# Patient Record
Sex: Female | Born: 1985
Health system: Southern US, Community
[De-identification: ages and names within clinical notes are randomized; demographics above are authoritative.]

## PROBLEM LIST (undated history)

## (undated) ENCOUNTER — Inpatient Hospital Stay (HOSPITAL_COMMUNITY): Payer: Self-pay

## (undated) DIAGNOSIS — F331 Major depressive disorder, recurrent, moderate: Secondary | ICD-10-CM

## (undated) DIAGNOSIS — B009 Herpesviral infection, unspecified: Secondary | ICD-10-CM

## (undated) DIAGNOSIS — Z87898 Personal history of other specified conditions: Secondary | ICD-10-CM

## (undated) DIAGNOSIS — I10 Essential (primary) hypertension: Secondary | ICD-10-CM

## (undated) DIAGNOSIS — N83209 Unspecified ovarian cyst, unspecified side: Secondary | ICD-10-CM

## (undated) DIAGNOSIS — I5189 Other ill-defined heart diseases: Secondary | ICD-10-CM

## (undated) DIAGNOSIS — R51 Headache: Secondary | ICD-10-CM

## (undated) DIAGNOSIS — N390 Urinary tract infection, site not specified: Secondary | ICD-10-CM

## (undated) DIAGNOSIS — B999 Unspecified infectious disease: Secondary | ICD-10-CM

## (undated) DIAGNOSIS — F4310 Post-traumatic stress disorder, unspecified: Secondary | ICD-10-CM

## (undated) DIAGNOSIS — R87619 Unspecified abnormal cytological findings in specimens from cervix uteri: Secondary | ICD-10-CM

## (undated) DIAGNOSIS — Z8751 Personal history of pre-term labor: Secondary | ICD-10-CM

## (undated) DIAGNOSIS — IMO0002 Reserved for concepts with insufficient information to code with codable children: Secondary | ICD-10-CM

## (undated) HISTORY — DX: Major depressive disorder, recurrent, moderate: F33.1

## (undated) HISTORY — DX: Personal history of pre-term labor: Z87.51

## (undated) HISTORY — DX: Personal history of other specified conditions: Z87.898

## (undated) HISTORY — PX: WISDOM TOOTH EXTRACTION: SHX21

## (undated) HISTORY — DX: Essential (primary) hypertension: I10

## (undated) HISTORY — PX: TONSILLECTOMY: SUR1361

---
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
## 2007-01-04 ENCOUNTER — Ambulatory Visit: Payer: Self-pay | Admitting: Sports Medicine

## 2007-01-28 ENCOUNTER — Inpatient Hospital Stay (HOSPITAL_COMMUNITY): Admission: AD | Admit: 2007-01-28 | Discharge: 2007-01-28 | Payer: Self-pay | Admitting: Obstetrics and Gynecology

## 2007-01-28 DIAGNOSIS — F4310 Post-traumatic stress disorder, unspecified: Secondary | ICD-10-CM | POA: Insufficient documentation

## 2007-01-28 DIAGNOSIS — F909 Attention-deficit hyperactivity disorder, unspecified type: Secondary | ICD-10-CM | POA: Insufficient documentation

## 2007-01-28 DIAGNOSIS — O99210 Obesity complicating pregnancy, unspecified trimester: Secondary | ICD-10-CM

## 2007-01-28 HISTORY — DX: Post-traumatic stress disorder, unspecified: F43.10

## 2007-03-26 ENCOUNTER — Encounter (INDEPENDENT_AMBULATORY_CARE_PROVIDER_SITE_OTHER): Payer: Self-pay | Admitting: Family Medicine

## 2007-03-26 ENCOUNTER — Other Ambulatory Visit: Admission: RE | Admit: 2007-03-26 | Discharge: 2007-03-26 | Payer: Self-pay | Admitting: Family Medicine

## 2007-03-26 ENCOUNTER — Ambulatory Visit: Payer: Self-pay | Admitting: Family Medicine

## 2007-03-26 LAB — CONVERTED CEMR LAB
Pap Smear: NEGATIVE
Whiff Test: NEGATIVE

## 2007-03-27 ENCOUNTER — Encounter (INDEPENDENT_AMBULATORY_CARE_PROVIDER_SITE_OTHER): Payer: Self-pay | Admitting: Family Medicine

## 2007-04-03 ENCOUNTER — Encounter (INDEPENDENT_AMBULATORY_CARE_PROVIDER_SITE_OTHER): Payer: Self-pay | Admitting: Family Medicine

## 2007-05-26 ENCOUNTER — Ambulatory Visit: Payer: Self-pay | Admitting: Family Medicine

## 2007-06-10 ENCOUNTER — Telehealth: Payer: Self-pay | Admitting: *Deleted

## 2007-06-11 ENCOUNTER — Encounter (INDEPENDENT_AMBULATORY_CARE_PROVIDER_SITE_OTHER): Payer: Self-pay | Admitting: Family Medicine

## 2007-06-11 ENCOUNTER — Ambulatory Visit: Payer: Self-pay | Admitting: Family Medicine

## 2007-06-11 LAB — CONVERTED CEMR LAB
Bilirubin Urine: NEGATIVE
Glucose, Urine, Semiquant: NEGATIVE
Urobilinogen, UA: 0.2
Whiff Test: NEGATIVE

## 2007-06-14 ENCOUNTER — Encounter (INDEPENDENT_AMBULATORY_CARE_PROVIDER_SITE_OTHER): Payer: Self-pay | Admitting: Family Medicine

## 2007-09-08 ENCOUNTER — Telehealth (INDEPENDENT_AMBULATORY_CARE_PROVIDER_SITE_OTHER): Payer: Self-pay | Admitting: *Deleted

## 2007-09-09 ENCOUNTER — Encounter (INDEPENDENT_AMBULATORY_CARE_PROVIDER_SITE_OTHER): Payer: Self-pay | Admitting: Family Medicine

## 2007-09-09 ENCOUNTER — Ambulatory Visit: Payer: Self-pay | Admitting: Family Medicine

## 2007-09-09 ENCOUNTER — Telehealth: Payer: Self-pay | Admitting: *Deleted

## 2007-09-09 ENCOUNTER — Encounter (INDEPENDENT_AMBULATORY_CARE_PROVIDER_SITE_OTHER): Payer: Self-pay | Admitting: *Deleted

## 2007-09-09 LAB — CONVERTED CEMR LAB: KOH Prep: NEGATIVE

## 2007-09-11 LAB — CONVERTED CEMR LAB

## 2007-09-13 ENCOUNTER — Telehealth (INDEPENDENT_AMBULATORY_CARE_PROVIDER_SITE_OTHER): Payer: Self-pay | Admitting: *Deleted

## 2007-09-13 ENCOUNTER — Ambulatory Visit: Payer: Self-pay | Admitting: Family Medicine

## 2007-10-25 ENCOUNTER — Ambulatory Visit: Payer: Self-pay | Admitting: Family Medicine

## 2007-10-25 LAB — CONVERTED CEMR LAB: Beta hcg, urine, semiquantitative: NEGATIVE

## 2007-10-26 ENCOUNTER — Encounter (INDEPENDENT_AMBULATORY_CARE_PROVIDER_SITE_OTHER): Payer: Self-pay | Admitting: Family Medicine

## 2007-10-26 ENCOUNTER — Ambulatory Visit: Payer: Self-pay | Admitting: Family Medicine

## 2007-10-26 LAB — CONVERTED CEMR LAB: GC Probe Amp, Genital: NEGATIVE

## 2007-10-27 ENCOUNTER — Telehealth: Payer: Self-pay | Admitting: *Deleted

## 2007-11-01 ENCOUNTER — Encounter (INDEPENDENT_AMBULATORY_CARE_PROVIDER_SITE_OTHER): Payer: Self-pay | Admitting: Family Medicine

## 2007-11-03 ENCOUNTER — Encounter: Payer: Self-pay | Admitting: *Deleted

## 2007-11-16 ENCOUNTER — Inpatient Hospital Stay (HOSPITAL_COMMUNITY): Admission: AD | Admit: 2007-11-16 | Discharge: 2007-11-16 | Payer: Self-pay | Admitting: Obstetrics & Gynecology

## 2008-02-08 ENCOUNTER — Ambulatory Visit: Payer: Self-pay | Admitting: Family Medicine

## 2008-02-08 ENCOUNTER — Encounter (INDEPENDENT_AMBULATORY_CARE_PROVIDER_SITE_OTHER): Payer: Self-pay | Admitting: Family Medicine

## 2008-02-08 LAB — CONVERTED CEMR LAB
Beta hcg, urine, semiquantitative: NEGATIVE
Whiff Test: POSITIVE

## 2008-02-09 ENCOUNTER — Ambulatory Visit: Payer: Self-pay | Admitting: Family Medicine

## 2008-02-09 LAB — CONVERTED CEMR LAB
Chlamydia, DNA Probe: POSITIVE — AB
GC Probe Amp, Genital: POSITIVE — AB

## 2008-03-06 ENCOUNTER — Encounter: Payer: Self-pay | Admitting: *Deleted

## 2008-03-06 ENCOUNTER — Ambulatory Visit: Payer: Self-pay | Admitting: Family Medicine

## 2008-03-27 ENCOUNTER — Encounter (INDEPENDENT_AMBULATORY_CARE_PROVIDER_SITE_OTHER): Payer: Self-pay | Admitting: Family Medicine

## 2008-03-27 ENCOUNTER — Other Ambulatory Visit: Admission: RE | Admit: 2008-03-27 | Discharge: 2008-03-27 | Payer: Self-pay | Admitting: Family Medicine

## 2008-03-27 ENCOUNTER — Ambulatory Visit: Payer: Self-pay | Admitting: Family Medicine

## 2008-03-27 LAB — CONVERTED CEMR LAB: GC Probe Amp, Genital: NEGATIVE

## 2008-03-30 LAB — CONVERTED CEMR LAB: Pap Smear: NORMAL

## 2008-04-04 ENCOUNTER — Encounter (INDEPENDENT_AMBULATORY_CARE_PROVIDER_SITE_OTHER): Payer: Self-pay | Admitting: Family Medicine

## 2008-04-27 ENCOUNTER — Ambulatory Visit: Payer: Self-pay | Admitting: Family Medicine

## 2008-05-01 ENCOUNTER — Ambulatory Visit: Payer: Self-pay | Admitting: Family Medicine

## 2008-05-08 ENCOUNTER — Encounter (INDEPENDENT_AMBULATORY_CARE_PROVIDER_SITE_OTHER): Payer: Self-pay | Admitting: *Deleted

## 2008-05-19 ENCOUNTER — Ambulatory Visit: Payer: Self-pay | Admitting: Family Medicine

## 2008-05-19 ENCOUNTER — Encounter (INDEPENDENT_AMBULATORY_CARE_PROVIDER_SITE_OTHER): Payer: Self-pay | Admitting: *Deleted

## 2008-05-19 ENCOUNTER — Telehealth: Payer: Self-pay | Admitting: *Deleted

## 2008-05-19 ENCOUNTER — Encounter: Payer: Self-pay | Admitting: Family Medicine

## 2008-06-02 ENCOUNTER — Encounter: Payer: Self-pay | Admitting: Family Medicine

## 2008-06-07 ENCOUNTER — Inpatient Hospital Stay (HOSPITAL_COMMUNITY): Admission: AD | Admit: 2008-06-07 | Discharge: 2008-06-07 | Payer: Self-pay | Admitting: Obstetrics & Gynecology

## 2008-06-08 ENCOUNTER — Encounter (INDEPENDENT_AMBULATORY_CARE_PROVIDER_SITE_OTHER): Payer: Self-pay | Admitting: Family Medicine

## 2008-06-08 ENCOUNTER — Ambulatory Visit: Payer: Self-pay | Admitting: Family Medicine

## 2008-06-08 LAB — CONVERTED CEMR LAB: hCG, Beta Chain, Quant, S: <2 milliintl units/mL

## 2008-06-09 ENCOUNTER — Encounter (INDEPENDENT_AMBULATORY_CARE_PROVIDER_SITE_OTHER): Payer: Self-pay | Admitting: Family Medicine

## 2008-07-03 ENCOUNTER — Encounter: Payer: Self-pay | Admitting: Family Medicine

## 2008-07-03 ENCOUNTER — Ambulatory Visit: Payer: Self-pay | Admitting: Family Medicine

## 2008-07-03 LAB — CONVERTED CEMR LAB
Glucose, Urine, Semiquant: NEGATIVE
Nitrite: NEGATIVE
Urobilinogen, UA: 0.2

## 2008-07-04 ENCOUNTER — Encounter: Payer: Self-pay | Admitting: Family Medicine

## 2008-08-22 ENCOUNTER — Ambulatory Visit: Payer: Self-pay | Admitting: Family Medicine

## 2008-08-22 LAB — CONVERTED CEMR LAB
Bilirubin Urine: NEGATIVE
Ketones, urine, test strip: NEGATIVE
Nitrite: NEGATIVE
Protein, U semiquant: NEGATIVE
pH: 6.5

## 2008-09-06 ENCOUNTER — Telehealth (INDEPENDENT_AMBULATORY_CARE_PROVIDER_SITE_OTHER): Payer: Self-pay | Admitting: *Deleted

## 2008-09-08 ENCOUNTER — Telehealth (INDEPENDENT_AMBULATORY_CARE_PROVIDER_SITE_OTHER): Payer: Self-pay | Admitting: *Deleted

## 2008-09-08 ENCOUNTER — Encounter: Payer: Self-pay | Admitting: *Deleted

## 2008-09-11 ENCOUNTER — Encounter: Payer: Self-pay | Admitting: Family Medicine

## 2008-09-11 ENCOUNTER — Ambulatory Visit: Payer: Self-pay | Admitting: Family Medicine

## 2008-09-11 LAB — CONVERTED CEMR LAB
Beta hcg, urine, semiquantitative: NEGATIVE
Blood in Urine, dipstick: NEGATIVE
Glucose, Urine, Semiquant: NEGATIVE
Nitrite: NEGATIVE
Specific Gravity, Urine: 1.02
WBC Urine, dipstick: NEGATIVE

## 2008-10-02 ENCOUNTER — Encounter: Payer: Self-pay | Admitting: Family Medicine

## 2008-10-02 ENCOUNTER — Ambulatory Visit: Payer: Self-pay | Admitting: Family Medicine

## 2008-10-02 LAB — CONVERTED CEMR LAB

## 2008-10-31 ENCOUNTER — Ambulatory Visit: Payer: Self-pay | Admitting: Family Medicine

## 2008-10-31 ENCOUNTER — Encounter: Payer: Self-pay | Admitting: Family Medicine

## 2008-10-31 LAB — CONVERTED CEMR LAB
Beta hcg, urine, semiquantitative: NEGATIVE
Chlamydia, DNA Probe: NEGATIVE
GC Probe Amp, Genital: NEGATIVE

## 2008-11-02 ENCOUNTER — Encounter: Payer: Self-pay | Admitting: Family Medicine

## 2008-11-06 ENCOUNTER — Telehealth: Payer: Self-pay | Admitting: *Deleted

## 2008-11-06 ENCOUNTER — Ambulatory Visit: Payer: Self-pay | Admitting: Family Medicine

## 2008-12-06 ENCOUNTER — Telehealth: Payer: Self-pay | Admitting: *Deleted

## 2008-12-06 ENCOUNTER — Ambulatory Visit: Payer: Self-pay | Admitting: Family Medicine

## 2008-12-06 LAB — CONVERTED CEMR LAB
Glucose, Urine, Semiquant: NEGATIVE
Nitrite: NEGATIVE
WBC Urine, dipstick: NEGATIVE
pH: 6.5

## 2008-12-07 ENCOUNTER — Encounter: Payer: Self-pay | Admitting: Family Medicine

## 2009-01-18 ENCOUNTER — Encounter: Payer: Self-pay | Admitting: Family Medicine

## 2009-01-18 ENCOUNTER — Ambulatory Visit: Payer: Self-pay | Admitting: Family Medicine

## 2009-01-18 LAB — CONVERTED CEMR LAB
Basophils Relative: 0 % (ref 0–1)
Beta hcg, urine, semiquantitative: POSITIVE
Bilirubin Urine: NEGATIVE
Eosinophils Absolute: 0.1 10*3/uL (ref 0.0–0.7)
Eosinophils Relative: 1 % (ref 0–5)
HCT: 36.3 % (ref 36.0–46.0)
Hepatitis B Surface Ag: NEGATIVE
MCHC: 33.1 g/dL (ref 30.0–36.0)
MCV: 96.5 fL (ref 78.0–100.0)
Neutrophils Relative %: 57 % (ref 43–77)
Platelets: 424 10*3/uL — ABNORMAL HIGH (ref 150–400)
Protein, U semiquant: 100
RDW: 12.9 % (ref 11.5–15.5)
Rh Type: NEGATIVE
Urobilinogen, UA: 1
WBC, UA: >20 cells/hpf
pH: 6.5

## 2009-01-19 ENCOUNTER — Encounter: Payer: Self-pay | Admitting: Family Medicine

## 2009-01-19 ENCOUNTER — Inpatient Hospital Stay (HOSPITAL_COMMUNITY): Admission: AD | Admit: 2009-01-19 | Discharge: 2009-01-19 | Payer: Self-pay | Admitting: Obstetrics & Gynecology

## 2009-01-26 ENCOUNTER — Inpatient Hospital Stay (HOSPITAL_COMMUNITY): Admission: RE | Admit: 2009-01-26 | Discharge: 2009-01-26 | Payer: Self-pay | Admitting: Family Medicine

## 2009-02-05 ENCOUNTER — Ambulatory Visit: Payer: Self-pay | Admitting: Family Medicine

## 2009-02-05 ENCOUNTER — Encounter: Payer: Self-pay | Admitting: *Deleted

## 2009-02-05 ENCOUNTER — Telehealth: Payer: Self-pay | Admitting: Family Medicine

## 2009-02-06 ENCOUNTER — Encounter: Payer: Self-pay | Admitting: Family Medicine

## 2009-02-26 ENCOUNTER — Telehealth: Payer: Self-pay | Admitting: *Deleted

## 2009-03-02 ENCOUNTER — Encounter: Payer: Self-pay | Admitting: Family Medicine

## 2009-03-02 ENCOUNTER — Other Ambulatory Visit: Admission: RE | Admit: 2009-03-02 | Discharge: 2009-03-02 | Payer: Self-pay | Admitting: Family Medicine

## 2009-03-02 ENCOUNTER — Ambulatory Visit: Payer: Self-pay | Admitting: Family Medicine

## 2009-03-02 LAB — CONVERTED CEMR LAB
Cholesterol, target level: 200 mg/dL
GC Probe Amp, Genital: NEGATIVE
LDL Goal: 160 mg/dL

## 2009-03-06 LAB — CONVERTED CEMR LAB: Pap Smear: NORMAL

## 2009-03-07 ENCOUNTER — Telehealth: Payer: Self-pay | Admitting: Family Medicine

## 2009-03-21 ENCOUNTER — Telehealth: Payer: Self-pay | Admitting: Family Medicine

## 2009-04-02 ENCOUNTER — Ambulatory Visit: Payer: Self-pay | Admitting: Family Medicine

## 2009-04-02 DIAGNOSIS — O360990 Maternal care for other rhesus isoimmunization, unspecified trimester, not applicable or unspecified: Secondary | ICD-10-CM | POA: Insufficient documentation

## 2009-04-24 ENCOUNTER — Encounter: Payer: Self-pay | Admitting: Family Medicine

## 2009-04-24 ENCOUNTER — Ambulatory Visit (HOSPITAL_COMMUNITY): Admission: RE | Admit: 2009-04-24 | Discharge: 2009-04-24 | Payer: Self-pay | Admitting: Family Medicine

## 2009-05-04 ENCOUNTER — Encounter: Payer: Self-pay | Admitting: Family Medicine

## 2009-05-04 ENCOUNTER — Ambulatory Visit: Payer: Self-pay | Admitting: Family Medicine

## 2009-05-04 LAB — CONVERTED CEMR LAB
Chlamydia, DNA Probe: NEGATIVE
GC Probe Amp, Genital: NEGATIVE
Whiff Test: NEGATIVE

## 2009-05-08 ENCOUNTER — Encounter: Payer: Self-pay | Admitting: Family Medicine

## 2009-05-11 ENCOUNTER — Ambulatory Visit (HOSPITAL_COMMUNITY): Admission: RE | Admit: 2009-05-11 | Discharge: 2009-05-11 | Payer: Self-pay | Admitting: Family Medicine

## 2009-05-11 ENCOUNTER — Encounter: Payer: Self-pay | Admitting: Family Medicine

## 2009-05-28 ENCOUNTER — Inpatient Hospital Stay (HOSPITAL_COMMUNITY): Admission: AD | Admit: 2009-05-28 | Discharge: 2009-05-28 | Payer: Self-pay | Admitting: Obstetrics & Gynecology

## 2009-05-28 ENCOUNTER — Ambulatory Visit: Payer: Self-pay | Admitting: Physician Assistant

## 2009-05-31 ENCOUNTER — Encounter: Payer: Self-pay | Admitting: Family Medicine

## 2009-06-01 ENCOUNTER — Encounter: Payer: Self-pay | Admitting: Family Medicine

## 2009-06-01 ENCOUNTER — Ambulatory Visit: Payer: Self-pay | Admitting: Family Medicine

## 2009-06-01 LAB — CONVERTED CEMR LAB: Rapid Strep: POSITIVE

## 2009-06-27 ENCOUNTER — Encounter: Payer: Self-pay | Admitting: Family Medicine

## 2009-07-05 ENCOUNTER — Ambulatory Visit: Payer: Self-pay | Admitting: Family Medicine

## 2009-07-05 ENCOUNTER — Encounter: Payer: Self-pay | Admitting: Family Medicine

## 2009-07-05 LAB — CONVERTED CEMR LAB: Whiff Test: NEGATIVE

## 2009-07-06 LAB — CONVERTED CEMR LAB
Chlamydia, DNA Probe: NEGATIVE
Hemoglobin: 11.4 g/dL — ABNORMAL LOW (ref 12.0–15.0)
MCHC: 32.7 g/dL (ref 30.0–36.0)
MCV: 96.7 fL (ref 78.0–100.0)
RBC: 3.61 M/uL — ABNORMAL LOW (ref 3.87–5.11)
RDW: 13.2 % (ref 11.5–15.5)

## 2009-07-20 ENCOUNTER — Encounter: Payer: Self-pay | Admitting: Family Medicine

## 2009-07-20 ENCOUNTER — Ambulatory Visit: Payer: Self-pay | Admitting: Family Medicine

## 2009-07-20 LAB — CONVERTED CEMR LAB
Bilirubin Urine: NEGATIVE
Blood in Urine, dipstick: NEGATIVE
Nitrite: NEGATIVE
Protein, U semiquant: 30
Urobilinogen, UA: 0.2

## 2009-08-02 ENCOUNTER — Encounter: Payer: Self-pay | Admitting: Family Medicine

## 2009-08-02 ENCOUNTER — Ambulatory Visit: Payer: Self-pay | Admitting: Family Medicine

## 2009-08-02 DIAGNOSIS — A609 Anogenital herpesviral infection, unspecified: Secondary | ICD-10-CM

## 2009-08-23 ENCOUNTER — Encounter: Payer: Self-pay | Admitting: Family Medicine

## 2009-08-23 ENCOUNTER — Ambulatory Visit: Payer: Self-pay | Admitting: Family Medicine

## 2009-08-23 LAB — CONVERTED CEMR LAB: Chlamydia, DNA Probe: NEGATIVE

## 2009-08-31 ENCOUNTER — Ambulatory Visit: Payer: Self-pay | Admitting: Family Medicine

## 2009-08-31 DIAGNOSIS — F70 Mild intellectual disabilities: Secondary | ICD-10-CM

## 2009-09-04 ENCOUNTER — Inpatient Hospital Stay (HOSPITAL_COMMUNITY): Admission: AD | Admit: 2009-09-04 | Discharge: 2009-09-05 | Payer: Self-pay | Admitting: Obstetrics & Gynecology

## 2009-09-04 ENCOUNTER — Ambulatory Visit: Payer: Self-pay | Admitting: Advanced Practice Midwife

## 2009-09-06 ENCOUNTER — Ambulatory Visit: Payer: Self-pay | Admitting: Family Medicine

## 2009-09-13 ENCOUNTER — Encounter: Payer: Self-pay | Admitting: Family Medicine

## 2009-09-13 ENCOUNTER — Ambulatory Visit: Payer: Self-pay | Admitting: Family Medicine

## 2009-09-21 ENCOUNTER — Ambulatory Visit: Payer: Self-pay | Admitting: Family Medicine

## 2009-09-21 ENCOUNTER — Encounter: Payer: Self-pay | Admitting: Family Medicine

## 2009-09-22 ENCOUNTER — Inpatient Hospital Stay (HOSPITAL_COMMUNITY): Admission: AD | Admit: 2009-09-22 | Discharge: 2009-09-25 | Payer: Self-pay | Admitting: Family Medicine

## 2009-09-22 ENCOUNTER — Ambulatory Visit: Payer: Self-pay | Admitting: Family Medicine

## 2009-10-09 ENCOUNTER — Ambulatory Visit: Payer: Self-pay | Admitting: Family Medicine

## 2009-11-01 ENCOUNTER — Ambulatory Visit: Payer: Self-pay | Admitting: Family Medicine

## 2009-11-01 LAB — CONVERTED CEMR LAB: Whiff Test: NEGATIVE

## 2010-01-18 ENCOUNTER — Encounter: Payer: Self-pay | Admitting: Family Medicine

## 2010-01-18 ENCOUNTER — Ambulatory Visit: Payer: Self-pay | Admitting: Family Medicine

## 2010-01-18 LAB — CONVERTED CEMR LAB
Chlamydia, DNA Probe: NEGATIVE
GC Probe Amp, Genital: NEGATIVE

## 2010-01-20 ENCOUNTER — Encounter: Payer: Self-pay | Admitting: Family Medicine

## 2010-03-26 ENCOUNTER — Encounter: Payer: Self-pay | Admitting: Family Medicine

## 2010-03-26 ENCOUNTER — Ambulatory Visit: Payer: Self-pay | Admitting: Family Medicine

## 2010-03-26 LAB — CONVERTED CEMR LAB
Bilirubin Urine: NEGATIVE
Blood in Urine, dipstick: NEGATIVE
Chlamydia, DNA Probe: NEGATIVE
GC Probe Amp, Genital: NEGATIVE
Glucose, Urine, Semiquant: NEGATIVE
Nitrite: POSITIVE
Protein, U semiquant: NEGATIVE
Urobilinogen, UA: 0.2

## 2010-03-27 ENCOUNTER — Encounter: Payer: Self-pay | Admitting: Family Medicine

## 2010-04-01 ENCOUNTER — Inpatient Hospital Stay (HOSPITAL_COMMUNITY): Admission: AD | Admit: 2010-04-01 | Discharge: 2010-04-01 | Payer: Self-pay | Admitting: Obstetrics & Gynecology

## 2010-04-03 ENCOUNTER — Ambulatory Visit: Payer: Self-pay | Admitting: Family Medicine

## 2010-04-03 ENCOUNTER — Encounter: Payer: Self-pay | Admitting: Family Medicine

## 2010-04-03 LAB — CONVERTED CEMR LAB
Basophils Relative: 0 % (ref 0–1)
Eosinophils Absolute: 0.2 10*3/uL (ref 0.0–0.7)
Lymphs Abs: 3.1 10*3/uL (ref 0.7–4.0)
MCHC: 32.4 g/dL (ref 30.0–36.0)
MCV: 98.4 fL (ref 78.0–100.0)
Neutro Abs: 4.6 10*3/uL (ref 1.7–7.7)
Neutrophils Relative %: 56 % (ref 43–77)
Platelets: 476 10*3/uL — ABNORMAL HIGH (ref 150–400)
WBC: 8.2 10*3/uL (ref 4.0–10.5)

## 2010-04-04 ENCOUNTER — Encounter: Payer: Self-pay | Admitting: Family Medicine

## 2010-04-11 ENCOUNTER — Ambulatory Visit (HOSPITAL_COMMUNITY): Admission: RE | Admit: 2010-04-11 | Discharge: 2010-04-11 | Payer: Self-pay | Admitting: Obstetrics & Gynecology

## 2010-04-11 ENCOUNTER — Ambulatory Visit: Payer: Self-pay | Admitting: Obstetrics and Gynecology

## 2010-04-18 ENCOUNTER — Encounter: Payer: Self-pay | Admitting: Family Medicine

## 2010-04-18 ENCOUNTER — Ambulatory Visit: Payer: Self-pay | Admitting: Family Medicine

## 2010-04-18 DIAGNOSIS — T7431XA Adult psychological abuse, confirmed, initial encounter: Secondary | ICD-10-CM | POA: Insufficient documentation

## 2010-04-18 DIAGNOSIS — F331 Major depressive disorder, recurrent, moderate: Secondary | ICD-10-CM

## 2010-04-18 HISTORY — DX: Adult psychological abuse, confirmed, initial encounter: T74.31XA

## 2010-04-18 HISTORY — DX: Major depressive disorder, recurrent, moderate: F33.1

## 2010-04-25 ENCOUNTER — Ambulatory Visit: Payer: Self-pay | Admitting: Family Medicine

## 2010-04-25 ENCOUNTER — Other Ambulatory Visit: Admission: RE | Admit: 2010-04-25 | Discharge: 2010-04-25 | Payer: Self-pay | Admitting: Family Medicine

## 2010-04-25 LAB — CONVERTED CEMR LAB
Chlamydia, DNA Probe: NEGATIVE
GC Probe Amp, Genital: NEGATIVE
Whiff Test: NEGATIVE

## 2010-05-07 ENCOUNTER — Ambulatory Visit: Payer: Self-pay | Admitting: Family Medicine

## 2010-05-23 ENCOUNTER — Encounter: Payer: Self-pay | Admitting: Family Medicine

## 2010-05-23 ENCOUNTER — Ambulatory Visit: Payer: Self-pay | Admitting: Family Medicine

## 2010-05-23 LAB — CONVERTED CEMR LAB
Ketones, urine, test strip: NEGATIVE
Nitrite: NEGATIVE
Urobilinogen, UA: 0.2

## 2010-05-24 ENCOUNTER — Encounter: Payer: Self-pay | Admitting: Family Medicine

## 2010-05-24 LAB — CONVERTED CEMR LAB: Protein, Ur: 84 mg/24hr (ref 50–100)

## 2010-06-01 ENCOUNTER — Encounter: Payer: Self-pay | Admitting: Family Medicine

## 2010-06-05 ENCOUNTER — Ambulatory Visit: Payer: Self-pay | Admitting: Family Medicine

## 2010-06-05 ENCOUNTER — Encounter: Payer: Self-pay | Admitting: Family Medicine

## 2010-06-05 LAB — CONVERTED CEMR LAB
Chlamydia, DNA Probe: NEGATIVE
GC Probe Amp, Genital: NEGATIVE
Ketones, urine, test strip: NEGATIVE
Nitrite: NEGATIVE
Urobilinogen, UA: 0.2
Whiff Test: NEGATIVE

## 2010-06-07 ENCOUNTER — Ambulatory Visit: Payer: Self-pay | Admitting: Family Medicine

## 2010-06-07 ENCOUNTER — Encounter: Payer: Self-pay | Admitting: Family Medicine

## 2010-06-17 ENCOUNTER — Telehealth: Payer: Self-pay | Admitting: *Deleted

## 2010-06-20 ENCOUNTER — Ambulatory Visit: Payer: Self-pay | Admitting: Family Medicine

## 2010-06-27 ENCOUNTER — Ambulatory Visit: Payer: Self-pay | Admitting: Family Medicine

## 2010-07-05 ENCOUNTER — Ambulatory Visit (HOSPITAL_COMMUNITY): Admission: RE | Admit: 2010-07-05 | Discharge: 2010-07-05 | Payer: Self-pay | Admitting: Family Medicine

## 2010-07-05 ENCOUNTER — Encounter: Payer: Self-pay | Admitting: Family Medicine

## 2010-07-10 ENCOUNTER — Encounter: Payer: Self-pay | Admitting: Family Medicine

## 2010-07-10 ENCOUNTER — Ambulatory Visit: Payer: Self-pay | Admitting: Family Medicine

## 2010-07-10 LAB — CONVERTED CEMR LAB
Bilirubin Urine: NEGATIVE
Blood in Urine, dipstick: NEGATIVE
Glucose, Urine, Semiquant: NEGATIVE
Ketones, urine, test strip: NEGATIVE
Nitrite: NEGATIVE
Urobilinogen, UA: 0.2
Whiff Test: NEGATIVE

## 2010-07-12 ENCOUNTER — Telehealth: Payer: Self-pay | Admitting: *Deleted

## 2010-07-12 ENCOUNTER — Encounter: Payer: Self-pay | Admitting: Family Medicine

## 2010-07-30 ENCOUNTER — Encounter: Payer: Self-pay | Admitting: Family Medicine

## 2010-08-29 ENCOUNTER — Encounter: Payer: Self-pay | Admitting: Family Medicine

## 2010-08-29 ENCOUNTER — Ambulatory Visit: Payer: Self-pay | Admitting: Family Medicine

## 2010-08-29 DIAGNOSIS — O34219 Maternal care for unspecified type scar from previous cesarean delivery: Secondary | ICD-10-CM

## 2010-08-29 LAB — CONVERTED CEMR LAB
Antibody Screen: NEGATIVE
HCT: 33.3 % — ABNORMAL LOW (ref 36.0–46.0)
Hemoglobin: 10.8 g/dL — ABNORMAL LOW (ref 12.0–15.0)
MCHC: 32.4 g/dL (ref 30.0–36.0)
RDW: 13.5 % (ref 11.5–15.5)

## 2010-09-02 ENCOUNTER — Telehealth: Payer: Self-pay | Admitting: Family Medicine

## 2010-09-11 ENCOUNTER — Ambulatory Visit: Payer: Self-pay | Admitting: Family Medicine

## 2010-09-11 LAB — CONVERTED CEMR LAB
Blood in Urine, dipstick: NEGATIVE
Specific Gravity, Urine: 1.02
pH: 7

## 2010-09-12 ENCOUNTER — Encounter: Payer: Self-pay | Admitting: Family Medicine

## 2010-09-12 LAB — CONVERTED CEMR LAB
ALT: 12 units/L (ref 0–35)
AST: 12 units/L (ref 0–37)
Alkaline Phosphatase: 73 units/L (ref 39–117)
CO2: 22 meq/L (ref 19–32)
MCHC: 31.9 g/dL (ref 30.0–36.0)
MCV: 99.7 fL (ref 78.0–100.0)
Platelets: 376 10*3/uL (ref 150–400)
RDW: 14.1 % (ref 11.5–15.5)
Sodium: 137 meq/L (ref 135–145)
Total Bilirubin: 0.4 mg/dL (ref 0.3–1.2)
Total Protein: 6.5 g/dL (ref 6.0–8.3)
WBC: 9.8 10*3/uL (ref 4.0–10.5)

## 2010-09-13 ENCOUNTER — Ambulatory Visit: Payer: Self-pay | Admitting: Family Medicine

## 2010-09-14 ENCOUNTER — Encounter: Payer: Self-pay | Admitting: Family Medicine

## 2010-09-16 ENCOUNTER — Encounter: Payer: Self-pay | Admitting: Family Medicine

## 2010-09-25 ENCOUNTER — Ambulatory Visit: Payer: Self-pay | Admitting: Family Medicine

## 2010-10-08 ENCOUNTER — Encounter: Payer: Self-pay | Admitting: Family Medicine

## 2010-10-10 ENCOUNTER — Ambulatory Visit: Payer: Self-pay | Admitting: Family Medicine

## 2010-10-28 ENCOUNTER — Inpatient Hospital Stay (HOSPITAL_COMMUNITY)
Admission: AD | Admit: 2010-10-28 | Discharge: 2010-10-28 | Payer: Self-pay | Source: Home / Self Care | Admitting: Obstetrics & Gynecology

## 2010-10-28 ENCOUNTER — Encounter: Payer: Self-pay | Admitting: Family Medicine

## 2010-10-28 ENCOUNTER — Ambulatory Visit: Payer: Self-pay | Admitting: Family Medicine

## 2010-11-07 ENCOUNTER — Encounter: Payer: Self-pay | Admitting: Family Medicine

## 2010-11-07 ENCOUNTER — Ambulatory Visit: Payer: Self-pay | Admitting: Family Medicine

## 2010-11-07 LAB — CONVERTED CEMR LAB
GC Probe Amp, Genital: NEGATIVE
Whiff Test: NEGATIVE

## 2010-11-08 ENCOUNTER — Encounter: Payer: Self-pay | Admitting: Family Medicine

## 2010-11-12 ENCOUNTER — Ambulatory Visit: Payer: Self-pay

## 2010-11-12 ENCOUNTER — Encounter: Payer: Self-pay | Admitting: Family Medicine

## 2010-11-19 ENCOUNTER — Ambulatory Visit: Payer: Self-pay | Admitting: Family Medicine

## 2010-11-22 ENCOUNTER — Inpatient Hospital Stay (HOSPITAL_COMMUNITY)
Admission: AD | Admit: 2010-11-22 | Discharge: 2010-11-24 | Payer: Self-pay | Source: Home / Self Care | Attending: Obstetrics and Gynecology | Admitting: Obstetrics and Gynecology

## 2010-11-27 ENCOUNTER — Ambulatory Visit: Payer: Self-pay

## 2010-12-01 ENCOUNTER — Emergency Department (HOSPITAL_COMMUNITY)
Admission: EM | Admit: 2010-12-01 | Discharge: 2010-12-01 | Payer: Self-pay | Source: Home / Self Care | Admitting: Emergency Medicine

## 2010-12-09 ENCOUNTER — Encounter: Payer: Self-pay | Admitting: Family Medicine

## 2010-12-09 ENCOUNTER — Ambulatory Visit: Admission: RE | Admit: 2010-12-09 | Discharge: 2010-12-09 | Payer: Self-pay | Source: Home / Self Care

## 2010-12-09 DIAGNOSIS — H0019 Chalazion unspecified eye, unspecified eyelid: Secondary | ICD-10-CM | POA: Insufficient documentation

## 2010-12-09 DIAGNOSIS — M6740 Ganglion, unspecified site: Secondary | ICD-10-CM | POA: Insufficient documentation
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
## 2010-12-31 NOTE — Miscellaneous (Signed)
Summary: walk in  Clinical Lists Changes walked in asking to reschedule her appt. she had missed the last one. states her child missed the bus & she had to drive her there. concerned that she still has nausea at this time of her pregnancy. states the pills do not work. discussed other methods such as [employer], vit B6. rescheduled her appt with pcp for next Thursday.Golden Circle RN  July 30, 2010 9:32 AM NEW PHONE [phone].Golden Circle RN  July 30, 2010 9:35 AM

## 2010-12-31 NOTE — Letter (Signed)
Summary: Handout Printed  Printed Handout:  - Prenatal-Record-CCC 

## 2010-12-31 NOTE — Letter (Signed)
Summary: Generic Letter  [facility]  [address]   [place], [place] [address]   Phone: [phone]  Fax: [phone]    03/27/2010  [facility] [address] [name], [place]  [address]  Dear [name],  I wanted to let you know that your HIV, syphilis, gonorrhea and chlamydia tests all came back normal. Nice to see you yesterday.  Sincerely,   Myrtie Soman  MD  Appended Document: Generic Letter mailed.
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
## 2010-12-31 NOTE — Letter (Signed)
Summary: PHQ-9  PHQ-9   Imported By: De Nurse 09/18/2010 12:25:28  _____________________________________________________________________  External Attachment:    Type:   Image     Comment:   External Document
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
## 2010-12-31 NOTE — Letter (Signed)
Summary: Generic Letter  [facility]  [address]   [place], [place] [address]   Phone: [phone]  Fax: [phone]    06/01/2010  BETTYANN BIRCHLER [address] [place], [place]  [address]  Dear Ms. Littlepage,  You missed your appointment for the diabetes test.  It is very importantfor you to test for diabetes. Please call the clinic to reschedule your appointment.   Call [phone].      Sincerely,   Clementeen Graham MD  Appended Document: Generic Letter mailed
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
## 2010-12-31 NOTE — Miscellaneous (Signed)
Summary: [facility]  [facility]   Imported By: De Nurse 09/05/2010 14:37:21  _____________________________________________________________________  External Attachment:    Type:   Image     Comment:   External Document

## 2010-12-31 NOTE — Assessment & Plan Note (Signed)
Summary: vaginal discharge/contraception,df   Vital Signs:  Patient profile:   25 year old female Height:      61.5 inches Temp:     98.3 degrees F oral Pulse rate:   78 / minute BP sitting:   132 / 73  (left arm) Cuff size:   large  Vitals Entered By: Gladstone Pih (January 18, 2010 10:32 AM) CC: C/O Vaginal itching, discharge, odor Is Patient Diabetic? No Pain Assessment Patient in pain? no        Primary Care Provider:  Myrtie Soman  MD  CC:  C/O Vaginal itching, discharge, and odor.  History of Present Illness: 25 yo G2P2 who delivered in Dec 2010 here for concerns of pregnancy and vaginal infection.  FOB has been unfaithful.  Has noticed some vaginal itching, thick white discharge, fishy odor, abd cramping.  States she had her last period in early February but it was much shorter than normal and is worried about pregnancy as she has not been usuing protection.  Last had STD over 3 years ago.  Brestfeeding going well.  Wants to talk about contraception as she does not desire pregnancy and her FOB does.  Has used Depo in the past.  SHe states she does not do well with pills?.   Discussed Mirena and may be interested.  Last sexual intercourse this AM.     Habits & Providers  Alcohol-Tobacco-Diet     Tobacco Status: never  Allergies: No Known Drug Allergies PMH-FH-SH reviewed-no changes except otherwise noted  Review of Systems      See HPI General:  Denies fever and weight loss. GI:  Denies abdominal pain. GU:  Complains of discharge; denies abnormal vaginal bleeding, dysuria, genital sores, and urinary frequency.  Physical Exam  General:  alert, obese, no acute distress, well hydrated, well developed, and well nourished.   Genitalia:  Pelvic Exam:  fishy odor.        External: normal female genitalia without lesions or masses        Vagina: normal without lesions or masses.  Thin white discharge.        Cervix: normal without lesions or masses.  No CMT.       Adnexa: normal bimanual exam without masses or fullness        Uterus: normal by palpation        Pap smear: not performed   Impression & Recommendations:  Problem # 1:  CONTRACEPTIVE MANAGEMENT (ICD-V25.09) U preg negative.  Discussed contraceptive options of Mirena vs Depo.  Patient given Mirena pamphlet.  Patient will think on it and make appt next week for one or the other.  Orders: U Preg-[facility] (81025) [facility]- Est Level  3 (40981)  Problem # 2:  VAGINAL DISCHARGE (ICD-623.5)  Treat with flagyl for BV.  Given exposure also tested for HIV, RPR.  GC/Chlamydia pendig.  Orders: [facility]- Est Level  3 (19147)  Complete Medication List: 1)  Flonase 50 Mcg/act Susp (Fluticasone propionate) .Marland Kitchen.. 1-2 sprays in each nostril daily until first frost. 2)  Ra Childrens Chewable Vit/iron 15 Mg Chew (Pediatric multivitamins-iron) .... 2 by mouth daily during pregnancy 3)  Colace 100 Mg Caps (Docusate sodium) .... One by mouth two times a day 4)  Acyclovir 400 Mg Tabs (Acyclovir) .... Sig: take 1 tab by mouth three times daily 5)  Promethazine Hcl 12.5 Mg Tabs (Promethazine hcl) .... Take one tab every 6 hours as needed for nausea 6)  Flagyl 500 Mg Tabs (Metronidazole) .Marland KitchenMarland KitchenMarland Kitchen  Take one tablet twice a day for 7 days  Other Orders: Wet Prep- [facility] [phone]) GC/Chlamydia-[facility] (87591/87491) HIV-[facility] [phone]) RPR-[facility] [phone])  Patient Instructions: 1)  See Mirena pamphlet.  Make appt for Mirena or Depo shot after you decide. 2)  I will call you with abnormal test results.  If normal, I will send you a letter. Prescriptions: FLAGYL 500 MG TABS (METRONIDAZOLE) take one tablet twice a day for 7 days  #14 x 0   Entered and Authorized by:   Delbert Harness MD   Signed by:   Delbert Harness MD on 01/18/2010   Method used:   Electronically to        [facility] (retail)       [address]       [place], [place]  [id number]       Ph: [phone]       Fax: [phone]   RxID:    [id number]   Laboratory Results   Urine Tests  Date/Time Received: January 18, 2010 11:24 AM  Date/Time Reported: January 18, 2010 11:30 AM     Urine HCG: negative Comments: ...............test performed by......Marland KitchenBonnie A. [place], MLS (ASCP)cm  Date/Time Received: January 18, 2010 11:24 AM  Date/Time Reported: January 18, 2010 11:52 AM   [place] Source: vag WBC/hpf: 4-8 Bacteria/hpf: 2+  Cocci Clue cells/hpf: moderate Yeast/hpf: none Trichomonas/hpf: none Comments: sperm present ...............test performed by......Marland KitchenBonnie A. [place], MLS (ASCP)cm
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
## 2010-12-31 NOTE — Letter (Signed)
Summary: Results Follow-up Letter  [facility]  [address]   [place], [place] [address]   Phone: [phone]  Fax: [phone]    01/20/2010  [id number] [place], [place]  [address]  Dear Ms. Vig,   The following are the results of your recent test(s):  Your recent testing for gonorrhea, chlamydia, HIV, and syphillis are negative.  Please let us know if you  have further questions.  Sincerely,  Delbert Harness MD [facility]           Appended Document: Results Follow-up Letter mailed.
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
## 2010-12-31 NOTE — Miscellaneous (Signed)
  Clinical Lists Changes  Problems: Removed problem of VAGINAL DISCHARGE (ICD-623.5) Removed problem of CONTACT OR EXPOSURE TO OTHER VIRAL DISEASES (ICD-V01.79) Removed problem of SCREENING FOR MALIGNANT NEOPLASM OF THE CERVIX (ICD-V76.2) Removed problem of HIGH-RISK SEXUAL BEHAVIOR (ICD-V69.2)
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
## 2010-12-31 NOTE — Progress Notes (Signed)
  Phone Note Outgoing Call   Call placed by: Clementeen Graham MD,  September 02, 2010 11:59 AM Summary of Call: Called and left a message about test results.  Dont know which pharmacy Ms Evan's uses. keflex Rx [facility] [address].  Please inform pt about this if she calls back.    New/Updated Medications: KEFLEX 500 MG CAPS (CEPHALEXIN) 1 by mouth two times a day x 7 days Prescriptions: KEFLEX 500 MG CAPS (CEPHALEXIN) 1 by mouth two times a day x 7 days  #14 x 0   Entered and Authorized by:   Clementeen Graham MD   Signed by:   Clementeen Graham MD on 09/02/2010   Method used:   Electronically to        [facility] (retail)       [address]       [place], [place]  [id number]       Ph: [phone]       Fax: [phone]   RxID:   [id number]   Appended Document:  pt notified of results and where rx sent in to.
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
## 2010-12-31 NOTE — Assessment & Plan Note (Signed)
Summary: uti s/s x 10 days/[place]   Vital Signs:  Patient profile:   25 year old female Height:      61.5 inches Weight:      212 pounds BMI:     39.55 Temp:     98.4 degrees F oral Pulse rate:   91 / minute BP sitting:   127 / 80  (left arm) Cuff size:   large  Vitals Entered By: Garen Grams LPN, (March 26, 2010 10:46 AM) CC: dysuria, vag itching/burning Is Patient Diabetic? No Pain Assessment Patient in pain? no        Primary Care Provider:  Myrtie Soman  MD  CC:  dysuria and vag itching/burning.  History of Present Illness: 1. vaginal itching burning Has had burning on urination intermittently for 2-3 weeks. Has had vaginal itching and burning with discharge that has a fishy odor over this time as well. Has had increased urination along with her dysuria.  fevers: no    chills: no    nausea: no    vomiting: no    diarrhea: no      2. Contraception recent unprotected sex?: yes but recently had her period Smoking?: no history of blood clots?:no previous contraception: intermittent -- pills and depot subjective: would be interested in the patch for birth control.   PMHx: significant for multiple STIs.  Habits & Providers  Alcohol-Tobacco-Diet     Tobacco Status: never  Current Medications (verified): 1)  Ortho Evra 150-20 Mcg/24hr Ptwk (Norelgestromin-Eth Estradiol) .... Apply One Patch Every Week For 3 Weeks, Then Off For One Week  Allergies (verified): No Known Drug Allergies  Review of Systems       review of systems as noted in HPI section   Physical Exam  General:  Vital signs reviewed -- obese but otherwise normal Alert, appropriate; well-dressed and well-nourished  Genitalia:  Pelvic Exam:        External: normal female genitalia without lesions or masses        Vagina: moderate white discharge        Cervix: normal without lesions or masses        Adnexa: normal bimanual exam without masses or fullness        Uterus: normal by palpation  Pap smear: not performed Skin:  warm, good turgor; no rashes or lesions.    Impression & Recommendations:  Problem # 1:  VAGINAL DISCHARGE (ICD-623.5) Assessment Deteriorated  wet prep and UA today; UA suspicious for infection. Will treat with keflex for 7 days and send for culture. Wet prep + for trich. One time dose of flagyl. Few yeast -- will treat that as well. Spent a long time counseling pt on safe sex and the need to use protection 100% of the time. GC/chlamydia, HIV, RPR pending.   [phone] GC/Chlamydia-[facility] (87591/87491) [facility]- Est  Level 4 (78295)  Her updated medication list for this problem includes:    Miconazole 3 200 Mg Supp (Miconazole nitrate) ..... One applicatorful per vagina at night for 3 days;  Problem # 2:  CONTRACEPTIVE MANAGEMENT (ICD-V25.09) Assessment: Unchanged  rx given for patch. Spent a long time counseling the patient on the importance of using birth control if she's sexually active to prevent another pregnancy. She expresses understanding.  Orders: [facility]- Est  Level 4 (99214)  Complete Medication List: 1)  Ortho Evra 150-20 Mcg/24hr Ptwk (Norelgestromin-eth estradiol) .... Apply one patch every week for 3 weeks, then off for one week 2)  Keflex 500 Mg Caps (Cephalexin) .... One by mouth two times a day for 7 days 3)  Flagyl 500 Mg Tabs (Metronidazole) .... Take 4 tablets by mouth all at one time; then stop 4)  Miconazole 3 200 Mg Supp (Miconazole nitrate) .... One applicatorful per vagina at night for 3 days;  Other Orders: Urinalysis-[facility] (00000) Urine Culture-[facility] ([id number]) HIV-[facility] ([address]11-91478) RPR-[facility] [phone]  Patient Instructions: 1)  start using the birth control patch 2)  you have an STD called trichamonas -- you get this by having unprotected sex. Make sure you use protection (condoms) every time you have sex. It only takes one mistake to get HIV or another disease. 3)  you also have a yeast infection -- use  the miconazole in the vagina for 3 nights to treat this 4)  you also have evidence of a urinary tract infection -- take the keflex two times a day for 7 days Prescriptions: MICONAZOLE 3 200 MG SUPP (MICONAZOLE NITRATE) one applicatorful per vagina at night for 3 days;  #3 doses x 0   Entered and Authorized by:   Myrtie Soman  MD   Signed by:   Myrtie Soman  MD on 03/26/2010   Method used:   Electronically to        [facility] (retail)       [address]       [place], [place]  [id number]       Ph: [phone]       Fax: [phone]   RxID:   [id number] FLAGYL 500 MG TABS (METRONIDAZOLE) take 4 tablets by mouth all at one time; then stop  #4 x 0   Entered and Authorized by:   Myrtie Soman  MD   Signed by:   Myrtie Soman  MD on 03/26/2010   Method used:   Electronically to        [facility] (retail)       [address]       [place], [place]  [address]       Ph: [phone]       Fax: [phone]   RxID:   [id number] KEFLEX 500 MG CAPS (CEPHALEXIN) one by mouth two times a day for 7 days  #14 x 0   Entered and Authorized by:   Myrtie Soman  MD   Signed by:   Myrtie Soman  MD on 03/26/2010   Method used:   Electronically to        [facility] ([facility]ail)       [address]       [place], [place]  [address]       Ph: [phone]       Fax: [phone]   RxID:   [id number] ORTHO EVRA 150-20 MCG/24HR PTWK (NORELGESTROMIN-ETH ESTRADIOL) apply one patch every week for 3 weeks, then off for one week  #3 patches x 11   Entered and Authorized by:   Myrtie Soman  MD   Signed by:   Myrtie Soman  MD on 03/26/2010   Method used:   Electronically to        [facility] ([facility]ail)       [address]       [place], [place]  [address]       Ph: [phone]       Fax: [phone]   RxID:   [id number]   Laboratory Results   Urine  Tests  Date/Time  Received: March 26, 2010 10:42 AM  Date/Time Reported: March 26, 2010 11:14 AM   Routine Urinalysis   Color: yellow Appearance: Clear Glucose: negative   (Normal Range: Negative) Bilirubin: negative   (Normal Range: Negative) Ketone: negative   (Normal Range: Negative) Spec. Gravity: 1.020   (Normal Range: 1.003-1.035) Blood: negative   (Normal Range: Negative) pH: 6.5   (Normal Range: 5.0-8.0) Protein: negative   (Normal Range: Negative) Urobilinogen: 0.2   (Normal Range: 0-1) Nitrite: positive   (Normal Range: Negative) Leukocyte Esterace: moderate   (Normal Range: Negative)  Urine Microscopic WBC/HPF: 5-10 RBC/HPF: rare Bacteria/HPF: 3+ Epithelial/HPF: 5-10 Yeast/HPF: few Other: few trich    Comments: urine sent for culture...........test performed by...........Marland KitchenTerese Door, CMA  Date/Time Received: March 26, 2010 11:25 AM  Date/Time Reported: March 26, 2010 11:44 AM   [place] Source: vaginal WBC/hpf: 10-20 Bacteria/hpf: 3+ Clue cells/hpf: none Yeast/hpf: few Trichomonas/hpf: few Comments: ...........test performed by...........Marland KitchenTerese Door, CMA

## 2010-12-31 NOTE — Miscellaneous (Signed)
Summary: walk in  Clinical Lists Changes came in c/o burning,itching & pain with urination x 10 days. also states she has a fishy odor. placed in her pcp sched for 11am..Sally Mark [facility] RN  March 26, 2010 10:36 AM

## 2010-12-31 NOTE — Assessment & Plan Note (Signed)
Summary: pregnancy test & see Dr.,tcb   Vital Signs:  Patient profile:   25 year old female Height:      61.5 inches Weight:      211.3 pounds BMI:     39.42 Temp:     98.6 degrees F oral Pulse rate:   66 / minute BP sitting:   107 / 75  (left arm) Cuff size:   large  Vitals Entered By: Garen Grams LPN (Apr 03, 1609 8:39 AM) CC: pregnancy test Is Patient Diabetic? No Pain Assessment Patient in pain? no        Primary Care Provider:  Myrtie Soman  MD  CC:  pregnancy test.  History of Present Illness: 1. + preg test  Pt states she had her period in the 3rd or 4th week of March (although 4/26 she told me that she had just recently had her period). Has had unprotected sex but didn't think she would get pregnant. States that she just thought her periods had become irregular.   Pt notes increased irritability, increased urinary frequency (although recent UTI).   States she knows who the father is and that he "wanted another one."  Habits & Providers  Alcohol-Tobacco-Diet     Tobacco Status: never  Current Medications (verified): 1)  Prenatal Vitamins 0.8 Mg Tabs (Prenatal Multivit-Min-Fe-Fa) .... One By Mouth Daily  Allergies (verified): No Known Drug Allergies  Review of Systems       10-point review of systems is negative except as noted in HPI.    Physical Exam  General:  Vital signs reviewed -- obese but otherwise normal Alert, appropriate; well-dressed and well-nourished  Lungs:  work of breathing unlabored, clear to auscultation bilaterally; no wheezes, rales, or ronchi; good air movement throughout  Heart:  regular rate and rhythm, no murmurs; normal s1/s2  Skin:  warm, good turgor; no rashes or lesions.    Impression & Recommendations:  Problem # 1:  SUPERVISION OF OTHER NORMAL PREGNANCY (ICD-V22.1) Assessment New Pt with a 75 month-old child and a 59 yo. Still breastfeeding. Seems ambivalent about this news but states she will keep the pregnancy and  doesn't plan on adoption. FOB is the same as for you youngest (she thinks) and is involved. Had UTI last visit so will do TOC today. Plan initial OB visit soon with early glucola.  Prenatal panel today.  Orders: Urine Culture-[facility] ([id number]) Prenatal-[facility] ([id number]) [facility]- Est Level  3 (62130) HIV-[facility] ([id number])  Complete Medication List: 1)  Prenatal Vitamins 0.8 Mg Tabs (Prenatal multivit-min-fe-fa) .... One by mouth daily  Other Orders: U Preg-[facility] (95284)  Patient Instructions: 1)  start taking prenatal vitamins 2)  You have an ultraosound scheduled for 5/12 at [facility].  3)  follow-up soon for your initial OB appointment -- you will have another doctor for this pregnancy because I'll be leaving at the end of June. 4)  Make sure you're drinking plenty of fluids.  5)  We'll plan on doing the sugar test at your next [facility] appointment.  Prescriptions: PRENATAL VITAMINS 0.8 MG TABS (PRENATAL MULTIVIT-MIN-FE-FA) one by mouth daily  #100 x 11   Entered and Authorized by:   Myrtie Soman  MD   Signed by:   Myrtie Soman  MD on 04/03/2010   Method used:   Electronically to        [facility] [address])       [address]       [place], [place]  [id number]       Ph: [phone]       Fax: [phone]   RxID:   [id number]   Laboratory Results   Urine Tests  Date/Time Received: Apr 03, 2010 8:36 AM  Date/Time Reported: Apr 03, 2010 8:45 AM     Urine HCG: positive Comments: ...........test performed by...........Marland KitchenTerese Door, CMA
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
## 2010-12-31 NOTE — Miscellaneous (Signed)
Summary: Dental Procedure  Patient left letter from dentist requesting to know if patient can be given local anesth. to have a procedure done.  Letter placed in your box. [name] [name]  June 07, 2010 3:07 PM
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
## 2010-12-31 NOTE — Letter (Signed)
Summary: Generic Letter  [facility]  [address]   [place], [place] [address]   Phone: [phone]  Fax: [phone]    07/12/2010  [facility] [address] [place], [place]  [address]  Dear Ms. Obey,  It looks like you have a bladder infection. I called in some antibiotics at the [facility] on [address] for you to take. Take 1 pill two times a day for 1 week. Call me with questions. Let me know if you feel worse. I hope that you feel better.    Sincerely,   Clementeen Graham MD  Appended Document: Generic Letter mailed.

## 2010-12-31 NOTE — Letter (Signed)
Summary: Patient Health Questionnaire  Patient Health Questionnaire   Imported By: Clydell Hakim 04/29/2010 08:54:25  _____________________________________________________________________  External Attachment:    Type:   Image     Comment:   External Document

## 2010-12-31 NOTE — Letter (Signed)
Summary: Handout Printed  Printed Handout:  - Prenatal-Flowsheet-CCC 

## 2010-12-31 NOTE — Progress Notes (Signed)
----   Converted from flag ---- ---- 07/12/2010 6:17 AM, Clementeen Graham MD wrote: Please attempt to contact patient and inform her to pick up Rx for Keflex at [facility] for UTI.  Will also write and send letter as phones for her are unreliable.   Thanks, Evan ------------------------------  attemted to call pt, number has been disconnected. Dr Denyse Amass has already sent a letter to the pt informing her of UTI and Rx at pharmacy.
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
## 2011-01-02 NOTE — Miscellaneous (Signed)
Summary: Procedures Consent  Procedures Consent   Imported By: De Nurse 12/11/2010 14:41:00  _____________________________________________________________________  External Attachment:    Type:   Image     Comment:   External Document

## 2011-01-02 NOTE — Letter (Signed)
Summary: Handout Printed  Printed Handout:  - Prenatal-Flowsheet-CCC 

## 2011-01-02 NOTE — Letter (Signed)
Summary: Handout Printed  Printed Handout:  - Prenatal-Record-CCC 

## 2011-01-03 ENCOUNTER — Ambulatory Visit (INDEPENDENT_AMBULATORY_CARE_PROVIDER_SITE_OTHER): Payer: Medicaid Other | Admitting: Family Medicine

## 2011-01-03 ENCOUNTER — Encounter: Payer: Self-pay | Admitting: Family Medicine

## 2011-01-03 DIAGNOSIS — J029 Acute pharyngitis, unspecified: Secondary | ICD-10-CM | POA: Insufficient documentation

## 2011-01-03 DIAGNOSIS — Z30017 Encounter for initial prescription of implantable subdermal contraceptive: Secondary | ICD-10-CM

## 2011-01-03 DIAGNOSIS — Z3046 Encounter for surveillance of implantable subdermal contraceptive: Secondary | ICD-10-CM

## 2011-01-03 DIAGNOSIS — Z3009 Encounter for other general counseling and advice on contraception: Secondary | ICD-10-CM
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
## 2011-01-16 NOTE — Miscellaneous (Signed)
Summary: Procedures Consent  Procedures Consent   Imported By: De Nurse 01/07/2011 10:54:19  _____________________________________________________________________  External Attachment:    Type:   Image     Comment:   External Document

## 2011-02-10 LAB — CBC
HCT: 32.8 % — ABNORMAL LOW (ref 36.0–46.0)
Hemoglobin: 10.4 g/dL — ABNORMAL LOW (ref 12.0–15.0)
Hemoglobin: 10.6 g/dL — ABNORMAL LOW (ref 12.0–15.0)
MCH: 30 pg (ref 26.0–34.0)
MCH: 30.2 pg (ref 26.0–34.0)
MCHC: 32.3 g/dL (ref 30.0–36.0)
MCV: 92.7 fL (ref 78.0–100.0)
MCV: 92.9 fL (ref 78.0–100.0)
RBC: 3.44 MIL/uL — ABNORMAL LOW (ref 3.87–5.11)

## 2011-02-10 LAB — POCT I-STAT, CHEM 8
BUN: 15 mg/dL (ref 6–23)
Creatinine, Ser: 0.8 mg/dL (ref 0.4–1.2)
Sodium: 140 mEq/L (ref 135–145)
TCO2: 24 mmol/L (ref 0–100)

## 2011-02-10 LAB — RH IMMUNE GLOB WKUP(>/=20WKS)(NOT WOMEN'S HOSP): Unit division: 0

## 2011-02-10 LAB — URINALYSIS, ROUTINE W REFLEX MICROSCOPIC
Protein, ur: NEGATIVE mg/dL
Urobilinogen, UA: 0.2 mg/dL (ref 0.0–1.0)

## 2011-02-10 LAB — URINE MICROSCOPIC-ADD ON

## 2011-02-16 LAB — GLUCOSE, CAPILLARY: Glucose-Capillary: 85 mg/dL (ref 70–99)

## 2011-02-18 LAB — URINALYSIS, ROUTINE W REFLEX MICROSCOPIC
Hgb urine dipstick: NEGATIVE
Nitrite: NEGATIVE
Specific Gravity, Urine: >1.03 — ABNORMAL HIGH (ref 1.005–1.030)
Urobilinogen, UA: 1 mg/dL (ref 0.0–1.0)

## 2011-02-18 LAB — URINE MICROSCOPIC-ADD ON

## 2011-02-18 LAB — GC/CHLAMYDIA PROBE AMP, GENITAL: GC Probe Amp, Genital: NEGATIVE

## 2011-02-18 LAB — WET PREP, GENITAL: Clue Cells Wet Prep HPF POC: NONE SEEN

## 2011-03-06 LAB — CBC
Hemoglobin: 12.4 g/dL (ref 12.0–15.0)
RBC: 3.73 MIL/uL — ABNORMAL LOW (ref 3.87–5.11)
WBC: 9.7 10*3/uL (ref 4.0–10.5)

## 2011-03-09 LAB — GLUCOSE, CAPILLARY: Glucose-Capillary: 81 mg/dL (ref 70–99)

## 2011-03-10 LAB — URINALYSIS, ROUTINE W REFLEX MICROSCOPIC
Leukocytes, UA: NEGATIVE
Nitrite: NEGATIVE
Specific Gravity, Urine: 1.025 (ref 1.005–1.030)
pH: 6 (ref 5.0–8.0)

## 2011-03-10 LAB — GC/CHLAMYDIA PROBE AMP, GENITAL
Chlamydia, DNA Probe: NEGATIVE
GC Probe Amp, Genital: NEGATIVE

## 2011-03-10 LAB — URINE CULTURE

## 2011-03-10 LAB — URINE MICROSCOPIC-ADD ON

## 2011-03-10 LAB — WET PREP, GENITAL
Clue Cells Wet Prep HPF POC: NONE SEEN
Trich, Wet Prep: NONE SEEN

## 2011-03-12 LAB — GLUCOSE, CAPILLARY: Glucose-Capillary: 118 mg/dL — ABNORMAL HIGH (ref 70–99)

## 2011-03-18 LAB — URINALYSIS, ROUTINE W REFLEX MICROSCOPIC
Hgb urine dipstick: NEGATIVE
Ketones, ur: NEGATIVE mg/dL
Protein, ur: NEGATIVE mg/dL
Urobilinogen, UA: 0.2 mg/dL (ref 0.0–1.0)

## 2011-03-18 LAB — HCG, QUANTITATIVE, PREGNANCY: hCG, Beta Chain, Quant, S: 12269 m[IU]/mL — ABNORMAL HIGH (ref <5)

## 2011-03-18 LAB — WET PREP, GENITAL
Clue Cells Wet Prep HPF POC: NONE SEEN
Trich, Wet Prep: NONE SEEN

## 2011-03-18 LAB — CBC
MCV: 97 fL (ref 78.0–100.0)
Platelets: 340 10*3/uL (ref 150–400)
RDW: 13.2 % (ref 11.5–15.5)
WBC: 7.8 10*3/uL (ref 4.0–10.5)

## 2011-03-27 ENCOUNTER — Ambulatory Visit: Payer: Medicaid Other | Admitting: Family Medicine

## 2011-04-18 NOTE — Op Note (Signed)
NAME:  [name] [name], [name] [name]               ACCOUNT NO.:  [id number]   MEDICAL RECORD NO.:  [id number]          PATIENT TYPE:  INP   LOCATION:  9131                          FACILITY:  [facility]   PHYSICIAN:  Lesly Dukes, M.D. DATE OF BIRTH:  1986/03/19   DATE OF PROCEDURE:  DATE OF DISCHARGE:                                 OPERATIVE REPORT   PREOPERATIVE DIAGNOSIS:  The patient is an 25 year old, para 0 female at 36  weeks and two days estimated gestational age with arrest of dilation and  chorioamnionitis.   POSTOPERATIVE DIAGNOSIS:  The patient is an 25 year old, para 0 female at 58  weeks and two days estimated gestational age with arrest of dilation and  chorioamnionitis plus malpresentation with right occiput transverse and  __________ .   OPERATION/PROCEDURE:  Primary low transverse cesarean section.   SURGEON:  Lesly Dukes, M.D.   ASSISTANT:  Altamese Cabal, M.D.   ANESTHESIA:  Epidural.   ESTIMATED BLOOD LOSS:  800 mg.   COMPLICATIONS:  None.   FINDINGS:  Viable female infant with a normal venous cord gas.  Arterial  blood gas was not able to be obtained.  Grossly normal uterus, ovaries and  fallopian tubes.   DESCRIPTION OF PROCEDURE:  After informed consent was obtained, the patient  was taken to the operating room where epidural anesthesia was found to be  adequate.  The patient was placed in the dorsal supine position.  The Foley  was already in the bladder. The patient was prepped and draped in the normal  sterile fashion.   A Pfannenstiel skin incision was made and carried down to the fascia.  The  fascia was incised in the midline and this incision was extended  bilaterally.  The superior and inferior aspects of the fascial incision were  grasped with Kocher clamps, tented up, dissected off sharply and bluntly  from underlying layers of rectus muscles.  The rectus muscles were separated  in the midline.  The peritoneum was entered bluntly and this incision  was  extended both superior and inferiorly with good visualization of the  bladder.  The bladder blade was inserted.  The vesicouterine peritoneum was  identified, tented up and entered sharply with the Metzenbaum scissors.  This incision was extended bilaterally and the bladder flap was created  digitally.  Uterine incision was made in a transverse fashion in the lower  uterine segment with the scalpel.  This incision was extended bilaterally  and cephalad bluntly.  The head was delivered atraumatically from the ROT  position.  The nose and mouth were suctioned.  The rest of the baby's body  delivered easily.  The cord was clamped and cut and the baby was handed off  to the waiting pediatrician.  Placenta delivered manually and uterus was  cleared of all clots and debris and exteriorized.  As noted before a venous  gas was sent but arterial gas could not be obtained.   The uterine incision was closed with 0 Vicryl in a running locked fashion  and the center portion was reinforced with a second suture of  0 Vicryl.  The  uterus was returned to the abdominal cavity and noted to be hemostatic off  tension. The peritoneal cavity was copiously irrigated and found to be  hemostatic.  The peritoneum and rectus muscle were found to be hemostatic.  The fascia was closed with 0 Vicryl in a running fashion. The subcutaneous  tissue was closed copiously irrigated and found to be hemostatic.  The skin  was closed with staples.  The patient tolerated the procedure well.  Sponge,  lap, instrument, and needle counts were x2.  The patient went to the  recovery room in stable condition.           ______________________________  Lesly Dukes, M.D.     KHL/MEDQ  D:  10/27/2006  T:  10/27/2006  Job:  [id number]

## 2011-04-18 NOTE — Discharge Summary (Signed)
[name] [name], [name] [name]               ACCOUNT NO.:  [id number]   MEDICAL RECORD NO.:  [id number]          PATIENT TYPE:  INP   LOCATION:  9147                          FACILITY:  [facility]   PHYSICIAN:  Lesly Dukes, M.D. DATE OF BIRTH:  [birth year]   DATE OF ADMISSION:  10/25/2006  DATE OF DISCHARGE:  10/30/2006                               DISCHARGE SUMMARY   DISCHARGE DIAGNOSIS:  Primary lower transverse cesarean section for  failure to progress.   DISCHARGE MEDICATIONS:  1. Percocet 5/325 mg one to two tablets p.o. q.6h. p.r.n.  2. Micronor one tablet p.o. daily.  3. Multivitamin one tablet p.o. daily.   BRIEF HOSPITAL COURSE:  This patient is an 25 year old G1, P1, who  presented at 41-1/2 weeks' gestation in latent labor.  The patient was  admitted and her labor was augmented by Pitocin.  The patient progressed  to nearly 10 cm dilation; however, labor stalled at that point and the  patient started to develop chorioamnionitis.  Therefore, a primary lower  transverse C-section was performed without complications.  A female infant  was delivered with Apgars of 9 and 9.  Estimated blood loss was 600 mL.  Mother and baby were both stable post procedure with no complications.  Both had a normal post delivery course and were discharged home on  hospital day #3.  The labor was thought to stall secondary to the baby  being in occiput transverse position.   DISCHARGE INSTRUCTIONS:  The patient to follow up at the family practice  center in one week for suture removal and in six weeks for a postpartum  visit.  Mom to take all of her medications as prescribed.      Altamese Cabal, M.D.    ______________________________  Lesly Dukes, M.D.    KS/MEDQ  D:  11/23/2006  T:  11/23/2006  Job:  [id number]

## 2011-04-29 ENCOUNTER — Telehealth: Payer: Self-pay | Admitting: Family Medicine

## 2011-04-29 NOTE — Telephone Encounter (Signed)
Started having abd pain on Sunday and then started diarrhea yesterday.  Wants to know what she can take.  Wants to talk to nurse before she makes an appt.

## 2011-04-29 NOTE — Telephone Encounter (Signed)
Symptoms began with stomach pains followed by diarrhea.  Had about 5 episodes Sunday and 2 to 3 since then.  Advised her that a GI bug has been going around and the best thing was to avoid foods that aggravated it and to drink lots of fluids, preferably Gatorade, Ginger Ale, water, etc.  Her daughter has an appt tomorrow so if she is not feeling any better we would work in in  when she comes in with her daughter.  Patient agreable.

## 2011-05-15 ENCOUNTER — Encounter: Payer: Self-pay | Admitting: Family Medicine

## 2011-05-15 ENCOUNTER — Ambulatory Visit (INDEPENDENT_AMBULATORY_CARE_PROVIDER_SITE_OTHER): Payer: Medicaid Other | Admitting: Family Medicine

## 2011-05-15 ENCOUNTER — Other Ambulatory Visit (HOSPITAL_COMMUNITY)
Admission: RE | Admit: 2011-05-15 | Discharge: 2011-05-15 | Disposition: A | Discharge status: Home / Self Care | Payer: Medicaid Other | Source: Ambulatory Visit | Attending: Family Medicine | Admitting: Family Medicine

## 2011-05-15 VITALS — BP 127/76 | HR 79 | Wt 199.2 lb

## 2011-05-15 DIAGNOSIS — K5900 Constipation, unspecified: Secondary | ICD-10-CM | POA: Insufficient documentation

## 2011-05-15 DIAGNOSIS — R519 Headache, unspecified: Secondary | ICD-10-CM | POA: Insufficient documentation

## 2011-05-15 DIAGNOSIS — Z20828 Contact with and (suspected) exposure to other viral communicable diseases: Secondary | ICD-10-CM

## 2011-05-15 DIAGNOSIS — Z202 Contact with and (suspected) exposure to infections with a predominantly sexual mode of transmission: Secondary | ICD-10-CM

## 2011-05-15 DIAGNOSIS — Z124 Encounter for screening for malignant neoplasm of cervix: Secondary | ICD-10-CM

## 2011-05-15 DIAGNOSIS — R51 Headache: Secondary | ICD-10-CM | POA: Insufficient documentation

## 2011-05-15 DIAGNOSIS — Z01419 Encounter for gynecological examination (general) (routine) without abnormal findings: Secondary | ICD-10-CM | POA: Insufficient documentation

## 2011-05-15 DIAGNOSIS — N760 Acute vaginitis: Secondary | ICD-10-CM

## 2011-05-15 LAB — RPR

## 2011-05-15 MED ORDER — DOCUSATE SODIUM 100 MG PO CAPS: 100.0000 mg | ORAL_CAPSULE | Freq: Two times a day (BID) | ORAL | Status: AC

## 2011-05-15 NOTE — Progress Notes (Signed)
Here for a well woman exam. No vaginal discharge or pain. Has implinon for birth control. Has occasional spotting but feels well otherwise.  -Mood: Well not taking SSRI. No SI/HI. Feels happy currently. Her boyfriend is helping out with the kids more. No complaints.  -Headache: Notes mild headache for a few weeks now. Is band like. No radiation. Can last all day. Is usually mild but is sometimes bad. She does not take any medication for headache. Went to optometrist for a vision exam and was told that she has an astigmatism. She did not get any glasses with her Rx. No vision changes or neuro changes.  -Mild Abdominal pain. Notes constipation. Only a BM every few days. Is hard. Is not taking the stool softeners currently. No nausea or vomiting.   PMH reviewed.  ROS as above otherwise neg  Exam:  Vs noted.  Gen: Well NAD Eye: EOMI, PERRLA, Fundi normal with sharp disc margins.  Lungs: CTABL Nl WOB Heart: RRR no MRG Abd: NABS, NT, ND Exts: Non edematous BL  LE Pelvic: Normal external and internal genitalia. Non tender. Minimal white discharge.  Neuro: AOx3 with normal CN exam. Normal gait.

## 2011-05-15 NOTE — Assessment & Plan Note (Signed)
Doing well. On contraception. Getting pap smear today.  No pelvic or reproductive issues.  Routine care.

## 2011-05-15 NOTE — Patient Instructions (Signed)
Thank you for coming in today. Take stool softness twice a day.  Try pain medicine for headache.  Try warm cloth to your neck.  Get those glasses. Let me know if you cannot afford them. Also let your social worker know.  If you still have headache in 1 month come on back.

## 2011-05-15 NOTE — Assessment & Plan Note (Signed)
I think constipation is the cause of her mild abdominal pain.  Plan docusate and f/u if not better.Red flags reviewed.

## 2011-05-15 NOTE — Assessment & Plan Note (Signed)
Think tension type. Fundi and neuro exams normal.  Advised pain medications (OTC) prn and warm compress. Will f/u if not better in 1 month. Red flags reviewed.

## 2011-05-21 ENCOUNTER — Telehealth: Payer: Self-pay | Admitting: Family Medicine

## 2011-05-21 ENCOUNTER — Encounter: Payer: Self-pay | Admitting: Family Medicine

## 2011-05-21 NOTE — Telephone Encounter (Signed)
Left a message to call back to schedule a COLPO. Left a message on both the mobile and home numbers.

## 2011-06-05 ENCOUNTER — Encounter: Payer: Self-pay | Admitting: Family Medicine

## 2011-06-05 ENCOUNTER — Ambulatory Visit (INDEPENDENT_AMBULATORY_CARE_PROVIDER_SITE_OTHER): Payer: Medicaid Other | Admitting: Family Medicine

## 2011-06-05 VITALS — BP 122/80 | HR 105 | Temp 98.5°F | Wt 194.0 lb

## 2011-06-05 DIAGNOSIS — IMO0002 Reserved for concepts with insufficient information to code with codable children: Secondary | ICD-10-CM

## 2011-06-05 DIAGNOSIS — R87612 Low grade squamous intraepithelial lesion on cytologic smear of cervix (LGSIL): Secondary | ICD-10-CM

## 2011-06-05 NOTE — Progress Notes (Signed)
  Subjective:    Patient ID: [name] [name], female    DOB: [birth year], 25 y.o.   MRN: [id number]  HPI LGSIL on pap asymptomatic  Review of Systems No pelvic pain or vaginal discharge.    Objective:   Physical Exam    GU: Externally normal area of normal bimanual without adnexal mass. Patient given informed consent, signed copy in the chart.  Placed in lithotomy position. Cervix viewed with speculum and colposcope after application of acetic acid.   Colposcopy adequate (entire squamocolumnar junctions seen  in entirety) ?  yes Acetowhite lesions?no Punctation?no Mosaicism?  no Abnormal vasculature?  no Biopsies?no ECC?no Complications? no  COMMENTS: Patient was given post procedure instructions.       Assessment & Plan:  LGSIL on Pap with clinically normal colposcopy. Will repeat Pap smear in one year.

## 2011-07-07 ENCOUNTER — Ambulatory Visit (HOSPITAL_COMMUNITY)
Admission: RE | Admit: 2011-07-07 | Discharge: 2011-07-07 | Disposition: A | Discharge status: Home / Self Care | Payer: Medicaid Other | Source: Ambulatory Visit | Attending: Family Medicine | Admitting: Family Medicine

## 2011-07-07 ENCOUNTER — Ambulatory Visit (INDEPENDENT_AMBULATORY_CARE_PROVIDER_SITE_OTHER): Payer: Medicaid Other | Admitting: Family Medicine

## 2011-07-07 VITALS — BP 116/81 | HR 76 | Wt 198.0 lb

## 2011-07-07 DIAGNOSIS — M25579 Pain in unspecified ankle and joints of unspecified foot: Secondary | ICD-10-CM

## 2011-07-07 LAB — POCT URINE PREGNANCY: Preg Test, Ur: NEGATIVE

## 2011-07-07 NOTE — Progress Notes (Signed)
  Subjective:    Patient ID: [name] [name], female    DOB: 07/27/1986, 25 y.o.   MRN: [id number]  HPI L ankle/foot pain: Pt states that she rolled ankle over the weekend while walking. Pt states that she then re-injured area again 8-12 hours later. Similar mechanism of rolling ankle. Pt denies any swelling, erythema since incident. Has been able to bear weight with mild pain. Pain worse with ankle eversion. Lateral ankle also TTP per pt.   Pt also states that during the weekend, object from dresser fell on L great toe. Has been able to bear weight on toe with mild pain. No swelling or erythema.    Review of Systems See HPI    Objective:   Physical Exam Gen: Obese, NAD L foot/ankle: No visible erythema, redness on ankle generally. + TTP over lateral malleolus. Full ROM. + Pain w/ ankle eversion. + 1 x 0.3 cm hematoma over L ant great toe. No peripheral redness, swelling. Minimal TTP across anterior great toe.       Assessment & Plan:  L ankle/foot pain: Likely mild ankle sprain. Given mechanism of injury and subjectively positive [place] ankle rules, will send for x ray of ankle and foot. Ankle sprain handout given to pt for review. Mild bruising over anterior great toe. No clinical signs of fracture. Discussed conservative care. Will follow up pending x-rays.

## 2011-07-07 NOTE — Assessment & Plan Note (Addendum)
Likely mild ankle sprain and mild anterior toe contusion. Given mechanism of injury and subjectively positive [place] ankle rules, will send for x ray of ankle and foot. Ankle sprain handout given to pt for review. Mild bruising over anterior great toe. No clinical signs of fracture. Discussed conservative care. Will follow up pending x-rays.

## 2011-07-07 NOTE — Patient Instructions (Signed)
Ankle Sprain An ankle sprain is an injury to the ligaments that hold the ankle joint together.   CAUSE The injury is usually caused by a fall or by twisting the ankle. It is important to tell your caregiver how the injury occurred and whether or not you were able to walk immediately after the injury.  SYMPTOMS Pain is the primary symptom. It may be present at rest or only when you are trying to stand or walk. The ankle will likely be swollen. Bruising may develop immediately or after 1 or 2 days. It may be difficult or impossible to stand or walk. This depends on the severity of the sprain. DIAGNOSIS Your caregiver can determine if a sprain has occurred based on the accident details and on examination of your ankle. Examination will include pressing and squeezing areas of the foot and ankle. Your caregiver will try to move the ankle in certain ways. X-rays may be used to be sure a bone was not broken, or that the ligament did not pull off of a bone (avulsion). There are standard guidelines that can reliably determine if an x-ray is needed. RISKS AND COMPLICATIONS A person who has sprained their ankle will be more prone to a repeat sprain. Long term pain with standing or walking or difficulty walking (chronic instability of the ankle) may result from an ankle sprain. TREATMENT Rest, ice, elevation, and compression are the basic modes of treatment (see home care instructions, below). Certain types of braces can help stabilize the ankle and allow early return to walking. Your caregiver can make a recommendation for this. Medication may be recommended for pain. You may be referred to an orthopedist or a physical therapist for certain types of severe sprains. HOME CARE INSTRUCTIONS Use tylenol as needed for pain.   Apply ice to the sore area for 15 minutes 3 times per day. Do this while you are awake for the first 2 days, or as directed. This can be stopped when the swelling goes away. Put the ice in a  plastic bag and place a towel between the bag of ice and your skin.   Keep your leg elevated when possible to lessen swelling.   If your caregiver recommends crutches, use them as instructed with a non-weight bearing cast for 1 week. Then, you may walk on your ankle as the pain allows, or as instructed. Gradually, put weight on the affected ankle. Continue to use crutches or cane until you can walk without causing pain.   If a plaster splint was applied, wear the splint until you are seen for a follow-up examination. Rest it on nothing harder than a pillow the first 24 hours. DO NOT put weight on it. DO NOT get it wet. You may take it off to take a shower or bath.   You may have been given an elastic bandage to use with the plaster splint, or you may have been given a elastic bandage to use alone. The elastic bandage is too tight if you have numbness, tingling, or if your foot becomes cold and blue. Adjust the bandage to make it comfortable.   If an air splint was applied, you may blow more air into it or take some out to make it more comfortable. You may take it off at night and to take a shower or bath. Wiggle your toes in the splint several times per day if you are able.   Only take over-the-counter or prescription medicines for pain, discomfort, or fever  as directed by your caregiver.   Do not drive a vehicle until your caregiver specifically tells you it is safe to do so.  SEEK MEDICAL CARE IF:  You have an increase in bruising, swelling, or pain.   You notice coldness of your toes.   Pain relief is not achieved with medications.  SEEK IMMEDIATE MEDICAL CARE IF: your toes are numb or blue or you have severe pain. MAKE SURE YOU:   Understand these instructions.   Will watch your condition.   Will get help right away if you are not doing well or get worse.  Document Released: 11/17/2005 Document Re-Released: 02/13/2009 [facility] Patient Information 2011 [place], [place].

## 2011-07-08 ENCOUNTER — Encounter: Payer: Self-pay | Admitting: Family Medicine

## 2011-07-09 ENCOUNTER — Telehealth: Payer: Self-pay | Admitting: Family Medicine

## 2011-07-09 NOTE — Telephone Encounter (Signed)
Pt requesting xray results

## 2011-07-09 NOTE — Telephone Encounter (Signed)
Left a message. No fracture.

## 2011-07-14 ENCOUNTER — Emergency Department (HOSPITAL_COMMUNITY)
Admission: EM | Admit: 2011-07-14 | Discharge: 2011-07-14 | Disposition: A | Discharge status: Home / Self Care | Payer: Medicaid Other | Attending: Emergency Medicine | Admitting: Emergency Medicine

## 2011-07-14 ENCOUNTER — Emergency Department (HOSPITAL_COMMUNITY): Payer: Medicaid Other

## 2011-07-14 DIAGNOSIS — S93409A Sprain of unspecified ligament of unspecified ankle, initial encounter: Secondary | ICD-10-CM | POA: Insufficient documentation

## 2011-07-14 DIAGNOSIS — M25579 Pain in unspecified ankle and joints of unspecified foot: Secondary | ICD-10-CM | POA: Insufficient documentation

## 2011-07-14 DIAGNOSIS — W172XXA Fall into hole, initial encounter: Secondary | ICD-10-CM | POA: Insufficient documentation

## 2011-07-22 ENCOUNTER — Emergency Department (HOSPITAL_COMMUNITY)
Admission: EM | Admit: 2011-07-22 | Discharge: 2011-07-22 | Disposition: A | Discharge status: Home / Self Care | Payer: Medicaid Other | Attending: Emergency Medicine | Admitting: Emergency Medicine

## 2011-07-22 DIAGNOSIS — R42 Dizziness and giddiness: Secondary | ICD-10-CM | POA: Insufficient documentation

## 2011-07-22 DIAGNOSIS — R51 Headache: Secondary | ICD-10-CM | POA: Insufficient documentation

## 2011-07-22 DIAGNOSIS — E860 Dehydration: Secondary | ICD-10-CM | POA: Insufficient documentation

## 2011-07-22 LAB — CBC
HCT: 44.5 % (ref 36.0–46.0)
Hemoglobin: 14.9 g/dL (ref 12.0–15.0)
MCH: 32.1 pg (ref 26.0–34.0)
MCV: 95.9 fL (ref 78.0–100.0)
Platelets: 386 10*3/uL (ref 150–400)
RBC: 4.64 MIL/uL (ref 3.87–5.11)
WBC: 11.2 10*3/uL — ABNORMAL HIGH (ref 4.0–10.5)

## 2011-07-22 LAB — POCT I-STAT, CHEM 8
BUN: 18 mg/dL (ref 6–23)
Calcium, Ion: 1.04 mmol/L — ABNORMAL LOW (ref 1.12–1.32)
Chloride: 107 mEq/L (ref 96–112)
HCT: 47 % — ABNORMAL HIGH (ref 36.0–46.0)
Potassium: 4.3 mEq/L (ref 3.5–5.1)
Sodium: 141 mEq/L (ref 135–145)

## 2011-07-22 LAB — URINALYSIS, ROUTINE W REFLEX MICROSCOPIC
Nitrite: NEGATIVE
Specific Gravity, Urine: 1.025 (ref 1.005–1.030)
Urobilinogen, UA: 0.2 mg/dL (ref 0.0–1.0)

## 2011-07-22 LAB — DIFFERENTIAL
Lymphocytes Relative: 20 % (ref 12–46)
Lymphs Abs: 2.3 10*3/uL (ref 0.7–4.0)
Monocytes Relative: 3 % (ref 3–12)
Neutro Abs: 8.4 10*3/uL — ABNORMAL HIGH (ref 1.7–7.7)
Neutrophils Relative %: 75 % (ref 43–77)

## 2011-07-22 LAB — URINE MICROSCOPIC-ADD ON

## 2011-07-22 LAB — POCT PREGNANCY, URINE: Preg Test, Ur: NEGATIVE

## 2011-08-28 LAB — WET PREP, GENITAL
Clue Cells Wet Prep HPF POC: NONE SEEN
Trich, Wet Prep: NONE SEEN
Yeast Wet Prep HPF POC: NONE SEEN

## 2011-08-28 LAB — GC/CHLAMYDIA PROBE AMP, GENITAL
Chlamydia, DNA Probe: NEGATIVE
GC Probe Amp, Genital: NEGATIVE

## 2011-09-05 LAB — URINALYSIS, ROUTINE W REFLEX MICROSCOPIC
Glucose, UA: NEGATIVE
Ketones, ur: NEGATIVE
pH: 5.5

## 2011-09-05 LAB — WET PREP, GENITAL: Clue Cells Wet Prep HPF POC: NONE SEEN

## 2011-09-11 ENCOUNTER — Telehealth: Payer: Self-pay | Admitting: Family Medicine

## 2011-09-11 NOTE — Telephone Encounter (Signed)
Ms. Venezia is calling because she needs a copy of her Medical Record from her pregnancy.  Her daughter was born December 2011.  She needs it by October 19.  She would like to know what the charge is going to be.

## 2011-09-17 ENCOUNTER — Ambulatory Visit: Payer: Medicaid Other | Admitting: Family Medicine

## 2011-10-10 ENCOUNTER — Ambulatory Visit (INDEPENDENT_AMBULATORY_CARE_PROVIDER_SITE_OTHER): Payer: Medicaid Other | Admitting: Family Medicine

## 2011-10-10 ENCOUNTER — Encounter: Payer: Self-pay | Admitting: Family Medicine

## 2011-10-10 VITALS — BP 140/90 | HR 73 | Ht 62.0 in | Wt 192.0 lb

## 2011-10-10 DIAGNOSIS — R300 Dysuria: Secondary | ICD-10-CM

## 2011-10-10 DIAGNOSIS — L748 Other eccrine sweat disorders: Secondary | ICD-10-CM | POA: Insufficient documentation

## 2011-10-10 DIAGNOSIS — Z87898 Personal history of other specified conditions: Secondary | ICD-10-CM

## 2011-10-10 DIAGNOSIS — T7411XA Adult physical abuse, confirmed, initial encounter: Secondary | ICD-10-CM

## 2011-10-10 DIAGNOSIS — IMO0002 Reserved for concepts with insufficient information to code with codable children: Secondary | ICD-10-CM | POA: Insufficient documentation

## 2011-10-10 DIAGNOSIS — N760 Acute vaginitis: Secondary | ICD-10-CM | POA: Insufficient documentation

## 2011-10-10 HISTORY — DX: Personal history of other specified conditions: Z87.898

## 2011-10-10 LAB — POCT URINALYSIS DIPSTICK
Ketones, UA: NEGATIVE
Leukocytes, UA: NEGATIVE
Protein, UA: NEGATIVE
Spec Grav, UA: 1.025
pH, UA: 7

## 2011-10-10 MED ORDER — METRONIDAZOLE 500 MG PO TABS: 500.0000 mg | ORAL_TABLET | Freq: Two times a day (BID) | ORAL | Status: AC

## 2011-10-10 MED ORDER — FLUCONAZOLE 150 MG PO TABS: 150.0000 mg | ORAL_TABLET | Freq: Once | ORAL | Status: AC
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
## 2011-10-10 NOTE — Patient Instructions (Signed)
Thank you for coming in today. I provided medicine for both BV and yeast infections.  Take the metronidazole twice a day for 1 week.  Take the fluconazole 1 pill once.  See me in 1 month if not better.

## 2011-10-10 NOTE — Assessment & Plan Note (Signed)
[name] [name] has 2 children with her today and is unable to do a vaginal exam. Plan to treat empirically for both picture vaginosis and yeast infection with metronidazole and fluconazole. Followup as needed

## 2011-10-10 NOTE — Assessment & Plan Note (Signed)
No significant exam findings today. Advised trying different soaps and deodorants. Also advise in antibiotic soap. Followup as needed

## 2011-10-16 ENCOUNTER — Telehealth: Payer: Self-pay | Admitting: Clinical
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
## 2011-10-31 ENCOUNTER — Other Ambulatory Visit: Payer: Self-pay | Admitting: Family Medicine

## 2011-10-31 MED ORDER — FLUCONAZOLE 150 MG PO TABS: 150.0000 mg | ORAL_TABLET | Freq: Once | ORAL | Status: AC

## 2011-10-31 MED ORDER — METRONIDAZOLE 500 MG PO TABS: 500.0000 mg | ORAL_TABLET | Freq: Two times a day (BID) | ORAL | Status: AC

## 2011-10-31 NOTE — Telephone Encounter (Signed)
Spoke with Lajoyce Corners.  Pt reports doing well.  Kids are getting on her nerves and manages them when they are difficult by sending them to their rooms.  Pt reports taking care of her self and didn't have any concerns.  Pt was able to tell me when their next appointment was.  Pt's overall goal is parenting skills, family relationships, and showing up for appointments.

## 2011-11-19 ENCOUNTER — Ambulatory Visit: Payer: Medicaid Other | Admitting: Family Medicine

## 2012-01-19 ENCOUNTER — Ambulatory Visit: Payer: Medicaid Other | Admitting: Family Medicine

## 2012-01-30 ENCOUNTER — Other Ambulatory Visit (HOSPITAL_COMMUNITY)
Admission: RE | Admit: 2012-01-30 | Discharge: 2012-01-30 | Disposition: A | Discharge status: Home / Self Care | Payer: Medicaid Other | Source: Ambulatory Visit | Attending: Family Medicine | Admitting: Family Medicine

## 2012-01-30 ENCOUNTER — Encounter: Payer: Self-pay | Admitting: Family Medicine

## 2012-01-30 ENCOUNTER — Ambulatory Visit (INDEPENDENT_AMBULATORY_CARE_PROVIDER_SITE_OTHER): Payer: Medicaid Other | Admitting: Family Medicine

## 2012-01-30 ENCOUNTER — Ambulatory Visit: Payer: Medicaid Other | Admitting: Family Medicine

## 2012-01-30 DIAGNOSIS — Z113 Encounter for screening for infections with a predominantly sexual mode of transmission: Secondary | ICD-10-CM | POA: Insufficient documentation

## 2012-01-30 DIAGNOSIS — K5900 Constipation, unspecified: Secondary | ICD-10-CM

## 2012-01-30 DIAGNOSIS — Z20828 Contact with and (suspected) exposure to other viral communicable diseases: Secondary | ICD-10-CM | POA: Insufficient documentation

## 2012-01-30 DIAGNOSIS — N760 Acute vaginitis: Secondary | ICD-10-CM

## 2012-01-30 LAB — POCT WET PREP (WET MOUNT): Clue Cells Wet Prep Whiff POC: NEGATIVE

## 2012-01-30 MED ORDER — POLYETHYLENE GLYCOL 3350 17 GM/SCOOP PO POWD: 17.0000 g | Freq: Two times a day (BID) | ORAL | Status: AC | PRN

## 2012-01-30 NOTE — Patient Instructions (Signed)
I will call you at [phone] with the results on Monday Please take MiraLAX for her constipation

## 2012-02-04 ENCOUNTER — Encounter: Payer: Self-pay | Admitting: Family Medicine

## 2012-02-04 NOTE — Progress Notes (Signed)
  Subjective:    Patient ID: [name] [name], female    DOB: Oct 14, 1986, 26 y.o.   MRN: [id number]  HPI  Pt here for complaint of vaginal itch, mild, without d/c.  Concern because partner is sleeping with someone else and she is not using condoms.  She would like STD testing.  No abd pain, no change in periods.    Review of Systems See above    Objective:   Physical Exam Vital signs reviewed General appearance - alert, well appearing, and in no distress and oriented to person, place, and time Abdomen - soft, nontender, nondistended, no masses or organomegaly GYN- external genetalia normal, without lesions.  Vagina normal color, rugations, without discharge.  Cervix normal color without lesions or discharge.        Assessment & Plan:

## 2012-02-04 NOTE — Assessment & Plan Note (Signed)
Complaint of mild itch without discharge.  Wants STD test today.  Will check HIV RPR, wet prep GC/C today.

## 2012-03-01 ENCOUNTER — Ambulatory Visit: Payer: Medicaid Other | Admitting: Family Medicine

## 2012-03-04 ENCOUNTER — Ambulatory Visit (INDEPENDENT_AMBULATORY_CARE_PROVIDER_SITE_OTHER): Payer: Medicaid Other | Admitting: Family Medicine

## 2012-03-04 ENCOUNTER — Encounter: Payer: Self-pay | Admitting: Family Medicine

## 2012-03-04 VITALS — BP 139/88 | HR 87 | Temp 98.2°F | Ht 62.0 in | Wt 192.0 lb

## 2012-03-04 DIAGNOSIS — R300 Dysuria: Secondary | ICD-10-CM | POA: Insufficient documentation

## 2012-03-04 LAB — POCT UA - MICROSCOPIC ONLY

## 2012-03-04 LAB — POCT URINALYSIS DIPSTICK
Blood, UA: NEGATIVE
Protein, UA: NEGATIVE
Spec Grav, UA: 1.025
Urobilinogen, UA: 0.2
pH, UA: 5.5

## 2012-03-04 MED ORDER — CEPHALEXIN 500 MG PO CAPS: 500.0000 mg | ORAL_CAPSULE | Freq: Three times a day (TID) | ORAL | Status: AC

## 2012-03-04 NOTE — Progress Notes (Signed)
Addended by: [place], Issaih Kaus on: 03/04/2012 02:45 PM   Modules accepted: Orders

## 2012-03-04 NOTE — Assessment & Plan Note (Signed)
Exam and history clinically consistent with UTI. Will culture urine and treat with keflex x 7 days. Handout given. Infectious red flags discussed.

## 2012-03-04 NOTE — Patient Instructions (Signed)

## 2012-03-04 NOTE — Progress Notes (Signed)
  Subjective:    Patient ID: [name] [name], female    DOB: [birth year], 26 y.o.   MRN: [id number]  HPI DYSURIA Onset:  2 weeks  Description: burning before and during urination  Modifying factors: no  Symptoms Urgency:  mild Frequency:  mild Hesitancy:  no Hematuria:  no Flank Pain:  Mild/subjective  Fever: no Nausea/Vomiting:  no Missed LMP: no STD exposure: no Discharge: no Irritants: no Rash: no  Red Flags   More than 3 UTI's last 12 months:  no PMH of  Diabetes or Immunosuppression:  no Renal Disease/Calculi: no Urinary Tract Abnormality:  no Instrumentation or Trauma: no  Review of Systems See HPI, otherwise ROS negative     Objective:   Physical Exam Gen: up in chair, NAD HEENT: NCAT, EOMI CV: RRR, no murmurs auscultated PULM: CTAB, no wheezes, rales, rhoncii ABD: obese abdomen, + bowel sounds, + suprapubic tenderness EXT: 2+ peripheral pulses    Assessment & Plan:

## 2012-03-29 ENCOUNTER — Telehealth: Payer: Self-pay | Admitting: *Deleted

## 2012-03-29 ENCOUNTER — Ambulatory Visit (INDEPENDENT_AMBULATORY_CARE_PROVIDER_SITE_OTHER): Payer: Medicaid Other | Admitting: Family Medicine

## 2012-03-29 ENCOUNTER — Encounter: Payer: Self-pay | Admitting: Family Medicine

## 2012-03-29 VITALS — BP 125/89 | HR 90 | Ht 62.0 in | Wt 192.0 lb

## 2012-03-29 DIAGNOSIS — R51 Headache: Secondary | ICD-10-CM

## 2012-03-29 DIAGNOSIS — H538 Other visual disturbances: Secondary | ICD-10-CM

## 2012-03-29 DIAGNOSIS — H547 Unspecified visual loss: Secondary | ICD-10-CM

## 2012-03-29 LAB — POCT GLYCOSYLATED HEMOGLOBIN (HGB A1C): Hemoglobin A1C: 4.7

## 2012-03-29 NOTE — Patient Instructions (Signed)
It was nice to meet you today.  I think that the blurred vision you are having could be due to what we call: "miopia". This needs to be evaluated by an eye doctor to make sure that you don't need glasses. I am also going to check a sugar to make sure that there isn't a component of diabetes.   For the headache, you can take ibuprofen 1-2 tablets with food every 6-8 hours. It could be due to tension headache from strain in your neck. I am giving you a journal for you to fill out to help [place] see when you are having the ehadaches. Come back for a follow up in 1 month to make sure that things are better.

## 2012-03-29 NOTE — Telephone Encounter (Signed)
Called patient and informed her of appointment with Dr Mitzi Davenport on Apr 22, 2012 at 10:45 am.[name] [name], Rodena Medin

## 2012-03-30 DIAGNOSIS — R519 Headache, unspecified: Secondary | ICD-10-CM | POA: Insufficient documentation

## 2012-03-30 DIAGNOSIS — R51 Headache: Secondary | ICD-10-CM | POA: Insufficient documentation

## 2012-03-30 NOTE — Assessment & Plan Note (Signed)
No red flags on exam or by history. This is most likely secondary to tension headache. Tension and stress could also be secondary to depression. Patient not taking prozac that she used to take.  Recommended tylenol and ibuprofen when pain occurs. Also gave headache diary to fill out for her follow up visit in 3-4 weeks.

## 2012-03-30 NOTE — Progress Notes (Signed)
Patient ID: [name] [name], female   DOB: May 20, 1986, 26 y.o.   MRN: [id number] Patient ID: [name] [name]    DOB: 03-Mar-1986, 26 y.o.   MRN: [id number] --- Subjective:  [name] [name] is a 26 y.o.female who presents with blurred vision and headache.  - Blurry vision: worst when she looks at things that are far away. The headache is independent from the blurry vision. It has been going on for 1 year. She feels like it has gotten worst in the last 2-3 months. She has never worn glasses. Last time she saw an eye doctor was when she was a child.  Patient concerned that change in vision could be related to diabetes. Denies any polyuria, dysuria or polydipsia.  - Headache: across the head, pounding, occurs roughly twice a week. Lasts for a few hours. Denies any photophobia, phonophobia. No nausea or vomiting. She has taken hydrocodeine that was given to her for a tooth. She says this helped. No fever. No nuchal rigidity. Not worst at any time of day. Pain doesn't wake her up from sleep.    ROS: see HPI Soc: passive smoker  PMHx: reviewed medications and allergies. Objective: Filed Vitals:   03/29/12 1111  BP: 125/89  Pulse: 90   Vision: 20/50 Right eye 20/25 left eye; 20/25 both eyes  Physical Examination:   General appearance - alert, well appearing, and in no distress Head - reproducible tenderness along back of head. Eyes - intact peripheral vision, PERRLA, fundoscopic exam attempted but unable to visualize, EOMI Nose - normal and patent, mild inflammation of nasal turbinates Mouth - mucous membranes moist, pharynx normal without lesions Neck - supple, no significant adenopathy Chest - clear to auscultation, no wheezes, rales or rhonchi, symmetric air entry Heart - normal rate, regular rhythm, normal S1, S2, no murmurs, rubs, clicks or gallops Neuro - CN 2-12 grossly intact, 5/5 strength in upper and lower extremities bilaterally.

## 2012-03-30 NOTE — Assessment & Plan Note (Addendum)
Possibility that difficulty seeing at distances is due to myopia. Patient to see ophthalmology to be evaluated.  Although fundoscopic exam was not able to visualize the fundus, vital signs, including blood pressure, were normal, making increased intracranial pressure less likely.  Patient was worried about possibility of diabetes explaining her change in vision and asked for A1C which was normal.

## 2012-04-28 ENCOUNTER — Ambulatory Visit (INDEPENDENT_AMBULATORY_CARE_PROVIDER_SITE_OTHER): Payer: Medicaid Other | Admitting: Family Medicine

## 2012-04-28 ENCOUNTER — Encounter: Payer: Self-pay | Admitting: Family Medicine

## 2012-04-28 VITALS — BP 127/88 | HR 105 | Ht 62.0 in | Wt 194.0 lb

## 2012-04-28 DIAGNOSIS — L0292 Furuncle, unspecified: Secondary | ICD-10-CM

## 2012-04-28 MED ORDER — DOXYCYCLINE HYCLATE 100 MG PO TABS: 100.0000 mg | ORAL_TABLET | Freq: Two times a day (BID) | ORAL | Status: DC

## 2012-04-28 MED ORDER — CEPHALEXIN 250 MG PO CAPS: 250.0000 mg | ORAL_CAPSULE | Freq: Four times a day (QID) | ORAL | Status: AC

## 2012-04-28 NOTE — Patient Instructions (Signed)
The area on your thigh will likely open and heal on its own.  Use the sitz bath as outlined below.  If you notice the area becoming larger, start having redness or warmth around the area, or have fever please return.  Take all of your prescribed antibiotic.  Sitz Bath A sitz bath is a warm water bath taken in the sitting position that covers only the hips and buttocks. It may be used for either healing or hygiene purposes. Sitz baths are also used to relieve pain, itching, or muscle spasms. The water may contain medicine. Moist heat will help you heal and relax.  HOME CARE INSTRUCTIONS   Fill the bathtub half full with warm water.   Sit in the water and open the drain a little.   Turn on the warm water to keep the tub half full. Keep the water running constantly.   Soak in the water for 15 to 20 minutes.   After the sitz bath, pat the affected area dry first.   Take 3 to 4 sitz baths a day.  SEEK MEDICAL CARE IF:  You get worse instead of better. Stop the sitz baths if you get worse. MAKE SURE YOU:  Understand these instructions.   Will watch your condition.   Will get help right away if you are not doing well or get worse.  Document Released: 08/09/2004 Document Revised: 11/06/2011 Document Reviewed: 02/14/2011 [facility] Patient Information 2012 [place], [place].

## 2012-05-02 DIAGNOSIS — L0292 Furuncle, unspecified: Secondary | ICD-10-CM | POA: Insufficient documentation

## 2012-05-02 NOTE — Progress Notes (Signed)
  Subjective:    Patient ID: [name] [name], female    DOB: Feb 25, 1986, 26 y.o.   MRN: [id number]  HPI 1. Boil:  Boil on L upper thigh x3-4 days.  Gradually enlarging and painful.  No drainage of area.  Denies fever, chills, redness around area.  Has had before and typically go away on their own.     Review of Systems     Objective:   Physical Exam  Constitutional: She appears well-nourished. No distress.  Skin:       Small area of induration ~1cm in diameter on upper, inner thigh of L leg.  .  Mildly tender, no areas of fluctuance.  No surrounding erythema.  Small area of drainage.          Assessment & Plan:

## 2012-05-02 NOTE — Assessment & Plan Note (Signed)
No fluctuance or areas that would be amenable to I&D.  Also there is some drainage already.  I think that with Sitz bath and antibiotics this will resolve without further intervention.  Given instructions for sitz batch, rx for cephalexin as pt. Is still breastfeeding.  Red flags given that should prompt return.

## 2012-06-01 ENCOUNTER — Encounter: Payer: Medicaid Other | Admitting: Family Medicine

## 2012-06-11 ENCOUNTER — Ambulatory Visit (INDEPENDENT_AMBULATORY_CARE_PROVIDER_SITE_OTHER): Payer: Medicaid Other | Admitting: Family Medicine

## 2012-06-11 VITALS — BP 104/74 | HR 72 | Temp 98.6°F | Ht 62.0 in | Wt 200.0 lb

## 2012-06-11 DIAGNOSIS — M549 Dorsalgia, unspecified: Secondary | ICD-10-CM

## 2012-06-11 LAB — POCT URINALYSIS DIPSTICK
Bilirubin, UA: NEGATIVE
Glucose, UA: NEGATIVE
Leukocytes, UA: NEGATIVE
Nitrite, UA: NEGATIVE

## 2012-06-11 MED ORDER — PRENATAL VITAMINS 0.8 MG PO TABS: 1.0000 | ORAL_TABLET | Freq: Every day | ORAL | Status: DC

## 2012-06-11 MED ORDER — TRAMADOL HCL 50 MG PO TABS: 50.0000 mg | ORAL_TABLET | Freq: Three times a day (TID) | ORAL | Status: AC | PRN

## 2012-06-11 MED ORDER — CYCLOBENZAPRINE HCL 10 MG PO TABS: 10.0000 mg | ORAL_TABLET | Freq: Every evening | ORAL | Status: AC | PRN

## 2012-06-11 NOTE — Assessment & Plan Note (Signed)
A: This is musculoskeletal back pain. No radicular symptoms to butt or down legs. No fever. No weight loss.  P: For your back pain please do the following: 1. Weight loss: watch your diet and increase exercise.  2. Medication: Take tramadol up to 3x per day. Take flexeril at night as needed.  3. Apply heating pad.  4. Do the back exercises below.  5. Come back in 2 weeks if your pain is not better the next step is to go to PT.

## 2012-06-11 NOTE — Patient Instructions (Addendum)
[name] [name],  Thank you for coming in today,   This is musculoskeletal back pain.  For your back pain please do the following: 1. Weight loss: watch your diet and increase exercise.  2. Medication: Take tramadol up to 3x per day. Take flexeril at night as needed.  3. Apply heating pad.  4. Do the back exercises below.  5. Come back in 2 weeks if your pain is not better the next step is to go to PT.   Dr. Armen Pickup    Back Exercises Back exercises help treat and prevent back injuries. The goal is to increase your strength in your belly (abdominal) and back muscles. These exercises can also help with flexibility. Start these exercises when told by your doctor. HOME CARE Back exercises include: Pelvic Tilt.  Lie on your back with your knees bent. Tilt your pelvis until the lower part of your back is against the floor. Hold this position 5 to 10 sec. Repeat this exercise 5 to 10 times.  Knee to Chest.  Pull 1 knee up against your chest and hold for 20 to 30 seconds. Repeat this with the other knee. This may be done with the other leg straight or bent, whichever feels better. Then, pull both knees up against your chest.  Sit-Ups or Curl-Ups.  Bend your knees 90 degrees. Start with tilting your pelvis, and do a partial, slow sit-up. Only lift your upper half 30 to 45 degrees off the floor. Take at least 2 to 3 seonds for each sit-up. Do not do sit-ups with your knees out straight. If partial sit-ups are difficult, simply do the above but with only tightening your belly (abdominal) muscles and holding it as told.  Hip-Lift.  Lie on your back with your knees flexed 90 degrees. Push down with your feet and shoulders as you raise your hips 2 inches off the floor. Hold for 10 seconds, repeat 5 to 10 times.  Back Arches.  Lie on your stomach. Prop yourself up on bent elbows. Slowly press on your hands, causing an arch in your low back. Repeat 3 to 5 times.  Shoulder-Lifts.  Lie face down with arms  beside your body. Keep hips and belly pressed to floor as you slowly lift your head and shoulders off the floor.  Do not overdo your exercises. Be careful in the beginning. Exercises may cause you some mild back discomfort. If the pain lasts for more than 15 minutes, stop the exercises until you see your doctor. Improvement with exercise for back problems is slow.  Document Released: 12/20/2010 Document Revised: 11/06/2011 Document Reviewed: 12/20/2010 [facility] Patient Information 2012 [place], [place].

## 2012-06-11 NOTE — Progress Notes (Signed)
Subjective:     Patient ID: [name] [name], female   DOB: [birth year], 26 y.o.   MRN: [id number]  HPI 26 yo F presents for same day visit to discuss back pain x 1.5 months. From bra line down to hips. Achy, 7/10 daytime, 8/10 nighttime. No inciting injury or fall. Achy pain. No fever. Weight gain. Tried tylenol, motrin and aleve at home without relief. Worse wihen sitting for prolonged times. Better with lying down.   Review of Systems As per HPI     Objective:   Physical Exam BP 104/74  Pulse 72  Temp 98.6 F (37 C) (Oral)  Ht 5\' 2"  (1.575 m)  Wt 200 lb (90.719 kg)  BMI 36.58 kg/m2 General appearance: alert, cooperative and no distress obese. Back: symmetric, no curvature. ROM normal. No CVA tenderness. Tenderness for T5 down to L4. Tender over spinous process and paraspinous tenderness R>L. Gait normal.  UA: trace ketones.   Assessment and Plan:

## 2012-06-29 ENCOUNTER — Encounter: Payer: Medicaid Other | Admitting: Family Medicine

## 2012-07-27 ENCOUNTER — Ambulatory Visit (INDEPENDENT_AMBULATORY_CARE_PROVIDER_SITE_OTHER): Payer: Medicaid Other | Admitting: Family Medicine

## 2012-07-27 ENCOUNTER — Other Ambulatory Visit (HOSPITAL_COMMUNITY)
Admission: RE | Admit: 2012-07-27 | Discharge: 2012-07-27 | Disposition: A | Discharge status: Home / Self Care | Payer: Medicaid Other | Source: Ambulatory Visit | Attending: Family Medicine | Admitting: Family Medicine

## 2012-07-27 ENCOUNTER — Ambulatory Visit (HOSPITAL_COMMUNITY)
Admission: RE | Admit: 2012-07-27 | Discharge: 2012-07-27 | Disposition: A | Discharge status: Home / Self Care | Payer: Medicaid Other | Source: Ambulatory Visit | Attending: Family Medicine | Admitting: Family Medicine

## 2012-07-27 ENCOUNTER — Encounter: Payer: Self-pay | Admitting: Family Medicine

## 2012-07-27 VITALS — BP 127/80 | HR 92 | Ht 62.0 in | Wt 203.8 lb

## 2012-07-27 DIAGNOSIS — R079 Chest pain, unspecified: Secondary | ICD-10-CM

## 2012-07-27 DIAGNOSIS — IMO0001 Reserved for inherently not codable concepts without codable children: Secondary | ICD-10-CM

## 2012-07-27 DIAGNOSIS — N898 Other specified noninflammatory disorders of vagina: Secondary | ICD-10-CM

## 2012-07-27 DIAGNOSIS — Z113 Encounter for screening for infections with a predominantly sexual mode of transmission: Secondary | ICD-10-CM | POA: Insufficient documentation

## 2012-07-27 DIAGNOSIS — Z87898 Personal history of other specified conditions: Secondary | ICD-10-CM

## 2012-07-27 DIAGNOSIS — Z01419 Encounter for gynecological examination (general) (routine) without abnormal findings: Secondary | ICD-10-CM | POA: Insufficient documentation

## 2012-07-27 DIAGNOSIS — Z124 Encounter for screening for malignant neoplasm of cervix: Secondary | ICD-10-CM

## 2012-07-27 DIAGNOSIS — Z309 Encounter for contraceptive management, unspecified: Secondary | ICD-10-CM

## 2012-07-27 DIAGNOSIS — Z8742 Personal history of other diseases of the female genital tract: Secondary | ICD-10-CM

## 2012-07-27 DIAGNOSIS — N939 Abnormal uterine and vaginal bleeding, unspecified: Secondary | ICD-10-CM

## 2012-07-27 DIAGNOSIS — Z20828 Contact with and (suspected) exposure to other viral communicable diseases: Secondary | ICD-10-CM

## 2012-07-27 LAB — POCT WET PREP (WET MOUNT)

## 2012-07-27 MED ORDER — IBUPROFEN 600 MG PO TABS: 600.0000 mg | ORAL_TABLET | Freq: Four times a day (QID) | ORAL | Status: AC | PRN

## 2012-07-27 NOTE — Patient Instructions (Addendum)
For the chest pain, you can take ibuprofen 600mg  every 6 hours as needed with food. If the pain doesn't get better in 2 weeks, come back.  I will call you if any of the lab work is abnormal.

## 2012-07-28 DIAGNOSIS — N939 Abnormal uterine and vaginal bleeding, unspecified: Secondary | ICD-10-CM | POA: Insufficient documentation

## 2012-07-28 DIAGNOSIS — R079 Chest pain, unspecified: Secondary | ICD-10-CM | POA: Insufficient documentation

## 2012-07-28 DIAGNOSIS — Z113 Encounter for screening for infections with a predominantly sexual mode of transmission: Secondary | ICD-10-CM | POA: Insufficient documentation

## 2012-07-28 DIAGNOSIS — Z87898 Personal history of other specified conditions: Secondary | ICD-10-CM

## 2012-07-28 HISTORY — DX: Personal history of other specified conditions: Z87.898

## 2012-07-28 LAB — HIV ANTIBODY (ROUTINE TESTING W REFLEX): HIV: NONREACTIVE

## 2012-07-28 LAB — CBC WITH DIFFERENTIAL/PLATELET
Eosinophils Absolute: 0.1 10*3/uL (ref 0.0–0.7)
Hemoglobin: 12.1 g/dL (ref 12.0–15.0)
Lymphocytes Relative: 45 % (ref 12–46)
Lymphs Abs: 3.5 10*3/uL (ref 0.7–4.0)
MCH: 31.7 pg (ref 26.0–34.0)
MCV: 94 fL (ref 78.0–100.0)
Monocytes Relative: 5 % (ref 3–12)
Neutrophils Relative %: 49 % (ref 43–77)
RBC: 3.82 MIL/uL — ABNORMAL LOW (ref 3.87–5.11)
WBC: 7.7 10*3/uL (ref 4.0–10.5)

## 2012-07-28 NOTE — Assessment & Plan Note (Signed)
On implanon. upt neg. Unclear etiology at this point. Monitor and check CBC to rule out anemia.

## 2012-07-28 NOTE — Assessment & Plan Note (Signed)
H/o CIN1/HPV with colpo. Repeat pap smear today. Also obtain wet prpr

## 2012-07-28 NOTE — Assessment & Plan Note (Addendum)
HIV, RPR, GC, Chl obtained. Strongly encouraged condom use

## 2012-07-28 NOTE — Progress Notes (Signed)
Patient ID: Kathrin Penner, female   DOB: Apr 04, 1986, 26 y.o.   MRN: [id number] Patient ID: RAVENNE WAYMENT    DOB: August 30, 1986, 26 y.o.   MRN: [id number] --- Subjective:  Magda is a 26 y.o.female who presents for pap smear, STD check and complaint of chest pain. - well woman exam: abnormal pap smear: LSIL; CIN1/ HPV in June 2012. Patient reports having had colpo which was normal. Concern for STD's because partner she is with right now has not been faithful since last time she had STD screen. No discharge, only vaginal odor.  - vaginal bleeding: reports one month of spotting (1 pad per day) of dark blood. Stopped yesterday. On implanon x2years. Usually regular periods every month x5-7 days in duration. No light headedness or dizziness.  - chest pain: worst with bending forward, coughing and taking deep breath. Has occurred for 3 months, worst in the last 3 weeks. Has not tried any meds for it. Lasts a few hours. Achy pain.   ROS: see HPI Past Medical History: reviewed and updated medications and allergies. Social History: Tobacco:  Objective: Filed Vitals:   07/27/12 1448  BP: 127/80  Pulse: 92    Physical Examination:   General appearance - alert, well appearing, and in no distress Neck - supple, no significant adenopathy Chest - clear to auscultation, no wheezes, rales or rhonchi, symmetric air entry, reproducible tenderness to palpation along sternum. Large breast.  Heart - normal rate, regular rhythm, normal S1, S2, no murmurs, rubs, clicks or gallops Abdomen - soft, nontender, nondistended, no masses or organomegaly Extremities - peripheral pulses normal, no pedal edema, no clubbing or cyanosis Pelvic exam: normal external genitalia, vulva, vagina, cervix, uterus and adnexa

## 2012-07-28 NOTE — Assessment & Plan Note (Signed)
Likely musculoskeletal in nature. Heavier breasts may be adding weight to musculature. EKG showing sinus arrhythmia, no other abnormality. Patient not hypoxic or tachypneic making PE unlikely. Will treat with NSAIDs with close follow up. Reviewed red flags for return patient.

## 2012-08-03 ENCOUNTER — Encounter: Payer: Self-pay | Admitting: Family Medicine

## 2012-08-19 ENCOUNTER — Ambulatory Visit (INDEPENDENT_AMBULATORY_CARE_PROVIDER_SITE_OTHER): Payer: Medicaid Other | Admitting: Family Medicine

## 2012-08-19 VITALS — BP 127/76 | HR 73 | Temp 98.7°F | Ht 62.0 in | Wt 204.0 lb

## 2012-08-19 DIAGNOSIS — Z23 Encounter for immunization: Secondary | ICD-10-CM

## 2012-08-19 MED ORDER — NORGESTIMATE-ETH ESTRADIOL 0.25-35 MG-MCG PO TABS: 1.0000 | ORAL_TABLET | Freq: Every day | ORAL | Status: DC

## 2012-08-19 NOTE — Progress Notes (Signed)
Patient ID: [name] [name], female   DOB: [birth year], 26 y.o.   MRN: [id number] Here for removal of Implanon. It has been in her arm for 2 years. She has essentially had no problems however she has had no menses until recently when she started having spotting almost daily. The last 2 months she has had spotting. She is also considering getting pregnant again. She currently has 3 children at home ages 11 and one. She desires oral contraceptives as her replacement for the Implanon until she decides on pregnancy. No personal or family history of clots, breast cancer, female cancer. Nonsmoker. Greater than 50% of our 45 minute office visit was spent in counseling and education regarding these issues.  PROCEDURE NOTE: Implanon Removal Patient given informed consent for removal of her implanon.  Signed copy in the chart.  Appropriate time out taken.   Implanon site identified.  Area prepped in usual sterile fashon. One cc of 1% lidocaine was used to anesthetize the area at the distal end of the implant. A small stab incision was made right beside the implant on the distal portion. Even though we thought we had palpated the Implanon, we had great difficulty locating it with the hemostats. We then got the ultrasound machine which finally shows the location.  The implanon rod was grasped using hemostats and removed without difficulty.  There was less than 3 cc blood loss. There were no complications.  A small amount of antibiotic ointment and steri-strips were applied over the small incision.  A pressure bandage was applied to reduce any bruising.  The patient tolerated the procedure well and was given post procedure instructions. Rx called in for OCP and discussed usage.

## 2012-09-20 ENCOUNTER — Inpatient Hospital Stay (HOSPITAL_COMMUNITY)
Admission: AD | Admit: 2012-09-20 | Discharge: 2012-09-20 | Disposition: A | Discharge status: Home / Self Care | Payer: Medicaid Other | Source: Ambulatory Visit | Attending: Obstetrics and Gynecology | Admitting: Obstetrics and Gynecology

## 2012-09-20 ENCOUNTER — Encounter (HOSPITAL_COMMUNITY): Payer: Self-pay | Admitting: *Deleted

## 2012-09-20 DIAGNOSIS — A499 Bacterial infection, unspecified: Secondary | ICD-10-CM | POA: Insufficient documentation

## 2012-09-20 DIAGNOSIS — Z3202 Encounter for pregnancy test, result negative: Secondary | ICD-10-CM | POA: Insufficient documentation

## 2012-09-20 DIAGNOSIS — B9689 Other specified bacterial agents as the cause of diseases classified elsewhere: Secondary | ICD-10-CM | POA: Insufficient documentation

## 2012-09-20 DIAGNOSIS — N644 Mastodynia: Secondary | ICD-10-CM | POA: Insufficient documentation

## 2012-09-20 DIAGNOSIS — N760 Acute vaginitis: Secondary | ICD-10-CM | POA: Insufficient documentation

## 2012-09-20 HISTORY — DX: Unspecified abnormal cytological findings in specimens from cervix uteri: R87.619

## 2012-09-20 HISTORY — DX: Reserved for concepts with insufficient information to code with codable children: IMO0002

## 2012-09-20 LAB — URINALYSIS, ROUTINE W REFLEX MICROSCOPIC
Hgb urine dipstick: NEGATIVE
Leukocytes, UA: NEGATIVE
Nitrite: NEGATIVE
Specific Gravity, Urine: >1.03 — ABNORMAL HIGH (ref 1.005–1.030)
Urobilinogen, UA: 0.2 mg/dL (ref 0.0–1.0)

## 2012-09-20 LAB — POCT PREGNANCY, URINE: Preg Test, Ur: NEGATIVE

## 2012-09-20 LAB — WET PREP, GENITAL
Clue Cells Wet Prep HPF POC: NONE SEEN
Trich, Wet Prep: NONE SEEN
Yeast Wet Prep HPF POC: NONE SEEN

## 2012-09-20 MED ORDER — METRONIDAZOLE 500 MG PO TABS: 500.0000 mg | ORAL_TABLET | Freq: Two times a day (BID) | ORAL | Status: DC

## 2012-09-20 NOTE — MAU Note (Signed)
Patient had an implant removed at [facility] in September.

## 2012-09-20 NOTE — MAU Note (Signed)
Patient states she has been having nausea for about 2 weeks, no vomiting. States her last period was short. Has been feeling tired and having breast soreness. Denies any bleeding or vaginal discharge.

## 2012-09-20 NOTE — MAU Provider Note (Signed)
Subjective:     Patient ID: [name] [name] is a 26 y.o. female.  Chief Complaint: [name] [name] is a 26 year old [id number] female who is currently trying to conceive. She presents today complaining of a 1-week history of nausea, breast pain, abdominal pain, back pain, increased appetite, and increased need for sleep. [name] [name] states that these are the same symptoms she has felt when pregnant in the past and would like to know today if she is currently pregnant. Her last menstrual period was 08/26/12. She had previously been on Implanon, which was removed in September. [name] [name] denies fever, chills, weight loss, vaginal discharge or bleeding, vaginal pain, dysuria, dyspareunia, bowel or bladder dysfunction, vomiting, headache, dizziness, or syncope. [name] [name] describes her abdominal pain as being across her lower abdomen, similar in severity on both sides. She states her low back is sore bilaterally.   Menstrual History: OB History    Grav Para Term Preterm Abortions TAB SAB Ect Mult Living   3 3 3       3        Patient's last menstrual period was 08/26/2012.    Review of Systems Pertinent items are noted in HPI.    Objective:    General appearance: alert, cooperative and no distress Lungs: clear to auscultation bilaterally Heart: regular rate and rhythm, S1, S2 normal, no murmur, click, rub or gallop Abdomen: normal findings: bowel sounds normal, no masses palpable and no organomegaly and abnormal findings:  Mild tenderness to deep palpation in the suprapubic region, RLQ, LLQ. No rebound tenderness or guarding. No peritoneal signs Female Genitourinary: Inspection of external genitalia normal - reveals no suspicious lesion, erythema, or edema. Speculum examination reveals a multiparous cervix with moderate amount of thin white/gray discharge present. No blood or lesions present. No cervical motion tenderness or adnexal tenderness upon bimanual exam. Assessment:     1. Negative pregnancy  test   Patient's urine pregnancy test is negative today. She has not yet missed a period. Patient's symptoms may be due to an early pregnancy that is not yet detectable in the urine or menstrual symptoms.      Plan:     Patient's symptoms are not severe or concerning for ectopic pregnancy, pelvic inflammatory disease, or urinary tract infection. Patient will wait until after her next expected period (10/29) and take another pregnancy test if she is still experiencing these symptoms. Patient advised to return if symptoms change or worsen. Results from GC/Chlamydia DNA probe and wet prep will be communicated to patient and treated appropriately.    I saw and examined patient along with student and agree with above note.   Anne Boltz 10/02/2012 6:11 PM

## 2012-09-21 LAB — GC/CHLAMYDIA PROBE AMP, GENITAL
Chlamydia, DNA Probe: NEGATIVE
GC Probe Amp, Genital: NEGATIVE

## 2012-10-05 NOTE — MAU Provider Note (Signed)
Attestation of Attending Supervision of Advanced Practitioner (CNM/NP): Evaluation and management procedures were performed by the Advanced Practitioner under my supervision and collaboration.  I have reviewed the Advanced Practitioner's note and chart, and I agree with the management and plan.  Porcha Deblanc 10/05/2012 8:27 AM

## 2012-10-27 ENCOUNTER — Ambulatory Visit: Payer: Medicaid Other | Admitting: Family Medicine

## 2012-11-02 ENCOUNTER — Encounter (HOSPITAL_COMMUNITY): Payer: Self-pay | Admitting: *Deleted

## 2012-11-02 ENCOUNTER — Inpatient Hospital Stay (HOSPITAL_COMMUNITY)
Admission: AD | Admit: 2012-11-02 | Discharge: 2012-11-02 | Disposition: A | Discharge status: Home / Self Care | Payer: Medicaid Other | Source: Ambulatory Visit | Attending: Obstetrics & Gynecology | Admitting: Obstetrics & Gynecology

## 2012-11-02 DIAGNOSIS — Z3201 Encounter for pregnancy test, result positive: Secondary | ICD-10-CM | POA: Insufficient documentation

## 2012-11-02 HISTORY — DX: Headache: R51

## 2012-11-02 HISTORY — DX: Urinary tract infection, site not specified: N39.0

## 2012-11-02 HISTORY — DX: Unspecified infectious disease: B99.9

## 2012-11-02 HISTORY — DX: Unspecified ovarian cyst, unspecified side: N83.209

## 2012-11-02 LAB — URINALYSIS, ROUTINE W REFLEX MICROSCOPIC
Bilirubin Urine: NEGATIVE
Hgb urine dipstick: NEGATIVE
Ketones, ur: NEGATIVE mg/dL
Specific Gravity, Urine: 1.025 (ref 1.005–1.030)
Urobilinogen, UA: 0.2 mg/dL (ref 0.0–1.0)

## 2012-11-02 LAB — URINE MICROSCOPIC-ADD ON

## 2012-11-02 NOTE — MAU Note (Addendum)
Breast tenderness for past 2-3 wks., nausea for 2 months.Short period in Nov, only lasted 2 days.  Home test on Sat, "faint line"  Wanted to make sure.

## 2012-11-02 NOTE — MAU Provider Note (Signed)
Attestation of Attending Supervision of Advanced Practitioner (CNM/NP): Evaluation and management procedures were performed by the Advanced Practitioner under my supervision and collaboration.  I have reviewed the Advanced Practitioner's note and chart, and I agree with the management and plan.  HARRAWAY-SMITH, Hymie Gorr 8:48 PM     

## 2012-11-02 NOTE — MAU Provider Note (Signed)
History     CSN: [id number]  Arrival date and time: 11/02/12 4098   None     No chief complaint on file.  HPI 26 y.o. G3P4 at 4.[redacted] weeks ega by LMP. Pt here requesting pregnancy verification and states "I just want an ultrasound to see how far along I am". She also complain of some malodorous vaginal discharge. Denies pain or vaginal bleeding.    Past Medical History  Diagnosis Date  . Headache   . Urinary tract infection   . Ovarian cyst   . Abnormal Pap smear     f/u was normal  . Infection     trich, chlamydia, gonorrhea    Past Surgical History  Procedure Date  . Implanon removal 08/2012 08/2012  . Cesarean section   . Tonsillectomy     Family History  Problem Relation Age of Onset  . Other Neg Hx   . Asthma Brother   . Cancer Maternal Uncle     lung  . Diabetes Maternal Grandmother   . Cancer Maternal Grandmother     thyroid    History  Substance Use Topics  . Smoking status: Current Some Day Smoker    Types: Cigarettes  . Smokeless tobacco: Never Used     Comment: rare  . Alcohol Use: Yes     Comment: occ    Allergies: No Known Allergies  Prescriptions prior to admission  Medication Sig Dispense Refill  . metroNIDAZOLE (FLAGYL) 500 MG tablet Take 1 tablet (500 mg total) by mouth 2 (two) times daily.  14 tablet  0    Review of Systems  Constitutional: Negative.   Respiratory: Negative.   Cardiovascular: Negative.   Gastrointestinal: Negative for nausea, vomiting, abdominal pain, diarrhea and constipation.  Genitourinary: Negative for dysuria, urgency, frequency, hematuria and flank pain.       Negative for vaginal bleeding, + for vaginal discharge   Musculoskeletal: Negative.   Neurological: Negative.   Psychiatric/Behavioral: Negative.    Physical Exam   Blood pressure 134/86, pulse 100, temperature 98.7 F (37.1 C), temperature source Oral, resp. rate 18, height 5' (1.524 m), weight 210 lb (95.255 kg), last menstrual period  10/04/2012.  Physical Exam  Nursing note and vitals reviewed. Constitutional: She is oriented to person, place, and time. She appears well-developed and well-nourished. No distress.  Cardiovascular: Normal rate.   Respiratory: Effort normal.  Musculoskeletal: Normal range of motion.  Neurological: She is alert and oriented to person, place, and time.  Skin: Skin is warm and dry.  Psychiatric: She has a normal mood and affect.    MAU Course  Procedures  Pt waited in lobby >2 hours, no room available, I brought patient back for MSE, pt states she has a PCP, Dr. Gwenlyn Saran at [facility], she says she just wanted pregnancy verification and was hoping for an ultrasound, does not want to wait for further eval today. Pregnancy verification given, informed that there is no medical necessity today for an u/s in the emergency room. Pt states understanding, will f/u for prenatal care at family practice.   Assessment and Plan   1. Positive pregnancy test       Medication List     As of 11/02/2012  3:42 PM    CONTINUE taking these medications         metroNIDAZOLE 500 MG tablet   Commonly known as: FLAGYL   Take 1 tablet (500 mg total) by mouth 2 (two) times daily.  Follow-up Information    Schedule an appointment as soon as possible for a visit with Marena Chancy, MD.   Contact information:   [address] [place] [address] [phone]            Nickola Lenig 11/02/2012, 11:31 AM

## 2012-11-06 ENCOUNTER — Inpatient Hospital Stay (HOSPITAL_COMMUNITY)
Admission: AD | Admit: 2012-11-06 | Discharge: 2012-11-06 | Disposition: A | Discharge status: Home / Self Care | Payer: Medicaid Other | Source: Ambulatory Visit | Attending: Obstetrics & Gynecology | Admitting: Obstetrics & Gynecology

## 2012-11-06 ENCOUNTER — Encounter (HOSPITAL_COMMUNITY): Payer: Self-pay

## 2012-11-06 ENCOUNTER — Inpatient Hospital Stay (HOSPITAL_COMMUNITY): Payer: Medicaid Other

## 2012-11-06 DIAGNOSIS — R300 Dysuria: Secondary | ICD-10-CM | POA: Insufficient documentation

## 2012-11-06 DIAGNOSIS — R109 Unspecified abdominal pain: Secondary | ICD-10-CM | POA: Insufficient documentation

## 2012-11-06 DIAGNOSIS — O26899 Other specified pregnancy related conditions, unspecified trimester: Secondary | ICD-10-CM

## 2012-11-06 DIAGNOSIS — O2390 Unspecified genitourinary tract infection in pregnancy, unspecified trimester: Secondary | ICD-10-CM | POA: Insufficient documentation

## 2012-11-06 DIAGNOSIS — O2340 Unspecified infection of urinary tract in pregnancy, unspecified trimester: Secondary | ICD-10-CM

## 2012-11-06 DIAGNOSIS — N390 Urinary tract infection, site not specified: Secondary | ICD-10-CM | POA: Insufficient documentation

## 2012-11-06 LAB — CBC
HCT: 36.1 % (ref 36.0–46.0)
Hemoglobin: 12 g/dL (ref 12.0–15.0)
WBC: 7.3 10*3/uL (ref 4.0–10.5)

## 2012-11-06 LAB — URINALYSIS, ROUTINE W REFLEX MICROSCOPIC
Ketones, ur: NEGATIVE mg/dL
Nitrite: POSITIVE — AB
Protein, ur: NEGATIVE mg/dL
pH: 6 (ref 5.0–8.0)

## 2012-11-06 LAB — URINE MICROSCOPIC-ADD ON

## 2012-11-06 MED ORDER — NITROFURANTOIN MONOHYD MACRO 100 MG PO CAPS: 100.0000 mg | ORAL_CAPSULE | Freq: Two times a day (BID) | ORAL | Status: DC

## 2012-11-06 NOTE — MAU Note (Signed)
Patient states she started having abdominal pain on 12-5. Has had pain with urination for about 2 weeks and was seen in MAU on 12-3 but left AMA. Denies bleeding, has a little white vaginal discharge. States some nausea, but came in eating food from [employer].

## 2012-11-06 NOTE — ED Provider Notes (Signed)
History     CSN: [id number]  Arrival date and time: 11/06/12 1136    First Provider Initiated Contact with Patient 11/06/12 1354      Chief Complaint  Patient presents with  . Abdominal Pain  . Dysuria   Patient is a 26 y.o. female presenting with abdominal pain and dysuria. The history is provided by the patient. No language interpreter was used.  Abdominal Pain The primary symptoms of the illness include abdominal pain, nausea, dysuria and vaginal discharge. The primary symptoms of the illness do not include fever, vomiting, diarrhea or vaginal bleeding. The current episode started 2 days ago. The onset of the illness was sudden. The problem has been gradually worsening.  The abdominal pain began 2 days ago. The pain came on gradually. The abdominal pain has been gradually worsening since its onset. The abdominal pain is located in the periumbilical region. The abdominal pain does not radiate. The severity of the abdominal pain is 5/10. The abdominal pain is relieved by nothing.  Nausea began more than 1 week ago.  The dysuria began more than 1 week ago. The discomfort is felt in the suprapubic area. The discomfort is moderate. The dysuria is associated with discharge and frequency. The dysuria is not associated with hematuria.  The vaginal discharge is associated with dysuria.  The patient states that she believes she is currently pregnant. The patient has had a change in bowel habit (now having daily soft stools ). Additional symptoms associated with the illness include frequency. Symptoms associated with the illness do not include chills or hematuria.  Dysuria  The current episode started more than 1 week ago. The problem occurs every urination. The problem has been gradually worsening. The quality of the pain is described as burning. The pain is at a severity of 5/10. The pain is moderate. There has been no fever. She is sexually active. Associated symptoms include nausea, discharge  and frequency. Pertinent negatives include no chills, no vomiting and no hematuria.   Patient came into maternity admissions unit complaining of abdominal pain with a bag of fast food in hand. Has her 37 year old daughter present with her.  Patient states that she would like an ultrasound.    Past Medical History  Diagnosis Date  . Headache   . Urinary tract infection   . Ovarian cyst   . Abnormal Pap smear     f/u was normal  . Infection     trich, chlamydia, gonorrhea    Past Surgical History  Procedure Date  . Implanon removal 08/2012 08/2012  . Cesarean section   . Tonsillectomy     Family History  Problem Relation Age of Onset  . Other Neg Hx   . Asthma Brother   . Cancer Maternal Uncle     lung  . Diabetes Maternal Grandmother   . Cancer Maternal Grandmother     thyroid    History  Substance Use Topics  . Smoking status: Current Some Day Smoker    Types: Cigarettes  . Smokeless tobacco: Never Used     Comment: rare  . Alcohol Use: Yes     Comment: occ    Allergies: No Known Allergies  Prescriptions prior to admission  Medication Sig Dispense Refill  . ibuprofen (ADVIL,MOTRIN) 200 MG tablet Take 400 mg by mouth every 6 (six) hours as needed. Head ache      . OVER THE COUNTER MEDICATION Place 1 drop into both eyes daily as needed. Pt uses  an OTC eye drop for allergies as needed; does not know name of medication        Review of Systems  Constitutional: Negative for fever and chills.  HENT: Negative.   Eyes: Negative.   Respiratory: Negative.   Cardiovascular: Negative.   Gastrointestinal: Positive for nausea and abdominal pain. Negative for vomiting and diarrhea.  Genitourinary: Positive for dysuria, frequency and vaginal discharge. Negative for hematuria and vaginal bleeding.  Musculoskeletal: Negative.   Neurological: Negative.   Endo/Heme/Allergies: Negative.    Physical Exam   Blood pressure 132/78, pulse 77, temperature 98.2 F (36.8 C),  temperature source Oral, resp. rate 18, height 5' (1.524 m), weight 212 lb 6.4 oz (96.344 kg), last menstrual period 10/04/2012, SpO2 100.00%.  Physical Exam  Vitals reviewed. Constitutional: She is oriented to person, place, and time. She appears well-developed and well-nourished.  HENT:  Head: Normocephalic.  Neck: Normal range of motion.  Respiratory: Effort normal.  GI: Soft. She exhibits no shifting dullness and no distension. There is tenderness in the suprapubic area. There is no guarding.  Genitourinary: Vagina normal and uterus normal. There is no rash, tenderness, lesion or injury on the right labia. There is no rash, tenderness, lesion or injury on the left labia. Right adnexum displays no mass, no tenderness and no fullness. Left adnexum displays no mass, no tenderness and no fullness.       No bleeding. Has discharge.   Musculoskeletal: Normal range of motion.  Neurological: She is alert and oriented to person, place, and time.  Skin: Skin is warm and dry.   Results for orders placed during the hospital encounter of 11/06/12 (from the past 24 hour(s))  URINALYSIS, ROUTINE W REFLEX MICROSCOPIC     Status: Abnormal   Collection Time   11/06/12 11:57 AM      Component Value Range   Color, Urine YELLOW  YELLOW   APPearance HAZY (*) CLEAR   Specific Gravity, Urine >1.030 (*) 1.005 - 1.030   pH 6.0  5.0 - 8.0   Glucose, UA NEGATIVE  NEGATIVE mg/dL   Hgb urine dipstick TRACE (*) NEGATIVE   Bilirubin Urine NEGATIVE  NEGATIVE   Ketones, ur NEGATIVE  NEGATIVE mg/dL   Protein, ur NEGATIVE  NEGATIVE mg/dL   Urobilinogen, UA 0.2  0.0 - 1.0 mg/dL   Nitrite POSITIVE (*) NEGATIVE   Leukocytes, UA SMALL (*) NEGATIVE  URINE MICROSCOPIC-ADD ON     Status: Abnormal   Collection Time   11/06/12 11:57 AM      Component Value Range   Squamous Epithelial / LPF RARE  RARE   WBC, UA 3-6  <3 WBC/hpf   RBC / HPF 0-2  <3 RBC/hpf   Bacteria, UA MANY (*) RARE  CBC     Status: Abnormal    Collection Time   11/06/12  2:30 PM      Component Value Range   WBC 7.3  4.0 - 10.5 K/uL   RBC 3.80 (*) 3.87 - 5.11 MIL/uL   Hemoglobin 12.0  12.0 - 15.0 g/dL   HCT 14.7  82.9 - 56.2 %   MCV 95.0  78.0 - 100.0 fL   MCH 31.6  26.0 - 34.0 pg   MCHC 33.2  30.0 - 36.0 g/dL   RDW 13.0  86.5 - 78.4 %   Platelets 350  150 - 400 K/uL  HCG, QUANTITATIVE, PREGNANCY     Status: Abnormal   Collection Time   11/06/12  2:30 PM  Component Value Range   hCG, Beta Chain, Quant, S 1004 (*) <5 mIU/mL  WET PREP, GENITAL     Status: Abnormal   Collection Time   11/06/12  2:42 PM      Component Value Range   Yeast Wet Prep HPF POC NONE SEEN  NONE SEEN   Trich, Wet Prep NONE SEEN  NONE SEEN   Clue Cells Wet Prep HPF POC NONE SEEN  NONE SEEN   WBC, Wet Prep HPF POC FEW (*) NONE SEEN   *RADIOLOGY REPORT*  Clinical Data: 26 year old pregnant female with pelvic pain and  cramping. Beta HCG pending.  OBSTETRIC <14 WK [place] AND TRANSVAGINAL OB US  Technique: Both transabdominal and transvaginal ultrasound  examinations were performed for complete evaluation of the  gestation as well as the maternal uterus, adnexal regions, and  pelvic cul-de-sac. Transvaginal technique was performed to assess  early pregnancy.  Comparison: None  Intrauterine gestational sac: A small cystic structure with  probable adjacent decidual reaction is noted towards the fundus and  probably represents an intrauterine gestational sac.  Yolk sac: Not visualized  Embryo: Not visualized  MSD: 3.1 mm 4 w 5 d [place] EDC: 07/11/2013  Maternal uterus/adnexae:  A moderate subchorionic hemorrhage is noted.  The ovaries bilaterally are unremarkable.  There is no evidence of free fluid or adnexal mass.  IMPRESSION:  Probable single intrauterine gestational sac with estimated  gestational age by mean sac diameter of 4 weeks 5 days by this  ultrasound.  Moderate subchorionic hemorrhage.  No evidence of yolk sac or embryo at this time.  Follow-up as  clinically indicated.  Original Report Authenticated By: Harmon Pier, M.D.   MAU Course  Procedures Previous records indicate blood type is A- NEgative Orders placed for CBC, Ultrasound, and beta HCG.    MDM Discussed with the patient ultrasound and lab results. Discussed plan of care for return visit in 48 hours.  Ectopic precautions given. Pelvic rest until bleeding stops  Assessment and Plan  Patient unsure of last menstral period. States that she had slight bleeding x2 days at the first of the month. Pelvic done. No blood present. Has a discharge. Negative for adnexal pain.    A: Urinary tract infection      Abdominal pain in early pregnancy        P: Macrobid 1 tab BID x1 week (encouraged to take all pills)     Pelvic rest     Follow up in 48 hours on Monday 11/08/12 for repeat qualitative Beta HCG     Phone number given for family practice clinic [phone]   Patient states that she plans to go to [facility] family practice for prenatal care.  She says that she has an appointment scheduled on 12/23 and 12/30.   Alveta Heimlich 11/06/2012, 1:55 PM    I have discussed the patient's history, chief complaint, observed the  physical exam and plan of care with the resident/student and agree with their findings.    Matt Holmes Family Nurse Practitioner  Elta Guadeloupe, NP 11/06/12 [phone]

## 2012-11-07 LAB — GC/CHLAMYDIA PROBE AMP: GC Probe RNA: NEGATIVE

## 2012-11-10 LAB — URINE CULTURE

## 2012-11-22 ENCOUNTER — Other Ambulatory Visit: Payer: Medicaid Other

## 2012-11-22 DIAGNOSIS — Z331 Pregnant state, incidental: Secondary | ICD-10-CM

## 2012-11-22 LAB — HIV ANTIBODY (ROUTINE TESTING W REFLEX): HIV: NONREACTIVE

## 2012-11-22 NOTE — Progress Notes (Signed)
PRNATAL LABS DONE TODAY [name] [name]

## 2012-11-23 LAB — OBSTETRIC PANEL
Eosinophils Relative: 2 % (ref 0–5)
HCT: 36.4 % (ref 36.0–46.0)
Lymphocytes Relative: 41 % (ref 12–46)
Lymphs Abs: 2.4 10*3/uL (ref 0.7–4.0)
MCV: 95.8 fL (ref 78.0–100.0)
Monocytes Absolute: 0.3 10*3/uL (ref 0.1–1.0)
Platelets: 394 10*3/uL (ref 150–400)
RBC: 3.8 MIL/uL — ABNORMAL LOW (ref 3.87–5.11)
Rubella: 2.88 Index — ABNORMAL HIGH (ref <0.90)
WBC: 5.8 10*3/uL (ref 4.0–10.5)

## 2012-11-29 ENCOUNTER — Encounter: Payer: Self-pay | Admitting: Family Medicine

## 2012-11-29 ENCOUNTER — Ambulatory Visit (INDEPENDENT_AMBULATORY_CARE_PROVIDER_SITE_OTHER): Payer: Medicaid Other | Admitting: Family Medicine

## 2012-11-29 VITALS — BP 130/82 | Wt 216.0 lb

## 2012-11-29 DIAGNOSIS — Z3480 Encounter for supervision of other normal pregnancy, unspecified trimester: Secondary | ICD-10-CM

## 2012-11-29 MED ORDER — POLYETHYLENE GLYCOL 3350 17 GM/SCOOP PO POWD: 17.0000 g | Freq: Every day | ORAL | Status: DC

## 2012-11-29 NOTE — Progress Notes (Signed)
Subjective:    [name] [name] is being seen today for her first obstetrical visit.  This is not a planned pregnancy. She is at [redacted]w[redacted]d gestation. Her obstetrical history is significant for obesity and previous C-section in first pregnancy, but followed by 2 successful VBACs. . Pregnancy history fully reviewed. Concerns include the following: - lower abdominal cramping, worst with walking, occurs every day for a few minutes at a time, has been occuring for 1 week. No vaginal bleeding, no discharge. No vomiting. Some nausea.  - foot pain: bilateral foot pain at bottom of foot, medial aspect of foot and top of foot. Worst with walking. X 2 weeks. Has not tried anything for it.  - constipation: BM every 203 days, hard pellets, blood with wiping occasionally. Had problem with constipation and hemorrhoids in previous pregnancy.  Menstrual History: OB History    Grav Para Term Preterm Abortions TAB SAB Ect Mult Living   4 3 3       3        Patient's last menstrual period was 10/04/2012. irregular periods     Review of Systems Pertinent items are noted in HPI.    Objective:    BP 130/82  Wt 216 lb (97.977 kg)  LMP 10/04/2012 Physical Examination: General appearance - alert, well appearing, and in no distress Chest - clear to auscultation, no wheezes, rales or rhonchi, symmetric air entry Heart - normal rate, regular rhythm, normal S1, S2, no murmurs, rubs, clicks or gallops Abdomen - soft, mildly tender in both lower quadrants, no rebound or guarding Pelvic - normal appearing external genitalia, no lesions, mild cervical motion tendernss, 1cm dilated cervix  Musculoskeletal - no joint tenderness, deformity or swelling Extremities - no edema Skin - normal coloration and turgor, no rashes, no suspicious skin lesions noted            Assessment:    G4P3003 at  8wga by [place]   Plan:    Initial labs drawn and reviewed: normal  - PAP: 07/27/12 within normal limits. History of LSIL/CIN1 HPV  positive in 05/15/2011. Patient reports colpo which was normal. Will not obtain PAP today since recent PAP normal.  - GC/Chl from 11/06/12 normal - Abdominal pain: will check ultrasound to rule out ectopic. Patient was supposed to get repeat beta HcG but did not. Beta HcG at time of diagnosis of pregnancy was 1004. [place] that was done could not properly visualize gestational sack. Red flags for return reviewed. - pregnancy home form completed and significant for substance abuse in partner. Will follow up on possible resources. Phq9: 17, 2 points for 6,7, three points for 1.3.,4,5 - foot pain: treat with tylenol - constipation: miralax and hydration - obese and african -american: obtain early glucola today Prenatal vitamins. AFP3 not discussed.  Follow up in 2 weeks.Marland Kitchen

## 2012-11-29 NOTE — Patient Instructions (Addendum)
We are going to get an ultrasound to see what gestational age you are at.   For the constipation, you can take miralax once daily until you have soft stools.   For the foot pain, you can take tylenol.   Pregnancy - First Trimester During sexual intercourse, millions of sperm go into the vagina. Only 1 sperm will penetrate and fertilize the female egg while it is in the Fallopian tube. One week later, the fertilized egg implants into the wall of the uterus. An embryo begins to develop into a baby. At 6 to 8 weeks, the eyes and face are formed and the heartbeat can be seen on ultrasound. At the end of 12 weeks (first trimester), all the baby's organs are formed. Now that you are pregnant, you will want to do everything you can to have a healthy baby. Two of the most important things are to get good prenatal care and follow your caregiver's instructions. Prenatal care is all the medical care you receive before the baby's birth. It is given to prevent, find, and treat problems during the pregnancy and childbirth. PRENATAL EXAMS  During prenatal visits, your weight, blood pressure and urine are checked. This is done to make sure you are healthy and progressing normally during the pregnancy.  A pregnant woman should gain 25 to 35 pounds during the pregnancy. However, if you are over weight or underweight, your caregiver will advise you regarding your weight.  Your caregiver will ask and answer questions for you.  Blood work, cervical cultures, other necessary tests and a Pap test are done during your prenatal exams. These tests are done to check on your health and the probable health of your baby. Tests are strongly recommended and done for HIV with your permission. This is the virus that causes AIDS. These tests are done because medications can be given to help prevent your baby from being born with this infection should you have been infected without knowing it. Blood work is also used to find out your  blood type, previous infections and follow your blood levels (hemoglobin).  Low hemoglobin (anemia) is common during pregnancy. Iron and vitamins are given to help prevent this. Later in the pregnancy, blood tests for diabetes will be done along with any other tests if any problems develop. You may need tests to make sure you and the baby are doing well.  You may need other tests to make sure you and the baby are doing well. CHANGES DURING THE FIRST TRIMESTER (THE FIRST 3 MONTHS OF PREGNANCY) Your body goes through many changes during pregnancy. They vary from person to person. Talk to your caregiver about changes you notice and are concerned about. Changes can include:  Your menstrual period stops.  The egg and sperm carry the genes that determine what you look like. Genes from you and your partner are forming a baby. The female genes determine whether the baby is a boy or a girl.  Your body increases in girth and you may feel bloated.  Feeling sick to your stomach (nauseous) and throwing up (vomiting). If the vomiting is uncontrollable, call your caregiver.  Your breasts will begin to enlarge and become tender.  Your nipples may stick out more and become darker.  The need to urinate more. Painful urination may mean you have a bladder infection.  Tiring easily.  Loss of appetite.  Cravings for certain kinds of food.  At first, you may gain or lose a couple of pounds.  You  may have changes in your emotions from day to day (excited to be pregnant or concerned something may go wrong with the pregnancy and baby).  You may have more vivid and strange dreams. HOME CARE INSTRUCTIONS   It is very important to avoid all smoking, alcohol and un-prescribed drugs during your pregnancy. These affect the formation and growth of the baby. Avoid chemicals while pregnant to ensure the delivery of a healthy infant.  Start your prenatal visits by the 12th week of pregnancy. They are usually  scheduled monthly at first, then more often in the last 2 months before delivery. Keep your caregiver's appointments. Follow your caregiver's instructions regarding medication use, blood and lab tests, exercise, and diet.  During pregnancy, you are providing food for you and your baby. Eat regular, well-balanced meals. Choose foods such as meat, fish, milk and other low fat dairy products, vegetables, fruits, and whole-grain breads and cereals. Your caregiver will tell you of the ideal weight gain.  You can help morning sickness by keeping soda crackers at the bedside. Eat a couple before arising in the morning. You may want to use the crackers without salt on them.  Eating 4 to 5 small meals rather than 3 large meals a day also may help the nausea and vomiting.  Drinking liquids between meals instead of during meals also seems to help nausea and vomiting.  A physical sexual relationship may be continued throughout pregnancy if there are no other problems. Problems may be early (premature) leaking of amniotic fluid from the membranes, vaginal bleeding, or belly (abdominal) pain.  Exercise regularly if there are no restrictions. Check with your caregiver or physical therapist if you are unsure of the safety of some of your exercises. Greater weight gain will occur in the last 2 trimesters of pregnancy. Exercising will help:  Control your weight.  Keep you in shape.  Prepare you for labor and delivery.  Help you lose your pregnancy weight after you deliver your baby.  Wear a good support or jogging bra for breast tenderness during pregnancy. This may help if worn during sleep too.  Ask when prenatal classes are available. Begin classes when they are offered.  Do not use hot tubs, steam rooms or saunas.  Wear your seat belt when driving. This protects you and your baby if you are in an accident.  Avoid raw meat, uncooked cheese, cat litter boxes and soil used by cats throughout the  pregnancy. These carry germs that can cause birth defects in the baby.  The first trimester is a good time to visit your dentist for your dental health. Getting your teeth cleaned is OK. Use a softer toothbrush and brush gently during pregnancy.  Ask for help if you have financial, counseling or nutritional needs during pregnancy. Your caregiver will be able to offer counseling for these needs as well as refer you for other special needs.  Do not take any medications or herbs unless told by your caregiver.  Inform your caregiver if there is any mental or physical domestic violence.  Make a list of emergency phone numbers of family, friends, hospital, and police and fire departments.  Write down your questions. Take them to your prenatal visit.  Do not douche.  Do not cross your legs.  If you have to stand for long periods of time, rotate you feet or take small steps in a circle.  You may have more vaginal secretions that may require a sanitary pad. Do not use tampons  or scented sanitary pads. MEDICATIONS AND DRUG USE IN PREGNANCY  Take prenatal vitamins as directed. The vitamin should contain 1 milligram of folic acid. Keep all vitamins out of reach of children. Only a couple vitamins or tablets containing iron may be fatal to a baby or young child when ingested.  Avoid use of all medications, including herbs, over-the-counter medications, not prescribed or suggested by your caregiver. Only take over-the-counter or prescription medicines for pain, discomfort, or fever as directed by your caregiver. Do not use aspirin, ibuprofen, or naproxen unless directed by your caregiver.  Let your caregiver also know about herbs you may be using.  Alcohol is related to a number of birth defects. This includes fetal alcohol syndrome. All alcohol, in any form, should be avoided completely. Smoking will cause low birth rate and premature babies.  Street or illegal drugs are very harmful to the baby.  They are absolutely forbidden. A baby born to an addicted mother will be addicted at birth. The baby will go through the same withdrawal an adult does.  Let your caregiver know about any medications that you have to take and for what reason you take them. MISCARRIAGE IS COMMON DURING PREGNANCY A miscarriage does not mean you did something wrong. It is not a reason to worry about getting pregnant again. Your caregiver will help you with questions you may have. If you have a miscarriage, you may need minor surgery. SEEK MEDICAL CARE IF:  You have any concerns or worries during your pregnancy. It is better to call with your questions if you feel they cannot wait, rather than worry about them. SEEK IMMEDIATE MEDICAL CARE IF:   An unexplained oral temperature above 102 F (38.9 C) develops, or as your caregiver suggests.  You have leaking of fluid from the vagina (birth canal). If leaking membranes are suspected, take your temperature and inform your caregiver of this when you call.  There is vaginal spotting or bleeding. Notify your caregiver of the amount and how many pads are used.  You develop a bad smelling vaginal discharge with a change in the color.  You continue to feel sick to your stomach (nauseated) and have no relief from remedies suggested. You vomit blood or coffee ground-like materials.  You lose more than 2 pounds of weight in 1 week.  You gain more than 2 pounds of weight in 1 week and you notice swelling of your face, hands, feet, or legs.  You gain 5 pounds or more in 1 week (even if you do not have swelling of your hands, face, legs, or feet).  You get exposed to [place] measles and have never had them.  You are exposed to fifth disease or chickenpox.  You develop belly (abdominal) pain. Round ligament discomfort is a common non-cancerous (benign) cause of abdominal pain in pregnancy. Your caregiver still must evaluate this.  You develop headache, fever, diarrhea, pain  with urination, or shortness of breath.  You fall or are in a car accident or have any kind of trauma.  There is mental or physical violence in your home. Document Released: 11/11/2001 Document Revised: 02/09/2012 Document Reviewed: 05/15/2009 [facility] Patient Information 2013 [place], [place].

## 2012-11-29 NOTE — Progress Notes (Addendum)
Chart reviewed.  Agree with Dr. Whitney Muse plan and note. Issues noted: Rh negative - needs Rhogam at 28 weeks. H/o section with successful VBAC X2.  Needs TOLAC counseling at 30 weeks. Unsure dates - irreg menses, early ultrasound too early to see embryo.  Dating sono already scheduled. Obesity - should be counseled re: weight gain in pregnancy.  Passed early glucola.  Needs repeat at 24-28 weeks. Smoker - should be counseled. Due for flu shot.

## 2012-12-01 NOTE — L&D Delivery Note (Signed)
Delivery Note At 10:31 AM a viable female was delivered via VBAC, Spontaneous (Presentation: Right Occiput Anterior). Spontaneous cry.  APGAR: 8, 9; weight 7 lb 11.5 oz (3501 g).   Placenta status: Intact, Spontaneous.  Cord: 3 vessels with the following complications: None.     Anesthesia: Epidural  Episiotomy: None Lacerations: None Est. Blood Loss (mL): 250  Pt was sitting for epidural and delivered precipitously in the bed.  I arrived after cord was clamped and cut. Baby with spontaneous cry. Placenta was delivered without complication. No tears.  Baby with 6 fingers on each hand.    Mom to postpartum.  Baby to nursery-stable.  Shylah Dossantos L 07/13/2013, 10:56 AM

## 2012-12-02 ENCOUNTER — Ambulatory Visit (HOSPITAL_COMMUNITY)
Admission: RE | Admit: 2012-12-02 | Discharge: 2012-12-02 | Disposition: A | Discharge status: Home / Self Care | Payer: Medicaid Other | Source: Ambulatory Visit | Attending: Family Medicine | Admitting: Family Medicine

## 2012-12-02 DIAGNOSIS — Z3480 Encounter for supervision of other normal pregnancy, unspecified trimester: Secondary | ICD-10-CM

## 2012-12-02 DIAGNOSIS — O34219 Maternal care for unspecified type scar from previous cesarean delivery: Secondary | ICD-10-CM | POA: Insufficient documentation

## 2012-12-02 DIAGNOSIS — Z3689 Encounter for other specified antenatal screening: Secondary | ICD-10-CM | POA: Insufficient documentation

## 2012-12-04 ENCOUNTER — Encounter: Payer: Self-pay | Admitting: Family Medicine

## 2012-12-27 ENCOUNTER — Ambulatory Visit (INDEPENDENT_AMBULATORY_CARE_PROVIDER_SITE_OTHER): Payer: Medicaid Other | Admitting: Family Medicine

## 2012-12-27 VITALS — BP 121/82 | Wt 220.0 lb

## 2012-12-27 DIAGNOSIS — Z3490 Encounter for supervision of normal pregnancy, unspecified, unspecified trimester: Secondary | ICD-10-CM

## 2012-12-27 DIAGNOSIS — Z3480 Encounter for supervision of other normal pregnancy, unspecified trimester: Secondary | ICD-10-CM

## 2012-12-27 MED ORDER — VITAMIN B-6 25 MG PO TABS: 25.0000 mg | ORAL_TABLET | Freq: Three times a day (TID) | ORAL | Status: DC

## 2012-12-27 NOTE — Progress Notes (Signed)
S: cramping abdominal pain, in the middle of abdomen. Lasts up to half a day, more with activity.  Nausea: since the beginning of pregnancy. No vomiting in the last week. Feels faint when standing up. Drinking water 32 oz per day. Also drinking soda or cool aid.  Pink when wiping on tissue 1 week ago. Since then, no more bleeding.  O: see flowsheet. Cardiac activity observed with office ultrasound.  A/P: 27 yo G4P3003 at 12.0 wga by LMP who presents for routine prenatal care.  - cramping appears to be musculoskeletal in nature given her description of the pain being worst after activity. Tylenol as needed for pain - nausea: no weight loss associated with it. B6 sent to pharmacy - weight gain: 4 lbs in last 4 weeks. Explained to [name] [name] the importance of not gaining too much weight in pregnancy and risk of macrosomic baby. She feels like she has already cut down on coolaid and soda and doesn't want to cut back on the 1 soda a day that she drinks. She thinks she might be able to cut down on going out and try to cook more.  - if anymore vaginal bleeding noticed other than a one time slight pink color on toilet paper, patient to return for further eval.  - follow up in 4 weeks - patient refused flu shot after risks of the flu in pregnant women were explicitly stated.

## 2012-12-27 NOTE — Patient Instructions (Addendum)
Like we talked about, it will be good to cut down on the take out food, so that you keep your weight the same.   For the nausea, I am sending a prescription for vitamin B6.    Pregnancy - First Trimester During sexual intercourse, millions of sperm go into the vagina. Only 1 sperm will penetrate and fertilize the female egg while it is in the Fallopian tube. One week later, the fertilized egg implants into the wall of the uterus. An embryo begins to develop into a baby. At 6 to 8 weeks, the eyes and face are formed and the heartbeat can be seen on ultrasound. At the end of 12 weeks (first trimester), all the baby's organs are formed. Now that you are pregnant, you will want to do everything you can to have a healthy baby. Two of the most important things are to get good prenatal care and follow your caregiver's instructions. Prenatal care is all the medical care you receive before the baby's birth. It is given to prevent, find, and treat problems during the pregnancy and childbirth. PRENATAL EXAMS  During prenatal visits, your weight, blood pressure and urine are checked. This is done to make sure you are healthy and progressing normally during the pregnancy.  A pregnant woman should gain 25 to 35 pounds during the pregnancy. However, if you are over weight or underweight, your caregiver will advise you regarding your weight.  Your caregiver will ask and answer questions for you.  Blood work, cervical cultures, other necessary tests and a Pap test are done during your prenatal exams. These tests are done to check on your health and the probable health of your baby. Tests are strongly recommended and done for HIV with your permission. This is the virus that causes AIDS. These tests are done because medications can be given to help prevent your baby from being born with this infection should you have been infected without knowing it. Blood work is also used to find out your blood type, previous  infections and follow your blood levels (hemoglobin).  Low hemoglobin (anemia) is common during pregnancy. Iron and vitamins are given to help prevent this. Later in the pregnancy, blood tests for diabetes will be done along with any other tests if any problems develop. You may need tests to make sure you and the baby are doing well.  You may need other tests to make sure you and the baby are doing well. CHANGES DURING THE FIRST TRIMESTER (THE FIRST 3 MONTHS OF PREGNANCY) Your body goes through many changes during pregnancy. They vary from person to person. Talk to your caregiver about changes you notice and are concerned about. Changes can include:  Your menstrual period stops.  The egg and sperm carry the genes that determine what you look like. Genes from you and your partner are forming a baby. The female genes determine whether the baby is a boy or a girl.  Your body increases in girth and you may feel bloated.  Feeling sick to your stomach (nauseous) and throwing up (vomiting). If the vomiting is uncontrollable, call your caregiver.  Your breasts will begin to enlarge and become tender.  Your nipples may stick out more and become darker.  The need to urinate more. Painful urination may mean you have a bladder infection.  Tiring easily.  Loss of appetite.  Cravings for certain kinds of food.  At first, you may gain or lose a couple of pounds.  You may have changes  in your emotions from day to day (excited to be pregnant or concerned something may go wrong with the pregnancy and baby).  You may have more vivid and strange dreams. HOME CARE INSTRUCTIONS   It is very important to avoid all smoking, alcohol and un-prescribed drugs during your pregnancy. These affect the formation and growth of the baby. Avoid chemicals while pregnant to ensure the delivery of a healthy infant.  Start your prenatal visits by the 12th week of pregnancy. They are usually scheduled monthly at first,  then more often in the last 2 months before delivery. Keep your caregiver's appointments. Follow your caregiver's instructions regarding medication use, blood and lab tests, exercise, and diet.  During pregnancy, you are providing food for you and your baby. Eat regular, well-balanced meals. Choose foods such as meat, fish, milk and other low fat dairy products, vegetables, fruits, and whole-grain breads and cereals. Your caregiver will tell you of the ideal weight gain.  You can help morning sickness by keeping soda crackers at the bedside. Eat a couple before arising in the morning. You may want to use the crackers without salt on them.  Eating 4 to 5 small meals rather than 3 large meals a day also may help the nausea and vomiting.  Drinking liquids between meals instead of during meals also seems to help nausea and vomiting.  A physical sexual relationship may be continued throughout pregnancy if there are no other problems. Problems may be early (premature) leaking of amniotic fluid from the membranes, vaginal bleeding, or belly (abdominal) pain.  Exercise regularly if there are no restrictions. Check with your caregiver or physical therapist if you are unsure of the safety of some of your exercises. Greater weight gain will occur in the last 2 trimesters of pregnancy. Exercising will help:  Control your weight.  Keep you in shape.  Prepare you for labor and delivery.  Help you lose your pregnancy weight after you deliver your baby.  Wear a good support or jogging bra for breast tenderness during pregnancy. This may help if worn during sleep too.  Ask when prenatal classes are available. Begin classes when they are offered.  Do not use hot tubs, steam rooms or saunas.  Wear your seat belt when driving. This protects you and your baby if you are in an accident.  Avoid raw meat, uncooked cheese, cat litter boxes and soil used by cats throughout the pregnancy. These carry germs that  can cause birth defects in the baby.  The first trimester is a good time to visit your dentist for your dental health. Getting your teeth cleaned is OK. Use a softer toothbrush and brush gently during pregnancy.  Ask for help if you have financial, counseling or nutritional needs during pregnancy. Your caregiver will be able to offer counseling for these needs as well as refer you for other special needs.  Do not take any medications or herbs unless told by your caregiver.  Inform your caregiver if there is any mental or physical domestic violence.  Make a list of emergency phone numbers of family, friends, hospital, and police and fire departments.  Write down your questions. Take them to your prenatal visit.  Do not douche.  Do not cross your legs.  If you have to stand for long periods of time, rotate you feet or take small steps in a circle.  You may have more vaginal secretions that may require a sanitary pad. Do not use tampons or scented sanitary  pads. MEDICATIONS AND DRUG USE IN PREGNANCY  Take prenatal vitamins as directed. The vitamin should contain 1 milligram of folic acid. Keep all vitamins out of reach of children. Only a couple vitamins or tablets containing iron may be fatal to a baby or young child when ingested.  Avoid use of all medications, including herbs, over-the-counter medications, not prescribed or suggested by your caregiver. Only take over-the-counter or prescription medicines for pain, discomfort, or fever as directed by your caregiver. Do not use aspirin, ibuprofen, or naproxen unless directed by your caregiver.  Let your caregiver also know about herbs you may be using.  Alcohol is related to a number of birth defects. This includes fetal alcohol syndrome. All alcohol, in any form, should be avoided completely. Smoking will cause low birth rate and premature babies.  Street or illegal drugs are very harmful to the baby. They are absolutely forbidden. A  baby born to an addicted mother will be addicted at birth. The baby will go through the same withdrawal an adult does.  Let your caregiver know about any medications that you have to take and for what reason you take them. MISCARRIAGE IS COMMON DURING PREGNANCY A miscarriage does not mean you did something wrong. It is not a reason to worry about getting pregnant again. Your caregiver will help you with questions you may have. If you have a miscarriage, you may need minor surgery. SEEK MEDICAL CARE IF:  You have any concerns or worries during your pregnancy. It is better to call with your questions if you feel they cannot wait, rather than worry about them. SEEK IMMEDIATE MEDICAL CARE IF:   An unexplained oral temperature above 102 F (38.9 C) develops, or as your caregiver suggests.  You have leaking of fluid from the vagina (birth canal). If leaking membranes are suspected, take your temperature and inform your caregiver of this when you call.  There is vaginal spotting or bleeding. Notify your caregiver of the amount and how many pads are used.  You develop a bad smelling vaginal discharge with a change in the color.  You continue to feel sick to your stomach (nauseated) and have no relief from remedies suggested. You vomit blood or coffee ground-like materials.  You lose more than 2 pounds of weight in 1 week.  You gain more than 2 pounds of weight in 1 week and you notice swelling of your face, hands, feet, or legs.  You gain 5 pounds or more in 1 week (even if you do not have swelling of your hands, face, legs, or feet).  You get exposed to [place] measles and have never had them.  You are exposed to fifth disease or chickenpox.  You develop belly (abdominal) pain. Round ligament discomfort is a common non-cancerous (benign) cause of abdominal pain in pregnancy. Your caregiver still must evaluate this.  You develop headache, fever, diarrhea, pain with urination, or shortness of  breath.  You fall or are in a car accident or have any kind of trauma.  There is mental or physical violence in your home. Document Released: 11/11/2001 Document Revised: 02/09/2012 Document Reviewed: 05/15/2009 [facility] Patient Information 2013 [place], [place].

## 2013-01-26 ENCOUNTER — Ambulatory Visit (INDEPENDENT_AMBULATORY_CARE_PROVIDER_SITE_OTHER): Payer: Medicaid Other | Admitting: Family Medicine

## 2013-01-26 ENCOUNTER — Encounter: Payer: Medicaid Other | Admitting: Family Medicine

## 2013-01-26 VITALS — BP 129/64 | Temp 98.6°F | Wt 221.1 lb

## 2013-01-26 DIAGNOSIS — Z3490 Encounter for supervision of normal pregnancy, unspecified, unspecified trimester: Secondary | ICD-10-CM

## 2013-01-26 DIAGNOSIS — Z3480 Encounter for supervision of other normal pregnancy, unspecified trimester: Secondary | ICD-10-CM | POA: Insufficient documentation

## 2013-01-26 NOTE — Progress Notes (Signed)
S: cramping in lower abdomen still present. No change since previous. No vaginal bleeding, no discharge. Not sure if feeling fetal movement. Hip and buttocks hurting more with walking, better with sitting. Used tylenol which did not work.  O: see flowsheet A/P: 27 yo G4P3003 at 16.2wga who presents for routine prenatal care. - hip pain: heating pads and tylenol - quad screen to be obtained during lab visit - schedule appointment for anatomy US in 3 weeks - follow up in 4 weeks.

## 2013-01-26 NOTE — Patient Instructions (Addendum)
Warm compresses for the back pain.   Pregnancy - Second Trimester The second trimester of pregnancy (3 to 6 months) is a period of rapid growth for you and your baby. At the end of the sixth month, your baby is about 9 inches long and weighs 1 1/2 pounds. You will begin to feel the baby move between 18 and 20 weeks of the pregnancy. This is called quickening. Weight gain is faster. A clear fluid (colostrum) may leak out of your breasts. You may feel small contractions of the womb (uterus). This is known as false labor or Braxton-Hicks contractions. This is like a practice for labor when the baby is ready to be born. Usually, the problems with morning sickness have usually passed by the end of your first trimester. Some women develop small dark blotches (called cholasma, mask of pregnancy) on their face that usually goes away after the baby is born. Exposure to the sun makes the blotches worse. Acne may also develop in some pregnant women and pregnant women who have acne, may find that it goes away. PRENATAL EXAMS  Blood work may continue to be done during prenatal exams. These tests are done to check on your health and the probable health of your baby. Blood work is used to follow your blood levels (hemoglobin). Anemia (low hemoglobin) is common during pregnancy. Iron and vitamins are given to help prevent this. You will also be checked for diabetes between 24 and 28 weeks of the pregnancy. Some of the previous blood tests may be repeated.  The size of the uterus is measured during each visit. This is to make sure that the baby is continuing to grow properly according to the dates of the pregnancy.  Your blood pressure is checked every prenatal visit. This is to make sure you are not getting toxemia.  Your urine is checked to make sure you do not have an infection, diabetes or protein in the urine.  Your weight is checked often to make sure gains are happening at the suggested rate. This is to ensure  that both you and your baby are growing normally.  Sometimes, an ultrasound is performed to confirm the proper growth and development of the baby. This is a test which bounces harmless sound waves off the baby so your caregiver can more accurately determine due dates. Sometimes, a specialized test is done on the amniotic fluid surrounding the baby. This test is called an amniocentesis. The amniotic fluid is obtained by sticking a needle into the belly (abdomen). This is done to check the chromosomes in instances where there is a concern about possible genetic problems with the baby. It is also sometimes done near the end of pregnancy if an early delivery is required. In this case, it is done to help make sure the baby's lungs are mature enough for the baby to live outside of the womb. CHANGES OCCURING IN THE SECOND TRIMESTER OF PREGNANCY Your body goes through many changes during pregnancy. They vary from person to person. Talk to your caregiver about changes you notice that you are concerned about.  During the second trimester, you will likely have an increase in your appetite. It is normal to have cravings for certain foods. This varies from person to person and pregnancy to pregnancy.  Your lower abdomen will begin to bulge.  You may have to urinate more often because the uterus and baby are pressing on your bladder. It is also common to get more bladder infections during pregnancy (  pain with urination). You can help this by drinking lots of fluids and emptying your bladder before and after intercourse.  You may begin to get stretch marks on your hips, abdomen, and breasts. These are normal changes in the body during pregnancy. There are no exercises or medications to take that prevent this change.  You may begin to develop swollen and bulging veins (varicose veins) in your legs. Wearing support hose, elevating your feet for 15 minutes, 3 to 4 times a day and limiting salt in your diet helps lessen  the problem.  Heartburn may develop as the uterus grows and pushes up against the stomach. Antacids recommended by your caregiver helps with this problem. Also, eating smaller meals 4 to 5 times a day helps.  Constipation can be treated with a stool softener or adding bulk to your diet. Drinking lots of fluids, vegetables, fruits, and whole grains are helpful.  Exercising is also helpful. If you have been very active up until your pregnancy, most of these activities can be continued during your pregnancy. If you have been less active, it is helpful to start an exercise program such as walking.  Hemorrhoids (varicose veins in the rectum) may develop at the end of the second trimester. Warm sitz baths and hemorrhoid cream recommended by your caregiver helps hemorrhoid problems.  Backaches may develop during this time of your pregnancy. Avoid heavy lifting, wear low heal shoes and practice good posture to help with backache problems.  Some pregnant women develop tingling and numbness of their hand and fingers because of swelling and tightening of ligaments in the wrist (carpel tunnel syndrome). This goes away after the baby is born.  As your breasts enlarge, you may have to get a bigger bra. Get a comfortable, cotton, support bra. Do not get a nursing bra until the last month of the pregnancy if you will be nursing the baby.  You may get a dark line from your belly button to the pubic area called the linea nigra.  You may develop rosy cheeks because of increase blood flow to the face.  You may develop spider looking lines of the face, neck, arms and chest. These go away after the baby is born. HOME CARE INSTRUCTIONS   It is extremely important to avoid all smoking, herbs, alcohol, and unprescribed drugs during your pregnancy. These chemicals affect the formation and growth of the baby. Avoid these chemicals throughout the pregnancy to ensure the delivery of a healthy infant.  Most of your home  care instructions are the same as suggested for the first trimester of your pregnancy. Keep your caregiver's appointments. Follow your caregiver's instructions regarding medication use, exercise and diet.  During pregnancy, you are providing food for you and your baby. Continue to eat regular, well-balanced meals. Choose foods such as meat, fish, milk and other low fat dairy products, vegetables, fruits, and whole-grain breads and cereals. Your caregiver will tell you of the ideal weight gain.  A physical sexual relationship may be continued up until near the end of pregnancy if there are no other problems. Problems could include early (premature) leaking of amniotic fluid from the membranes, vaginal bleeding, abdominal pain, or other medical or pregnancy problems.  Exercise regularly if there are no restrictions. Check with your caregiver if you are unsure of the safety of some of your exercises. The greatest weight gain will occur in the last 2 trimesters of pregnancy. Exercise will help you:  Control your weight.  Get you in  shape for labor and delivery.  Lose weight after you have the baby.  Wear a good support or jogging bra for breast tenderness during pregnancy. This may help if worn during sleep. Pads or tissues may be used in the bra if you are leaking colostrum.  Do not use hot tubs, steam rooms or saunas throughout the pregnancy.  Wear your seat belt at all times when driving. This protects you and your baby if you are in an accident.  Avoid raw meat, uncooked cheese, cat litter boxes and soil used by cats. These carry germs that can cause birth defects in the baby.  The second trimester is also a good time to visit your dentist for your dental health if this has not been done yet. Getting your teeth cleaned is OK. Use a soft toothbrush. Brush gently during pregnancy.  It is easier to loose urine during pregnancy. Tightening up and strengthening the pelvic muscles will help with  this problem. Practice stopping your urination while you are going to the bathroom. These are the same muscles you need to strengthen. It is also the muscles you would use as if you were trying to stop from passing gas. You can practice tightening these muscles up 10 times a set and repeating this about 3 times per day. Once you know what muscles to tighten up, do not perform these exercises during urination. It is more likely to contribute to an infection by backing up the urine.  Ask for help if you have financial, counseling or nutritional needs during pregnancy. Your caregiver will be able to offer counseling for these needs as well as refer you for other special needs.  Your skin may become oily. If so, wash your face with mild soap, use non-greasy moisturizer and oil or cream based makeup. MEDICATIONS AND DRUG USE IN PREGNANCY  Take prenatal vitamins as directed. The vitamin should contain 1 milligram of folic acid. Keep all vitamins out of reach of children. Only a couple vitamins or tablets containing iron may be fatal to a baby or young child when ingested.  Avoid use of all medications, including herbs, over-the-counter medications, not prescribed or suggested by your caregiver. Only take over-the-counter or prescription medicines for pain, discomfort, or fever as directed by your caregiver. Do not use aspirin.  Let your caregiver also know about herbs you may be using.  Alcohol is related to a number of birth defects. This includes fetal alcohol syndrome. All alcohol, in any form, should be avoided completely. Smoking will cause low birth rate and premature babies.  Street or illegal drugs are very harmful to the baby. They are absolutely forbidden. A baby born to an addicted mother will be addicted at birth. The baby will go through the same withdrawal an adult does. SEEK MEDICAL CARE IF:  You have any concerns or worries during your pregnancy. It is better to call with your questions if  you feel they cannot wait, rather than worry about them. SEEK IMMEDIATE MEDICAL CARE IF:   An unexplained oral temperature above 102 F (38.9 C) develops, or as your caregiver suggests.  You have leaking of fluid from the vagina (birth canal). If leaking membranes are suspected, take your temperature and tell your caregiver of this when you call.  There is vaginal spotting, bleeding, or passing clots. Tell your caregiver of the amount and how many pads are used. Light spotting in pregnancy is common, especially following intercourse.  You develop a bad smelling vaginal discharge  with a change in the color from clear to white.  You continue to feel sick to your stomach (nauseated) and have no relief from remedies suggested. You vomit blood or coffee ground-like materials.  You lose more than 2 pounds of weight or gain more than 2 pounds of weight over 1 week, or as suggested by your caregiver.  You notice swelling of your face, hands, feet, or legs.  You get exposed to [place] measles and have never had them.  You are exposed to fifth disease or chickenpox.  You develop belly (abdominal) pain. Round ligament discomfort is a common non-cancerous (benign) cause of abdominal pain in pregnancy. Your caregiver still must evaluate you.  You develop a bad headache that does not go away.  You develop fever, diarrhea, pain with urination, or shortness of breath.  You develop visual problems, blurry, or double vision.  You fall or are in a car accident or any kind of trauma.  There is mental or physical violence at home. Document Released: 11/11/2001 Document Revised: 02/09/2012 Document Reviewed: 05/16/2009 [facility] Patient Information 2013 [place], [place].

## 2013-01-26 NOTE — Assessment & Plan Note (Addendum)
Seen at [facility] by [facility] screen: to be done HIV/RPR: neg/neg Rh neg

## 2013-02-16 ENCOUNTER — Ambulatory Visit (HOSPITAL_COMMUNITY)
Admission: RE | Admit: 2013-02-16 | Discharge: 2013-02-16 | Disposition: A | Discharge status: Home / Self Care | Payer: Medicaid Other | Source: Ambulatory Visit | Attending: Family Medicine | Admitting: Family Medicine

## 2013-02-16 DIAGNOSIS — O34219 Maternal care for unspecified type scar from previous cesarean delivery: Secondary | ICD-10-CM | POA: Insufficient documentation

## 2013-02-16 DIAGNOSIS — Z3689 Encounter for other specified antenatal screening: Secondary | ICD-10-CM | POA: Insufficient documentation

## 2013-02-16 DIAGNOSIS — E669 Obesity, unspecified: Secondary | ICD-10-CM | POA: Insufficient documentation

## 2013-02-16 DIAGNOSIS — Z3490 Encounter for supervision of normal pregnancy, unspecified, unspecified trimester: Secondary | ICD-10-CM

## 2013-02-21 ENCOUNTER — Encounter (HOSPITAL_COMMUNITY): Payer: Self-pay | Admitting: *Deleted

## 2013-02-21 ENCOUNTER — Encounter: Payer: Self-pay | Admitting: Family Medicine

## 2013-02-21 ENCOUNTER — Inpatient Hospital Stay (HOSPITAL_COMMUNITY)
Admission: AD | Admit: 2013-02-21 | Discharge: 2013-02-21 | Disposition: A | Discharge status: Home / Self Care | Payer: Medicaid Other | Source: Ambulatory Visit | Attending: Obstetrics & Gynecology | Admitting: Obstetrics & Gynecology

## 2013-02-21 ENCOUNTER — Ambulatory Visit (INDEPENDENT_AMBULATORY_CARE_PROVIDER_SITE_OTHER): Payer: Medicaid Other | Admitting: Family Medicine

## 2013-02-21 ENCOUNTER — Telehealth: Payer: Self-pay | Admitting: Family Medicine

## 2013-02-21 VITALS — BP 120/80 | Temp 98.7°F | Wt 224.2 lb

## 2013-02-21 DIAGNOSIS — O99891 Other specified diseases and conditions complicating pregnancy: Secondary | ICD-10-CM | POA: Insufficient documentation

## 2013-02-21 DIAGNOSIS — R112 Nausea with vomiting, unspecified: Secondary | ICD-10-CM

## 2013-02-21 DIAGNOSIS — K529 Noninfective gastroenteritis and colitis, unspecified: Secondary | ICD-10-CM

## 2013-02-21 DIAGNOSIS — R51 Headache: Secondary | ICD-10-CM | POA: Insufficient documentation

## 2013-02-21 DIAGNOSIS — Z3482 Encounter for supervision of other normal pregnancy, second trimester: Secondary | ICD-10-CM

## 2013-02-21 DIAGNOSIS — R197 Diarrhea, unspecified: Secondary | ICD-10-CM | POA: Insufficient documentation

## 2013-02-21 DIAGNOSIS — K5289 Other specified noninfective gastroenteritis and colitis: Secondary | ICD-10-CM | POA: Insufficient documentation

## 2013-02-21 DIAGNOSIS — Z3480 Encounter for supervision of other normal pregnancy, unspecified trimester: Secondary | ICD-10-CM

## 2013-02-21 DIAGNOSIS — O210 Mild hyperemesis gravidarum: Secondary | ICD-10-CM | POA: Insufficient documentation

## 2013-02-21 LAB — POCT URINALYSIS DIPSTICK
Nitrite, UA: NEGATIVE
Urobilinogen, UA: 2
pH, UA: 6.5

## 2013-02-21 LAB — POCT UA - MICROSCOPIC ONLY

## 2013-02-21 MED ORDER — ONDANSETRON 8 MG PO TBDP: 8.0000 mg | ORAL_TABLET | Freq: Three times a day (TID) | ORAL | Status: DC | PRN

## 2013-02-21 MED ORDER — ONDANSETRON 4 MG PO TBDP
4.0000 mg | ORAL_TABLET | Freq: Once | ORAL | Status: AC
  Administered 2013-02-21: 4 mg via ORAL

## 2013-02-21 MED ORDER — SODIUM CHLORIDE 0.9 % IV SOLN
25.0000 mg | Freq: Once | INTRAVENOUS | Status: AC
  Administered 2013-02-21: 25 mg via INTRAVENOUS
  Filled 2013-02-21: qty 1

## 2013-02-21 NOTE — MAU Provider Note (Signed)
Cannot edit other note.  Patient has completed IV hydration. Able to tolerate G[place] and is asking for a meal.  Will discharge home. Has Rx for Zofran sent by [facility]  Follow up with them

## 2013-02-21 NOTE — Patient Instructions (Addendum)
It was nice to meet you today; I'm sorry you don't feel well.  I think that you have a stomach bug.  But you are dehydrated based on your urine sample.   I think that the best thing to do would be to go to the MAU for some IV fluids to help make you feel better.  But, if you decide to go home: I am sending in a prescription for some nausea medicine-- use this as needed.  We gave you this medicine while you were in clinic. Make sure you take small, frequent sips of clear fluids to try to stay hydrated.  Go to the MAU if you continue to vomit despite the medicine at home, do not feel baby moving, start having contractions, start having vaginal bleeding, or continue to feel like you may pass out.  Keep your appointment with Dr. Gwenlyn Saran on 02/24/13.

## 2013-02-21 NOTE — MAU Provider Note (Signed)
History     CSN: [id number]  Arrival date and time: 02/21/13 1517   None     Chief Complaint  Patient presents with  . Emesis  . Diarrhea  . Headache  . Generalized Body Aches   Emesis  This is a new problem. The current episode started yesterday. The problem occurs 5 to 10 times per day. The problem has been gradually improving. The emesis has an appearance of stomach contents. There has been no fever. Associated symptoms include diarrhea, headaches and myalgias. Pertinent negatives include no abdominal pain, chest pain, chills, coughing or fever. Risk factors include ill contacts.  Diarrhea  Associated symptoms include headaches, myalgias and vomiting. Pertinent negatives include no abdominal pain, chills, coughing or fever.  Headache  Associated symptoms include nausea and vomiting. Pertinent negatives include no abdominal pain, blurred vision, coughing or fever.    Pt is a 27 y/o G4P3 [redacted]w[redacted]d female who presents today for vomiting/diarrhea. She reports that beginning 24 hours ago she has had 6-7 episodes of vomiting and 2-3 episodes of diarrhea. These symptoms have been going around in her family and have resolved in 24 hours for her family members. She feels that her symptoms are worse than her other family members. She has not noticed any blood in her vomit or stool. She has not measured her temperature or felt feverish. She has not eaten any questionable food that she is worried about. She says that she gets lightheaded and dizzy when stands or sits up. She has a headache that began when she started vomiting, located over entire head. She has not eaten or had anything to drink since yesterday morning. She was seen at [facility], who sent her to the MAU. She received Zofran there and her UA there showed dehydration, with specific gravity >1.030 and ketones = 80. She says her nausea has improved since receiving the Zofran and is asking if she can have anything to eat.   Allergies:  No Known Allergies  Pre-admission events: Pt was seen at [facility] clinic and referred in for dehyration as evidence by urinalysis.  Pt as given zofran po and sent for mIVF and further evaluation.    Prescriptions prior to admission  Medication Sig Dispense Refill  . ondansetron (ZOFRAN ODT) 8 MG disintegrating tablet Take 1 tablet (8 mg total) by mouth every 8 (eight) hours as needed for nausea.  20 tablet  0  . [DISCONTINUED] nitrofurantoin, macrocrystal-monohydrate, (MACROBID) 100 MG capsule Take 1 capsule (100 mg total) by mouth 2 (two) times daily.  14 capsule  0  . [DISCONTINUED] OVER THE COUNTER MEDICATION Place 1 drop into both eyes daily as needed. Pt uses an OTC eye drop for allergies as needed; does not know name of medication      . [DISCONTINUED] polyethylene glycol powder (GLYCOLAX/MIRALAX) powder Take 17 g by mouth daily.  3350 g  1  . [DISCONTINUED] vitamin B-6 (PYRIDOXINE) 25 MG tablet Take 1 tablet (25 mg total) by mouth 3 (three) times daily.  90 tablet  0    Review of Systems  Constitutional: Positive for malaise/fatigue. Negative for fever and chills.  Eyes: Negative for blurred vision and double vision.  Respiratory: Negative for cough, shortness of breath and wheezing.   Cardiovascular: Negative for chest pain, palpitations and leg swelling.  Gastrointestinal: Positive for nausea, vomiting and diarrhea. Negative for abdominal pain and constipation.  Genitourinary: Negative for dysuria, urgency and frequency.  Musculoskeletal: Positive for myalgias. Negative for joint pain.  Skin: Negative for rash.  Neurological: Positive for headaches.  Endo/Heme/Allergies: Negative for environmental allergies. Does not bruise/bleed easily.  All other systems reviewed and are negative.   Physical Exam   Blood pressure 121/84, pulse 96, temperature 99.6 F (37.6 C), temperature source Oral, resp. rate 18, height 5' (1.524 m), weight 100.789 kg (222 lb 3.2 oz), last menstrual  period 10/04/2012, SpO2 99.00%.  Physical Exam  Vitals reviewed. Constitutional: She is oriented to person, place, and time. She appears well-developed and well-nourished. No distress.  Lying under sheet, says she feels cold.   HENT:  Mucous membranes moist  Eyes: Pupils are equal, round, and reactive to light.  Neck: Normal range of motion. Neck supple.  Cardiovascular: Normal rate, regular rhythm, normal heart sounds and intact distal pulses.  Exam reveals no gallop and no friction rub.   No murmur heard. Respiratory: Effort normal and breath sounds normal. No respiratory distress. She has no wheezes. She has no rales.  GI: Soft. Bowel sounds are normal. She exhibits no distension. There is tenderness (RLQ). There is rebound (RLQ).  Musculoskeletal: Normal range of motion. She exhibits no edema and no tenderness.  Neurological: She is alert and oriented to person, place, and time. No cranial nerve deficit.  Skin: Skin is warm and dry.  No tenting  Psychiatric: She has a normal mood and affect. Her behavior is normal.   Results for orders placed in visit on 02/21/13 (from the past 24 hour(s))  POCT URINALYSIS DIPSTICK     Status: Abnormal   Collection Time    02/21/13  1:57 PM      Result Value Range   Color, UA ORANGE     Clarity, UA CLOUDY     Glucose, UA NEG     Bilirubin, UA SMALL     Ketones, UA 80     Spec Grav, UA >=1.030     Blood, UA NEG     pH, UA 6.5     Protein, UA 30     Urobilinogen, UA 2.0     Nitrite, UA NEG     Leukocytes, UA Trace     Narrative:    Reflex to microscopic   POCT UA - MICROSCOPIC ONLY     Status: None   Collection Time    02/21/13  1:57 PM      Result Value Range   WBC, Ur, HPF, POC 10-20     RBC, urine, microscopic 0-3     Bacteria, U Microscopic 3+     Epithelial cells, urine per micros >20      MAU Course  Procedures         Assessment and Plan  G4P3 [redacted]w[redacted]d female presenting with nausea/vomiting/diarrhea for 24 hours  #  Gastroenteritis: based on history multiple immediate sick contacts, rapid onset of symptoms, no blood in the stool, afebrile, and last abx [place] ending in December.   Differential: Appendicitis: pt has n/v w/ RLQ pain however denies loss of appetite, fever or pain that has localized to RLQ.  C. Difficile: pt denies abdominal pain, no recent hospital exposure and no recent abx use.  Influenzae: pt mentions multiple sick contacts, denies seasonal vaccination with illness associated with body aches.  Pt is afebrile, with diarrhea.   - Vitals normal - UA shows dehydration - 1L bolus of  lactated ringers - Rx for zofran written at [facility] - po challenge  -- d/c to home  Lorna Dibble 02/21/2013, 4:31 PM  Seen also by me Agree with note Wynelle Bourgeois CNM

## 2013-02-21 NOTE — Assessment & Plan Note (Signed)
Likely 2/2 GI virus.  Dehydrated and symptomatic, so will send to MAU for IVF.  Given Zofran in the office and Rx sent to pharmacy.  Discussed with [facility] resident, aware that pt is coming to MAU.

## 2013-02-21 NOTE — MAU Note (Signed)
Patient states she was seen today at [facility] and sent to MAU for evaluation. States she has had nausea, vomiting and diarrhea since 3-23 with general body aches. Denies bleeding or discharge.

## 2013-02-21 NOTE — Telephone Encounter (Signed)
Has been having nausea and diarrhea since yesterday.  Last drank orange juice this morning and vomited.  Informed to avoid sugary foods & drinks and to stick with BRAT diet.  Patient states she is "scared to eat anything" and is feeling weak.  Discussed possible dehydration with patient.  Offered patient an appt to come in for an appt today.  Patient does not have transportation and will see if someone can bring her in today.  Patient will call our office back in few minutes.  Patient called back and scheduled appt today at 1:45 pm with Dr. Fara Boros.  Gaylene Brooks, RN

## 2013-02-21 NOTE — Progress Notes (Signed)
S: 27 y.o. [id number] @ [redacted]w[redacted]d here for SDA for nausea, vomiting, diarrhea.  She reports that this started yesterday morning.  Her children and husband had similar symptoms, but they resolved in 24 hours; she has had hers for slightly over 24 hours and feels like hers is worse.  Unable to keep down anything- fluids included.  Last tried to drink ~10/1129 and vomited after- tried to drink water.  Has had 6-7 episodes of vomiting and 2-3 episodes of diarrhea.  Feels a little light headed.  +FM but decreased. No vaginal bleeding, no LOF, no contractions but + upper abdominal pain O:  Gen: alert, NAD, not activtly vomiting Abd: soft, +BS, nontender FHT 159-162 A/P: 27 yo G4P3003 @ 20.0 with acute gastroenteritis -UA showing dehydration (elevated spec grav, 80 ketones); was not clean catch- may want to repeat in MAU to r/o UTI  -Will give Rx for zofran, also given dose in the clinic -Has f/u for regular OB appt in 3 days, will keep that f/u -Pt to go to MAU at this time for IVF and evaluation.

## 2013-02-21 NOTE — Telephone Encounter (Signed)
Pt is pregnant and can't stop throwing up - hasn't eaten since 10am yesterday.  Wants to know what she can do.  Her next appt is 3/26

## 2013-02-23 NOTE — MAU Provider Note (Signed)
Attestation of Attending Supervision of Advanced Practitioner (CNM/NP): Evaluation and management procedures were performed by the Advanced Practitioner under my supervision and collaboration. I have reviewed the Advanced Practitioner's note and chart, and I agree with the management and plan.  Merinda Victorino H. 11:51 AM   

## 2013-02-24 ENCOUNTER — Ambulatory Visit (INDEPENDENT_AMBULATORY_CARE_PROVIDER_SITE_OTHER): Payer: Medicaid Other | Admitting: Family Medicine

## 2013-02-24 VITALS — BP 110/73 | Wt 228.0 lb

## 2013-02-24 DIAGNOSIS — Z3480 Encounter for supervision of other normal pregnancy, unspecified trimester: Secondary | ICD-10-CM

## 2013-02-24 NOTE — MAU Provider Note (Signed)
Attestation of Attending Supervision of Advanced Practitioner (CNM/NP): Evaluation and management procedures were performed by the Advanced Practitioner under my supervision and collaboration. I have reviewed the Advanced Practitioner's note and chart, and I agree with the management and plan.  Avid Guillette H. 3:36 PM   

## 2013-02-24 NOTE — Patient Instructions (Addendum)
For the chest discomfort, if it's any worst at rest, please come back to the clinic for further evaluation.   Pregnancy - Second Trimester The second trimester of pregnancy (3 to 6 months) is a period of rapid growth for you and your baby. At the end of the sixth month, your baby is about 9 inches long and weighs 1 1/2 pounds. You will begin to feel the baby move between 18 and 20 weeks of the pregnancy. This is called quickening. Weight gain is faster. A clear fluid (colostrum) may leak out of your breasts. You may feel small contractions of the womb (uterus). This is known as false labor or Braxton-Hicks contractions. This is like a practice for labor when the baby is ready to be born. Usually, the problems with morning sickness have usually passed by the end of your first trimester. Some women develop small dark blotches (called cholasma, mask of pregnancy) on their face that usually goes away after the baby is born. Exposure to the sun makes the blotches worse. Acne may also develop in some pregnant women and pregnant women who have acne, may find that it goes away. PRENATAL EXAMS  Blood work may continue to be done during prenatal exams. These tests are done to check on your health and the probable health of your baby. Blood work is used to follow your blood levels (hemoglobin). Anemia (low hemoglobin) is common during pregnancy. Iron and vitamins are given to help prevent this. You will also be checked for diabetes between 24 and 28 weeks of the pregnancy. Some of the previous blood tests may be repeated.  The size of the uterus is measured during each visit. This is to make sure that the baby is continuing to grow properly according to the dates of the pregnancy.  Your blood pressure is checked every prenatal visit. This is to make sure you are not getting toxemia.  Your urine is checked to make sure you do not have an infection, diabetes or protein in the urine.  Your weight is checked often to  make sure gains are happening at the suggested rate. This is to ensure that both you and your baby are growing normally.  Sometimes, an ultrasound is performed to confirm the proper growth and development of the baby. This is a test which bounces harmless sound waves off the baby so your caregiver can more accurately determine due dates. Sometimes, a specialized test is done on the amniotic fluid surrounding the baby. This test is called an amniocentesis. The amniotic fluid is obtained by sticking a needle into the belly (abdomen). This is done to check the chromosomes in instances where there is a concern about possible genetic problems with the baby. It is also sometimes done near the end of pregnancy if an early delivery is required. In this case, it is done to help make sure the baby's lungs are mature enough for the baby to live outside of the womb. CHANGES OCCURING IN THE SECOND TRIMESTER OF PREGNANCY Your body goes through many changes during pregnancy. They vary from person to person. Talk to your caregiver about changes you notice that you are concerned about.  During the second trimester, you will likely have an increase in your appetite. It is normal to have cravings for certain foods. This varies from person to person and pregnancy to pregnancy.  Your lower abdomen will begin to bulge.  You may have to urinate more often because the uterus and baby are pressing on  your bladder. It is also common to get more bladder infections during pregnancy (pain with urination). You can help this by drinking lots of fluids and emptying your bladder before and after intercourse.  You may begin to get stretch marks on your hips, abdomen, and breasts. These are normal changes in the body during pregnancy. There are no exercises or medications to take that prevent this change.  You may begin to develop swollen and bulging veins (varicose veins) in your legs. Wearing support hose, elevating your feet for 15  minutes, 3 to 4 times a day and limiting salt in your diet helps lessen the problem.  Heartburn may develop as the uterus grows and pushes up against the stomach. Antacids recommended by your caregiver helps with this problem. Also, eating smaller meals 4 to 5 times a day helps.  Constipation can be treated with a stool softener or adding bulk to your diet. Drinking lots of fluids, vegetables, fruits, and whole grains are helpful.  Exercising is also helpful. If you have been very active up until your pregnancy, most of these activities can be continued during your pregnancy. If you have been less active, it is helpful to start an exercise program such as walking.  Hemorrhoids (varicose veins in the rectum) may develop at the end of the second trimester. Warm sitz baths and hemorrhoid cream recommended by your caregiver helps hemorrhoid problems.  Backaches may develop during this time of your pregnancy. Avoid heavy lifting, wear low heal shoes and practice good posture to help with backache problems.  Some pregnant women develop tingling and numbness of their hand and fingers because of swelling and tightening of ligaments in the wrist (carpel tunnel syndrome). This goes away after the baby is born.  As your breasts enlarge, you may have to get a bigger bra. Get a comfortable, cotton, support bra. Do not get a nursing bra until the last month of the pregnancy if you will be nursing the baby.  You may get a dark line from your belly button to the pubic area called the linea nigra.  You may develop rosy cheeks because of increase blood flow to the face.  You may develop spider looking lines of the face, neck, arms and chest. These go away after the baby is born. HOME CARE INSTRUCTIONS   It is extremely important to avoid all smoking, herbs, alcohol, and unprescribed drugs during your pregnancy. These chemicals affect the formation and growth of the baby. Avoid these chemicals throughout the  pregnancy to ensure the delivery of a healthy infant.  Most of your home care instructions are the same as suggested for the first trimester of your pregnancy. Keep your caregiver's appointments. Follow your caregiver's instructions regarding medication use, exercise and diet.  During pregnancy, you are providing food for you and your baby. Continue to eat regular, well-balanced meals. Choose foods such as meat, fish, milk and other low fat dairy products, vegetables, fruits, and whole-grain breads and cereals. Your caregiver will tell you of the ideal weight gain.  A physical sexual relationship may be continued up until near the end of pregnancy if there are no other problems. Problems could include early (premature) leaking of amniotic fluid from the membranes, vaginal bleeding, abdominal pain, or other medical or pregnancy problems.  Exercise regularly if there are no restrictions. Check with your caregiver if you are unsure of the safety of some of your exercises. The greatest weight gain will occur in the last 2 trimesters of  pregnancy. Exercise will help you:  Control your weight.  Get you in shape for labor and delivery.  Lose weight after you have the baby.  Wear a good support or jogging bra for breast tenderness during pregnancy. This may help if worn during sleep. Pads or tissues may be used in the bra if you are leaking colostrum.  Do not use hot tubs, steam rooms or saunas throughout the pregnancy.  Wear your seat belt at all times when driving. This protects you and your baby if you are in an accident.  Avoid raw meat, uncooked cheese, cat litter boxes and soil used by cats. These carry germs that can cause birth defects in the baby.  The second trimester is also a good time to visit your dentist for your dental health if this has not been done yet. Getting your teeth cleaned is OK. Use a soft toothbrush. Brush gently during pregnancy.  It is easier to loose urine during  pregnancy. Tightening up and strengthening the pelvic muscles will help with this problem. Practice stopping your urination while you are going to the bathroom. These are the same muscles you need to strengthen. It is also the muscles you would use as if you were trying to stop from passing gas. You can practice tightening these muscles up 10 times a set and repeating this about 3 times per day. Once you know what muscles to tighten up, do not perform these exercises during urination. It is more likely to contribute to an infection by backing up the urine.  Ask for help if you have financial, counseling or nutritional needs during pregnancy. Your caregiver will be able to offer counseling for these needs as well as refer you for other special needs.  Your skin may become oily. If so, wash your face with mild soap, use non-greasy moisturizer and oil or cream based makeup. MEDICATIONS AND DRUG USE IN PREGNANCY  Take prenatal vitamins as directed. The vitamin should contain 1 milligram of folic acid. Keep all vitamins out of reach of children. Only a couple vitamins or tablets containing iron may be fatal to a baby or young child when ingested.  Avoid use of all medications, including herbs, over-the-counter medications, not prescribed or suggested by your caregiver. Only take over-the-counter or prescription medicines for pain, discomfort, or fever as directed by your caregiver. Do not use aspirin.  Let your caregiver also know about herbs you may be using.  Alcohol is related to a number of birth defects. This includes fetal alcohol syndrome. All alcohol, in any form, should be avoided completely. Smoking will cause low birth rate and premature babies.  Street or illegal drugs are very harmful to the baby. They are absolutely forbidden. A baby born to an addicted mother will be addicted at birth. The baby will go through the same withdrawal an adult does. SEEK MEDICAL CARE IF:  You have any concerns  or worries during your pregnancy. It is better to call with your questions if you feel they cannot wait, rather than worry about them. SEEK IMMEDIATE MEDICAL CARE IF:   An unexplained oral temperature above 102 F (38.9 C) develops, or as your caregiver suggests.  You have leaking of fluid from the vagina (birth canal). If leaking membranes are suspected, take your temperature and tell your caregiver of this when you call.  There is vaginal spotting, bleeding, or passing clots. Tell your caregiver of the amount and how many pads are used. Light spotting in pregnancy  is common, especially following intercourse.  You develop a bad smelling vaginal discharge with a change in the color from clear to white.  You continue to feel sick to your stomach (nauseated) and have no relief from remedies suggested. You vomit blood or coffee ground-like materials.  You lose more than 2 pounds of weight or gain more than 2 pounds of weight over 1 week, or as suggested by your caregiver.  You notice swelling of your face, hands, feet, or legs.  You get exposed to [place] measles and have never had them.  You are exposed to fifth disease or chickenpox.  You develop belly (abdominal) pain. Round ligament discomfort is a common non-cancerous (benign) cause of abdominal pain in pregnancy. Your caregiver still must evaluate you.  You develop a bad headache that does not go away.  You develop fever, diarrhea, pain with urination, or shortness of breath.  You develop visual problems, blurry, or double vision.  You fall or are in a car accident or any kind of trauma.  There is mental or physical violence at home. Document Released: 11/11/2001 Document Revised: 02/09/2012 Document Reviewed: 05/16/2009 [facility] Patient Information 2013 [place], [place].

## 2013-02-26 ENCOUNTER — Encounter: Payer: Self-pay | Admitting: Family Medicine

## 2013-02-26 NOTE — Progress Notes (Signed)
S: patient reports some chest discomfort with laughing and walking uphill, associated with some dyspnea. Pain is located in middle of chest. Not worst with deep breath. No cough. No lower extremity swelling. Has been going on for 2-3 weeks. Not worst than when it started.  Otherwise, patient was seen at clinic and [facility] 3 days ago for dehydration from diarrhea and vomiting. Symptoms have since then resolved.  Denies contractions, vaginal bleeding, vaginal discharge, loss of fluid. Feeling fetal movement O: CV: S1S2, RRR, no murmur, reproducible tenderness to palpation along chest wall Pulm: CTA b/l, normal effort, 100% O2 on RA Extr: no pedal edema, negative Homan's A/P: 27 yo G4P3003 at 20.3wga who presents for routine prenatal visit - chest discomfort: appears musculoskeletal in nature. Patient not tachycardic or hypoxic, making PE very unlikely. No evidence of pneumonia. Unlikely to be cardiac. Patient instructed to seek medical care if pain occurs at rest, occurs with significant dyspnea or significant swelling in one or both legs.  - discussed weight gain: patient instructed to not drink anything with sugar in it. She states that she drinks juice and non diet soda during the day.  - 19wga anatomy US reviewed: normal, consistent with LMP  - follow up in 4 weeks.

## 2013-03-14 NOTE — Addendum Note (Signed)
Addended by: Demetria Pore A on: 03/14/2013 11:28 AM   Modules accepted: Level of Service

## 2013-03-15 NOTE — Addendum Note (Signed)
Addended by: Demetria Pore A on: 03/15/2013 04:09 PM   Modules accepted: Level of Service

## 2013-03-25 ENCOUNTER — Ambulatory Visit (INDEPENDENT_AMBULATORY_CARE_PROVIDER_SITE_OTHER): Payer: Medicaid Other | Admitting: Family Medicine

## 2013-03-25 VITALS — BP 129/82 | Wt 226.0 lb

## 2013-03-25 DIAGNOSIS — Z3480 Encounter for supervision of other normal pregnancy, unspecified trimester: Secondary | ICD-10-CM

## 2013-03-25 DIAGNOSIS — Z3482 Encounter for supervision of other normal pregnancy, second trimester: Secondary | ICD-10-CM

## 2013-03-25 NOTE — Progress Notes (Signed)
S: patient reports 2 episodes this morning of tightening of her abdomen, a few hours apart. Not long lasting. No leaking of fluid. Feeling fetal movement. No vaginal bleeding.  O: see flowsheet A/P: 27 yo G4P3003 at 26.4 wga by LMP who presents for routine prenatal visit.  - reviewed preterm labor caution - reviewed kick counts - likely feeling braxton hicks.  - measuring greater than gestational age: would like to obtain an ultrasound to measure fetal growth and amniotic fluid level.  - follow up in 3-4 weeks with OB clinic. Will need CBC/RPR/HIV and glucola.  - Rh negative: will need rhogam at 28wga appointment.

## 2013-03-25 NOTE — Patient Instructions (Addendum)
Please follow up with the OB clinic here at family practice in 4 weeks.   Fetal Movement Counts Patient Name: __________________________________________________ Patient Due Date: ____________________ Melody Haver counts is highly recommended in high risk pregnancies, but it is a good idea for every pregnant woman to do. Start counting fetal movements at 28 weeks of the pregnancy. Fetal movements increase after eating a full meal or eating or drinking something sweet (the blood sugar is higher). It is also important to drink plenty of fluids (well hydrated) before doing the count. Lie on your left side because it helps with the circulation or you can sit in a comfortable chair with your arms over your belly (abdomen) with no distractions around you. DOING THE COUNT  Try to do the count the same time of day each time you do it.  Mark the day and time, then see how long it takes for you to feel 10 movements (kicks, flutters, swishes, rolls). You should have at least 10 movements within 2 hours. You will most likely feel 10 movements in much less than 2 hours. If you do not, wait an hour and count again. After a couple of days you will see a pattern.  What you are looking for is a change in the pattern or not enough counts in 2 hours. Is it taking longer in time to reach 10 movements? SEEK MEDICAL CARE IF:  You feel less than 10 counts in 2 hours. Tried twice.  No movement in one hour.  The pattern is changing or taking longer each day to reach 10 counts in 2 hours.  You feel the baby is not moving as it usually does. Date: ____________ Movements: ____________ Start time: ____________ Doreatha Martin time: ____________  Date: ____________ Movements: ____________ Start time: ____________ Doreatha Martin time: ____________ Date: ____________ Movements: ____________ Start time: ____________ Doreatha Martin time: ____________ Date: ____________ Movements: ____________ Start time: ____________ Doreatha Martin time: ____________ Date:  ____________ Movements: ____________ Start time: ____________ Doreatha Martin time: ____________ Date: ____________ Movements: ____________ Start time: ____________ Doreatha Martin time: ____________ Date: ____________ Movements: ____________ Start time: ____________ Doreatha Martin time: ____________ Date: ____________ Movements: ____________ Start time: ____________ Doreatha Martin time: ____________  Date: ____________ Movements: ____________ Start time: ____________ Doreatha Martin time: ____________ Date: ____________ Movements: ____________ Start time: ____________ Doreatha Martin time: ____________ Date: ____________ Movements: ____________ Start time: ____________ Doreatha Martin time: ____________ Date: ____________ Movements: ____________ Start time: ____________ Doreatha Martin time: ____________ Date: ____________ Movements: ____________ Start time: ____________ Doreatha Martin time: ____________ Date: ____________ Movements: ____________ Start time: ____________ Doreatha Martin time: ____________ Date: ____________ Movements: ____________ Start time: ____________ Doreatha Martin time: ____________  Date: ____________ Movements: ____________ Start time: ____________ Doreatha Martin time: ____________ Date: ____________ Movements: ____________ Start time: ____________ Doreatha Martin time: ____________ Date: ____________ Movements: ____________ Start time: ____________ Doreatha Martin time: ____________ Date: ____________ Movements: ____________ Start time: ____________ Doreatha Martin time: ____________ Date: ____________ Movements: ____________ Start time: ____________ Doreatha Martin time: ___________

## 2013-03-29 ENCOUNTER — Ambulatory Visit (HOSPITAL_COMMUNITY)
Admission: RE | Admit: 2013-03-29 | Discharge: 2013-03-29 | Disposition: A | Discharge status: Home / Self Care | Payer: Medicaid Other | Source: Ambulatory Visit | Attending: Family Medicine | Admitting: Family Medicine

## 2013-03-29 ENCOUNTER — Telehealth: Payer: Self-pay | Admitting: Family Medicine

## 2013-03-29 ENCOUNTER — Other Ambulatory Visit: Payer: Self-pay | Admitting: Family Medicine

## 2013-03-29 DIAGNOSIS — O3660X0 Maternal care for excessive fetal growth, unspecified trimester, not applicable or unspecified: Secondary | ICD-10-CM | POA: Insufficient documentation

## 2013-03-29 DIAGNOSIS — Z3482 Encounter for supervision of other normal pregnancy, second trimester: Secondary | ICD-10-CM

## 2013-03-29 DIAGNOSIS — O99210 Obesity complicating pregnancy, unspecified trimester: Secondary | ICD-10-CM | POA: Insufficient documentation

## 2013-03-29 DIAGNOSIS — O34219 Maternal care for unspecified type scar from previous cesarean delivery: Secondary | ICD-10-CM | POA: Insufficient documentation

## 2013-03-29 DIAGNOSIS — E669 Obesity, unspecified: Secondary | ICD-10-CM | POA: Insufficient documentation

## 2013-03-29 NOTE — Telephone Encounter (Signed)
Called patient and left message letting her know that [place] results were fine.   Marena Chancy, PGY-2 Family Medicine Resident

## 2013-03-30 ENCOUNTER — Ambulatory Visit (HOSPITAL_COMMUNITY): Payer: Medicaid Other

## 2013-04-21 ENCOUNTER — Other Ambulatory Visit (HOSPITAL_COMMUNITY)
Admission: RE | Admit: 2013-04-21 | Discharge: 2013-04-21 | Disposition: A | Discharge status: Home / Self Care | Payer: Medicaid Other | Source: Ambulatory Visit | Attending: Family Medicine | Admitting: Family Medicine

## 2013-04-21 ENCOUNTER — Ambulatory Visit (INDEPENDENT_AMBULATORY_CARE_PROVIDER_SITE_OTHER): Payer: Medicaid Other | Admitting: Family Medicine

## 2013-04-21 VITALS — BP 103/70 | Temp 98.6°F | Wt 229.0 lb

## 2013-04-21 DIAGNOSIS — O360990 Maternal care for other rhesus isoimmunization, unspecified trimester, not applicable or unspecified: Secondary | ICD-10-CM

## 2013-04-21 DIAGNOSIS — Z113 Encounter for screening for infections with a predominantly sexual mode of transmission: Secondary | ICD-10-CM | POA: Insufficient documentation

## 2013-04-21 DIAGNOSIS — Z3480 Encounter for supervision of other normal pregnancy, unspecified trimester: Secondary | ICD-10-CM

## 2013-04-21 DIAGNOSIS — N898 Other specified noninflammatory disorders of vagina: Secondary | ICD-10-CM

## 2013-04-21 DIAGNOSIS — Z3482 Encounter for supervision of other normal pregnancy, second trimester: Secondary | ICD-10-CM

## 2013-04-21 DIAGNOSIS — O360121 Maternal care for anti-D [Rh] antibodies, second trimester, fetus 1: Secondary | ICD-10-CM

## 2013-04-21 DIAGNOSIS — B373 Candidiasis of vulva and vagina: Secondary | ICD-10-CM

## 2013-04-21 DIAGNOSIS — Z23 Encounter for immunization: Secondary | ICD-10-CM

## 2013-04-21 LAB — CBC
HCT: 33.7 % — ABNORMAL LOW (ref 36.0–46.0)
MCH: 31.7 pg (ref 26.0–34.0)
MCV: 94.7 fL (ref 78.0–100.0)
Platelets: 368 10*3/uL (ref 150–400)
RBC: 3.56 MIL/uL — ABNORMAL LOW (ref 3.87–5.11)
RDW: 13.3 % (ref 11.5–15.5)

## 2013-04-21 LAB — POCT WET PREP (WET MOUNT): Clue Cells Wet Prep Whiff POC: NEGATIVE

## 2013-04-21 MED ORDER — RHO D IMMUNE GLOBULIN 1500 UNIT/2ML IJ SOLN: 300.0000 ug | Freq: Once | INTRAMUSCULAR | Status: DC

## 2013-04-21 MED ORDER — FOLIC ACID 800 MCG PO TABS: 400.0000 ug | ORAL_TABLET | Freq: Every day | ORAL | Status: DC

## 2013-04-21 MED ORDER — FLUCONAZOLE 150 MG PO TABS: 150.0000 mg | ORAL_TABLET | Freq: Once | ORAL | Status: DC

## 2013-04-21 NOTE — Assessment & Plan Note (Signed)
See Vitals and Notes for details. JB

## 2013-04-21 NOTE — Patient Instructions (Addendum)
It was a pleasure to see you in OB Clinic at the [facility] today!  You are 28 weeks and 3 days along in your pregnancy today.    Your ultrasound done on April 29th showed appropriate growth of your baby since the previous ultrasound.  Today you received the RhoGam shot, the tetanus vaccine ("Tdap") and had your 1-hour glucose tolerance test and routine 28-week labs done.   We also collected a specimen for culture to find the cause of your vaginal discharge.  I am sending a prescription for an antifungal medicine, Diflucan 150mg , to take by mouth ONE TIME ONLY.  For your low back pain, you may try a heating pad localized to the area; gently massaging the area of pain; using a pregnancy girdle or belt to help support your belly; tylenol as needed for back pain and headache.  I am glad you are planning to breastfeed your baby.   If you are not able to take the prenatal vitamins, then I recommend folic acid 400 micrograms/day.  I am sending in a prescription to your [facility] pharmacy.  Take 1/2 tablet one time daily.   Your next prenatal visit will be in 2 weeks with Dr Gwenlyn Saran.  Preventing Preterm Labor Preterm labor is when a pregnant woman has contractions that cause the cervix to open, shorten, and thin before 37 weeks of pregnancy. You will have regular contractions (tightening) 2 to 3 minutes apart. This usually causes discomfort or pain. HOME CARE  Eat a healthy diet.  Take your vitamins as told by your doctor.  Drink enough fluids to keep your pee (urine) clear or pale yellow every day.  Get rest and sleep.  Do not have sex if you are at high risk for preterm labor.  Follow your doctor's advice about activity, medicines, and tests.  Avoid stress.  Avoid hard labor or exercise that lasts for a long time.  Do not smoke. GET HELP RIGHT AWAY IF:   You are having contractions.  You have belly (abdominal) pain.  You have bleeding from your vagina.  You have pain  when you pee (urinate).  You have abnormal discharge from your vagina.  You have a temperature by mouth above 102 F (38.9 C). MAKE SURE YOU:  Understand these instructions.  Will watch your condition.  Will get help if you are not doing well or get worse. Document Released: 02/13/2009 Document Revised: 02/09/2012 Document Reviewed: 02/13/2009 [facility] Patient Information 2014 [place], [place].

## 2013-04-21 NOTE — Progress Notes (Signed)
Patient seen in [facility] at [facility], here at 28w 3d.  Recent repeat US for size>dates ([place] date 4/29) shows appropriate interval growth.  She reports good fetal movement, occasional intermittent contractions that are short-lived and resolve on their own.  Some complaints of bilateral low back pain that does not radiate, is not positional.  Reports yellow "cottage cheese" vaginal discharge, no bleeding, d/c onset in the past 3 weeks.  Patient plans to breast- and bottle feed.  She has breast fed her other children, one until almost 16 years of age.  FOB (same as for her 2 and 3 yr olds) is involved, has been staying with her and is supportive.   She is reported as having a history of depression.  Feels that she is not depressed at this time, just "tired" from pregnancy.  PHQ9 score is 12, with 3 pts for each of Q#1,3,4,5 (anhedonia, sleep, feeling tired, appetite).  No points for Q#9.  Prior LTCS with first pregnancy, has had 3 subsequent successful VBACs, and intends to VBAC again.  For OB counseling appt for TOLAC @30  weeks at [facility].  PMH Risk Assessment Tool administered today, no concerns.  Blood type A NEGATIVE, had Ab screen drawn and RhoGam given today.  1-hour glucola done today, 28 week labs (CBC, HIV, RPR) today as well as Tdap. Wet prep and cervical cultures collected today for c/o vaginal discharge.  Clinical appearance of candida, treatment with Diflucan 150mg  po x1. Not tolerating PNVs; prescription for folate daily sent to her pharmacy. Fundal height measurements larger than dates, with reassuring [place] for interval growth on 4/29.  Accurate fundal height measurement is challenging given body habitus/obesity. To continue to monitor, consider repeat US if  Follow up with Dr Gwenlyn Saran in 2 weeks. Paula Compton, MD

## 2013-05-02 ENCOUNTER — Telehealth: Payer: Self-pay | Admitting: Family Medicine

## 2013-05-02 NOTE — Telephone Encounter (Signed)
Spoke with patient who has a monthly membership at a massage place.  She states she "will not have the money this month to pay for the membership and the only way she can cancel her membership is with a note from her doctor."  I asked the patient what her medical reason is for [place] to write her note and she states, "there is no reason why I cant have a massage, I  just don't have the money for it".  I told patient she would have to discuss with her doctor.  Pt has been receiving massages regularly with no problems and "just wont have the money this month and wants a note from her(my) doctor."  Pt very agitated that I told her that we could not do that without any medical reason.  Again, told pt to discuss with her MD on her appt 05/09/2013.  Pt hung up the phone on me.  Lebron Nauert, Darlyne Russian, CMA

## 2013-05-02 NOTE — Telephone Encounter (Signed)
Patient would like a note saying that she cannot have a massage.  She would not explain if this was for work or for school or what exactly she needed this not for, she said she would explain it to the nurse when she calls.

## 2013-05-09 ENCOUNTER — Ambulatory Visit (INDEPENDENT_AMBULATORY_CARE_PROVIDER_SITE_OTHER): Payer: Medicaid Other | Admitting: Family Medicine

## 2013-05-09 VITALS — BP 114/64 | Wt 226.0 lb

## 2013-05-09 DIAGNOSIS — Z3483 Encounter for supervision of other normal pregnancy, third trimester: Secondary | ICD-10-CM

## 2013-05-09 DIAGNOSIS — Z3480 Encounter for supervision of other normal pregnancy, unspecified trimester: Secondary | ICD-10-CM

## 2013-05-09 DIAGNOSIS — N898 Other specified noninflammatory disorders of vagina: Secondary | ICD-10-CM

## 2013-05-09 LAB — POCT WET PREP (WET MOUNT): Clue Cells Wet Prep Whiff POC: NEGATIVE

## 2013-05-09 NOTE — Progress Notes (Signed)
S: 27 y.o. [id number] @ [redacted]w[redacted]d  (EDC 07/11/2013, by Last Menstrual Period )  -Doing well, no complaints -Good fetal movement: yes -Concerns include: still having some pelvic discomfort, unchanged from previous   O:  Filed Vitals:   05/09/13 1051  BP: 114/64   See OB flowsheet GEN: Well-developed female in no acute distress, obese.  ABD: Gravid. Positive fetal movement appreciated.  EXT: no edema.   A/P: 27 y.o. G4P3003 @ [redacted]w[redacted]d  (EDC 07/11/2013, by Last Menstrual Period )  -Wet prep done due to vaginal d/c per pt-- will treat based on results. --> NEGATIVE, no need to treat -f/u in 2 weeks with PCP or OB clinic

## 2013-05-09 NOTE — Patient Instructions (Signed)
Come back ini 2 weeks to see Dr. Gwenlyn Saran or [facility] clinic.   Pregnancy - Third Trimester The third trimester of pregnancy (the last 3 months) is a period of the most rapid growth for you and your baby. The baby approaches a length of 20 inches and a weight of 6 to 10 pounds. The baby is adding on fat and getting ready for life outside your body. While inside, babies have periods of sleeping and waking, sucking thumbs, and hiccuping. You can often feel small contractions of the uterus. This is false labor. It is also called Braxton-Hicks contractions. This is like a practice for labor. The usual problems in this stage of pregnancy include more difficulty breathing, swelling of the hands and feet from water retention, and having to urinate more often because of the uterus and baby pressing on your bladder.  PRENATAL EXAMS  Blood work may continue to be done during prenatal exams. These tests are done to check on your health and the probable health of your baby. Blood work is used to follow your blood levels (hemoglobin). Anemia (low hemoglobin) is common during pregnancy. Iron and vitamins are given to help prevent this. You may also continue to be checked for diabetes. Some of the past blood tests may be done again.  The size of the uterus is measured during each visit. This makes sure your baby is growing properly according to your pregnancy dates.  Your blood pressure is checked every prenatal visit. This is to make sure you are not getting toxemia.  Your urine is checked every prenatal visit for infection, diabetes, and protein.  Your weight is checked at each visit. This is done to make sure gains are happening at the suggested rate and that you and your baby are growing normally.  Sometimes, an ultrasound is performed to confirm the position and the proper growth and development of the baby. This is a test done that bounces harmless sound waves off the baby so your caregiver can more accurately  determine a due date.  Discuss the type of pain medicine and anesthesia you will have during your labor and delivery.  Discuss the possibility and anesthesia if a cesarean section might be necessary.  Inform your caregiver if there is any mental or physical violence at home. Sometimes, a specialized non-stress test, contraction stress test, and biophysical profile are done to make sure the baby is not having a problem. Checking the amniotic fluid surrounding the baby is called an amniocentesis. The amniotic fluid is removed by sticking a needle into the belly (abdomen). This is sometimes done near the end of pregnancy if an early delivery is required. In this case, it is done to help make sure the baby's lungs are mature enough for the baby to live outside of the womb. If the lungs are not mature and it is unsafe to deliver the baby, an injection of cortisone medicine is given to the mother 1 to 2 days before the delivery. This helps the baby's lungs mature and makes it safer to deliver the baby. CHANGES OCCURING IN THE THIRD TRIMESTER OF PREGNANCY Your body goes through many changes during pregnancy. They vary from person to person. Talk to your caregiver about changes you notice and are concerned about.  During the last trimester, you have probably had an increase in your appetite. It is normal to have cravings for certain foods. This varies from person to person and pregnancy to pregnancy.  You may begin to get stretch  marks on your hips, abdomen, and breasts. These are normal changes in the body during pregnancy. There are no exercises or medicines to take which prevent this change.  Constipation may be treated with a stool softener or adding bulk to your diet. Drinking lots of fluids, fiber in vegetables, fruits, and whole grains are helpful.  Exercising is also helpful. If you have been very active up until your pregnancy, most of these activities can be continued during your pregnancy. If you  have been less active, it is helpful to start an exercise program such as walking. Consult your caregiver before starting exercise programs.  Avoid all smoking, alcohol, non-prescribed drugs, herbs and "street drugs" during your pregnancy. These chemicals affect the formation and growth of the baby. Avoid chemicals throughout the pregnancy to ensure the delivery of a healthy infant.  Backache, varicose veins, and hemorrhoids may develop or get worse.  You will tire more easily in the third trimester, which is normal.  The baby's movements may be stronger and more often.  You may become short of breath easily.  Your belly button may stick out.  A yellow discharge may leak from your breasts called colostrum.  You may have a bloody mucus discharge. This usually occurs a few days to a week before labor begins. HOME CARE INSTRUCTIONS   Keep your caregiver's appointments. Follow your caregiver's instructions regarding medicine use, exercise, and diet.  During pregnancy, you are providing food for you and your baby. Continue to eat regular, well-balanced meals. Choose foods such as meat, fish, milk and other low fat dairy products, vegetables, fruits, and whole-grain breads and cereals. Your caregiver will tell you of the ideal weight gain.  A physical sexual relationship may be continued throughout pregnancy if there are no other problems such as early (premature) leaking of amniotic fluid from the membranes, vaginal bleeding, or belly (abdominal) pain.  Exercise regularly if there are no restrictions. Check with your caregiver if you are unsure of the safety of your exercises. Greater weight gain will occur in the last 2 trimesters of pregnancy. Exercising helps:  Control your weight.  Get you in shape for labor and delivery.  You lose weight after you deliver.  Rest a lot with legs elevated, or as needed for leg cramps or low back pain.  Wear a good support or jogging bra for breast  tenderness during pregnancy. This may help if worn during sleep. Pads or tissues may be used in the bra if you are leaking colostrum.  Do not use hot tubs, steam rooms, or saunas.  Wear your seat belt when driving. This protects you and your baby if you are in an accident.  Avoid raw meat, cat litter boxes and soil used by cats. These carry germs that can cause birth defects in the baby.  It is easier to leak urine during pregnancy. Tightening up and strengthening the pelvic muscles will help with this problem. You can practice stopping your urination while you are going to the bathroom. These are the same muscles you need to strengthen. It is also the muscles you would use if you were trying to stop from passing gas. You can practice tightening these muscles up 10 times a set and repeating this about 3 times per day. Once you know what muscles to tighten up, do not perform these exercises during urination. It is more likely to cause an infection by backing up the urine.  Ask for help if you have financial, counseling, or  nutritional needs during pregnancy. Your caregiver will be able to offer counseling for these needs as well as refer you for other special needs.  Make a list of emergency phone numbers and have them available.  Plan on getting help from family or friends when you go home from the hospital.  Make a trial run to the hospital.  Take prenatal classes with the father to understand, practice, and ask questions about the labor and delivery.  Prepare the baby's room or nursery.  Do not travel out of the city unless it is absolutely necessary and with the advice of your caregiver.  Wear only low or no heal shoes to have better balance and prevent falling. MEDICINES AND DRUG USE IN PREGNANCY  Take prenatal vitamins as directed. The vitamin should contain 1 milligram of folic acid. Keep all vitamins out of reach of children. Only a couple vitamins or tablets containing iron may be  fatal to a baby or young child when ingested.  Avoid use of all medicines, including herbs, over-the-counter medicines, not prescribed or suggested by your caregiver. Only take over-the-counter or prescription medicines for pain, discomfort, or fever as directed by your caregiver. Do not use aspirin, ibuprofen or naproxen unless approved by your caregiver.  Let your caregiver also know about herbs you may be using.  Alcohol is related to a number of birth defects. This includes fetal alcohol syndrome. All alcohol, in any form, should be avoided completely. Smoking will cause low birth rate and premature babies.  Illegal drugs are very harmful to the baby. They are absolutely forbidden. A baby born to an addicted mother will be addicted at birth. The baby will go through the same withdrawal an adult does. SEEK MEDICAL CARE IF: You have any concerns or worries during your pregnancy. It is better to call with your questions if you feel they cannot wait, rather than worry about them. SEEK IMMEDIATE MEDICAL CARE IF:   An unexplained oral temperature above 102 F (38.9 C) develops, or as your caregiver suggests.  You have leaking of fluid from the vagina. If leaking membranes are suspected, take your temperature and tell your caregiver of this when you call.  There is vaginal spotting, bleeding or passing clots. Tell your caregiver of the amount and how many pads are used.  You develop a bad smelling vaginal discharge with a change in the color from clear to white.  You develop vomiting that lasts more than 24 hours.  You develop chills or fever.  You develop shortness of breath.  You develop burning on urination.  You loose more than 2 pounds of weight or gain more than 2 pounds of weight or as suggested by your caregiver.  You notice sudden swelling of your face, hands, and feet or legs.  You develop belly (abdominal) pain. Round ligament discomfort is a common non-cancerous (benign)  cause of abdominal pain in pregnancy. Your caregiver still must evaluate you.  You develop a severe headache that does not go away.  You develop visual problems, blurred or double vision.  If you have not felt your baby move for more than 1 hour. If you think the baby is not moving as much as usual, eat something with sugar in it and lie down on your left side for an hour. The baby should move at least 4 to 5 times per hour. Call right away if your baby moves less than that.  You fall, are in a car accident, or any kind  of trauma.  There is mental or physical violence at home. Document Released: 11/11/2001 Document Revised: 08/11/2012 Document Reviewed: 05/16/2009 [facility] Patient Information 2014 [place], [place].

## 2013-05-27 ENCOUNTER — Telehealth: Payer: Self-pay | Admitting: Family Medicine

## 2013-05-27 ENCOUNTER — Ambulatory Visit (INDEPENDENT_AMBULATORY_CARE_PROVIDER_SITE_OTHER): Payer: Medicaid Other | Admitting: Family Medicine

## 2013-05-27 ENCOUNTER — Other Ambulatory Visit: Payer: Self-pay | Admitting: Family Medicine

## 2013-05-27 VITALS — BP 91/64 | Temp 98.1°F | Wt 228.0 lb

## 2013-05-27 DIAGNOSIS — N898 Other specified noninflammatory disorders of vagina: Secondary | ICD-10-CM

## 2013-05-27 DIAGNOSIS — R300 Dysuria: Secondary | ICD-10-CM

## 2013-05-27 DIAGNOSIS — L293 Anogenital pruritus, unspecified: Secondary | ICD-10-CM

## 2013-05-27 DIAGNOSIS — Z3480 Encounter for supervision of other normal pregnancy, unspecified trimester: Secondary | ICD-10-CM

## 2013-05-27 DIAGNOSIS — Z3482 Encounter for supervision of other normal pregnancy, second trimester: Secondary | ICD-10-CM

## 2013-05-27 LAB — POCT URINALYSIS DIPSTICK
Ketones, UA: NEGATIVE
Nitrite, UA: NEGATIVE
Protein, UA: NEGATIVE
Urobilinogen, UA: 1
pH, UA: 7

## 2013-05-27 LAB — POCT UA - MICROSCOPIC ONLY

## 2013-05-27 LAB — POCT WET PREP (WET MOUNT)

## 2013-05-27 MED ORDER — ONDANSETRON HCL 4 MG PO TABS: 4.0000 mg | ORAL_TABLET | Freq: Three times a day (TID) | ORAL | Status: DC | PRN

## 2013-05-27 MED ORDER — RANITIDINE HCL 150 MG PO TABS: 150.0000 mg | ORAL_TABLET | Freq: Two times a day (BID) | ORAL | Status: DC

## 2013-05-27 NOTE — Addendum Note (Signed)
Addended by: Jennette Bill on: 05/27/2013 01:49 PM   Modules accepted: Orders

## 2013-05-27 NOTE — Progress Notes (Signed)
S: presents for routine prenatal visit.  Having some occasional contractions, no more than twice a day. Able to talk through them. No loss of fluid, no vaginal bleeding.  Having some discharge like cottage cheese x2-3 weeks. Some dysuria.  Also reports occasional nausea, no vomiting. Has a gaging sensation at times with burning in her throat.  O: see flowsheet Speculum exam: whitish discharge present, otherwise normal appearing vagina and cervix.  A/P:  27 yo G4P3003 at 33.4wga who presents for routine prenatal care.  - vaginal discharge: follow up wet prep results and UA - Braxton Hicks contractions without cervical change. Preterm labor precautions reviewed - nausea: likely component of GERD. Rx for zantac sent. If patient has severe nausea, zofran 10tab sent to pharmacy - plans on breastfeeding - follow up in 2 weeks for GC/Chl and GBS. At that time, will ask about pediatrician and birth control.

## 2013-05-27 NOTE — Telephone Encounter (Signed)
Called patient to let her know that her wet prep was normal. Follow up urine culture, since some leuks on UA.  Patient expressed understanding.  Marena Chancy, PGY-2 Family Medicine Resident

## 2013-05-30 LAB — CULTURE, OB URINE: Colony Count: >=100000

## 2013-06-10 ENCOUNTER — Ambulatory Visit (INDEPENDENT_AMBULATORY_CARE_PROVIDER_SITE_OTHER): Payer: Medicaid Other | Admitting: Family Medicine

## 2013-06-10 ENCOUNTER — Other Ambulatory Visit (HOSPITAL_COMMUNITY)
Admission: RE | Admit: 2013-06-10 | Discharge: 2013-06-10 | Disposition: A | Discharge status: Home / Self Care | Payer: Medicaid Other | Source: Ambulatory Visit | Attending: Family Medicine | Admitting: Family Medicine

## 2013-06-10 VITALS — BP 123/84 | Wt 228.0 lb

## 2013-06-10 DIAGNOSIS — Z3483 Encounter for supervision of other normal pregnancy, third trimester: Secondary | ICD-10-CM

## 2013-06-10 DIAGNOSIS — Z113 Encounter for screening for infections with a predominantly sexual mode of transmission: Secondary | ICD-10-CM | POA: Insufficient documentation

## 2013-06-10 DIAGNOSIS — Z3480 Encounter for supervision of other normal pregnancy, unspecified trimester: Secondary | ICD-10-CM

## 2013-06-10 MED ORDER — RANITIDINE HCL 150 MG PO CAPS: 150.0000 mg | ORAL_CAPSULE | Freq: Two times a day (BID) | ORAL | Status: DC

## 2013-06-10 NOTE — Progress Notes (Signed)
S: routine prenatal visit.  Complains of bowel movement every time she eats. Some nausea, some regurgitation. No vomiting. Abdominal discomfort in epigastric region. Has not filled the zantac.  Intermittent contractions 6-8 times per day, occuring every 10-27minutes when they occur. No loss of fluid, no vaginal bleeding. Feeling good fetal movement O: see flowsheet Abdomen: tenderness in epigastric region A/P: 27 yo G4P3003 at 35.4wga who presented for prenatal visit - diarrhea: continue hydration. Imodium sparingly, only if profuse diarrhea - abdominal pain: likely from GERD. Ranitidine sent again to pharm - birth control: undecided - feeding: undecided but has breastfed her previous children - reviewed labor precautions - GBS, GC,Chl done today - follow up in 1 week

## 2013-06-10 NOTE — Patient Instructions (Addendum)
For the diarrhea, you can take immodium if the constipation is very bad. Otherwise, keep drinking water and staying hydrated.

## 2013-06-12 ENCOUNTER — Encounter: Payer: Self-pay | Admitting: Family Medicine

## 2013-06-15 ENCOUNTER — Encounter: Payer: Self-pay | Admitting: Family Medicine

## 2013-06-17 ENCOUNTER — Ambulatory Visit (INDEPENDENT_AMBULATORY_CARE_PROVIDER_SITE_OTHER): Payer: Medicaid Other | Admitting: Family Medicine

## 2013-06-17 VITALS — BP 130/80 | Wt 228.0 lb

## 2013-06-17 DIAGNOSIS — Z3480 Encounter for supervision of other normal pregnancy, unspecified trimester: Secondary | ICD-10-CM

## 2013-06-17 DIAGNOSIS — Z3483 Encounter for supervision of other normal pregnancy, third trimester: Secondary | ICD-10-CM

## 2013-06-19 NOTE — Progress Notes (Signed)
S; routine prenatal visit. No complaint other than contractions that now occur 7-8 times per day. Tightening of abdomen that lasts a few minutes. No loss of fluid. Reports thick discharge. No blood. Feels fetal movement O: see flowsheet A/P: 26 yo G4P3003 at 40.4 who presents for routine prenatal visit.  - GBS, GC/Chl all negative - labor precautions reviewed - follow up in 1 week.

## 2013-06-23 ENCOUNTER — Ambulatory Visit (INDEPENDENT_AMBULATORY_CARE_PROVIDER_SITE_OTHER): Payer: Medicaid Other | Admitting: Family Medicine

## 2013-06-23 ENCOUNTER — Encounter: Payer: Self-pay | Admitting: Family Medicine

## 2013-06-23 VITALS — BP 117/80 | Temp 98.4°F | Wt 232.0 lb

## 2013-06-23 DIAGNOSIS — Z3483 Encounter for supervision of other normal pregnancy, third trimester: Secondary | ICD-10-CM

## 2013-06-23 DIAGNOSIS — Z3480 Encounter for supervision of other normal pregnancy, unspecified trimester: Secondary | ICD-10-CM

## 2013-06-23 NOTE — Progress Notes (Signed)
27 year old G65P3003 @ [redacted]w[redacted]d by LMP who presents for regular prenatal visit. Coplains of short contractions at night < 1 minute in duration O: see flow sheet A/P: reviewed plan for VBAC; patient knows indications to go to [facility] hospital; F/u in 1 week

## 2013-06-23 NOTE — Patient Instructions (Signed)
Dear [name] [name],   Please go to [facility] if your water breaks or if you have more than 3 contractions in 1 hour.   Follow up in our clinic in one week.   Sincerely,   Dr. Clinton Sawyer

## 2013-07-01 ENCOUNTER — Ambulatory Visit (INDEPENDENT_AMBULATORY_CARE_PROVIDER_SITE_OTHER): Payer: Medicaid Other | Admitting: Family Medicine

## 2013-07-01 VITALS — BP 117/83 | Wt 228.0 lb

## 2013-07-01 DIAGNOSIS — Z3483 Encounter for supervision of other normal pregnancy, third trimester: Secondary | ICD-10-CM

## 2013-07-01 DIAGNOSIS — Z3480 Encounter for supervision of other normal pregnancy, unspecified trimester: Secondary | ICD-10-CM

## 2013-07-01 DIAGNOSIS — Z8619 Personal history of other infectious and parasitic diseases: Secondary | ICD-10-CM

## 2013-07-01 MED ORDER — ACYCLOVIR 400 MG PO TABS: 400.0000 mg | ORAL_TABLET | Freq: Three times a day (TID) | ORAL | Status: DC

## 2013-07-01 NOTE — Patient Instructions (Addendum)
You are currently 2.5cm dilated. If your contractions start to pick up, you start to feel more pressure, your water breaks, baby isn't moving well, or you have any other concerns you can go to [facility] to be evaluated.  You also need an appointment with the OB/GYN doctors at [facility] to discuss Vaginal Birth After Cesarean section. We will call you to set that up.  I sent in a prescription for acyclovir. You'll take this 3 times per day.  Otherwise schedule a follow up appointment with Dr. Gwenlyn Saran in one week.  Pregnancy - Third Trimester The third trimester of pregnancy (the last 3 months) is a period of the most rapid growth for you and your baby. The baby approaches a length of 20 inches and a weight of 6 to 10 pounds. The baby is adding on fat and getting ready for life outside your body. While inside, babies have periods of sleeping and waking, sucking thumbs, and hiccuping. You can often feel small contractions of the uterus. This is false labor. It is also called Braxton-Hicks contractions. This is like a practice for labor. The usual problems in this stage of pregnancy include more difficulty breathing, swelling of the hands and feet from water retention, and having to urinate more often because of the uterus and baby pressing on your bladder.  PRENATAL EXAMS  Blood work may continue to be done during prenatal exams. These tests are done to check on your health and the probable health of your baby. Blood work is used to follow your blood levels (hemoglobin). Anemia (low hemoglobin) is common during pregnancy. Iron and vitamins are given to help prevent this. You may also continue to be checked for diabetes. Some of the past blood tests may be done again.  The size of the uterus is measured during each visit. This makes sure your baby is growing properly according to your pregnancy dates.  Your blood pressure is checked every prenatal visit. This is to make sure you are not  getting toxemia.  Your urine is checked every prenatal visit for infection, diabetes, and protein.  Your weight is checked at each visit. This is done to make sure gains are happening at the suggested rate and that you and your baby are growing normally.  Sometimes, an ultrasound is performed to confirm the position and the proper growth and development of the baby. This is a test done that bounces harmless sound waves off the baby so your caregiver can more accurately determine a due date.  Discuss the type of pain medicine and anesthesia you will have during your labor and delivery.  Discuss the possibility and anesthesia if a cesarean section might be necessary.  Inform your caregiver if there is any mental or physical violence at home. Sometimes, a specialized non-stress test, contraction stress test, and biophysical profile are done to make sure the baby is not having a problem. Checking the amniotic fluid surrounding the baby is called an amniocentesis. The amniotic fluid is removed by sticking a needle into the belly (abdomen). This is sometimes done near the end of pregnancy if an early delivery is required. In this case, it is done to help make sure the baby's lungs are mature enough for the baby to live outside of the womb. If the lungs are not mature and it is unsafe to deliver the baby, an injection of cortisone medicine is given to the mother 1 to 2 days before the delivery. This helps the baby's lungs  mature and makes it safer to deliver the baby. CHANGES OCCURING IN THE THIRD TRIMESTER OF PREGNANCY Your body goes through many changes during pregnancy. They vary from person to person. Talk to your caregiver about changes you notice and are concerned about.  During the last trimester, you have probably had an increase in your appetite. It is normal to have cravings for certain foods. This varies from person to person and pregnancy to pregnancy.  You may begin to get stretch marks on  your hips, abdomen, and breasts. These are normal changes in the body during pregnancy. There are no exercises or medicines to take which prevent this change.  Constipation may be treated with a stool softener or adding bulk to your diet. Drinking lots of fluids, fiber in vegetables, fruits, and whole grains are helpful.  Exercising is also helpful. If you have been very active up until your pregnancy, most of these activities can be continued during your pregnancy. If you have been less active, it is helpful to start an exercise program such as walking. Consult your caregiver before starting exercise programs.  Avoid all smoking, alcohol, non-prescribed drugs, herbs and "street drugs" during your pregnancy. These chemicals affect the formation and growth of the baby. Avoid chemicals throughout the pregnancy to ensure the delivery of a healthy infant.  Backache, varicose veins, and hemorrhoids may develop or get worse.  You will tire more easily in the third trimester, which is normal.  The baby's movements may be stronger and more often.  You may become short of breath easily.  Your belly button may stick out.  A yellow discharge may leak from your breasts called colostrum.  You may have a bloody mucus discharge. This usually occurs a few days to a week before labor begins. HOME CARE INSTRUCTIONS   Keep your caregiver's appointments. Follow your caregiver's instructions regarding medicine use, exercise, and diet.  During pregnancy, you are providing food for you and your baby. Continue to eat regular, well-balanced meals. Choose foods such as meat, fish, milk and other low fat dairy products, vegetables, fruits, and whole-grain breads and cereals. Your caregiver will tell you of the ideal weight gain.  A physical sexual relationship may be continued throughout pregnancy if there are no other problems such as early (premature) leaking of amniotic fluid from the membranes, vaginal bleeding,  or belly (abdominal) pain.  Exercise regularly if there are no restrictions. Check with your caregiver if you are unsure of the safety of your exercises. Greater weight gain will occur in the last 2 trimesters of pregnancy. Exercising helps:  Control your weight.  Get you in shape for labor and delivery.  You lose weight after you deliver.  Rest a lot with legs elevated, or as needed for leg cramps or low back pain.  Wear a good support or jogging bra for breast tenderness during pregnancy. This may help if worn during sleep. Pads or tissues may be used in the bra if you are leaking colostrum.  Do not use hot tubs, steam rooms, or saunas.  Wear your seat belt when driving. This protects you and your baby if you are in an accident.  Avoid raw meat, cat litter boxes and soil used by cats. These carry germs that can cause birth defects in the baby.  It is easier to leak urine during pregnancy. Tightening up and strengthening the pelvic muscles will help with this problem. You can practice stopping your urination while you are going to the bathroom.  These are the same muscles you need to strengthen. It is also the muscles you would use if you were trying to stop from passing gas. You can practice tightening these muscles up 10 times a set and repeating this about 3 times per day. Once you know what muscles to tighten up, do not perform these exercises during urination. It is more likely to cause an infection by backing up the urine.  Ask for help if you have financial, counseling, or nutritional needs during pregnancy. Your caregiver will be able to offer counseling for these needs as well as refer you for other special needs.  Make a list of emergency phone numbers and have them available.  Plan on getting help from family or friends when you go home from the hospital.  Make a trial run to the hospital.  Take prenatal classes with the father to understand, practice, and ask questions about  the labor and delivery.  Prepare the baby's room or nursery.  Do not travel out of the city unless it is absolutely necessary and with the advice of your caregiver.  Wear only low or no heal shoes to have better balance and prevent falling. MEDICINES AND DRUG USE IN PREGNANCY  Take prenatal vitamins as directed. The vitamin should contain 1 milligram of folic acid. Keep all vitamins out of reach of children. Only a couple vitamins or tablets containing iron may be fatal to a baby or young child when ingested.  Avoid use of all medicines, including herbs, over-the-counter medicines, not prescribed or suggested by your caregiver. Only take over-the-counter or prescription medicines for pain, discomfort, or fever as directed by your caregiver. Do not use aspirin, ibuprofen or naproxen unless approved by your caregiver.  Let your caregiver also know about herbs you may be using.  Alcohol is related to a number of birth defects. This includes fetal alcohol syndrome. All alcohol, in any form, should be avoided completely. Smoking will cause low birth rate and premature babies.  Illegal drugs are very harmful to the baby. They are absolutely forbidden. A baby born to an addicted mother will be addicted at birth. The baby will go through the same withdrawal an adult does. SEEK MEDICAL CARE IF: You have any concerns or worries during your pregnancy. It is better to call with your questions if you feel they cannot wait, rather than worry about them. SEEK IMMEDIATE MEDICAL CARE IF:   An unexplained oral temperature above 102 F (38.9 C) develops, or as your caregiver suggests.  You have leaking of fluid from the vagina. If leaking membranes are suspected, take your temperature and tell your caregiver of this when you call.  There is vaginal spotting, bleeding or passing clots. Tell your caregiver of the amount and how many pads are used.  You develop a bad smelling vaginal discharge with a change  in the color from clear to white.  You develop vomiting that lasts more than 24 hours.  You develop chills or fever.  You develop shortness of breath.  You develop burning on urination.  You loose more than 2 pounds of weight or gain more than 2 pounds of weight or as suggested by your caregiver.  You notice sudden swelling of your face, hands, and feet or legs.  You develop belly (abdominal) pain. Round ligament discomfort is a common non-cancerous (benign) cause of abdominal pain in pregnancy. Your caregiver still must evaluate you.  You develop a severe headache that does not go away.  You develop visual problems, blurred or double vision.  If you have not felt your baby move for more than 1 hour. If you think the baby is not moving as much as usual, eat something with sugar in it and lie down on your left side for an hour. The baby should move at least 4 to 5 times per hour. Call right away if your baby moves less than that.  You fall, are in a car accident, or any kind of trauma.  There is mental or physical violence at home. Document Released: 11/11/2001 Document Revised: 08/11/2012 Document Reviewed: 05/16/2009 [facility] Patient Information 2014 [place], [place].   Vaginal Birth After Cesarean Delivery Vaginal birth after Cesarean delivery (VBAC) is giving birth vaginally after previously delivering a baby by a cesarean. In the past, if a woman had a Cesarean delivery, all births afterwards would be done by Cesarean delivery. This is no longer true. It can be safe for the mother to try a vaginal delivery after having a Cesarean. The final decision to have a VBAC or repeat Cesarean delivery should be between the patient and her caregiver. The risks and benefits can be discussed relative to the reason for, and the type of the previous Cesarean delivery. WOMEN WHO PLAN TO HAVE A VBAC SHOULD CHECK WITH THEIR DOCTOR TO BE SURE THAT:  The previous Cesarean was done with a low  transverse uterine incision (not a vertical classical incision).  The birth canal is big enough for the baby.  There were no other operations on the uterus.  They will have an electronic fetal monitor (EFM) on at all times during labor.  An operating room would be available and ready in case an emergency Cesarean is needed.  A doctor and surgical nursing staff would be available at all times during labor to be ready to do an emergency Cesarean if necessary.  An anesthesiologist would be present in case an emergency Cesarean is needed.  The nursery is prepared and has adequate personnel and necessary equipment available to care for the baby in case of an emergency Cesarean. BENEFITS OF VBAC:  Shorter stay in the hospital.  Lower delivery, nursery and hospital costs.  Less blood loss and need for blood transfusions.  Less fever and discomfort from major surgery.  Lower risk of blood clots.  Lower risk of infection.  Shorter recovery after going home.  Lower risk of other surgical complications, such as opening of the incision or hernia in the incision.  Decreased risk of injury to other organs.  Decreased risk for having to remove the uterus (hysterectomy).  Decreased risk for the placenta to completely or partially cover the opening of the uterus (placenta previa) with a future pregnancy.  Ability to have a larger family if desired. RISKS OF A VBAC:  Rupture of the uterus.  Having to remove the uterus (hysterectomy) if it ruptures.  All the complications of major surgery and/or injury to other organs.  Excessive bleeding, blood clots and infection.  Lower Apgar scores (method to evaluate the newborn based on appearance, pulse, grimace, activity, and respiration) and more risks to the baby.  There is a higher risk of uterine rupture if you induce or augment labor.  There is a higher risk of uterine rupture if you use medications to ripen the cervix. VBAC SHOULD  NOT BE DONE IF:  The previous Cesarean was done with a vertical (classical) or T-shaped incision, or you do not know what kind of an incision was  made.  You had a ruptured uterus.  You had surgery on your uterus.  You have medical or obstetrical problems.  There are problems with the baby.  There were two previous Cesarean deliveries and no vaginal deliveries. OTHER FACTS TO KNOW ABOUT VBAC:  It is safe to have an epidural anesthetic with VBAC.  It is safe to turn the baby from a breech position (attempt an external cephalic version).  It is safe to try a VBAC with twins.  Pregnancies later than 40 weeks have not been successful with VBAC.  There is an increased failure rate of a VBAC in obese pregnant women.  There is an increased failure rate with VABC if the baby weighs 8.8 pounds (4000 grams) or more.  There is an increased failure rate if the time between the Cesarean and VBAC is less than 19 months.  There is an increased failure rate if pre-eclampsia is present (high blood pressure, protein in the urine and swelling of face and extremities).  VBAC is very successful if there was a previous vaginal birth.  VBAC is very successful when the labor starts spontaneously before the due date.  Delivery of VBAC is similar to having a normal spontaneous vaginal delivery. It is important to discuss VBAC with your caregiver early in the pregnancy so you can understand the risks, benefits and options. It will give you time to decide what is best in your particular case relevant to the reason for your previous Cesarean delivery. It should be understood that medical changes in the mother or pregnancy may occur during the pregnancy, which make it necessary to change you or your caregiver's initial decision. The counseling, concerns and decisions should be documented in the medical record and signed by all parties. Document Released: 05/10/2007 Document Revised: 02/09/2012 Document  Reviewed: 12/29/2008 [facility] Patient Information 2014 [place], [place].

## 2013-07-02 NOTE — Progress Notes (Signed)
[name] [name] is a 27 y.o. [phone] at [redacted]w[redacted]d for routine follow up.  She reports that she has been having contractions occuring about 4-5 times per hour. Has also noted a lot of pressure down below. Good fetal movement. Denies having vaginal bleeding or fluid leaking. See flow sheet for details.  A/P: Pregnancy at [redacted]w[redacted]d.  Doing well. Cervical exam shows 2.5cm/70/ballotable. Vertex presentation. Not in active labor at this time. Advised patient to go to [facility] if contractions pick up, water breaks, has vaginal bleeding, or decreased fetal movement. Discussed with patient her history of genital herpes, and offered acyclovir suppressive therapy, which she agreed to take. Rx written. No lesions noted on exam. GBS/GC/CZ results were reviewed today - all negative. Patient has hx of C/S and desires VBAC but states she has not been to [facility] OB clinic to discuss VBAC and sign consent form. Will refer pt to [facility] OB clinic to have this discussion. Precepted with Dr. Mahala Menghini, who also examined patient's cervix and agrees with this plan. F/u in 1 week.

## 2013-07-03 ENCOUNTER — Telehealth: Payer: Self-pay | Admitting: Family Medicine

## 2013-07-03 NOTE — Telephone Encounter (Signed)
[facility] red team - I saw this patient on Friday for prenatal visit. She stated that she hasn't been to the [facility] clinic at [facility] for [facility] consent. Can we call and get her set up to do this? Needs to happen ASAP since she's over [redacted] weeks gestation. Thanks!

## 2013-07-04 NOTE — Telephone Encounter (Signed)
Called [facility]  [facility] [place] reports, that the consultation for VBAC will no longer be done at [facility]. She said, that Dr.Pratt should do these consultations. (I do not know anything about this). Pt is 39 w and I will route this back to Dr.McIntyre. Lorenda Hatchet, Elijahjames Fuelling P.S. I do not see orders in chart.

## 2013-07-04 NOTE — Telephone Encounter (Signed)
I called and spoke with both Dr. Mauricio Po and Dr. Jolayne Panther of the [facility] faculty practice to clarify this issue. Our patients are apparently now supposed to do VBAC consents at the [facility] with Dr. Shawnie Pons whenever she is in clinic. From what I am able to tell in Epic and on amion, Dr. Shawnie Pons is not in clinic at the [facility] until the end of this month. It is likely that this patient will have delivered her baby by then.   [facility] red team, could you call the [facility] clinic and ask them to please schedule this pt for a VBAC consent appointment some time this week, making an exception to the current new rule? Dr. Shawnie Pons is not in clinic at the [facility] at all this week and Dr. Jolayne Panther wanted this patient scheduled for a consent appointment this week if at all possible. Will cc Dr. Mauricio Po, Shawnie Pons, and pt's PCP on this so all can stay in the loop.  Thanks! [place]

## 2013-07-05 ENCOUNTER — Telehealth: Payer: Self-pay | Admitting: *Deleted

## 2013-07-05 NOTE — Telephone Encounter (Signed)
Attempted three times to reach patient and phone does not have any minutes.  Tried pt's  father and phone is the same.  Lior Cartelli, Darlyne Russian, CMA

## 2013-07-05 NOTE — Telephone Encounter (Signed)
Pt is going to be worked in tomorrow 07/06/2013 for VBAC consent appt.  She is to report to the ground floor at [facility] and the OB/GYN office.  Attempted to reach patient, but phone is not on.  Will try later.   Ellington [place]lade, Darlyne Russian, CMA

## 2013-07-06 ENCOUNTER — Telehealth: Payer: Self-pay | Admitting: *Deleted

## 2013-07-06 NOTE — Telephone Encounter (Signed)
I tried to call patient this morning regarding her VBAC consent appt at [facility] and do not have a working number for patient.   Drequan Ironside, Darlyne Russian, CMA

## 2013-07-08 ENCOUNTER — Ambulatory Visit (INDEPENDENT_AMBULATORY_CARE_PROVIDER_SITE_OTHER): Payer: Medicaid Other | Admitting: Family Medicine

## 2013-07-08 VITALS — BP 112/78 | Temp 98.1°F | Wt 223.7 lb

## 2013-07-08 DIAGNOSIS — Z3480 Encounter for supervision of other normal pregnancy, unspecified trimester: Secondary | ICD-10-CM

## 2013-07-08 DIAGNOSIS — Z3483 Encounter for supervision of other normal pregnancy, third trimester: Secondary | ICD-10-CM

## 2013-07-08 NOTE — Patient Instructions (Addendum)
Come back to see me on Wednesday. Ok to double book.  If you have any worsening contractions or more leaking of fluid, please go to the [facility]au.

## 2013-07-08 NOTE — Progress Notes (Signed)
S: having contractions 5-6 times in an hour, not every hour. Occurring at night time as well. She states that she feels that she had some leaking but is not sure if it was clear liquid. Has noticed it since Tuesd-Wednesday. Reports good fetal movement. No vaginal itching or odor. No vaginal bleeding. Lost mucous plug Wednesday.  O: see flowsheet.  Pelvic exam: no pooling, copious white discharge A/P: 27 yo [id number] at 39.4 who presents for prenatal follow up.  - rule out SROM: ferning and nitrazine swab negative without evidence of pooling - h/o c-section with 1st pregnancy followed by successful 2 VBAC's: patient has not been reachable by phone to make appointment for [facility] counseling. She has been resistant to go for counseling. I reiterated importance for her to go to appointment at [facility]. She expressed understanding and agreed.   - contractions: not in active labor at this time. Scheduled NST and BPP for next week since she will be 40 weeks.  - reviewed labor precautions - follow up in 1 week.

## 2013-07-12 ENCOUNTER — Encounter (HOSPITAL_COMMUNITY): Payer: Self-pay | Admitting: *Deleted

## 2013-07-12 ENCOUNTER — Inpatient Hospital Stay (HOSPITAL_COMMUNITY)
Admission: AD | Admit: 2013-07-12 | Discharge: 2013-07-15 | DRG: 774 | Disposition: A | Discharge status: Home / Self Care | Payer: Medicaid Other | Source: Ambulatory Visit | Attending: Obstetrics & Gynecology | Admitting: Obstetrics & Gynecology

## 2013-07-12 DIAGNOSIS — B009 Herpesviral infection, unspecified: Secondary | ICD-10-CM | POA: Diagnosis present

## 2013-07-12 DIAGNOSIS — O98519 Other viral diseases complicating pregnancy, unspecified trimester: Secondary | ICD-10-CM | POA: Diagnosis present

## 2013-07-12 DIAGNOSIS — O34219 Maternal care for unspecified type scar from previous cesarean delivery: Secondary | ICD-10-CM | POA: Diagnosis present

## 2013-07-12 DIAGNOSIS — E669 Obesity, unspecified: Secondary | ICD-10-CM | POA: Diagnosis present

## 2013-07-12 DIAGNOSIS — O26899 Other specified pregnancy related conditions, unspecified trimester: Principal | ICD-10-CM | POA: Diagnosis present

## 2013-07-12 LAB — CBC
HCT: 32.9 % — ABNORMAL LOW (ref 36.0–46.0)
Hemoglobin: 11.1 g/dL — ABNORMAL LOW (ref 12.0–15.0)
MCH: 31.3 pg (ref 26.0–34.0)
MCHC: 33.7 g/dL (ref 30.0–36.0)

## 2013-07-12 LAB — PREPARE RBC (CROSSMATCH)

## 2013-07-12 MED ORDER — IBUPROFEN 600 MG PO TABS
600.0000 mg | ORAL_TABLET | Freq: Four times a day (QID) | ORAL | Status: DC | PRN
  Administered 2013-07-13: 600 mg via ORAL

## 2013-07-12 MED ORDER — ACETAMINOPHEN 325 MG PO TABS: 650.0000 mg | ORAL_TABLET | ORAL | Status: DC | PRN

## 2013-07-12 MED ORDER — OXYTOCIN 40 UNITS IN LACTATED RINGERS INFUSION - SIMPLE MED: 62.5000 mL/h | INTRAVENOUS | Status: DC

## 2013-07-12 MED ORDER — OXYTOCIN 40 UNITS IN LACTATED RINGERS INFUSION - SIMPLE MED
INTRAVENOUS | Status: AC
  Administered 2013-07-12: 1 m[IU]/min via INTRAVENOUS
  Filled 2013-07-12: qty 1000

## 2013-07-12 MED ORDER — OXYTOCIN BOLUS FROM INFUSION: 500.0000 mL | INTRAVENOUS | Status: DC

## 2013-07-12 MED ORDER — CITRIC ACID-SODIUM CITRATE 334-500 MG/5ML PO SOLN: 30.0000 mL | ORAL | Status: DC | PRN

## 2013-07-12 MED ORDER — TERBUTALINE SULFATE 1 MG/ML IJ SOLN: 0.2500 mg | Freq: Once | INTRAMUSCULAR | Status: AC | PRN

## 2013-07-12 MED ORDER — ACYCLOVIR 400 MG PO TABS
400.0000 mg | ORAL_TABLET | Freq: Three times a day (TID) | ORAL | Status: DC
  Administered 2013-07-12 (×2): 400 mg via ORAL
  Filled 2013-07-12 (×6): qty 1

## 2013-07-12 MED ORDER — ONDANSETRON HCL 4 MG/2ML IJ SOLN: 4.0000 mg | Freq: Four times a day (QID) | INTRAMUSCULAR | Status: DC | PRN

## 2013-07-12 MED ORDER — OXYCODONE-ACETAMINOPHEN 5-325 MG PO TABS: 1.0000 | ORAL_TABLET | ORAL | Status: DC | PRN

## 2013-07-12 MED ORDER — LACTATED RINGERS IV SOLN
500.0000 mL | INTRAVENOUS | Status: DC | PRN
  Administered 2013-07-12: 500 mL via INTRAVENOUS

## 2013-07-12 MED ORDER — LIDOCAINE HCL (PF) 1 % IJ SOLN
30.0000 mL | INTRAMUSCULAR | Status: DC | PRN
  Filled 2013-07-12 (×2): qty 30

## 2013-07-12 MED ORDER — LACTATED RINGERS IV SOLN
INTRAVENOUS | Status: DC
  Administered 2013-07-12 – 2013-07-13 (×4): via INTRAVENOUS

## 2013-07-12 MED ORDER — OXYTOCIN 40 UNITS IN LACTATED RINGERS INFUSION - SIMPLE MED
1.0000 m[IU]/min | INTRAVENOUS | Status: DC
  Administered 2013-07-12: 7 m[IU]/min via INTRAVENOUS
  Administered 2013-07-12: 4 m[IU]/min via INTRAVENOUS

## 2013-07-12 NOTE — Progress Notes (Signed)
Dr Ike Bene notified of pt's reck sve unchanged. Aware FM strip reassuring but not reactive. GIve pt juice and see if strip gets reactive. Aware baby moving well

## 2013-07-12 NOTE — H&P (Addendum)
[name] [name] is a 27 y.o. female [phone] at 49.1 EGA presenting for labor evaluation with regular, strong contractions since 3:00pm yesterday.  Admits to a lot of pelvic pressure and low back pain.  Denies vaginal bleeding or LOF. Maternal Medical History:  Reason for admission: Contractions and nausea.   Contractions: Onset was yesterday.   Frequency: regular.   Duration is approximately 1 minute.   Perceived severity is strong.    Fetal activity: Perceived fetal activity is normal.   Last perceived fetal movement was within the past hour.    Prenatal complications: Obesity, herpes labialis - started acyclovir last week  Prenatal Complications - Diabetes: none.    OB History   Grav Para Term Preterm Abortions TAB SAB Ect Mult Living   4 3 3       3      Past Medical History  Diagnosis Date  . Headache(784.0)   . Urinary tract infection   . Ovarian cyst   . Abnormal Pap smear     f/u was normal  . Infection     trich, chlamydia, gonorrhea  . Depression     doind good now   Past Surgical History  Procedure Laterality Date  . Implanon removal 08/2012  08/2012  . Cesarean section    . Tonsillectomy    . Wisdom tooth extraction     Family History: family history includes Asthma in her brother; Cancer in her maternal grandmother and maternal uncle; Diabetes in her maternal grandmother; and Hypertension in her maternal grandmother and mother.  There is no history of Other. Social History:  reports that she has never smoked. She has never used smokeless tobacco. She reports that  drinks alcohol. She reports that she does not use illicit drugs.   Prenatal Transfer Tool  Maternal Diabetes: No Genetic Screening: Declined Maternal Ultrasounds/Referrals: Normal Fetal Ultrasounds or other Referrals:  None Maternal Substance Abuse:  No Significant Maternal Medications:  Meds include: Other: acyclovir, ranitidine Significant Maternal Lab Results:  Lab values include: Group B Strep  negative, Rh negative, Other: herpes labialis positive Other Comments:  on acyclovir for herpes, undecided on contraception and deciding on breastfeeding  Review of Systems  Constitutional: Negative for fever and chills.  HENT:       Reports mild headache this morning, now resolved.  Eyes:       Denies vision changes.  Respiratory: Negative for shortness of breath.   Cardiovascular: Negative for chest pain, palpitations and leg swelling.  Gastrointestinal: Positive for nausea. Negative for vomiting, abdominal pain, diarrhea and constipation.  Neurological:       Reported dizziness yesterday.    Dilation: 4 Effacement (%): 60 Station: -2 Exam by:: Quintella Baton RNC Blood pressure 129/84, pulse 87, temperature 97.9 F (36.6 C), temperature source Oral, resp. rate 18, height 5' (1.524 m), weight 101.152 kg (223 lb), last menstrual period 10/04/2012. Maternal Exam:  Abdomen: Patient reports no abdominal tenderness. Fetal presentation: vertex  Introitus: not evaluated.   Pelvis: adequate for delivery.   Cervix: Cervix evaluated by digital exam.     Physical Exam  Constitutional: She is oriented to person, place, and time. She appears well-developed and well-nourished.  obese  HENT:  Head: Normocephalic and atraumatic.  Eyes: EOM are normal. Pupils are equal, round, and reactive to light.  Cardiovascular: Normal rate, regular rhythm and normal heart sounds.   Respiratory: Effort normal and breath sounds normal.  GI: Soft.  Neurological: She is alert and oriented to person, place, and  time.  Skin: Skin is warm and dry.  Psychiatric: She has a normal mood and affect. Her behavior is normal. Judgment and thought content normal.  Some difficulty understanding questions.    Prenatal labs: ABO, Rh: A/NEG/-- (12/23 1021) Antibody: NEG (05/22 1035) Rubella: 2.88 (12/23 1021) RPR: NON REAC (05/22 1035)  HBsAg: NEGATIVE (12/23 1021)  HIV: NON REACTIVE (05/22 1035)  GBS: NEGATIVE (07/11  1021)   Assessment/Plan: 27 y.o. female [id number] at 53.1 EGA s/p 1 CS and 2 VBACs presenting for labor evaluation.  Consented for VBAC.  Observe d/t nonreassuring strip.  Paged Dr. Marena Chancy as requested.  Herpes and Rh- prophylaxis.  Would like epidural for pain management.  Garnette Czech 07/12/2013, 4:04 PM  FHT: 130s min varibilty to moderate variabilty, initially had variable and late decel. Improved. +accels, Toco, q35min  I spoke with and examined patient and agree with PA-S's note and plan of care.   [name] [name] is a 27 y.o. [phone] at [redacted]w[redacted]d with category two strip and Bishop score of 7 #Labor: IOL/TOLAC - Will start with pitocin 1x1 given bishop of 7 Discussed risks and benefits of TOLAC and signed consent for TOLAC. Placed in chart. DIscussed risks of uterine rupture, fetal demise compared to c-section and additional risks of TOLAC.  #Pain: desires epidural and IV pain meds #FWB: Category II continous monitroing #ID: GBS NEg, Hx of HSV - cont Acyclovir MOF: BReast MOC: undecided  Continuity patient of Dr. Calla Kicks, MD Ob Fellow 07/12/2013 4:40 PM

## 2013-07-12 NOTE — Progress Notes (Signed)
Brynlie Daza is a 27 y.o. [phone] at [redacted]w[redacted]d admitted for onset of labor  Subjective: Patient feeling pain with the contractions  Objective: BP 112/54  Pulse 61  Temp(Src) 98.7 F (37.1 C) (Oral)  Resp 18  Ht 5' (1.524 m)  Wt 101.152 kg (223 lb)  BMI 43.55 kg/m2  LMP 10/04/2012      FHT:  FHR: 125 bpm, variability: moderate,  accelerations:  Present,  decelerations:  Absent UC:   regular, every 3-4 minutes SVE:   Dilation: 4.5 Effacement (%): 70 Station: -1 Exam by:: Dr. Waynetta Sandy  Labs: Lab Results  Component Value Date   WBC 8.8 07/12/2013   HGB 11.1* 07/12/2013   HCT 32.9* 07/12/2013   MCV 92.7 07/12/2013   PLT 341 07/12/2013    Assessment / Plan: TOLAC Pit on 9  Labor: Progressing on Pitocin, will continue to increase then AROM Preeclampsia:  no signs or symptoms of toxicity Fetal Wellbeing:  Category I Pain Control:  Labor support without medications I/D:  acyclovir 400mg  tid Anticipated MOD:  NSVD  Tawni Carnes 07/12/2013, 10:29 PM

## 2013-07-12 NOTE — Progress Notes (Signed)
Report called to [facility] RN in BS. Pt to 162 via w/c

## 2013-07-12 NOTE — Progress Notes (Signed)
Dr Ike Bene notified strip still not reactive after drinking ginger ale and having some variables. FHR reassuring but not reactive. Will discuss with attending

## 2013-07-12 NOTE — Progress Notes (Signed)
Baby active 

## 2013-07-12 NOTE — MAU Note (Signed)
Contractions started around 0330, getting closer and stronger.  Lost mucous  Plug on Sunday.  Decreased fetal movement.

## 2013-07-12 NOTE — MAU Note (Signed)
Pad on bed wet and changed. Smelled like urine and had amber color

## 2013-07-12 NOTE — Progress Notes (Signed)
PT to 162 via w/c

## 2013-07-13 ENCOUNTER — Inpatient Hospital Stay (HOSPITAL_COMMUNITY): Payer: Medicaid Other | Admitting: Anesthesiology

## 2013-07-13 ENCOUNTER — Encounter (HOSPITAL_COMMUNITY): Payer: Self-pay | Admitting: *Deleted

## 2013-07-13 ENCOUNTER — Encounter (HOSPITAL_COMMUNITY): Payer: Self-pay | Admitting: Anesthesiology

## 2013-07-13 ENCOUNTER — Other Ambulatory Visit: Payer: Medicaid Other

## 2013-07-13 ENCOUNTER — Encounter: Payer: Medicaid Other | Admitting: Family Medicine

## 2013-07-13 DIAGNOSIS — O26899 Other specified pregnancy related conditions, unspecified trimester: Secondary | ICD-10-CM

## 2013-07-13 DIAGNOSIS — B009 Herpesviral infection, unspecified: Secondary | ICD-10-CM

## 2013-07-13 DIAGNOSIS — O98519 Other viral diseases complicating pregnancy, unspecified trimester: Secondary | ICD-10-CM

## 2013-07-13 MED ORDER — PRENATAL MULTIVITAMIN CH
1.0000 | ORAL_TABLET | Freq: Every day | ORAL | Status: DC
  Administered 2013-07-14 – 2013-07-15 (×2): 1 via ORAL
  Filled 2013-07-13 (×2): qty 1

## 2013-07-13 MED ORDER — DIPHENHYDRAMINE HCL 50 MG/ML IJ SOLN: 12.5000 mg | INTRAMUSCULAR | Status: DC | PRN

## 2013-07-13 MED ORDER — MEASLES, MUMPS & RUBELLA VAC ~~LOC~~ INJ
0.5000 mL | INJECTION | Freq: Once | SUBCUTANEOUS | Status: DC
  Filled 2013-07-13: qty 0.5

## 2013-07-13 MED ORDER — WITCH HAZEL-GLYCERIN EX PADS: 1.0000 "application " | MEDICATED_PAD | CUTANEOUS | Status: DC | PRN

## 2013-07-13 MED ORDER — LANOLIN HYDROUS EX OINT: TOPICAL_OINTMENT | CUTANEOUS | Status: DC | PRN

## 2013-07-13 MED ORDER — SENNOSIDES-DOCUSATE SODIUM 8.6-50 MG PO TABS
2.0000 | ORAL_TABLET | Freq: Every day | ORAL | Status: DC
  Administered 2013-07-13: 2 via ORAL

## 2013-07-13 MED ORDER — ONDANSETRON HCL 4 MG PO TABS: 4.0000 mg | ORAL_TABLET | ORAL | Status: DC | PRN

## 2013-07-13 MED ORDER — FENTANYL 2.5 MCG/ML BUPIVACAINE 1/10 % EPIDURAL INFUSION (WH - ANES)
14.0000 mL/h | INTRAMUSCULAR | Status: DC | PRN
  Filled 2013-07-13: qty 125

## 2013-07-13 MED ORDER — FENTANYL CITRATE 0.05 MG/ML IJ SOLN
100.0000 ug | INTRAMUSCULAR | Status: DC | PRN
  Administered 2013-07-13 (×3): 100 ug via INTRAVENOUS
  Filled 2013-07-13 (×3): qty 2

## 2013-07-13 MED ORDER — ONDANSETRON HCL 4 MG/2ML IJ SOLN: 4.0000 mg | INTRAMUSCULAR | Status: DC | PRN

## 2013-07-13 MED ORDER — OXYCODONE-ACETAMINOPHEN 5-325 MG PO TABS
1.0000 | ORAL_TABLET | ORAL | Status: DC | PRN
  Administered 2013-07-13: 1 via ORAL
  Administered 2013-07-13: 2 via ORAL
  Administered 2013-07-13 – 2013-07-14 (×2): 1 via ORAL
  Filled 2013-07-13 (×2): qty 1
  Filled 2013-07-13: qty 2
  Filled 2013-07-13: qty 1

## 2013-07-13 MED ORDER — TETANUS-DIPHTH-ACELL PERTUSSIS 5-2.5-18.5 LF-MCG/0.5 IM SUSP: 0.5000 mL | Freq: Once | INTRAMUSCULAR | Status: DC

## 2013-07-13 MED ORDER — LIDOCAINE HCL (PF) 1 % IJ SOLN
INTRAMUSCULAR | Status: DC | PRN
  Administered 2013-07-13 (×3): 4 mL

## 2013-07-13 MED ORDER — PHENYLEPHRINE 40 MCG/ML (10ML) SYRINGE FOR IV PUSH (FOR BLOOD PRESSURE SUPPORT)
80.0000 ug | PREFILLED_SYRINGE | INTRAVENOUS | Status: DC | PRN
  Filled 2013-07-13: qty 2

## 2013-07-13 MED ORDER — DIPHENHYDRAMINE HCL 25 MG PO CAPS: 25.0000 mg | ORAL_CAPSULE | Freq: Four times a day (QID) | ORAL | Status: DC | PRN

## 2013-07-13 MED ORDER — FENTANYL CITRATE 0.05 MG/ML IJ SOLN
INTRAMUSCULAR | Status: AC
  Administered 2013-07-13: 100 ug
  Filled 2013-07-13: qty 2

## 2013-07-13 MED ORDER — SIMETHICONE 80 MG PO CHEW: 80.0000 mg | CHEWABLE_TABLET | ORAL | Status: DC | PRN

## 2013-07-13 MED ORDER — EPHEDRINE 5 MG/ML INJ
10.0000 mg | INTRAVENOUS | Status: DC | PRN
  Filled 2013-07-13: qty 2

## 2013-07-13 MED ORDER — IBUPROFEN 600 MG PO TABS
600.0000 mg | ORAL_TABLET | Freq: Four times a day (QID) | ORAL | Status: DC
  Administered 2013-07-13 – 2013-07-15 (×8): 600 mg via ORAL
  Filled 2013-07-13 (×9): qty 1

## 2013-07-13 MED ORDER — DIBUCAINE 1 % RE OINT: 1.0000 "application " | TOPICAL_OINTMENT | RECTAL | Status: DC | PRN

## 2013-07-13 MED ORDER — ZOLPIDEM TARTRATE 5 MG PO TABS: 5.0000 mg | ORAL_TABLET | Freq: Every evening | ORAL | Status: DC | PRN

## 2013-07-13 MED ORDER — BENZOCAINE-MENTHOL 20-0.5 % EX AERO: 1.0000 "application " | INHALATION_SPRAY | CUTANEOUS | Status: DC | PRN

## 2013-07-13 MED ORDER — NALOXONE HCL 0.4 MG/ML IJ SOLN
INTRAMUSCULAR | Status: AC
  Filled 2013-07-13: qty 1

## 2013-07-13 MED ORDER — LACTATED RINGERS IV SOLN: 500.0000 mL | Freq: Once | INTRAVENOUS | Status: DC

## 2013-07-13 MED ORDER — PHENYLEPHRINE 40 MCG/ML (10ML) SYRINGE FOR IV PUSH (FOR BLOOD PRESSURE SUPPORT)
80.0000 ug | PREFILLED_SYRINGE | INTRAVENOUS | Status: DC | PRN
  Filled 2013-07-13: qty 2
  Filled 2013-07-13: qty 5

## 2013-07-13 MED ORDER — EPHEDRINE 5 MG/ML INJ
10.0000 mg | INTRAVENOUS | Status: DC | PRN
  Filled 2013-07-13: qty 2
  Filled 2013-07-13: qty 4

## 2013-07-13 NOTE — Anesthesia Procedure Notes (Signed)
Epidural Patient location during procedure: OB Start time: 07/13/2013 10:22 AM  Staffing Performed by: anesthesiologist   Preanesthetic Checklist Completed: patient identified, site marked, surgical consent, pre-op evaluation, timeout performed, IV checked, risks and benefits discussed and monitors and equipment checked  Epidural Patient position: sitting Prep: site prepped and draped and DuraPrep Patient monitoring: continuous pulse ox and blood pressure Approach: midline Injection technique: LOR air  Needle:  Needle type: Tuohy  Needle gauge: 17 G Needle length: 9 cm and 9 Needle insertion depth: 7 cm Catheter type: closed end flexible Catheter size: 19 Gauge Catheter at skin depth: 12 cm Test dose: negative  Assessment Events: blood not aspirated, injection not painful, no injection resistance, negative IV test and no paresthesia  Additional Notes Discussed risk of headache, infection, bleeding, nerve injury and failed or incomplete block.  Patient voices understanding and wishes to proceed.  Epidural placed easily on first attempt.  Left leg transient paresthesia. After test dose and loading doses were given, during taping of epidural, patient fell back into my arms and precipitously delivered the baby.  Reason for block:procedure for pain

## 2013-07-13 NOTE — Progress Notes (Signed)
[name] [name] is a 27 y.o. [phone] at [redacted]w[redacted]d admitted for onset of labor  Subjective: Patient was able to sleep some. Feeling pain and only on IV fentanyl  Objective: BP 118/64  Pulse 83  Temp(Src) 98.7 F (37.1 C) (Oral)  Resp 20  Ht 5' (1.524 m)  Wt 101.152 kg (223 lb)  BMI 43.55 kg/m2  LMP 10/04/2012      FHT:  FHR: 120 bpm, variability: moderate,  accelerations:  Present,  decelerations:  Absent UC:   irregular, every 2-5 minutes SVE:   Dilation: 5 Effacement (%): 60 Station: -2 Exam by:: ansah-mensah, rnc  Labs: Lab Results  Component Value Date   WBC 8.8 07/12/2013   HGB 11.1* 07/12/2013   HCT 32.9* 07/12/2013   MCV 92.7 07/12/2013   PLT 341 07/12/2013    Assessment / Plan: Augmentation of labor, progressing slowly Pit at 10  Labor: Progressing on Pitocin, will continue to increase then AROM Preeclampsia:  no signs or symptoms of toxicity Fetal Wellbeing:  Category I Pain Control:  Fentanyl I/D:  acyclovir Anticipated MOD:  NSVD  Tawni Carnes 07/13/2013, 2:21 AM

## 2013-07-13 NOTE — Anesthesia Preprocedure Evaluation (Signed)
Anesthesia Evaluation  Patient identified by MRN, date of birth, ID band Patient awake    Reviewed: Allergy & Precautions, H&P , NPO status , Patient's Chart, lab work & pertinent test results, reviewed documented beta blocker date and time   History of Anesthesia Complications Negative for: history of anesthetic complications  Airway Mallampati: III TM Distance: >3 FB Neck ROM: full    Dental  (+) Teeth Intact   Pulmonary neg pulmonary ROS,  breath sounds clear to auscultation        Cardiovascular negative cardio ROS  Rhythm:regular Rate:Normal     Neuro/Psych  Headaches, PSYCHIATRIC DISORDERS (depression, PTSD, ADHD) Mild mental retardation    GI/Hepatic Neg liver ROS, GERD-  Medicated,  Endo/Other  Morbid obesity  Renal/GU negative Renal ROS     Musculoskeletal   Abdominal   Peds  Hematology negative hematology ROS (+)   Anesthesia Other Findings   Reproductive/Obstetrics (+) Pregnancy (h/o c/s x1 and VBAC x2, attempting VBAC again)                           Anesthesia Physical Anesthesia Plan  ASA: III  Anesthesia Plan: Epidural   Post-op Pain Management:    Induction:   Airway Management Planned:   Additional Equipment:   Intra-op Plan:   Post-operative Plan:   Informed Consent: I have reviewed the patients History and Physical, chart, labs and discussed the procedure including the risks, benefits and alternatives for the proposed anesthesia with the patient or authorized representative who has indicated his/her understanding and acceptance.     Plan Discussed with:   Anesthesia Plan Comments:         Anesthesia Quick Evaluation

## 2013-07-13 NOTE — Progress Notes (Signed)
UR completed 

## 2013-07-14 LAB — CBC
MCV: 93.8 fL (ref 78.0–100.0)
Platelets: 252 10*3/uL (ref 150–400)
RDW: 14 % (ref 11.5–15.5)
WBC: 10 10*3/uL (ref 4.0–10.5)

## 2013-07-14 MED ORDER — RHO D IMMUNE GLOBULIN 1500 UNIT/2ML IJ SOLN
300.0000 ug | Freq: Once | INTRAMUSCULAR | Status: AC
  Administered 2013-07-14: 300 ug via INTRAMUSCULAR
  Filled 2013-07-14: qty 2

## 2013-07-14 NOTE — Anesthesia Postprocedure Evaluation (Deleted)
  Anesthesia Post-op Note  Patient: [name] [name]  Procedure(s) Performed: * No procedures listed *  Patient Location: PACU and Mother/Baby  Anesthesia Type:Epidural  Level of Consciousness: awake, alert  and oriented  Airway and Oxygen Therapy: Patient Spontanous Breathing  Post-op Pain: none  Post-op Assessment: Post-op Vital signs reviewed, Patient's Cardiovascular Status Stable, No headache, No backache, No residual numbness and No residual motor weakness  Post-op Vital Signs: Reviewed and stable  Complications: No apparent anesthesia complications 

## 2013-07-14 NOTE — Anesthesia Postprocedure Evaluation (Signed)
  Anesthesia Post-op Note  Patient: [name] [name]  Procedure(s) Performed: * No procedures listed *  Patient Location: PACU and Mother/Baby  Anesthesia Type:Epidural  Level of Consciousness: awake, alert  and oriented  Airway and Oxygen Therapy: Patient Spontanous Breathing  Post-op Pain: none  Post-op Assessment: Post-op Vital signs reviewed, Patient's Cardiovascular Status Stable, No headache, No backache, No residual numbness and No residual motor weakness  Post-op Vital Signs: Reviewed and stable  Complications: No apparent anesthesia complications

## 2013-07-14 NOTE — Progress Notes (Signed)
Post Partum Day 1 Subjective: no complaints, up ad lib, voiding, tolerating PO and + flatus Baby with jaundice requiring phototherapy Objective: Blood pressure 106/72, pulse 86, temperature 97.9 F (36.6 C), temperature source Oral, resp. rate 18, height 5' (1.524 m), weight 223 lb (101.152 kg), last menstrual period 10/04/2012, SpO2 99.00%, unknown if currently breastfeeding.  Physical Exam:  General: alert, cooperative and no distress Lochia: appropriate Uterine Fundus: firm DVT Evaluation: No evidence of DVT seen on physical exam. Negative Homan's sign.   Recent Labs  07/12/13 1545 07/14/13 0540  HGB 11.1* 9.1*  HCT 32.9* 27.0*    Assessment/Plan: - breastfeeding - social work consult to assess for stable living situation and adequate resources - birth control: undecided, patient appears resistant to birth control as she and her boyfriend want more children.  - discharge tomorrow   LOS: 2 days   Marena Chancy 07/14/2013, 8:26 AM   I examined pt and agree with documentation above and resident plan of care. [facility]

## 2013-07-15 ENCOUNTER — Other Ambulatory Visit: Payer: Medicaid Other

## 2013-07-15 LAB — RH IG WORKUP (INCLUDES ABO/RH)
Fetal Screen: NEGATIVE
Gestational Age(Wks): 40

## 2013-07-15 MED ORDER — IBUPROFEN 600 MG PO TABS: 600.0000 mg | ORAL_TABLET | Freq: Four times a day (QID) | ORAL | Status: DC

## 2013-07-15 MED ORDER — DOCUSATE SODIUM 100 MG PO CAPS: 100.0000 mg | ORAL_CAPSULE | Freq: Two times a day (BID) | ORAL | Status: DC

## 2013-07-15 NOTE — Discharge Summary (Signed)
I reviewed residents plan and discussed with patient and agree with resident's note and plan of care.  Tawana Scale, MD OB Fellow 07/15/2013 11:08 PM

## 2013-07-15 NOTE — Lactation Note (Signed)
This note was copied from the chart of [name] [name]. Lactation Consultation Note  Patient Name: [name] [name] RUEAV'W Date: 07/15/2013  Mother is holding baby, keeping him in lights and breastfeeding with cues. She denies any concerns. Bili level is decreasing slightly per RN. Encouraged and praised mother to continue to breast feed as she has been doing.    Maternal Data    Feeding Feeding Type: Breast Milk Length of feed: 20 min  LATCH Score/Interventions                      Lactation Tools Discussed/Used     Consult Status      [name] 07/15/2013, 5:39 PM

## 2013-07-15 NOTE — Discharge Summary (Signed)
Obstetric Discharge Summary  Reason for Admission: 27 y.o. female [phone] at 43.1 EGA who presented in spontaneous labor. Labor was augmented with pitocin. SROM occurred 2 hours prior to delivery and patient delivered while receiving her epidural.  Prenatal Procedures: none Intrapartum Procedures: spontaneous vaginal delivery Postpartum Procedures: none Complications-Operative and Postpartum: none Hemoglobin  Date Value Range Status  07/14/2013 9.1* 12.0 - 15.0 g/dL Final     REPEATED TO VERIFY     DELTA CHECK NOTED     HCT  Date Value Range Status  07/14/2013 27.0* 36.0 - 46.0 % Final    Physical Exam:  General: alert, cooperative and no distress Lochia: appropriate Uterine Fundus: firm DVT Evaluation: No evidence of DVT seen on physical exam. Negative Homan's sign.  Discharge Diagnoses: Term Pregnancy-delivered  Discharge Information: Date: 07/15/2013 Activity: pelvic rest Diet: routine Medications: Ibuprofen, Colace and Iron Condition: stable Instructions: refer to practice specific booklet Discharge to: home, Mom will stay with baby while he receives bili lights.    Newborn Data: Live born female  Birth Weight: 7 lb 11.5 oz (3501 g) APGAR: 8, 9  Staying in hospital for bili lights.   [name] [name] 07/15/2013, 7:42 AM

## 2013-07-15 NOTE — Discharge Summary (Deleted)
Obstetric Discharge Summary Reason for Admission: onset of labor Prenatal Procedures: NST Intrapartum Procedures: spontaneous vaginal delivery Postpartum Procedures: Rho(D) Ig  Complications-Operative and Postpartum: none Hemoglobin  Date Value Range Status  07/14/2013 9.1* 12.0 - 15.0 g/dL Final     REPEATED TO VERIFY     DELTA CHECK NOTED     HCT  Date Value Range Status  07/14/2013 27.0* 36.0 - 46.0 % Final    Physical Exam:  General: alert, cooperative and appears stated age Lochia: appropriate Uterine Fundus: firm DVT Evaluation: No evidence of DVT seen on physical exam. Negative Homan's sign. No cords or calf tenderness.  Discharge Diagnoses: Term Pregnancy-delivered  Discharge Information: Date: 07/15/2013 Activity: pelvic rest Diet: routine Medications: Ibuprofen Condition: stable Instructions: refer to practice specific booklet Discharge to: home   Newborn Data: Live born female  Birth Weight: 7 lb 11.5 oz (3501 g) APGAR: 8, 9  Mother to room in with baby.    CLARK, MICHAEL L 07/15/2013, 10:21 AM
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
## 2013-07-16 ENCOUNTER — Ambulatory Visit: Payer: Self-pay

## 2013-07-16 LAB — TYPE AND SCREEN

## 2013-07-16 NOTE — Lactation Note (Signed)
This note was copied from the chart of [name] [name]. Lactation Consultation Note: Follow up visit with mom. She had baby latched to breast when I went in, Reports that breasts are feeling fuller. Reviewed engorgement prevention and treatment. No questions at present. To call prn.  Patient Name: [name] Maelys Kinnick ZOXWR'[id number] Date: 07/16/2013 Reason for consult: Follow-up assessment   Maternal Data    Feeding Feeding Type: Breast Milk  LATCH Score/Interventions Latch: Grasps breast easily, tongue down, lips flanged, rhythmical sucking.  Audible Swallowing: A few with stimulation  Type of Nipple: Everted at rest and after stimulation  Comfort (Breast/Nipple): Soft / non-tender     Hold (Positioning): No assistance needed to correctly position infant at breast.  LATCH Score: 9  Lactation Tools Discussed/Used     Consult Status Consult Status: PRN    Pamelia Hoit 07/16/2013, 9:47 AM

## 2013-08-15 ENCOUNTER — Ambulatory Visit (INDEPENDENT_AMBULATORY_CARE_PROVIDER_SITE_OTHER): Payer: Medicaid Other | Admitting: Family Medicine

## 2013-08-15 ENCOUNTER — Encounter: Payer: Self-pay | Admitting: Family Medicine

## 2013-08-15 NOTE — Patient Instructions (Addendum)
Make an appointment with me to insert the IUD   Postpartum Care After Vaginal Delivery After you deliver your newborn (postpartum period), the usual stay in the hospital is 24 72 hours. If there were problems with your labor or delivery, or if you have other medical problems, you might be in the hospital longer.  While you are in the hospital, you will receive help and instructions on how to care for yourself and your newborn during the postpartum period.  While you are in the hospital:  Be sure to tell your nurses if you have pain or discomfort, as well as where you feel the pain and what makes the pain worse.  If you had an incision made near your vagina (episiotomy) or if you had some tearing during delivery, the nurses may put ice packs on your episiotomy or tear. The ice packs may help to reduce the pain and swelling.  If you are breastfeeding, you may feel uncomfortable contractions of your uterus for a couple of weeks. This is normal. The contractions help your uterus get back to normal size.  It is normal to have some bleeding after delivery.  For the first 1 3 days after delivery, the flow is red and the amount may be similar to a period.  It is common for the flow to start and stop.  In the first few days, you may pass some small clots. Let your nurses know if you begin to pass large clots or your flow increases.  Do not  flush blood clots down the toilet before having the nurse look at them.  During the next 3 10 days after delivery, your flow should become more watery and pink or brown-tinged in color.  Ten to fourteen days after delivery, your flow should be a small amount of yellowish-white discharge.  The amount of your flow will decrease over the first few weeks after delivery. Your flow may stop in 6 8 weeks. Most women have had their flow stop by 12 weeks after delivery.  You should change your sanitary pads frequently.  Wash your hands thoroughly with soap and water  for at least 20 seconds after changing pads, using the toilet, or before holding or feeding your newborn.  You should feel like you need to empty your bladder within the first 6 8 hours after delivery.  In case you become weak, lightheaded, or faint, call your nurse before you get out of bed for the first time and before you take a shower for the first time.  Within the first few days after delivery, your breasts may begin to feel tender and full. This is called engorgement. Breast tenderness usually goes away within 48 72 hours after engorgement occurs. You may also notice milk leaking from your breasts. If you are not breastfeeding, do not stimulate your breasts. Breast stimulation can make your breasts produce more milk.  Spending as much time as possible with your newborn is very important. During this time, you and your newborn can feel close and get to know each other. Having your newborn stay in your room (rooming in) will help to strengthen the bond with your newborn. It will give you time to get to know your newborn and become comfortable caring for your newborn.  Your hormones change after delivery. Sometimes the hormone changes can temporarily cause you to feel sad or tearful. These feelings should not last more than a few days. If these feelings last longer than that, you should talk  to your caregiver.  If desired, talk to your caregiver about methods of family planning or contraception.  Talk to your caregiver about immunizations. Your caregiver may want you to have the following immunizations before leaving the hospital:  Tetanus, diphtheria, and pertussis (Tdap) or tetanus and diphtheria (Td) immunization. It is very important that you and your family (including grandparents) or others caring for your newborn are up-to-date with the Tdap or Td immunizations. The Tdap or Td immunization can help protect your newborn from getting ill.  Rubella immunization.  Varicella (chickenpox)  immunization.  Influenza immunization. You should receive this annual immunization if you did not receive the immunization during your pregnancy. Document Released: 09/14/2007 Document Revised: 08/11/2012 Document Reviewed: 07/14/2012 [facility] Patient Information 2014 [place], [place].

## 2013-08-16 NOTE — Progress Notes (Signed)
Patient ID: [name]    DOB: 1986/11/27, 27 y.o.   MRN: [id number] --- Subjective:  Ople is a 27 y.o.female [id number] who presents for postpartum check. Patient delivered a baby boy at 40.1wga on 07/13/13. She is doing well. Breastfeeding. Some occasional lower abdominal cramping. No vaginal bleeding. Had vaginal bleeding 2 weeks after birth of baby, but this has resolved. Birth control: none for now but inquires about IUD. Voiding fine and bowel movements ok.  Phq9: 8. Bonding well with baby but sometimes feeling tired.   ROS: see HPI Past Medical History: reviewed and updated medications and allergies. Social History: Tobacco: none  Objective: Filed Vitals:   08/15/13 1036  BP: 124/85  Pulse: 85  Temp: 98.4 F (36.9 C)    Physical Examination:   General appearance - alert, well appearing, and in no distress Chest - clear to auscultation, no wheezes, rales or rhonchi, symmetric air entry Heart - normal rate, regular rhythm, normal S1, S2, no murmurs, rubs, clicks or gallops Abdomen - soft, nontender, nondistended, no masses or organomegaly Extremities - no edema Pelvic - Pelvic exam: normal external genitalia, vulva, vagina, cervix, uterus and adnexa

## 2013-08-16 NOTE — Assessment & Plan Note (Addendum)
Doing well with normal pelvic exam. - depression screen: Some fatigue with occasional sadness but no SI/HI and functioning well and taking care of her baby and other children. Monitor symptoms at next follow up for IUD placement. If needed, can consider setraline or any other SSRI in case she was ever on it in the past.   - contraception: patient interested in IUD placement. Patient to make appointment.

## 2013-09-02 ENCOUNTER — Ambulatory Visit (INDEPENDENT_AMBULATORY_CARE_PROVIDER_SITE_OTHER): Payer: Medicaid Other | Admitting: Family Medicine

## 2013-09-02 ENCOUNTER — Ambulatory Visit: Payer: Medicaid Other | Admitting: Family Medicine

## 2013-09-02 VITALS — BP 129/84 | HR 86 | Ht 62.0 in | Wt 215.0 lb

## 2013-09-02 DIAGNOSIS — Z309 Encounter for contraceptive management, unspecified: Secondary | ICD-10-CM

## 2013-09-02 DIAGNOSIS — R51 Headache: Secondary | ICD-10-CM

## 2013-09-02 DIAGNOSIS — Z975 Presence of (intrauterine) contraceptive device: Secondary | ICD-10-CM

## 2013-09-02 LAB — POCT URINE PREGNANCY: Preg Test, Ur: NEGATIVE

## 2013-09-02 MED ORDER — NAPROXEN 500 MG PO TABS: 500.0000 mg | ORAL_TABLET | Freq: Two times a day (BID) | ORAL | Status: DC

## 2013-09-02 MED ORDER — LEVONORGESTREL 20 MCG/24HR IU IUD
INTRAUTERINE_SYSTEM | Freq: Once | INTRAUTERINE | Status: AC
  Administered 2013-09-02: 1 via INTRAUTERINE

## 2013-09-02 NOTE — Patient Instructions (Addendum)
For the headache, I am sending naproxen to take twice a day. This will also help with the cramping.   Intrauterine Device Insertion Most often, an intrauterine device (IUD) is inserted into the uterus to prevent pregnancy. There are 2 types of IUDs available:  Copper IUD. This type of IUD creates an environment that is not favorable to sperm survival. The mechanism of action of the copper IUD is not known for certain. It can stay in place for 10 years.  Hormone IUD. This type of IUD contains the hormone progestin (synthetic progesterone). The progestin thickens the cervical mucus and prevents sperm from entering the uterus, and it also thins the uterine lining. There is no evidence that the hormone IUD prevents implantation. The hormone IUD can stay in place for up to 5 years. An IUD is the most cost-effective birth control if left in place for the full duration. It may be removed at any time. LET YOUR CAREGIVER KNOW ABOUT:  Sensitivity to metals.  Medicines taken including herbs, eyedrops, over-the-counter medicines, and creams.  Use of steroids (by mouth or creams).  Previous problems with anesthetics or numbing medicine.  Previous gynecological surgery.  History of blood clots or clotting disorders.  Possibility of pregnancy.  Menstrual irregularities.  Concerns regarding unusual vaginal discharge or odors.  Previous experience with an IUD.  Other health problems. RISKS AND COMPLICATIONS  Accidental puncture (perforation) of the uterus.  Accidental placement of the IUD either in the muscle layer of the uterus (myometrium) or outside the uterus. If this happen, the IUD can be found essentially floating around the bowels. When this happens, the IUD must be taken out surgically.  The IUD may fall out of the uterus (expulsion). This is more common in women who have recently had a child.   Pregnancy in the fallopian tube (ectopic). BEFORE THE PROCEDURE  Schedule the IUD  insertion for when you will have your menstrual period or right after, to make sure you are not pregnant. Placement of the IUD is better tolerated shortly after a menstrual cycle.  You may need to take tests or be examined to make sure you are not pregnant.  You may be required to take a pregnancy test.  You may be required to get checked for sexually transmitted infections (STIs) prior to placement. Placing an IUD in someone who has an infection can make an infection worse.  You may be given a pain reliever to take 1 or 2 hours before the procedure.  An exam will be performed to determine the size and position of your uterus.  Ask your caregiver about changing or stopping your regular medicines. PROCEDURE   A tool (speculum) is placed in the vagina. This allows your caregiver to see the lower part of the uterus (cervix).  The cervix is prepped with a medicine that lowers the risk of infection.  You may be given a medicine to numb each side of the cervix (intracervical or paracervical block). This is used to block and control any discomfort with insertion.  A tool (uterine sound) is inserted into the uterus to determine the length of the uterine cavity and the direction the uterus may be tilted.  A slim instrument (IUD inserter) is inserted through the cervical canal and into your uterus.  The IUD is placed in the uterine cavity and the insertion device is removed.  The nylon string that is attached to the IUD, and used for eventual IUD removal, is trimmed. It is trimmed so  that it lays high in the vagina, just outside the cervix. AFTER THE PROCEDURE  You may have bleeding after the procedure. This is normal. It varies from light spotting for a few days to menstrual-like bleeding.  You may have mild cramping.  Practice checking the string coming out of the cervix to make sure the IUD remains in the uterus. If you cannot feel the string, you should schedule a "string check" with  your caregiver.  If you had a hormone IUD inserted, expect that your period may be lighter or nonexistent within a year's time (though this is not always the case). There may be delayed fertility with the hormone IUD as a result of its progesterone effect. When you are ready to become pregnant, it is suggested to have the IUD removed up to 1 year in advance.  Yearly exams are advised. Document Released: 07/16/2011 Document Revised: 02/09/2012 Document Reviewed: 07/16/2011 [facility] Patient Information 2014 [place], [place].   Intrauterine Device Insertion Care After Refer to this sheet in the next few weeks. These instructions provide you with information on caring for yourself after your procedure. Your caregiver may also give you more specific instructions. Your treatment has been planned according to current medical practices, but problems sometimes occur. Call your caregiver if you have any problems or questions after your procedure. HOME CARE INSTRUCTIONS   Only take over-the-counter or prescription medicines for pain, discomfort, or fever as directed by your caregiver. Do not use aspirin. This may increase bleeding.  Check your IUD to make sure it is in place before you resume sexual activity. You should be able to feel the strings. If you cannot feel the strings, something may be wrong. The IUD may have fallen out of the uterus, or the uterus may have been punctured (perforated) during placement. Also, if the strings are getting longer, it may mean that the IUD is being forced out of the uterus. You no longer have full protection from pregnancy if any of these problems occur.  You may resume sexual intercourse if you are not having problems with the IUD. The IUD is considered immediately effective.  You may resume normal activities.  Keep all follow-up appointments to be sure your IUD has remained in place. After the first exam, yearly exams are advised, unless you cannot feel the strings  of your IUD.  Continue to check that the IUD is still in place by feeling for the strings after every menstrual period. SEEK MEDICAL CARE IF:   You have bleeding that is heavier or lasts longer than a normal menstrual cycle.  You have a fever.  You have increasing cramps or abdominal pain not relieved with medicine.  You have abdominal pain that does not seem to be related to the same area of earlier cramping and pain.  You are lightheaded, unusually weak, or faint.  You have abnormal vaginal discharge or smells.  You have pain during sexual intercourse.  You cannot feel the IUD strings, or the IUD string has gotten longer.  You feel the IUD at the opening of the cervix in the vagina.  You think you are pregnant, or you miss your menstrual period.  The IUD string is hurting your sex partner. Document Released: 07/16/2011 Document Revised: 02/09/2012 Document Reviewed: 07/16/2011 [facility] Patient Information 2014 [place], [place].

## 2013-09-03 DIAGNOSIS — Z975 Presence of (intrauterine) contraceptive device: Secondary | ICD-10-CM | POA: Insufficient documentation

## 2013-09-03 NOTE — Assessment & Plan Note (Signed)
BP normal making post partum pre-eclampsia less likely.  Likely tension headache. Treat with naproxen bid. If not better, return to clinic.

## 2013-09-03 NOTE — Progress Notes (Signed)
Patient ID: [name] [name]    DOB: 1985-12-19, 27 y.o.   MRN: [id number] --- Subjective:  [name] [name] is a 27 y.o.female who presents for IUD insertion.   - vaginal delivery in august, currently sexually active, no period - headache: intermittent, for the last couple of weeks, no change in vision, no photophobia or phonophobia. Tried taking ibuprofen which did not help.   ROS: see HPI Past Medical History: reviewed and updated medications and allergies. Social History: Tobacco: none  Objective: Filed Vitals:   09/02/13 1038  BP: 129/84  Pulse: 86    Physical Examination:   General appearance - alert, well appearing, and in no distress Extr - no pedal edema Pelvic - normal appearing external genitalia, vagina and cervix. Normal sized uterus.   Placement of Mirena IUD  Indication: Contraception Assisted by: Danella Sensing and Dr. Armen Pickup  Patient was counseled regarding the risks/benefits/alternatives of the IUD. Urine pregnancy test negative . Consent was obtained and all questions answered.  Time out performed.  Description: Cervix was swabbed three times with Betadine swabs. Sterile gloves donned. Sterile single-tooth tenaculum used to grasp anterior lip of the cervix and straighten the endocervical canal. Uterus sounded to 10 cm. IUD loaded per manufacturer's instruction and flange set to 8 cm. IUD placed per manufacturer's directions. Strings trimmed to 4 cm. Patient tolerated the procedure well.    Follow-up: Patient counseled regarding techniques for self-monitoring of IUD and given precautions. Patient notified of removal date and given card. Patient will follow-up in 4 weeks for string check.  Mirena serial number: LOT: [id number], EXP: 3/17

## 2013-09-03 NOTE — Assessment & Plan Note (Signed)
IUD placed without complications. Patient to follow up in 4 weeks. Red flags for return (fevers, abdominal pain, nausea, vomiting) reviewed with patient.

## 2013-09-15 ENCOUNTER — Ambulatory Visit: Payer: Medicaid Other | Admitting: Family Medicine

## 2013-09-26 ENCOUNTER — Ambulatory Visit (INDEPENDENT_AMBULATORY_CARE_PROVIDER_SITE_OTHER): Payer: Medicaid Other | Admitting: Family Medicine

## 2013-09-26 ENCOUNTER — Encounter: Payer: Self-pay | Admitting: Family Medicine

## 2013-09-26 VITALS — BP 119/57 | HR 116 | Temp 98.8°F | Ht 62.0 in | Wt 220.0 lb

## 2013-09-26 DIAGNOSIS — Z975 Presence of (intrauterine) contraceptive device: Secondary | ICD-10-CM

## 2013-09-26 DIAGNOSIS — N61 Mastitis without abscess: Secondary | ICD-10-CM | POA: Insufficient documentation

## 2013-09-26 MED ORDER — CEPHALEXIN 500 MG PO CAPS: 500.0000 mg | ORAL_CAPSULE | Freq: Three times a day (TID) | ORAL | Status: DC

## 2013-09-26 NOTE — Assessment & Plan Note (Signed)
Given warmth, tenderness and erythema, will treat with keflex for 7 days

## 2013-09-26 NOTE — Assessment & Plan Note (Signed)
Normal exam with IUD in place and strings visualized. TRimmed strings for comfort with remaining 3-4cm of strings. Spotting and irregular bleeding to be expected in first few months.  Red flags for return reviewed: abdominal pain, fever, chills, discharge

## 2013-09-26 NOTE — Patient Instructions (Signed)
You have an infection of the skin of your breast.  I will send a prescription to the pharmacy that you can pick up a little later today.   Mastitis  Mastitis is a bacterial infection of the breast tissue. CAUSES  Bacteria causes infection by entering the breast tissue through cuts or openings in the skin. Typically, this occurs with breastfeeding due to cracked or irritated skin. It can be associated with plugged ducts. Nipple piercing can also lead to mastitis. SYMPTOMS  In mastitis, an area of the breast becomes swollen, red, tender, and painful. You may notice you have a fever and swelling of the glands under your arm on that side. If the infection is allowed to progress, a collection of pus (abscess) may develop. DIAGNOSIS  Your caregiver can diagnose mastitis based on your symptoms and upon examination. The diagnosis can be confirmed if pus can be expressed from the breast. This pus can be examined in the lab to determine which bacteria are present. If an abscess has developed, the fluid in the abscess can be removed with a needle. This is used to confirm the diagnosis and determine the bacteria present. In most cases, pus will not be present. Blood tests can be done to determine if your body is fighting a bacterial infection. Sometimes, a mammogram or ultrasound will be recommended to exclude other breast diseases including cancer. Other rare forms of mastitis:  Tuberculosis mastitis is rare. The TB germ can affect the breast if it is present in some other part of the body. The breast may be slightly tender with a mass, but not tender or painful.  Syphilis of the nipple usually has an ulcer that is not tender.  Actinomycosis is a very rare bacterial infection of the breast that presents as a mass in the breast that is not tender or painful.  Phlebitis (inflammation of blood vessels) of the breast is an inflammation of the veins in the breast. It may be caused by tight fitting bras, surgery,  or trauma to the breast.  Inflammatory carcinoma of the breast looks like mastitis because the breasts are red, swollen, or tender, but it is a rare form of breast cancer. TREATMENT  Antibiotic medication is used to treat the bacterial infection. Your caregiver will determine which bacteria are most likely to be causing the infection and select an antibiotic. This is sometimes changed based on the results of cultures, or if there is no response to the antibiotic selected. Antibiotics are usually given by mouth. If you are breastfeeding, it is important to continue to empty the breast. Your caregiver can tell you whether or not this milk is safe for your infant, or needs to be thrown away. Pain can usually be treated with medication. HOME CARE INSTRUCTIONS   Take your antibiotics as directed. Finish them even if you start to feel better.  Only take over-the-counter or prescription medication for pain, discomfort, or fever as directed by your caregiver.  If breastfeeding, keep your nipples clean and dry. Your caregiver may tell you to stop nursing until he or she feels it is safe for your baby. Use a breast pump as instructed if forced to stop nursing.  Do not wear a tight bra. Wear a good support bra.  Empty the first breast completely before going to the other breast. If your baby is not emptying your breasts completely for some reason, use a breast pump to empty your breasts.  If you go back to work, pump your  breasts while at work to stay in time with your nursing schedule.  Increase your fluid intake especially if you have a fever.  Avoid having your breasts get overly filled with milk (engorged). SEEK MEDICAL CARE IF:   You develop pus-like (purulent) discharge from the breast.  Your symptoms get worse.  You do not seem to be responding to your treatment within 2 days. SEEK IMMEDIATE MEDICAL CARE IF:   You have a fever.  Your pain and swelling is getting worse.  You develop  pain that is not controlled with medicine.  You develop a red line extending from the breast toward your armpit. Document Released: 11/17/2005 Document Revised: 02/09/2012 Document Reviewed: 07/07/2008 [facility] Patient Information 2014 [place], [place].

## 2013-09-26 NOTE — Progress Notes (Signed)
Patient ID: [name] [name]    DOB: [birth year], 27 y.o.   MRN: [id number] --- Subjective:  [name] [name] is a 27 y.o.female who presents for follow up on IUD as well as complaint of right breast redness and pain.  - IUD: has had some intermittent bleeding and spotting since it was placed. No abdominal pain, no cramping, no discharge, no dyspareunia although her partner feels like he can sometimes feel something during intercourse.  - breast redness: right breast. Noticed it a day ago. She states it is hot and tender. She has been breastfeeding. No fever, no chills. She has not taken anything for it. Not improving since onset.   ROS: see HPI Past Medical History: reviewed and updated medications and allergies. Social History: Tobacco: none  Objective: Filed Vitals:   09/26/13 1102  BP: 119/57  Pulse: 116  Temp: 98.8 F (37.1 C)    Physical Examination:   General appearance - alert, well appearing, and in no distress Breast exam - right breast: no masses, no induration, no fluctuant area, tenderness with palpation diffusely, erythema and warmth noted around nipple and areola.  Pelvic exam: normal external genitalia, vulva, vagina and cervix. 4-5cm IUD strings visualized.   ~1cm IUD strings trimmed.

## 2013-10-06 ENCOUNTER — Other Ambulatory Visit: Payer: Self-pay

## 2014-03-05 ENCOUNTER — Emergency Department (HOSPITAL_COMMUNITY): Payer: Medicaid Other

## 2014-03-05 ENCOUNTER — Emergency Department (HOSPITAL_COMMUNITY)
Admission: EM | Admit: 2014-03-05 | Discharge: 2014-03-05 | Disposition: A | Discharge status: Home / Self Care | Payer: Medicaid Other | Attending: Emergency Medicine | Admitting: Emergency Medicine

## 2014-03-05 ENCOUNTER — Encounter (HOSPITAL_COMMUNITY): Payer: Self-pay | Admitting: Emergency Medicine

## 2014-03-05 DIAGNOSIS — R51 Headache: Secondary | ICD-10-CM | POA: Insufficient documentation

## 2014-03-05 DIAGNOSIS — Z8619 Personal history of other infectious and parasitic diseases: Secondary | ICD-10-CM | POA: Insufficient documentation

## 2014-03-05 DIAGNOSIS — E663 Overweight: Secondary | ICD-10-CM | POA: Insufficient documentation

## 2014-03-05 DIAGNOSIS — Z8742 Personal history of other diseases of the female genital tract: Secondary | ICD-10-CM | POA: Insufficient documentation

## 2014-03-05 DIAGNOSIS — Z87440 Personal history of urinary (tract) infections: Secondary | ICD-10-CM | POA: Insufficient documentation

## 2014-03-05 DIAGNOSIS — Z8659 Personal history of other mental and behavioral disorders: Secondary | ICD-10-CM | POA: Insufficient documentation

## 2014-03-05 DIAGNOSIS — R519 Headache, unspecified: Secondary | ICD-10-CM

## 2014-03-05 DIAGNOSIS — Z3202 Encounter for pregnancy test, result negative: Secondary | ICD-10-CM | POA: Insufficient documentation

## 2014-03-05 LAB — BASIC METABOLIC PANEL
BUN: 18 mg/dL (ref 6–23)
CALCIUM: 8.8 mg/dL (ref 8.4–10.5)
CHLORIDE: 101 meq/L (ref 96–112)
CO2: 24 meq/L (ref 19–32)
CREATININE: 0.69 mg/dL (ref 0.50–1.10)
GFR calc Af Amer: >90 mL/min (ref >90)
GFR calc non Af Amer: >90 mL/min (ref >90)
Glucose, Bld: 79 mg/dL (ref 70–99)
Potassium: 5.1 mEq/L (ref 3.7–5.3)
Sodium: 139 mEq/L (ref 137–147)

## 2014-03-05 LAB — POC URINE PREG, ED: Preg Test, Ur: NEGATIVE

## 2014-03-05 LAB — URINALYSIS, ROUTINE W REFLEX MICROSCOPIC
BILIRUBIN URINE: NEGATIVE
GLUCOSE, UA: NEGATIVE mg/dL
Hgb urine dipstick: NEGATIVE
KETONES UR: NEGATIVE mg/dL
LEUKOCYTES UA: NEGATIVE
Nitrite: NEGATIVE
PH: 6.5 (ref 5.0–8.0)
PROTEIN: NEGATIVE mg/dL
Specific Gravity, Urine: 1.025 (ref 1.005–1.030)
Urobilinogen, UA: 0.2 mg/dL (ref 0.0–1.0)

## 2014-03-05 MED ORDER — IBUPROFEN 400 MG PO TABS
400.0000 mg | ORAL_TABLET | Freq: Once | ORAL | Status: DC
  Filled 2014-03-05: qty 1

## 2014-03-05 MED ORDER — SODIUM CHLORIDE 0.9 % IV BOLUS (SEPSIS)
1000.0000 mL | Freq: Once | INTRAVENOUS | Status: AC
  Administered 2014-03-05: 1000 mL via INTRAVENOUS

## 2014-03-05 MED ORDER — DIPHENHYDRAMINE HCL 50 MG/ML IJ SOLN
25.0000 mg | Freq: Once | INTRAMUSCULAR | Status: DC
  Filled 2014-03-05: qty 1

## 2014-03-05 MED ORDER — PROCHLORPERAZINE EDISYLATE 5 MG/ML IJ SOLN
10.0000 mg | Freq: Four times a day (QID) | INTRAMUSCULAR | Status: DC | PRN
  Filled 2014-03-05: qty 2

## 2014-03-05 MED ORDER — KETOROLAC TROMETHAMINE 30 MG/ML IJ SOLN
30.0000 mg | Freq: Once | INTRAMUSCULAR | Status: AC
  Administered 2014-03-05: 30 mg via INTRAVENOUS
  Filled 2014-03-05: qty 1

## 2014-03-05 MED ORDER — IBUPROFEN 600 MG PO TABS: 600.0000 mg | ORAL_TABLET | Freq: Four times a day (QID) | ORAL | Status: DC | PRN

## 2014-03-05 MED ORDER — IBUPROFEN 400 MG PO TABS
600.0000 mg | ORAL_TABLET | Freq: Once | ORAL | Status: AC
  Administered 2014-03-05: 600 mg via ORAL
  Filled 2014-03-05 (×2): qty 1

## 2014-03-05 NOTE — ED Notes (Signed)
Pt c/o constant headache x 3 months.  Feels increase in pressure when leaning over and when she sneezes.  Friday pt began feeling pain in both eyes, esp with movement.  BP wnl.

## 2014-03-05 NOTE — ED Provider Notes (Signed)
CSN: [id number]     Arrival date & time 03/05/14  1028 History   First MD Initiated Contact with Patient 03/05/14 1032     Chief Complaint  Patient presents with  . Headache     (Consider location/radiation/quality/duration/timing/severity/associated sxs/prior Treatment) HPI  This is a 28 year old female who presents with headache. Patient reports 3 months of persistent headache.  Patient reports that the headache is worse when she leans over or sneezes. She also feels headache is worse when she moves her bilateral eyes. She denies any vision changes or photophobia. She is to use Tylenol and Aleve at home without any improvement. She has not seen her primary care physician. She currently rates her pain at 7/10. She is concerned that something "might be wrong." Patient denies any weakness, numbness, or tingling. She denies any fevers or neck pain. Patient is currently on birth control and is postpartum. She had a baby in August.  Past Medical History  Diagnosis Date  . Headache(784.0)   . Urinary tract infection   . Ovarian cyst   . Abnormal Pap smear     f/u was normal  . Infection     trich, chlamydia, gonorrhea  . Depression     doind good now   Past Surgical History  Procedure Laterality Date  . Implanon removal 08/2012  08/2012  . Cesarean section    . Tonsillectomy    . Wisdom tooth extraction     Family History  Problem Relation Age of Onset  . Other Neg Hx   . Asthma Brother   . Cancer Maternal Uncle     lung  . Diabetes Maternal Grandmother   . Cancer Maternal Grandmother     thyroid  . Hypertension Maternal Grandmother   . Hypertension Mother    History  Substance Use Topics  . Smoking status: Never Smoker   . Smokeless tobacco: Never Used     Comment: rare  . Alcohol Use: Yes     Comment: occ   OB History   Grav Para Term Preterm Abortions TAB SAB Ect Mult Living   4 4 4       4      Review of Systems  Constitutional: Negative for fever.  Eyes:  Negative for photophobia and visual disturbance.  Respiratory: Negative for cough, chest tightness and shortness of breath.   Cardiovascular: Negative for chest pain.  Gastrointestinal: Negative for nausea, vomiting and abdominal pain.  Genitourinary: Negative for dysuria.  Musculoskeletal: Negative for back pain, neck pain and neck stiffness.  Skin: Negative for wound.  Neurological: Positive for headaches. Negative for dizziness, speech difficulty, weakness and light-headedness.  Psychiatric/Behavioral: Negative for confusion.  All other systems reviewed and are negative.      Allergies  Review of patient's allergies indicates no known allergies.  Home Medications   Current Outpatient Rx  Name  Route  Sig  Dispense  Refill  . ibuprofen (ADVIL,MOTRIN) 200 MG tablet   Oral   Take 400 mg by mouth every 6 (six) hours as needed for mild pain.         Marland Kitchen ibuprofen (ADVIL,MOTRIN) 600 MG tablet   Oral   Take 1 tablet (600 mg total) by mouth every 6 (six) hours as needed.   30 tablet   0    BP 120/67  Pulse 75  Temp(Src) 98.8 F (37.1 C) (Oral)  SpO2 99% Physical Exam  Nursing note and vitals reviewed. Constitutional: She is oriented to person, place, and time.  She appears well-developed and well-nourished. No distress.  Overweight  HENT:  Head: Normocephalic and atraumatic.  Mouth/Throat: Oropharynx is clear and moist.  Eyes: EOM are normal. Pupils are equal, round, and reactive to light.  Pupils 3 mm and reactive bilaterally  Neck: Neck supple.  Cardiovascular: Normal rate, regular rhythm and normal heart sounds.   No murmur heard. Pulmonary/Chest: Effort normal and breath sounds normal. No respiratory distress. She has no wheezes.  Abdominal: Soft. Bowel sounds are normal. There is no tenderness. There is no rebound.  Neurological: She is alert and oriented to person, place, and time.  Cranial nerves II through XII intact, visual fields intact, 5 out of 5 strength in  all 4 extremities, no dysmetria to finger-nose-finger  Skin: Skin is warm and dry.  Psychiatric: She has a normal mood and affect.    ED Course  Procedures (including critical care time) Labs Review Labs Reviewed  BASIC METABOLIC PANEL  URINALYSIS, ROUTINE W REFLEX MICROSCOPIC  POC URINE PREG, ED   Imaging Review Mr Brain Wo Contrast  03/05/2014   CLINICAL DATA:  Headache for 3 months.  Seven months postpartum.  EXAM: MRI HEAD WITHOUT CONTRAST  TECHNIQUE: Multiplanar, multiecho pulse sequences of the brain and surrounding structures were obtained without intravenous contrast.  COMPARISON:  MR venography reported separately.  FINDINGS: No evidence for acute infarction, hemorrhage, mass lesion, hydrocephalus, or extra-axial fluid. There is no atrophy or white matter disease. There are no foci of chronic hemorrhage to suggest a prior venous infarct.  Increased cerebrospinal fluid the flattens the pituitary, and appears to mildly expand the sella, consistent with empty sella syndrome. Correlate clinically for idiopathic intracranial hypertension as a cause of headaches. No visible papilledema. Unremarkable cerebellar tonsils and upper cervical region. No osseous lesions. No acute sinus or mastoid disease.  IMPRESSION: No acute intracranial abnormality is evident.  Empty sella.  Question idiopathic intracranial hypertension.   Electronically Signed   By: Rolla Flatten M.D.   On: 03/05/2014 15:09   Mr Mrv Head Wo Cm  03/05/2014   CLINICAL DATA:  Headache for 3 months.  Seven months postpartum.  EXAM: MR MRV HEAD WITHOUT CONTRAST  TECHNIQUE: Multiplanar, multisequence MR imaging was performed. No intravenous contrast was administered.  COMPARISON:  Concurrent MRI brain reported separately.  FINDINGS: The superior sagittal sinus is widely patent. The left transverse sinus is the dominant drainage of the superior sagittal sinus and is widely patent. The right transverse sinus is patent, and receives primarily  the outflow of the deep venous system. Patent internal cerebral veins, vein of Galen, and straight sinus. Dominant left internal jugular. Patent right internal jugular. Patent bilateral sigmoid sinuses. No visible cortical venous thrombosis.  IMPRESSION: Negative MR venogram of the intracranial circulation.   Electronically Signed   By: Rolla Flatten M.D.   On: 03/05/2014 15:06     EKG Interpretation None      MDM   Final diagnoses:  Headache    Patient presents with headache x3 months. She is nonfocal on exam. Visual fields are intact.  Given patient's oral contraceptive use and persistent headache, concern for cerebral venous thrombosis. Patient was treated with Toradol. Limited treatment options secondary to breast-feeding. MRI/MRV is negative for acute thrombosis.  Discuss with patient the possibility of pseudotumor which is rule out LP. Given some improvement of symptoms and no vision changes, will refer to neurology. Patient was given strict return precautions. Ibuprofen as needed at home for headache.  After history, exam, and  medical workup I feel the patient has been appropriately medically screened and is safe for discharge home. Pertinent diagnoses were discussed with the patient. Patient was given return precautions.     Merryl Hacker, MD 03/05/14 [id number]

## 2014-03-05 NOTE — Discharge Instructions (Signed)
Headache  You were evaluated for headache. At this time the cause of her headache is unknown. Common causes are listed below. Other considerations include intracranial hypertension. Evaluation for that includes a lumbar puncture. Given that you have had improvement with treatment and has had no vision changes. Will refer to neurology. Treatment options are limited secondary to you breast-feeding.  See return precautions below.  CAUSES  The exact cause of a migraine headache is not always known. However, a migraine may be caused when nerves in the brain become irritated and release chemicals that cause inflammation. This causes pain. Certain things may also trigger migraines, such as:  Alcohol.  Smoking.  Stress.  Menstruation.  Aged cheeses.  Foods or drinks that contain nitrates, glutamate, aspartame, or tyramine.  Lack of sleep.  Chocolate.  Caffeine.  Hunger.  Physical exertion.  Fatigue.  Medicines used to treat chest pain (nitroglycerine), birth control pills, estrogen, and some blood pressure medicines. SIGNS AND SYMPTOMS  Pain on one or both sides of your head.  Pulsating or throbbing pain.  Severe pain that prevents daily activities.  Pain that is aggravated by any physical activity.  Nausea, vomiting, or both.  Dizziness.  Pain with exposure to bright lights, loud noises, or activity.  General sensitivity to bright lights, loud noises, or smells. Before you get a migraine, you may get warning signs that a migraine is coming (aura). An aura may include:  Seeing flashing lights.  Seeing bright spots, halos, or zig-zag lines.  Having tunnel vision or blurred vision.  Having feelings of numbness or tingling.  Having trouble talking.  Having muscle weakness. DIAGNOSIS  A migraine headache is often diagnosed based on:  Symptoms.  Physical exam.  A CT scan or MRI of your head. These imaging tests cannot diagnose migraines, but they can help rule  out other causes of headaches. TREATMENT Medicines may be given for pain and nausea. Medicines can also be given to help prevent recurrent migraines.  HOME CARE INSTRUCTIONS  Only take over-the-counter or prescription medicines for pain or discomfort as directed by your health care provider. The use of long-term narcotics is not recommended.  Lie down in a dark, quiet room when you have a migraine.  Keep a journal to find out what may trigger your migraine headaches. For example, write down:  What you eat and drink.  How much sleep you get.  Any change to your diet or medicines.  Limit alcohol consumption.  Quit smoking if you smoke.  Get 7 9 hours of sleep, or as recommended by your health care provider.  Limit stress.  Keep lights dim if bright lights bother you and make your migraines worse. SEEK IMMEDIATE MEDICAL CARE IF:   Your migraine becomes severe.  You have a fever.  You have a stiff neck.  You have vision loss.  You have muscular weakness or loss of muscle control.  You start losing your balance or have trouble walking.  You feel faint or pass out.  You have severe symptoms that are different from your first symptoms. MAKE SURE YOU:   Understand these instructions.  Will watch your condition.  Will get help right away if you are not doing well or get worse. Document Released: 11/17/2005 Document Revised: 09/07/2013 Document Reviewed: 07/25/2013 [facility] Patient Information 2014 [place].

## 2014-03-05 NOTE — ED Notes (Signed)
PT mentioned she got the IUD for the first time at her 6 week post-delivery checkup and wonders if that could be a contributing factor to her HA.

## 2014-03-05 NOTE — ED Notes (Signed)
Discussed medications with Dr. Dina Rich r/t breastfeeding PT.

## 2014-03-05 NOTE — ED Notes (Signed)
MRI

## 2014-05-05 ENCOUNTER — Telehealth: Payer: Self-pay | Admitting: *Deleted

## 2014-05-05 ENCOUNTER — Other Ambulatory Visit: Payer: Self-pay | Admitting: *Deleted

## 2014-05-05 MED ORDER — ACYCLOVIR 400 MG PO TABS: 400.0000 mg | ORAL_TABLET | Freq: Three times a day (TID) | ORAL | Status: DC

## 2014-05-05 MED ORDER — RANITIDINE HCL 150 MG PO CAPS: 150.0000 mg | ORAL_CAPSULE | Freq: Two times a day (BID) | ORAL | Status: DC

## 2014-05-05 MED ORDER — NAPROXEN 250 MG PO TABS: 250.0000 mg | ORAL_TABLET | Freq: Two times a day (BID) | ORAL | Status: DC

## 2014-05-05 NOTE — Telephone Encounter (Signed)
Will refill naproxen 250mg  bid for 10 days. If patient is having pain requiring more, she needs to be seen in the office. Please call patient to let her know.   Thank you!  Liam Graham, PGY-3 Family Medicine Resident

## 2014-05-05 NOTE — Telephone Encounter (Signed)
Received refill request via fax from [facility] for Naproxen 500 mg # 30--last refilled on 04/08/14 by Dr. Otis Dials.  Not noted on Meds & Orders.   Will route request to Dr. Otis Dials.  Burna Forts, BSN, RN-BC

## 2014-05-05 NOTE — Telephone Encounter (Signed)
Relayed message,patient voiced understanding.[place]

## 2014-06-22 ENCOUNTER — Other Ambulatory Visit: Payer: Self-pay | Admitting: *Deleted

## 2014-06-22 MED ORDER — ACYCLOVIR 400 MG PO TABS: 400.0000 mg | ORAL_TABLET | Freq: Three times a day (TID) | ORAL | Status: DC

## 2014-07-17 ENCOUNTER — Other Ambulatory Visit: Payer: Self-pay | Admitting: *Deleted

## 2014-07-17 MED ORDER — RANITIDINE HCL 150 MG PO CAPS: 150.0000 mg | ORAL_CAPSULE | Freq: Two times a day (BID) | ORAL | Status: DC

## 2014-08-10 ENCOUNTER — Other Ambulatory Visit: Payer: Self-pay | Admitting: *Deleted

## 2014-08-11 MED ORDER — ACYCLOVIR 400 MG PO TABS: 400.0000 mg | ORAL_TABLET | Freq: Three times a day (TID) | ORAL | Status: DC

## 2014-09-11 ENCOUNTER — Other Ambulatory Visit: Payer: Self-pay | Admitting: Family Medicine

## 2014-09-28 ENCOUNTER — Other Ambulatory Visit: Payer: Self-pay | Admitting: *Deleted

## 2014-09-28 ENCOUNTER — Encounter: Payer: Self-pay | Admitting: Family Medicine

## 2014-09-28 ENCOUNTER — Ambulatory Visit (INDEPENDENT_AMBULATORY_CARE_PROVIDER_SITE_OTHER): Payer: Medicaid Other | Admitting: Family Medicine

## 2014-09-28 VITALS — BP 143/99 | HR 89 | Temp 98.1°F | Ht 62.0 in | Wt 230.0 lb

## 2014-09-28 DIAGNOSIS — Z30432 Encounter for removal of intrauterine contraceptive device: Secondary | ICD-10-CM

## 2014-09-28 MED ORDER — RANITIDINE HCL 150 MG PO CAPS: 150.0000 mg | ORAL_CAPSULE | Freq: Two times a day (BID) | ORAL | Status: DC

## 2014-09-28 NOTE — Patient Instructions (Signed)
Thank you for coming in, today!  You may have some cramping after removing your IUD. You can take Aleve or ibuprofen as needed. You can get pregnant again right away, so I would recommend using condoms to prevent pregnancy. Condoms will prevent against infection as well. Birth control methods DO NOT prevent against infection.  If you decide you would like another form of birth control, let me know. Otherwise, you can follow up with me as you need.  Please feel free to call with any questions or concerns at any time, at [phone]. --Dr. Venetia Maxon

## 2014-09-28 NOTE — Progress Notes (Signed)
   Subjective:    Patient ID: [name] [name], female    DOB: 04-30-1986, 28 y.o.   MRN: [id number]  HPI: Pt presents to clinic for removal of her IUD. She states she would like it taken out for a few reasons. She no longer desires birth control, though she does not want to be pregnant or have any children, right now, but may want to in the future. The strings of the IUD are very uncomfortable for her partner. She has no significant bleeding, vaginal discharge, or vaginal pain, but does endorse some occasional abdominal cramping that feels like period pain. She is stable with one sexual partner, female, for years. She otherwise feels well.  Review of Systems: As above. Specifically denies fever, chills, N/V, LE swelling / redness / pain.     Objective:   Physical Exam BP 143/99  Pulse 89  Temp(Src) 98.1 F (36.7 C) (Oral)  Ht 5\' 2"  (1.575 m)  Wt 230 lb (104.327 kg)  BMI 42.06 kg/m2 Gen: well-appearing adult female in NAD Cardio: RRR, no murmur Pulm: CTAB, no wheezes Abd: soft, BS+, nontender, no uterine fundal tenderness or suprapubic tenderness GU: normal external vaginal / vulvar structures Speculum exam: small amount of physiologic discharge present in vaginal vault  IUD strings easily visible at cervical os prior to removal  No significant cervical bleeding or suspicious lesions noted     Assessment & Plan:  28yo female with IUD in place, desires removal. - see removal procedure note below - counseled on risks of becoming pregnant and advised condoms for contraception - instructed to f/u PRN if she desires specific contraception in the future - counseled on safe sex practices to prevent infection, otherwise  IUD Removal Procedure Note:  -Risks / benefits of removal discussed including bleeding, infection, unwanted pregnancy  -Pt wishes to proceed, consent obtained verbally -Pt placed supine and sterile speculum exam performed (see above)  -Strings easily visible at cervical os,  gripped with ring forceps, and IUD removed easily without immediate complication  -Reviewed expected symptoms (spotting, cramping) and red flags (systemic illness, N/V, fever, abdominal pain, etc)  -Recommended ibuprofen for cramping, condoms provided for birth control / STI prophylaxis  -No blood loss, procedure tolerated well, and pt to f/u as needed

## 2014-10-02 ENCOUNTER — Encounter: Payer: Self-pay | Admitting: Family Medicine

## 2014-11-04 ENCOUNTER — Inpatient Hospital Stay (HOSPITAL_COMMUNITY)
Admission: AD | Admit: 2014-11-04 | Discharge: 2014-11-04 | Disposition: A | Discharge status: Home / Self Care | Payer: Medicaid Other | Source: Ambulatory Visit | Attending: Family Medicine | Admitting: Family Medicine

## 2014-11-04 ENCOUNTER — Encounter (HOSPITAL_COMMUNITY): Payer: Self-pay | Admitting: *Deleted

## 2014-11-04 DIAGNOSIS — R110 Nausea: Secondary | ICD-10-CM | POA: Diagnosis not present

## 2014-11-04 DIAGNOSIS — R1030 Lower abdominal pain, unspecified: Secondary | ICD-10-CM | POA: Diagnosis not present

## 2014-11-04 DIAGNOSIS — Z3202 Encounter for pregnancy test, result negative: Secondary | ICD-10-CM | POA: Insufficient documentation

## 2014-11-04 LAB — WET PREP, GENITAL
Clue Cells Wet Prep HPF POC: NONE SEEN
Trich, Wet Prep: NONE SEEN
Yeast Wet Prep HPF POC: NONE SEEN

## 2014-11-04 LAB — CBC
HEMATOCRIT: 35.8 % — AB (ref 36.0–46.0)
HEMOGLOBIN: 12.2 g/dL (ref 12.0–15.0)
MCH: 32.6 pg (ref 26.0–34.0)
MCHC: 34.1 g/dL (ref 30.0–36.0)
MCV: 95.7 fL (ref 78.0–100.0)
Platelets: 395 10*3/uL (ref 150–400)
RBC: 3.74 MIL/uL — AB (ref 3.87–5.11)
RDW: 13 % (ref 11.5–15.5)
WBC: 8.8 10*3/uL (ref 4.0–10.5)

## 2014-11-04 LAB — URINALYSIS, ROUTINE W REFLEX MICROSCOPIC
BILIRUBIN URINE: NEGATIVE
GLUCOSE, UA: NEGATIVE mg/dL
Ketones, ur: 15 mg/dL — AB
Leukocytes, UA: NEGATIVE
Nitrite: NEGATIVE
PH: 5.5 (ref 5.0–8.0)
Protein, ur: NEGATIVE mg/dL
Urobilinogen, UA: 0.2 mg/dL (ref 0.0–1.0)

## 2014-11-04 LAB — URINE MICROSCOPIC-ADD ON

## 2014-11-04 LAB — POCT PREGNANCY, URINE: PREG TEST UR: NEGATIVE

## 2014-11-04 LAB — HCG, QUANTITATIVE, PREGNANCY: hCG, Beta Chain, Quant, S: <1 m[IU]/mL (ref <5)

## 2014-11-04 MED ORDER — MECLIZINE HCL 25 MG PO TABS
25.0000 mg | ORAL_TABLET | Freq: Once | ORAL | Status: AC
  Administered 2014-11-04: 25 mg via ORAL
  Filled 2014-11-04: qty 1

## 2014-11-04 NOTE — MAU Provider Note (Signed)
History     CSN: [id number]  Arrival date and time: 11/04/14 1302   None     Chief Complaint  Patient presents with  . Possible Pregnancy  . Morning Sickness   HPIpt is ?pregnant [id number] with IUD removal 09/28/2014 at [facility].  Pt had a questionable positive home pregnancy test. Pt has had lower abd cramping with nausea for about 1 week. Pt has had some pain with urination. Pt had an outside partner about 2 mos ago- had condom break. Pt is concerned about STDs. Pt has IUD removed 09/28/2014 at [facility] Rn note:  Nursing MAU Note 11/04/2014 1:43 PM    Expand All Collapse All   Pt presents to MAU with complaints of nausea, lower abdominal pain for approximately a week. Denies any vaginal bleeding. Also reports pain with urination        Past Medical History  Diagnosis Date  . Headache(784.0)   . Urinary tract infection   . Ovarian cyst   . Abnormal Pap smear     f/u was normal  . Infection     trich, chlamydia, gonorrhea  . Depression     doind good now    Past Surgical History  Procedure Laterality Date  . Implanon removal 08/2012  08/2012  . Cesarean section    . Tonsillectomy    . Wisdom tooth extraction      Family History  Problem Relation Age of Onset  . Other Neg Hx   . Asthma Brother   . Cancer Maternal Uncle     lung  . Diabetes Maternal Grandmother   . Cancer Maternal Grandmother     thyroid  . Hypertension Maternal Grandmother   . Hypertension Mother     History  Substance Use Topics  . Smoking status: Never Smoker   . Smokeless tobacco: Never Used     Comment: rare  . Alcohol Use: Yes     Comment: occ    Allergies: No Known Allergies  Prescriptions prior to admission  Medication Sig Dispense Refill Last Dose  . acyclovir (ZOVIRAX) 400 MG tablet Take one tablet by mouth three times daily 90 tablet 2   . ibuprofen (ADVIL,MOTRIN) 200 MG tablet Take 400 mg by mouth every 6 (six) hours as needed for mild pain.   03/04/2014  at Unknown time  . ibuprofen (ADVIL,MOTRIN) 600 MG tablet Take 1 tablet (600 mg total) by mouth every 6 (six) hours as needed. 30 tablet 0   . naproxen (NAPROSYN) 250 MG tablet Take 1 tablet (250 mg total) by mouth 2 (two) times daily with a meal. 20 tablet 0   . ranitidine (ZANTAC) 150 MG capsule Take 1 capsule (150 mg total) by mouth 2 (two) times daily. 60 capsule 2     Review of Systems  Constitutional: Negative for fever and chills.  Gastrointestinal: Positive for nausea and abdominal pain. Negative for vomiting, diarrhea and constipation.  Genitourinary: Positive for dysuria. Negative for urgency and frequency.   Physical Exam   There were no vitals taken for this visit.  Physical Exam  Vitals reviewed. Cardiovascular: Normal rate.   Respiratory: Effort normal.  GI: Soft. She exhibits no distension. There is no tenderness. There is no rebound.  Musculoskeletal: Normal range of motion.  Neurological: She is alert.  Skin: Skin is warm and dry.  Psychiatric: She has a normal mood and affect.    MAU Course  Procedures Results for orders placed or performed during the hospital encounter of  11/04/14 (from the past 24 hour(s))  Urinalysis, Routine w reflex microscopic     Status: Abnormal   Collection Time: 11/04/14  1:13 PM  Result Value Ref Range   Color, Urine YELLOW YELLOW   APPearance CLEAR CLEAR   Specific Gravity, Urine >1.030 (H) 1.005 - 1.030   pH 5.5 5.0 - 8.0   Glucose, UA NEGATIVE NEGATIVE mg/dL   Hgb urine dipstick TRACE (A) NEGATIVE   Bilirubin Urine NEGATIVE NEGATIVE   Ketones, ur 15 (A) NEGATIVE mg/dL   Protein, ur NEGATIVE NEGATIVE mg/dL   Urobilinogen, UA 0.2 0.0 - 1.0 mg/dL   Nitrite NEGATIVE NEGATIVE   Leukocytes, UA NEGATIVE NEGATIVE  Urine microscopic-add on     Status: Abnormal   Collection Time: 11/04/14  1:13 PM  Result Value Ref Range   Squamous Epithelial / LPF FEW (A) RARE   WBC, UA 3-6 <3 WBC/hpf   RBC / HPF 3-6 <3 RBC/hpf   Bacteria, UA  MANY (A) RARE  Pregnancy, urine POC     Status: None   Collection Time: 11/04/14  1:20 PM  Result Value Ref Range   Preg Test, Ur NEGATIVE NEGATIVE  CBC     Status: Abnormal   Collection Time: 11/04/14  2:18 PM  Result Value Ref Range   WBC 8.8 4.0 - 10.5 K/uL   RBC 3.74 (L) 3.87 - 5.11 MIL/uL   Hemoglobin 12.2 12.0 - 15.0 g/dL   HCT 35.8 (L) 36.0 - 46.0 %   MCV 95.7 78.0 - 100.0 fL   MCH 32.6 26.0 - 34.0 pg   MCHC 34.1 30.0 - 36.0 g/dL   RDW 13.0 11.5 - 15.5 %   Platelets 395 150 - 400 K/uL   Results for orders placed or performed during the hospital encounter of 11/04/14 (from the past 24 hour(s))  Urinalysis, Routine w reflex microscopic     Status: Abnormal   Collection Time: 11/04/14  1:13 PM  Result Value Ref Range   Color, Urine YELLOW YELLOW   APPearance CLEAR CLEAR   Specific Gravity, Urine >1.030 (H) 1.005 - 1.030   pH 5.5 5.0 - 8.0   Glucose, UA NEGATIVE NEGATIVE mg/dL   Hgb urine dipstick TRACE (A) NEGATIVE   Bilirubin Urine NEGATIVE NEGATIVE   Ketones, ur 15 (A) NEGATIVE mg/dL   Protein, ur NEGATIVE NEGATIVE mg/dL   Urobilinogen, UA 0.2 0.0 - 1.0 mg/dL   Nitrite NEGATIVE NEGATIVE   Leukocytes, UA NEGATIVE NEGATIVE  Urine microscopic-add on     Status: Abnormal   Collection Time: 11/04/14  1:13 PM  Result Value Ref Range   Squamous Epithelial / LPF FEW (A) RARE   WBC, UA 3-6 <3 WBC/hpf   RBC / HPF 3-6 <3 RBC/hpf   Bacteria, UA MANY (A) RARE  Pregnancy, urine POC     Status: None   Collection Time: 11/04/14  1:20 PM  Result Value Ref Range   Preg Test, Ur NEGATIVE NEGATIVE  CBC     Status: Abnormal   Collection Time: 11/04/14  2:18 PM  Result Value Ref Range   WBC 8.8 4.0 - 10.5 K/uL   RBC 3.74 (L) 3.87 - 5.11 MIL/uL   Hemoglobin 12.2 12.0 - 15.0 g/dL   HCT 35.8 (L) 36.0 - 46.0 %   MCV 95.7 78.0 - 100.0 fL   MCH 32.6 26.0 - 34.0 pg   MCHC 34.1 30.0 - 36.0 g/dL   RDW 13.0 11.5 - 15.5 %   Platelets 395 150 -  400 K/uL  hCG, quantitative, pregnancy      Status: None   Collection Time: 11/04/14  2:18 PM  Result Value Ref Range   hCG, Beta Chain, Quant, S <1 <5 mIU/mL  Wet prep, genital     Status: Abnormal   Collection Time: 11/04/14  2:28 PM  Result Value Ref Range   Yeast Wet Prep HPF POC NONE SEEN NONE SEEN   Trich, Wet Prep NONE SEEN NONE SEEN   Clue Cells Wet Prep HPF POC NONE SEEN NONE SEEN   WBC, Wet Prep HPF POC FEW (A) NONE SEEN  GC/chlamydia pending  Assessment and Plan  abd cramping Nausea- Bonine GC/chlamydia pending  [name] [name] 11/04/2014, 1:39 PM

## 2014-11-04 NOTE — MAU Note (Signed)
Pt presents to MAU with complaints of nausea, lower abdominal pain for approximately a week. Denies any vaginal bleeding. Also reports pain with urination

## 2014-11-06 LAB — GC/CHLAMYDIA PROBE AMP
CT PROBE, AMP APTIMA: NEGATIVE
GC PROBE AMP APTIMA: NEGATIVE

## 2014-11-07 ENCOUNTER — Encounter: Payer: Self-pay | Admitting: Family Medicine

## 2014-11-17 ENCOUNTER — Other Ambulatory Visit: Payer: Self-pay | Admitting: *Deleted

## 2014-11-17 MED ORDER — ACYCLOVIR 400 MG PO TABS: 400.0000 mg | ORAL_TABLET | Freq: Three times a day (TID) | ORAL | Status: DC

## 2014-12-01 NOTE — L&D Delivery Note (Cosign Needed)
Delivery Note At 7:00 PM a viable female was delivered via Vaginal, Spontaneous Delivery (Presentation:vertex ;LOA  ).  APGAR:8 ,9 ; weight  .   Placenta status:spont via shultx, .  Cord:3vc  with the following complications:none .  Cord pH: n/a  Anesthesia:   Episiotomy:   Lacerations:   Suture Repair: n/a Est. Blood Loss 50(mL):    Mom to postpartum.  Baby to Couplet care / Skin to Skin.  Koren Shiver DARLENE 11/18/2015, 7:09 PM

## 2014-12-12 ENCOUNTER — Other Ambulatory Visit: Payer: Self-pay | Admitting: *Deleted

## 2014-12-12 MED ORDER — RANITIDINE HCL 150 MG PO CAPS: 150.0000 mg | ORAL_CAPSULE | Freq: Two times a day (BID) | ORAL | Status: DC | PRN

## 2014-12-29 ENCOUNTER — Ambulatory Visit: Payer: Medicaid Other | Admitting: Family Medicine

## 2015-01-13 ENCOUNTER — Emergency Department (HOSPITAL_COMMUNITY)
Admission: EM | Admit: 2015-01-13 | Discharge: 2015-01-14 | Disposition: A | Discharge status: Home / Self Care | Payer: Medicaid Other | Attending: Emergency Medicine | Admitting: Emergency Medicine

## 2015-01-13 ENCOUNTER — Encounter (HOSPITAL_COMMUNITY): Payer: Self-pay | Admitting: Nurse Practitioner

## 2015-01-13 DIAGNOSIS — Z8742 Personal history of other diseases of the female genital tract: Secondary | ICD-10-CM | POA: Insufficient documentation

## 2015-01-13 DIAGNOSIS — Z8619 Personal history of other infectious and parasitic diseases: Secondary | ICD-10-CM | POA: Diagnosis not present

## 2015-01-13 DIAGNOSIS — R51 Headache: Secondary | ICD-10-CM | POA: Insufficient documentation

## 2015-01-13 DIAGNOSIS — Z87440 Personal history of urinary (tract) infections: Secondary | ICD-10-CM | POA: Diagnosis not present

## 2015-01-13 DIAGNOSIS — Z791 Long term (current) use of non-steroidal anti-inflammatories (NSAID): Secondary | ICD-10-CM | POA: Insufficient documentation

## 2015-01-13 DIAGNOSIS — Z3202 Encounter for pregnancy test, result negative: Secondary | ICD-10-CM | POA: Diagnosis not present

## 2015-01-13 DIAGNOSIS — Z8659 Personal history of other mental and behavioral disorders: Secondary | ICD-10-CM | POA: Insufficient documentation

## 2015-01-13 DIAGNOSIS — R519 Headache, unspecified: Secondary | ICD-10-CM

## 2015-01-13 LAB — URINALYSIS, ROUTINE W REFLEX MICROSCOPIC
Glucose, UA: NEGATIVE mg/dL
Ketones, ur: 15 mg/dL — AB
Nitrite: NEGATIVE
Protein, ur: NEGATIVE mg/dL
Specific Gravity, Urine: 1.037 — ABNORMAL HIGH (ref 1.005–1.030)
Urobilinogen, UA: 1 mg/dL (ref 0.0–1.0)
pH: 5.5 (ref 5.0–8.0)

## 2015-01-13 LAB — I-STAT CHEM 8, ED
BUN: 20 mg/dL (ref 6–23)
Calcium, Ion: 1.07 mmol/L — ABNORMAL LOW (ref 1.12–1.23)
Chloride: 103 mmol/L (ref 96–112)
Creatinine, Ser: 0.8 mg/dL (ref 0.50–1.10)
Glucose, Bld: 94 mg/dL (ref 70–99)
HCT: 41 % (ref 36.0–46.0)
Hemoglobin: 13.9 g/dL (ref 12.0–15.0)
Potassium: 3.5 mmol/L (ref 3.5–5.1)
Sodium: 143 mmol/L (ref 135–145)
TCO2: 26 mmol/L (ref 0–100)

## 2015-01-13 LAB — URINE MICROSCOPIC-ADD ON

## 2015-01-13 LAB — POC URINE PREG, ED: Preg Test, Ur: NEGATIVE

## 2015-01-13 MED ORDER — BUTALBITAL-APAP-CAFFEINE 50-325-40 MG PO TABS: 1.0000 | ORAL_TABLET | Freq: Four times a day (QID) | ORAL | Status: DC | PRN

## 2015-01-13 MED ORDER — KETOROLAC TROMETHAMINE 60 MG/2ML IM SOLN
60.0000 mg | Freq: Once | INTRAMUSCULAR | Status: AC
  Administered 2015-01-13: 60 mg via INTRAMUSCULAR
  Filled 2015-01-13: qty 2

## 2015-01-13 MED ORDER — DIPHENHYDRAMINE HCL 50 MG/ML IJ SOLN
25.0000 mg | Freq: Once | INTRAMUSCULAR | Status: AC
  Administered 2015-01-13: 25 mg via INTRAVENOUS
  Filled 2015-01-13: qty 1

## 2015-01-13 MED ORDER — SODIUM CHLORIDE 0.9 % IV BOLUS (SEPSIS): 1000.0000 mL | Freq: Once | INTRAVENOUS | Status: DC

## 2015-01-13 MED ORDER — PROCHLORPERAZINE EDISYLATE 5 MG/ML IJ SOLN
10.0000 mg | Freq: Once | INTRAMUSCULAR | Status: DC
  Filled 2015-01-13: qty 2

## 2015-01-13 MED ORDER — SODIUM CHLORIDE 0.9 % IV BOLUS (SEPSIS)
1000.0000 mL | Freq: Once | INTRAVENOUS | Status: AC
  Administered 2015-01-13: 1000 mL via INTRAVENOUS

## 2015-01-13 MED ORDER — PROCHLORPERAZINE EDISYLATE 5 MG/ML IJ SOLN
10.0000 mg | Freq: Once | INTRAMUSCULAR | Status: AC
  Administered 2015-01-13: 10 mg via INTRAVENOUS
  Filled 2015-01-13: qty 2

## 2015-01-13 NOTE — ED Provider Notes (Signed)
CSN: [id number]     Arrival date & time 01/13/15  1810 History   First MD Initiated Contact with Patient 01/13/15 1922     Chief Complaint  Patient presents with  . Headache     (Consider location/radiation/quality/duration/timing/severity/associated sxs/prior Treatment) HPI   Patient is a 29 year old female with no significant PMH who presents today complaining of a 2-3 week history of headache.  She states that a couple of weeks ago she began having a headache "all over".  She tried naproxen 250 mg which helped at first, but then the pain would come back.  Approximately 3-4x/week she describes a unilateral sharp, shooting, pulsating pain that sometimes lasts all day accompanied by dizziness and tinnitus.  No associated photophobia, N/V, visual aura, CP, or SOB.  No history of migraines.  Patient denies weakness, lightheadedness, abdominal pain, neck pain, back pain, diarrhea, chest pain, shortness of breath, cough, fever, runny nose, sore throat, rash, or syncope  Past Medical History  Diagnosis Date  . Headache(784.0)   . Urinary tract infection   . Ovarian cyst   . Abnormal Pap smear     f/u was normal  . Infection     trich, chlamydia, gonorrhea  . Depression     doind good now   Past Surgical History  Procedure Laterality Date  . Implanon removal 08/2012  08/2012  . Cesarean section    . Tonsillectomy    . Wisdom tooth extraction     Family History  Problem Relation Age of Onset  . Other Neg Hx   . Asthma Brother   . Cancer Maternal Uncle     lung  . Diabetes Maternal Grandmother   . Cancer Maternal Grandmother     thyroid  . Hypertension Maternal Grandmother   . Hypertension Mother    History  Substance Use Topics  . Smoking status: Never Smoker   . Smokeless tobacco: Never Used     Comment: rare  . Alcohol Use: Yes     Comment: occ   OB History    Gravida Para Term Preterm AB TAB SAB Ectopic Multiple Living   4 4 4       4      Review of Systems All  other systems negative except as documented in the HPI. All pertinent positives and negatives as reviewed in the HPI.  Allergies  Review of patient's allergies indicates no known allergies.  Home Medications   Prior to Admission medications   Medication Sig Start Date End Date Taking? Authorizing Provider  ibuprofen (ADVIL,MOTRIN) 200 MG tablet Take 600 mg by mouth every 6 (six) hours as needed for mild pain.    Yes Historical Provider, MD  naproxen (NAPROSYN) 250 MG tablet Take 1 tablet (250 mg total) by mouth 2 (two) times daily with a meal. Patient taking differently: Take 250 mg by mouth 2 (two) times daily as needed for mild pain.  05/05/14  Yes Kandis Nab, MD  acyclovir (ZOVIRAX) 400 MG tablet Take 1 tablet (400 mg total) by mouth 3 (three) times daily. Patient not taking: Reported on 01/13/2015 11/17/14   Sharon Mt Street, MD  ranitidine (ZANTAC) 150 MG capsule Take 1 capsule (150 mg total) by mouth 2 (two) times daily as needed for heartburn. Patient not taking: Reported on 01/13/2015 12/12/14   Sharon Mt Street, MD   BP 136/91 mmHg  Pulse 101  Temp(Src) 98 F (36.7 C) (Oral)  Ht 5\' 1"  (1.549 m)  Wt 230 lb (  104.327 kg)  BMI 43.48 kg/m2  SpO2 99%  LMP 12/11/2014 Physical Exam  Constitutional: She is oriented to person, place, and time. She appears well-developed and well-nourished. No distress.  HENT:  Head: Normocephalic and atraumatic.  Right Ear: External ear normal.  Left Ear: External ear normal.  Eyes: Conjunctivae and EOM are normal. Pupils are equal, round, and reactive to light.  Neck: Normal range of motion. Neck supple.  Cardiovascular: Normal rate, regular rhythm and normal heart sounds.   No murmur heard. Pulmonary/Chest: Effort normal and breath sounds normal. No respiratory distress. She has no wheezes. She has no rales.  Abdominal: Soft. Bowel sounds are normal. She exhibits no distension.  Neurological: She is alert and oriented to person, place,  and time. No cranial nerve deficit. She exhibits normal muscle tone. Coordination normal.  Skin: Skin is warm and dry. She is not diaphoretic.  Nursing note and vitals reviewed.   ED Course  Procedures (including critical care time) Labs Review Labs Reviewed  URINALYSIS, ROUTINE W REFLEX MICROSCOPIC - Abnormal; Notable for the following:    Specific Gravity, Urine 1.037 (*)    Hgb urine dipstick TRACE (*)    Bilirubin Urine SMALL (*)    Ketones, ur 15 (*)    Leukocytes, UA TRACE (*)    All other components within normal limits  URINE MICROSCOPIC-ADD ON - Abnormal; Notable for the following:    Squamous Epithelial / LPF FEW (*)    Bacteria, UA FEW (*)    All other components within normal limits  I-STAT CHEM 8, ED - Abnormal; Notable for the following:    Calcium, Ion 1.07 (*)    All other components within normal limits  POC URINE PREG, ED     MDM   Final diagnoses:  None   Patient presents complaining of gradual onset headache with intermittent unilateral pulsating throbbing episodes with no significant PMH.  Physical exam is unremarkable.  Labs unremarkable, UA demonstrates evidence of mild dehydration. Patient received IV fluids and pain control during ED course.  Patient did not have any imaging due to the fact that her pain has been ongoing for a month that has been consistent over that time.  I advised her to follow-up with her primary care Dr. told to return here as needed  Brent General, PA-C 01/13/15 [place], PA-C 01/13/15 [employer], MD 01/14/15 [phone]

## 2015-01-13 NOTE — ED Notes (Signed)
IV team at bedside 

## 2015-01-13 NOTE — ED Notes (Signed)
She c/o headache x 3 weeks, nausea, vomited last weekend. She tried some medication her doctor gave her for headaches with no relief. She is also worried she may be pregnant

## 2015-01-13 NOTE — Discharge Instructions (Signed)
Return here as needed.  Followup with your primary care Dr. increase your fluid intake °

## 2015-01-17 ENCOUNTER — Ambulatory Visit: Payer: Medicaid Other | Admitting: Family Medicine

## 2015-01-25 ENCOUNTER — Encounter: Payer: Self-pay | Admitting: Family Medicine

## 2015-01-25 ENCOUNTER — Other Ambulatory Visit (HOSPITAL_COMMUNITY)
Admission: RE | Admit: 2015-01-25 | Discharge: 2015-01-25 | Disposition: A | Discharge status: Home / Self Care | Payer: Medicaid Other | Source: Ambulatory Visit | Attending: Family Medicine | Admitting: Family Medicine

## 2015-01-25 ENCOUNTER — Ambulatory Visit (INDEPENDENT_AMBULATORY_CARE_PROVIDER_SITE_OTHER): Payer: Medicaid Other | Admitting: Family Medicine

## 2015-01-25 VITALS — BP 118/72 | HR 77 | Temp 99.4°F | Ht 61.0 in | Wt 235.0 lb

## 2015-01-25 DIAGNOSIS — N898 Other specified noninflammatory disorders of vagina: Secondary | ICD-10-CM

## 2015-01-25 DIAGNOSIS — B373 Candidiasis of vulva and vagina: Secondary | ICD-10-CM

## 2015-01-25 DIAGNOSIS — Z202 Contact with and (suspected) exposure to infections with a predominantly sexual mode of transmission: Secondary | ICD-10-CM

## 2015-01-25 DIAGNOSIS — Z113 Encounter for screening for infections with a predominantly sexual mode of transmission: Secondary | ICD-10-CM | POA: Diagnosis present

## 2015-01-25 DIAGNOSIS — B3731 Acute candidiasis of vulva and vagina: Secondary | ICD-10-CM

## 2015-01-25 DIAGNOSIS — N926 Irregular menstruation, unspecified: Secondary | ICD-10-CM

## 2015-01-25 LAB — POCT WET PREP (WET MOUNT)
CLUE CELLS WET PREP WHIFF POC: NEGATIVE
WBC, Wet Prep HPF POC: >20

## 2015-01-25 LAB — POCT URINE PREGNANCY: PREG TEST UR: NEGATIVE

## 2015-01-25 MED ORDER — FLUCONAZOLE 150 MG PO TABS: 150.0000 mg | ORAL_TABLET | Freq: Once | ORAL | Status: DC

## 2015-01-25 NOTE — Patient Instructions (Signed)
Thank you for coming in, today!  You do appear to have a yeast infection. Take Diflucan 150 mg (one pill) one time, today. Take a second pill in 1 week if you're still having symptoms at that point. I will call you or write you a letter with your other results. I will probably get those results early next week. Call or come back to see me as you need.  Please feel free to call with any questions or concerns at any time, at [phone]. --Dr. Venetia Maxon

## 2015-01-25 NOTE — Progress Notes (Signed)
   Subjective:    Patient ID: [name] [name], female    DOB: 1986/06/23, 29 y.o.   MRN: [id number]  HPI: Pt presents to clinic for vaginal discharge for about 2 weeks, concerned that she could be pregnant. Her discharge is sometimes clear and and sometimes thicker, white, "a lot" when she wipes. Her last intercourse was last night. She has no vaginal pain but does occasionally have some crampy abdominal pain and some vaginal irritation / itching. She has some chills but no fevers, and no change in her bowel or bladder habits. She has no urinary symptoms. Her last period was earlier this month, but was shorter than usual; her normal periods are about 5-6 days long, but her last one ended after 3 days, which happened before when she was pregnant last.  Review of Systems: As above. She also complains of some coryza-type symptoms and some sore throat for a few days.     Objective:   Physical Exam BP 118/72 mmHg  Pulse 77  Temp(Src) 99.4 F (37.4 C) (Oral)  Ht 5\' 1"  (1.549 m)  Wt 235 lb (106.595 kg)  BMI 44.43 kg/m2  LMP 01/01/2015 Gen: well-appearing adult female in NAD HEENT: [place]/AT, EOMI, MMM Pulm: normal WOB Abdominal: mild diffuse lower abdominal tenderness, BS+ GU: normal external vaginal / vulvar structures  Speculum: moderate amount thick, whitish mucoid discharge without frank bleeding or vaginal wall lesions  Bimanual exam: minimal CMT tenderness but no adnexal or fundal tenderness and no definite / discrete masses appreciated  Note cervix difficult to fully visualize on speculum exam but no definite friability noted Skin: no rashes noted  Urine pregnancy test: NEGATIVE Wet prep: positive for yeast, WBC's     Assessment & Plan:  29yo female with slightly irregular period (negative pregnancy test) and concern for sexually-transmitted infection - wet prep today shows yeast -->  Diflucan 150 mg once, repeat in 1 week if needed - GC/Chlamydia sample collected today; not due for pap  smear until August of this year - HIV and RPR blood sample also drawn, today - will f/u as needed or sooner if needed based on lab results  Emmaline Kluver, MD PGY-3, [facility] 01/25/2015, 10:54 AM

## 2015-01-26 ENCOUNTER — Telehealth: Payer: Self-pay | Admitting: Family Medicine

## 2015-01-26 LAB — RPR

## 2015-01-26 LAB — HIV ANTIBODY (ROUTINE TESTING W REFLEX): HIV 1&2 Ab, 4th Generation: NONREACTIVE

## 2015-01-26 LAB — CERVICOVAGINAL ANCILLARY ONLY
Chlamydia: NEGATIVE
NEISSERIA GONORRHEA: NEGATIVE

## 2015-01-26 NOTE — Telephone Encounter (Signed)
Patient want results of last labs taken.  Please call

## 2015-01-26 NOTE — Telephone Encounter (Signed)
Called pt back to relay negative / normal results. Pt voiced undertanding. F/u PRN.  --CMS

## 2015-01-31 ENCOUNTER — Other Ambulatory Visit: Payer: Self-pay | Admitting: *Deleted

## 2015-02-01 MED ORDER — ACYCLOVIR 400 MG PO TABS: 400.0000 mg | ORAL_TABLET | Freq: Three times a day (TID) | ORAL | Status: DC

## 2015-02-21 ENCOUNTER — Emergency Department (HOSPITAL_COMMUNITY)
Admission: EM | Admit: 2015-02-21 | Discharge: 2015-02-21 | Disposition: A | Discharge status: Home / Self Care | Payer: Medicaid Other | Attending: Emergency Medicine | Admitting: Emergency Medicine

## 2015-02-21 ENCOUNTER — Encounter (HOSPITAL_COMMUNITY): Payer: Self-pay | Admitting: Emergency Medicine

## 2015-02-21 DIAGNOSIS — Z8659 Personal history of other mental and behavioral disorders: Secondary | ICD-10-CM | POA: Insufficient documentation

## 2015-02-21 DIAGNOSIS — Z8619 Personal history of other infectious and parasitic diseases: Secondary | ICD-10-CM | POA: Diagnosis not present

## 2015-02-21 DIAGNOSIS — R51 Headache: Secondary | ICD-10-CM | POA: Diagnosis present

## 2015-02-21 DIAGNOSIS — Z8742 Personal history of other diseases of the female genital tract: Secondary | ICD-10-CM | POA: Diagnosis not present

## 2015-02-21 DIAGNOSIS — Z3202 Encounter for pregnancy test, result negative: Secondary | ICD-10-CM | POA: Diagnosis not present

## 2015-02-21 DIAGNOSIS — Z79899 Other long term (current) drug therapy: Secondary | ICD-10-CM | POA: Diagnosis not present

## 2015-02-21 DIAGNOSIS — R519 Headache, unspecified: Secondary | ICD-10-CM

## 2015-02-21 DIAGNOSIS — Z87440 Personal history of urinary (tract) infections: Secondary | ICD-10-CM | POA: Diagnosis not present

## 2015-02-21 LAB — URINALYSIS, ROUTINE W REFLEX MICROSCOPIC
Bilirubin Urine: NEGATIVE
GLUCOSE, UA: NEGATIVE mg/dL
Hgb urine dipstick: NEGATIVE
KETONES UR: NEGATIVE mg/dL
NITRITE: NEGATIVE
Protein, ur: NEGATIVE mg/dL
SPECIFIC GRAVITY, URINE: 1.026 (ref 1.005–1.030)
Urobilinogen, UA: 0.2 mg/dL (ref 0.0–1.0)
pH: 5.5 (ref 5.0–8.0)

## 2015-02-21 LAB — PREGNANCY, URINE: PREG TEST UR: NEGATIVE

## 2015-02-21 LAB — URINE MICROSCOPIC-ADD ON

## 2015-02-21 MED ORDER — KETOROLAC TROMETHAMINE 60 MG/2ML IM SOLN
60.0000 mg | Freq: Once | INTRAMUSCULAR | Status: AC
  Administered 2015-02-21: 60 mg via INTRAMUSCULAR
  Filled 2015-02-21: qty 2

## 2015-02-21 MED ORDER — SODIUM CHLORIDE 0.9 % IV BOLUS (SEPSIS): 1000.0000 mL | Freq: Once | INTRAVENOUS | Status: DC

## 2015-02-21 MED ORDER — KETOROLAC TROMETHAMINE 30 MG/ML IJ SOLN: 30.0000 mg | Freq: Once | INTRAMUSCULAR | Status: DC

## 2015-02-21 NOTE — ED Notes (Signed)
Pt states she has had a headache for 2 weeks on the right side of her head. PEARRL. Mom states when she lays diwn her head hurts more. She has a h/o seasonal allergies.

## 2015-02-21 NOTE — ED Provider Notes (Signed)
CSN: [id number]     Arrival date & time 02/21/15  1601 History  This patient was seen in room E47C/E47C and the patient's care was started at 11:36 PM.    Chief Complaint  Patient presents with  . Headache   The history is provided by the patient. No language interpreter was used.   HPI Comments: [name] [name] is a 29 y.o. female who presents to the Emergency Department complaining of right sided HA.  Patient states that headache has been occurring over one year, however over the past several months her headache has begun to occur on the right side of her head. Patient states the headache is a throbbing sensation, occurs daily, and she endorses some associated photophobia and mild nausea. Patient does endorse some mild change of her headache with position, states it is worse with lying flat. Patient denies phonophobia, vomiting, dizziness, blurred vision, weakness, fever, neck pain. Of note, patient was seen and evaluated multiple times in the ER for headache over the past year. Patient states she has not followed up with any of the outpatient resources she has been referred to. Patient was seen and evaluated for similar headache one month ago. Patient states she used Fioricet which helped her symptoms. Patient states she ran out of the prescribed medication several weeks ago, and has had persistent headaches since.   Past Medical History  Diagnosis Date  . Headache(784.0)   . Urinary tract infection   . Ovarian cyst   . Abnormal Pap smear     f/u was normal  . Infection     trich, chlamydia, gonorrhea  . Depression     doind good now   Past Surgical History  Procedure Laterality Date  . Implanon removal 08/2012  08/2012  . Cesarean section    . Tonsillectomy    . Wisdom tooth extraction     Family History  Problem Relation Age of Onset  . Other Neg Hx   . Asthma Brother   . Cancer Maternal Uncle     lung  . Diabetes Maternal Grandmother   . Cancer Maternal Grandmother      thyroid  . Hypertension Maternal Grandmother   . Hypertension Mother    History  Substance Use Topics  . Smoking status: Never Smoker   . Smokeless tobacco: Never Used     Comment: rare  . Alcohol Use: Yes     Comment: occ   OB History    Gravida Para Term Preterm AB TAB SAB Ectopic Multiple Living   4 4 4       4      Review of Systems  Constitutional: Negative for fever and chills.  Eyes: Negative for visual disturbance.  Neurological: Positive for headaches.     Allergies  Review of patient's allergies indicates no known allergies.  Home Medications   Prior to Admission medications   Medication Sig Start Date End Date Taking? Authorizing Provider  ibuprofen (ADVIL,MOTRIN) 200 MG tablet Take 600 mg by mouth every 6 (six) hours as needed for mild pain.    Yes Historical Provider, MD  acyclovir (ZOVIRAX) 400 MG tablet Take 1 tablet (400 mg total) by mouth 3 (three) times daily. Patient not taking: Reported on 02/21/2015 02/01/15   Timmothy Euler, MD  butalbital-acetaminophen-caffeine (FIORICET) [phone] MG per tablet Take 1 tablet by mouth every 6 (six) hours as needed for headache. Patient not taking: Reported on 02/21/2015 01/13/15 01/13/16  Dalia Heading, PA-C  fluconazole (DIFLUCAN) 150 MG tablet  Take 1 tablet (150 mg total) by mouth once. Repeat in 1 week if needed. Patient not taking: Reported on 02/21/2015 01/25/15   Sharon Mt Street, MD  naproxen (NAPROSYN) 250 MG tablet Take 1 tablet (250 mg total) by mouth 2 (two) times daily with a meal. Patient not taking: Reported on 02/21/2015 05/05/14   Kandis Nab, MD  ranitidine (ZANTAC) 150 MG capsule Take 1 capsule (150 mg total) by mouth 2 (two) times daily as needed for heartburn. Patient not taking: Reported on 01/13/2015 12/12/14   Sharon Mt Street, MD   Triage Vitals: BP 136/86 mmHg  Pulse 74  Temp(Src) 98.6 F (37 C) (Oral)  Resp 22  Wt 234 lb 8 oz (106.369 kg)  SpO2 99%  LMP 02/02/2015 Physical Exam   Constitutional: She is oriented to person, place, and time. She appears well-developed and well-nourished. No distress.  HENT:  Head: Normocephalic and atraumatic.  Eyes: EOM are normal.  Neck: Neck supple. No tracheal deviation present.  Cardiovascular: Normal rate.   Pulmonary/Chest: Effort normal. No respiratory distress.  Musculoskeletal: Normal range of motion.  Neurological: She is alert and oriented to person, place, and time. She has normal strength. No cranial nerve deficit or sensory deficit. She displays a negative Romberg sign. Coordination and gait normal. GCS eye subscore is 4. GCS verbal subscore is 5. GCS motor subscore is 6.  Patient fully alert, answering questions appropriately in full, clear sentences. Cranial nerves II through XII grossly intact. Motor strength 5 out of 5 in all major muscle groups of upper and lower extremities. Distal sensation intact.  Skin: Skin is warm and dry.  Psychiatric: She has a normal mood and affect. Her behavior is normal.  Nursing note and vitals reviewed.   ED Course  Procedures (including critical care time)  COORDINATION OF CARE: 11:36 PM- Discussed treatment plan with patient at bedside and patient agreed to plan.   Labs Review Labs Reviewed  URINALYSIS, ROUTINE W REFLEX MICROSCOPIC - Abnormal; Notable for the following:    Leukocytes, UA TRACE (*)    All other components within normal limits  URINE MICROSCOPIC-ADD ON - Abnormal; Notable for the following:    Squamous Epithelial / LPF FEW (*)    All other components within normal limits  PREGNANCY, URINE    Imaging Review No results found.   EKG Interpretation None      MDM   Final diagnoses:  Bad headache    Pt HA treated and improved while in ED.  Presentation is like pts typical HA and non concerning for [facility], ICH, Meningitis, or temporal arteritis. Pt is afebrile with no focal neuro deficits, nuchal rigidity, or change in vision. Pt is to follow up with PCP to  discuss prophylactic medication. Pt verbalizes understanding and is agreeable with plan to dc. Return precautions discussed with patient, and patient verbalizes understanding and agreement with this plan. I encouraged patient to call or return to ER should she have any questions or concerns.  BP 137/70 mmHg  Pulse 82  Temp(Src) 98.3 F (36.8 C) (Oral)  Resp 18  Wt 234 lb 8 oz (106.369 kg)  SpO2 100%  LMP 02/02/2015  Signed,  Dahlia Bailiff, PA-C 11:36 PM   Dahlia Bailiff, PA-C 02/21/15 [id number]  Ernestina Patches, MD 02/22/15 [phone]

## 2015-02-21 NOTE — Discharge Instructions (Signed)

## 2015-03-06 ENCOUNTER — Encounter (HOSPITAL_COMMUNITY): Payer: Self-pay | Admitting: *Deleted

## 2015-03-06 ENCOUNTER — Inpatient Hospital Stay (HOSPITAL_COMMUNITY)
Admission: AD | Admit: 2015-03-06 | Discharge: 2015-03-06 | Disposition: A | Discharge status: Home / Self Care | Payer: Medicaid Other | Source: Ambulatory Visit | Attending: Family Medicine | Admitting: Family Medicine

## 2015-03-06 ENCOUNTER — Inpatient Hospital Stay (HOSPITAL_COMMUNITY): Payer: Medicaid Other

## 2015-03-06 DIAGNOSIS — O9989 Other specified diseases and conditions complicating pregnancy, childbirth and the puerperium: Secondary | ICD-10-CM

## 2015-03-06 DIAGNOSIS — R109 Unspecified abdominal pain: Secondary | ICD-10-CM

## 2015-03-06 DIAGNOSIS — Z3A01 Less than 8 weeks gestation of pregnancy: Secondary | ICD-10-CM | POA: Diagnosis not present

## 2015-03-06 DIAGNOSIS — O26899 Other specified pregnancy related conditions, unspecified trimester: Secondary | ICD-10-CM

## 2015-03-06 LAB — URINALYSIS, ROUTINE W REFLEX MICROSCOPIC
BILIRUBIN URINE: NEGATIVE
GLUCOSE, UA: NEGATIVE mg/dL
Hgb urine dipstick: NEGATIVE
Ketones, ur: NEGATIVE mg/dL
LEUKOCYTES UA: NEGATIVE
Nitrite: NEGATIVE
PROTEIN: NEGATIVE mg/dL
Specific Gravity, Urine: 1.025 (ref 1.005–1.030)
UROBILINOGEN UA: 0.2 mg/dL (ref 0.0–1.0)
pH: 5.5 (ref 5.0–8.0)

## 2015-03-06 LAB — WET PREP, GENITAL
Clue Cells Wet Prep HPF POC: NONE SEEN
Trich, Wet Prep: NONE SEEN
YEAST WET PREP: NONE SEEN

## 2015-03-06 LAB — HCG, QUANTITATIVE, PREGNANCY: hCG, Beta Chain, Quant, S: 706 m[IU]/mL — ABNORMAL HIGH (ref <5)

## 2015-03-06 LAB — POCT PREGNANCY, URINE: PREG TEST UR: POSITIVE — AB

## 2015-03-06 NOTE — MAU Note (Signed)
Having period-like cramping for 2-3 weeks.

## 2015-03-06 NOTE — MAU Provider Note (Signed)
Chief Complaint: Abdominal Pain and Vaginal Itching   First Provider Initiated Contact with Patient 03/06/15 1943     SUBJECTIVE HPI: [name] [name] is a 29 y.o. [id number] at [redacted]w[redacted]d by LMP who presents to [facility]missions reporting Intermittent crampy lower abdominal pain for 2-3 weeks. Pain does not radiate. No self treatment. She also has some external vulvar itching. Denies irritative vaginal discharge. Head negative UPT 02/21/2015. Does have subjective symptoms of pregnancy. She is still breast-feeding her 70-year-old. Plans prenatal care at [place].   Past Medical History  Diagnosis Date  . Headache(784.0)   . Urinary tract infection   . Ovarian cyst   . Abnormal Pap smear     f/u was normal  . Infection     trich, chlamydia, gonorrhea  . Depression     doind good now   OB History  Gravida Para Term Preterm AB SAB TAB Ectopic Multiple Living  5 4 4       4     # Outcome Date GA Lbr Len/2nd Weight Sex Delivery Anes PTL Lv  5 Current           4 Term 07/13/13 [redacted]w[redacted]d 02:11 7 lb 11.5 oz (3.501 kg) M VBAC EPI  Y     Comments: extra digits on hands bilaterally  3 Term 11/22/10 [redacted]w[redacted]d  7 lb 5 oz (3.317 kg) F VBAC  N Y     Comments: vbac  2 Term 09/23/09 [redacted]w[redacted]d  7 lb 11 oz (3.487 kg) M VBAC  N Y     Comments: VBAC  1 Term 10/27/06 [redacted]w[redacted]d  7 lb 9 oz (3.43 kg) F CS-LTranv  N N     Comments: fetal decels     Past Surgical History  Procedure Laterality Date  . Implanon removal 08/2012  08/2012  . Cesarean section    . Tonsillectomy    . Wisdom tooth extraction     History   Social History  . Marital Status: Single    Spouse Name: N/A  . Number of Children: N/A  . Years of Education: N/A   Occupational History  . Not on file.   Social History Main Topics  . Smoking status: Never Smoker   . Smokeless tobacco: Never Used     Comment: rare  . Alcohol Use: Yes     Comment: occ when pregnant  . Drug Use: No  . Sexual Activity: Yes    Birth Control/ Protection: None    Other Topics Concern  . Not on file   Social History Narrative   No current facility-administered medications on file prior to encounter.   No current outpatient prescriptions on file prior to encounter.   No Known Allergies  Review of Systems  Constitutional: Positive for fever.  Respiratory: Negative for cough.   Gastrointestinal: Positive for nausea. Negative for vomiting and diarrhea.  Genitourinary: Positive for frequency. Negative for dysuria, urgency, hematuria and flank pain.  Neurological: Negative for dizziness and headaches.    OBJECTIVE Blood pressure 132/87, pulse 108, temperature 99 F (37.2 C), temperature source Oral, resp. rate 18, last menstrual period 02/01/2015. GENERAL:Obese female in no acute distress.  HEART: normal rate RESP: normal effort GI: Abdomen soft, non-tender. Positive bowel sounds 4. MS: Nontender, no edema NEURO: Alert and oriented SPECULUM EXAM: NEFG, mucusy discharge, no blood noted, cervix clean BIMANUAL: cervix L/C; uterus ULNS; no adnexal tenderness or masses  LAB RESULTS Results for orders placed or performed during the hospital encounter of 03/06/15 (  from the past 24 hour(s))  Urinalysis, Routine w reflex microscopic     Status: None   Collection Time: 03/06/15  3:42 PM  Result Value Ref Range   Color, Urine YELLOW YELLOW   APPearance CLEAR CLEAR   Specific Gravity, Urine 1.025 1.005 - 1.030   pH 5.5 5.0 - 8.0   Glucose, UA NEGATIVE NEGATIVE mg/dL   Hgb urine dipstick NEGATIVE NEGATIVE   Bilirubin Urine NEGATIVE NEGATIVE   Ketones, ur NEGATIVE NEGATIVE mg/dL   Protein, ur NEGATIVE NEGATIVE mg/dL   Urobilinogen, UA 0.2 0.0 - 1.0 mg/dL   Nitrite NEGATIVE NEGATIVE   Leukocytes, UA NEGATIVE NEGATIVE  Pregnancy, urine POC     Status: Abnormal   Collection Time: 03/06/15  4:02 PM  Result Value Ref Range   Preg Test, Ur POSITIVE (A) NEGATIVE  Wet prep, genital     Status: Abnormal   Collection Time: 03/06/15  8:04 PM   Result Value Ref Range   Yeast Wet Prep HPF POC NONE SEEN NONE SEEN   Trich, Wet Prep NONE SEEN NONE SEEN   Clue Cells Wet Prep HPF POC NONE SEEN NONE SEEN   WBC, Wet Prep HPF POC FEW (A) NONE SEEN  hCG, quantitative, pregnancy     Status: Abnormal   Collection Time: 03/06/15  8:13 PM  Result Value Ref Range   hCG, Beta Chain, Quant, S 706 (H) <5 mIU/mL    IMAGING US Ob Comp Less 14 Wks  03/06/2015   CLINICAL DATA:  Intermittent abdominal pain for the past 2-3 weeks. Four weeks 5 days pregnant by last menstrual period. Quantitative beta HCG 706.  EXAM: OBSTETRIC <14 WK [place] AND TRANSVAGINAL OB US  TECHNIQUE: Both transabdominal and transvaginal ultrasound examinations were performed for complete evaluation of the gestation as well as the maternal uterus, adnexal regions, and pelvic cul-de-sac. Transvaginal technique was performed to assess early pregnancy.  COMPARISON:  Previous examinations, the most recent dated 03/29/2013.  FINDINGS: Intrauterine gestational sac: Possible tiny developing gestational sac within the endometrium with a mean diameter of 2.2 mm.  Yolk sac:  Not visualized  Embryo:  Not visualized  Cardiac Activity: Not applicable  MSD: 2.2  mm   4 w   5  d               [place] EDC: 11/08/2015  Maternal uterus/adnexae: Probable right ovarian corpus luteum. Normal appearing left ovary. No free peritoneal fluid or subchorionic hemorrhage seen.  IMPRESSION: Possible tiny mid developing gestational sac within the endometrium with a mean sac diameter 2.2 mm giving an estimated gestational age of [redacted] weeks and 5 days which correlates with the estimated gestational age by last menstrual period. No yolk sac, fetal pole, or cardiac activity yet visualized. Recommend follow-up quantitative B-HCG levels and follow-up US in 14 days to confirm and assess viability. This recommendation follows SRU consensus guidelines: Diagnostic Criteria for Nonviable Pregnancy Early in the First Trimester. [employer] [facility] 2013;  063:0160-10.   Electronically Signed   By: Claudie Revering M.D.   On: 03/06/2015 21:59   US Ob Transvaginal  03/06/2015   CLINICAL DATA:  Intermittent abdominal pain for the past 2-3 weeks. Four weeks 5 days pregnant by last menstrual period. Quantitative beta HCG 706.  EXAM: OBSTETRIC <14 WK [place] AND TRANSVAGINAL OB US  TECHNIQUE: Both transabdominal and transvaginal ultrasound examinations were performed for complete evaluation of the gestation as well as the maternal uterus, adnexal regions, and pelvic cul-de-sac. Transvaginal technique  was performed to assess early pregnancy.  COMPARISON:  Previous examinations, the most recent dated 03/29/2013.  FINDINGS: Intrauterine gestational sac: Possible tiny developing gestational sac within the endometrium with a mean diameter of 2.2 mm.  Yolk sac:  Not visualized  Embryo:  Not visualized  Cardiac Activity: Not applicable  MSD: 2.2  mm   4 w   5  d               [place] EDC: 11/08/2015  Maternal uterus/adnexae: Probable right ovarian corpus luteum. Normal appearing left ovary. No free peritoneal fluid or subchorionic hemorrhage seen.  IMPRESSION: Possible tiny mid developing gestational sac within the endometrium with a mean sac diameter 2.2 mm giving an estimated gestational age of [redacted] weeks and 5 days which correlates with the estimated gestational age by last menstrual period. No yolk sac, fetal pole, or cardiac activity yet visualized. Recommend follow-up quantitative B-HCG levels and follow-up US in 14 days to confirm and assess viability. This recommendation follows SRU consensus guidelines: Diagnostic Criteria for Nonviable Pregnancy Early in the First Trimester. [employer] [facility] 2013; 462:7035-00.   Electronically Signed   By: Claudie Revering M.D.   On: 03/06/2015 21:59    MAU COURSE GC/CT, HIV sent Care assumed by Kerry Hough PA at 2100  ASSESSMENT 1. Abdominal pain during pregnancy, antepartum     PLAN  Follow-up Information    Follow up with [facility] In 2 days.   Why:  03/08/15 in the evening for follow-up blood work or sooner if symptoms worsen   Contact information:   [address] [id number] [place], CNM 03/06/2015  7:56 PM  Results for orders placed or performed during the hospital encounter of 03/06/15 (from the past 24 hour(s))  Urinalysis, Routine w reflex microscopic     Status: None   Collection Time: 03/06/15  3:42 PM  Result Value Ref Range   Color, Urine YELLOW YELLOW   APPearance CLEAR CLEAR   Specific Gravity, Urine 1.025 1.005 - 1.030   pH 5.5 5.0 - 8.0   Glucose, UA NEGATIVE NEGATIVE mg/dL   Hgb urine dipstick NEGATIVE NEGATIVE   Bilirubin Urine NEGATIVE NEGATIVE   Ketones, ur NEGATIVE NEGATIVE mg/dL   Protein, ur NEGATIVE NEGATIVE mg/dL   Urobilinogen, UA 0.2 0.0 - 1.0 mg/dL   Nitrite NEGATIVE NEGATIVE   Leukocytes, UA NEGATIVE NEGATIVE  Pregnancy, urine POC     Status: Abnormal   Collection Time: 03/06/15  4:02 PM  Result Value Ref Range   Preg Test, Ur POSITIVE (A) NEGATIVE  Wet prep, genital     Status: Abnormal   Collection Time: 03/06/15  8:04 PM  Result Value Ref Range   Yeast Wet Prep HPF POC NONE SEEN NONE SEEN   Trich, Wet Prep NONE SEEN NONE SEEN   Clue Cells Wet Prep HPF POC NONE SEEN NONE SEEN   WBC, Wet Prep HPF POC FEW (A) NONE SEEN  hCG, quantitative, pregnancy     Status: Abnormal   Collection Time: 03/06/15  8:13 PM  Result Value Ref Range   hCG, Beta Chain, Quant, S 706 (H) <5 mIU/mL    US Ob Comp Less 14 Wks  03/06/2015   CLINICAL DATA:  Intermittent abdominal pain for the past 2-3 weeks. Four weeks 5 days pregnant by last menstrual period. Quantitative beta HCG 706.  EXAM: OBSTETRIC <14  WK [place] AND TRANSVAGINAL OB US  TECHNIQUE: Both transabdominal and transvaginal ultrasound examinations were performed for complete evaluation of the gestation as well as the maternal  uterus, adnexal regions, and pelvic cul-de-sac. Transvaginal technique was performed to assess early pregnancy.  COMPARISON:  Previous examinations, the most recent dated 03/29/2013.  FINDINGS: Intrauterine gestational sac: Possible tiny developing gestational sac within the endometrium with a mean diameter of 2.2 mm.  Yolk sac:  Not visualized  Embryo:  Not visualized  Cardiac Activity: Not applicable  MSD: 2.2  mm   4 w   5  d               [place] EDC: 11/08/2015  Maternal uterus/adnexae: Probable right ovarian corpus luteum. Normal appearing left ovary. No free peritoneal fluid or subchorionic hemorrhage seen.  IMPRESSION: Possible tiny mid developing gestational sac within the endometrium with a mean sac diameter 2.2 mm giving an estimated gestational age of [redacted] weeks and 5 days which correlates with the estimated gestational age by last menstrual period. No yolk sac, fetal pole, or cardiac activity yet visualized. Recommend follow-up quantitative B-HCG levels and follow-up US in 14 days to confirm and assess viability. This recommendation follows SRU consensus guidelines: Diagnostic Criteria for Nonviable Pregnancy Early in the First Trimester. [employer] [facility] 2013; 431:5400-86.   Electronically Signed   By: Claudie Revering M.D.   On: 03/06/2015 21:59   US Ob Transvaginal  03/06/2015   CLINICAL DATA:  Intermittent abdominal pain for the past 2-3 weeks. Four weeks 5 days pregnant by last menstrual period. Quantitative beta HCG 706.  EXAM: OBSTETRIC <14 WK [place] AND TRANSVAGINAL OB US  TECHNIQUE: Both transabdominal and transvaginal ultrasound examinations were performed for complete evaluation of the gestation as well as the maternal uterus, adnexal regions, and pelvic cul-de-sac. Transvaginal technique was performed to assess early pregnancy.  COMPARISON:  Previous examinations, the most recent dated 03/29/2013.  FINDINGS: Intrauterine gestational sac: Possible tiny developing gestational sac within the endometrium with a  mean diameter of 2.2 mm.  Yolk sac:  Not visualized  Embryo:  Not visualized  Cardiac Activity: Not applicable  MSD: 2.2  mm   4 w   5  d               [place] EDC: 11/08/2015  Maternal uterus/adnexae: Probable right ovarian corpus luteum. Normal appearing left ovary. No free peritoneal fluid or subchorionic hemorrhage seen.  IMPRESSION: Possible tiny mid developing gestational sac within the endometrium with a mean sac diameter 2.2 mm giving an estimated gestational age of [redacted] weeks and 5 days which correlates with the estimated gestational age by last menstrual period. No yolk sac, fetal pole, or cardiac activity yet visualized. Recommend follow-up quantitative B-HCG levels and follow-up US in 14 days to confirm and assess viability. This recommendation follows SRU consensus guidelines: Diagnostic Criteria for Nonviable Pregnancy Early in the First Trimester. [employer] [facility] 2013; 761:9509-32.   Electronically Signed   By: Claudie Revering M.D.   On: 03/06/2015 21:59   MDM Patient appears comfortable at time of discharge. It appears patient's main concern is determining date of conception.   A: Possible IUGS without YS or FP at [redacted]w[redacted]d Abdominal pain in pregnancy  P: Discharge home Patient to return to MAU for follow-up hCG the evening of 03/08/15 Ectopic precautions discussed Patient may return to MAU as needed or if her condition were to change or worsen sooner  Luvenia Redden, PA-C 03/06/2015  11:27 PM

## 2015-03-06 NOTE — Discharge Instructions (Signed)
First Trimester of Pregnancy The first trimester of pregnancy is from week 1 until the end of week 12 (months 1 through 3). During this time, your baby will begin to develop inside you. At 6-8 weeks, the eyes and face are formed, and the heartbeat can be seen on ultrasound. At the end of 12 weeks, all the baby's organs are formed. Prenatal care is all the medical care you receive before the birth of your baby. Make sure you get good prenatal care and follow all of your doctor's instructions. HOME CARE  Medicines  Take medicine only as told by your doctor. Some medicines are safe and some are not during pregnancy.  Take your prenatal vitamins as told by your doctor.  Take medicine that helps you poop (stool softener) as needed if your doctor says it is okay. Diet  Eat regular, healthy meals.  Your doctor will tell you the amount of weight gain that is right for you.  Avoid raw meat and uncooked cheese.  If you feel sick to your stomach (nauseous) or throw up (vomit):  Eat 4 or 5 small meals a day instead of 3 large meals.  Try eating a few soda crackers.  Drink liquids between meals instead of during meals.  If you have a hard time pooping (constipation):  Eat high-fiber foods like fresh vegetables, fruit, and whole grains.  Drink enough fluids to keep your pee (urine) clear or pale yellow. Activity and Exercise  Exercise only as told by your doctor. Stop exercising if you have cramps or pain in your lower belly (abdomen) or low back.  Try to avoid standing for long periods of time. Move your legs often if you must stand in one place for a long time.  Avoid heavy lifting.  Wear low-heeled shoes. Sit and stand up straight.  You can have sex unless your doctor tells you not to. Relief of Pain or Discomfort  Wear a good support bra if your breasts are sore.  Take warm water baths (sitz baths) to soothe pain or discomfort caused by hemorrhoids. Use hemorrhoid cream if your  doctor says it is okay.  Rest with your legs raised if you have leg cramps or low back pain.  Wear support hose if you have puffy, bulging veins (varicose veins) in your legs. Raise (elevate) your feet for 15 minutes, 3-4 times a day. Limit salt in your diet. Prenatal Care  Schedule your prenatal visits by the twelfth week of pregnancy.  Write down your questions. Take them to your prenatal visits.  Keep all your prenatal visits as told by your doctor. Safety  Wear your seat belt at all times when driving.  Make a list of emergency phone numbers. The list should include numbers for family, friends, the hospital, and police and fire departments. General Tips  Ask your doctor for a referral to a local prenatal class. Begin classes no later than at the start of month 6 of your pregnancy.  Ask for help if you need counseling or help with nutrition. Your doctor can give you advice or tell you where to go for help.  Do not use hot tubs, steam rooms, or saunas.  Do not douche or use tampons or scented sanitary pads.  Do not cross your legs for long periods of time.  Avoid litter boxes and soil used by cats.  Avoid all smoking, herbs, and alcohol. Avoid drugs not approved by your doctor.  Visit your dentist. At home, brush your teeth   with a soft toothbrush. Be gentle when you floss. GET HELP IF:  You are dizzy.  You have mild cramps or pressure in your lower belly.  You have a nagging pain in your belly area.  You continue to feel sick to your stomach, throw up, or have watery poop (diarrhea).  You have a bad smelling fluid coming from your vagina.  You have pain with peeing (urination).  You have increased puffiness (swelling) in your face, hands, legs, or ankles. GET HELP RIGHT AWAY IF:   You have a fever.  You are leaking fluid from your vagina.  You have spotting or bleeding from your vagina.  You have very bad belly cramping or pain.  You gain or lose weight  rapidly.  You throw up blood. It may look like coffee grounds.  You are around people who have German measles, fifth disease, or chickenpox.  You have a very bad headache.  You have shortness of breath.  You have any kind of trauma, such as from a fall or a car accident. Document Released: 05/05/2008 Document Revised: 04/03/2014 Document Reviewed: 09/27/2013 ExitCare Patient Information 2015 [employer]. This information is not intended to replace advice given to you by your health care provider. Make sure you discuss any questions you have with your health care provider.  

## 2015-03-06 NOTE — MAU Note (Signed)
Lower abd pain x 1 1/2 weeks, vaginal itching, no discharge but has some odor.  Denies bleeding.  Late for period, LMP 3/3.

## 2015-03-07 LAB — HIV ANTIBODY (ROUTINE TESTING W REFLEX): HIV Screen 4th Generation wRfx: NONREACTIVE

## 2015-03-07 LAB — GC/CHLAMYDIA PROBE AMP (~~LOC~~) NOT AT ARMC
CHLAMYDIA, DNA PROBE: NEGATIVE
Neisseria Gonorrhea: NEGATIVE

## 2015-03-08 ENCOUNTER — Inpatient Hospital Stay (HOSPITAL_COMMUNITY)
Admission: AD | Admit: 2015-03-08 | Discharge: 2015-03-08 | Disposition: A | Discharge status: Home / Self Care | Payer: Medicaid Other | Source: Ambulatory Visit | Attending: Obstetrics & Gynecology | Admitting: Obstetrics & Gynecology

## 2015-03-08 DIAGNOSIS — R51 Headache: Secondary | ICD-10-CM | POA: Diagnosis not present

## 2015-03-08 DIAGNOSIS — R109 Unspecified abdominal pain: Secondary | ICD-10-CM | POA: Diagnosis not present

## 2015-03-08 DIAGNOSIS — Z3A01 Less than 8 weeks gestation of pregnancy: Secondary | ICD-10-CM | POA: Diagnosis not present

## 2015-03-08 DIAGNOSIS — O9989 Other specified diseases and conditions complicating pregnancy, childbirth and the puerperium: Secondary | ICD-10-CM | POA: Insufficient documentation

## 2015-03-08 DIAGNOSIS — O210 Mild hyperemesis gravidarum: Secondary | ICD-10-CM | POA: Insufficient documentation

## 2015-03-08 LAB — HCG, QUANTITATIVE, PREGNANCY: hCG, Beta Chain, Quant, S: 2121 m[IU]/mL — ABNORMAL HIGH (ref <5)

## 2015-03-08 MED ORDER — PROMETHAZINE HCL 25 MG PO TABS: 25.0000 mg | ORAL_TABLET | Freq: Four times a day (QID) | ORAL | Status: DC | PRN

## 2015-03-08 MED ORDER — METOCLOPRAMIDE HCL 10 MG PO TABS: 10.0000 mg | ORAL_TABLET | Freq: Four times a day (QID) | ORAL | Status: DC

## 2015-03-08 MED ORDER — BUTALBITAL-APAP-CAFFEINE 50-325-40 MG PO TABS: 1.0000 | ORAL_TABLET | Freq: Four times a day (QID) | ORAL | Status: DC | PRN

## 2015-03-08 NOTE — MAU Note (Signed)
Pt reports lower abd discomfort off/on. Denies bleeding.

## 2015-03-08 NOTE — MAU Provider Note (Signed)
FResults for [name] [name], [name] [name] (MRN [id number]) as of 03/08/2015 19:44  Ref. Range 03/06/2015 20:13 03/06/2015 21:52 03/08/2015 17:55  hCG, Beta Chain, Quant, S Latest Range: <5 mIU/mL 706 (H)  2121 (H)  29 yo G5P4 at [redacted] weeks pregnant in NAD Pt presents for f/u HCG.  Pt was seen on 03/06/2015 with intermittent cramping/lower abdominal pain for 2 to 3 weeks. Pt denies bleeding but still has some cramping on and off. Pt denies constipation or UTI sx- has had some loose stools Pt is still breast feeding her 27 year old at night and has cramping when she breast feeds. Pt also  Has hx of migraines and says she cannot take her migraine medicine- she had a migraine yesterday Not today Pt plans prenatal care at [facility]- abdominal pain in early pregnancy                      Appropriate rise in HCG( 706 to 2121)                      Headaches                       Morning sickness Plan- f/u with ultrasound for viability on Monday April 18 Return sooner if increase in pain or bleeding          Confirmation of pregnancy letter given to pt           Printed information given on morning sickness with Rx for Reglan and Phenergan           Printed information on breast feeding in pregnancy            West Pugh, NP

## 2015-03-12 ENCOUNTER — Ambulatory Visit: Payer: Medicaid Other | Admitting: Family Medicine

## 2015-03-15 ENCOUNTER — Other Ambulatory Visit: Payer: Self-pay | Admitting: *Deleted

## 2015-03-15 MED ORDER — RANITIDINE HCL 150 MG PO TABS: 150.0000 mg | ORAL_TABLET | Freq: Two times a day (BID) | ORAL | Status: DC

## 2015-03-19 ENCOUNTER — Ambulatory Visit (HOSPITAL_COMMUNITY)
Admission: RE | Admit: 2015-03-19 | Discharge: 2015-03-19 | Disposition: A | Discharge status: Home / Self Care | Payer: Medicaid Other | Source: Ambulatory Visit | Attending: Gynecology | Admitting: Gynecology

## 2015-03-19 ENCOUNTER — Inpatient Hospital Stay (HOSPITAL_COMMUNITY)
Admission: AD | Admit: 2015-03-19 | Discharge: 2015-03-19 | Disposition: A | Discharge status: Home / Self Care | Payer: Medicaid Other | Source: Ambulatory Visit | Attending: Obstetrics and Gynecology | Admitting: Obstetrics and Gynecology

## 2015-03-19 DIAGNOSIS — Z36 Encounter for antenatal screening of mother: Secondary | ICD-10-CM | POA: Insufficient documentation

## 2015-03-19 DIAGNOSIS — O9989 Other specified diseases and conditions complicating pregnancy, childbirth and the puerperium: Secondary | ICD-10-CM | POA: Diagnosis not present

## 2015-03-19 DIAGNOSIS — Z3A01 Less than 8 weeks gestation of pregnancy: Secondary | ICD-10-CM | POA: Diagnosis not present

## 2015-03-19 DIAGNOSIS — R109 Unspecified abdominal pain: Secondary | ICD-10-CM

## 2015-03-19 DIAGNOSIS — O26899 Other specified pregnancy related conditions, unspecified trimester: Secondary | ICD-10-CM

## 2015-03-19 NOTE — MAU Provider Note (Signed)
This is a 29 y.o. female G5P4 at [redacted]w[redacted]d who presents for notification of her ultrasound results.  She was last seen on 03/08/15 for followup of abdominal pain in early pregnancy.   HPI:  Has had no further bleeding or pain ROS:  No apparent distress, No chest pain or difficulty breathing. No abdominal pain, NO bleeding.   PE:  Alert and oriented         Appears comfortable         Skin warm and dry         Walks without difficulty          [place] results below  Quants had risen appropriately  [place] today showed viable SIUP at [redacted]w[redacted]d  US Ob Comp Less 14 Wks  03/06/2015   CLINICAL DATA:  Intermittent abdominal pain for the past 2-3 weeks. Four weeks 5 days pregnant by last menstrual period. Quantitative beta HCG 706.  EXAM: OBSTETRIC <14 WK [place] AND TRANSVAGINAL OB US  TECHNIQUE: Both transabdominal and transvaginal ultrasound examinations were performed for complete evaluation of the gestation as well as the maternal uterus, adnexal regions, and pelvic cul-de-sac. Transvaginal technique was performed to assess early pregnancy.  COMPARISON:  Previous examinations, the most recent dated 03/29/2013.  FINDINGS: Intrauterine gestational sac: Possible tiny developing gestational sac within the endometrium with a mean diameter of 2.2 mm.  Yolk sac:  Not visualized  Embryo:  Not visualized  Cardiac Activity: Not applicable  MSD: 2.2  mm   4 w   5  d               [place] EDC: 11/08/2015  Maternal uterus/adnexae: Probable right ovarian corpus luteum. Normal appearing left ovary. No free peritoneal fluid or subchorionic hemorrhage seen.  IMPRESSION: Possible tiny mid developing gestational sac within the endometrium with a mean sac diameter 2.2 mm giving an estimated gestational age of [redacted] weeks and 5 days which correlates with the estimated gestational age by last menstrual period. No yolk sac, fetal pole, or cardiac activity yet visualized. Recommend follow-up quantitative B-HCG levels and follow-up US in 14 days to confirm and  assess viability. This recommendation follows SRU consensus guidelines: Diagnostic Criteria for Nonviable Pregnancy Early in the First Trimester. [employer] 2013; 295:6213-08.   Electronically Signed   By: Claudie Revering M.D.   On: 03/06/2015 21:59   US Ob Transvaginal  03/19/2015   CLINICAL DATA:  Followup early pregnancy  EXAM: TRANSVAGINAL OB ULTRASOUND  TECHNIQUE: Transvaginal ultrasound was performed for complete evaluation of the gestation as well as the maternal uterus, adnexal regions, and pelvic cul-de-sac.  COMPARISON:  None.  FINDINGS: Intrauterine gestational sac: Single  Yolk sac:  Yes  Embryo:  Yes  Cardiac Activity: Yes  Heart Rate: 110 bpm  MSD:   mm    w     d  CRL:   3.5  mm   6 w 0 d                  [place] EDC: 11/12/2015  Maternal uterus/adnexae:  Subchorionic hemorrhage: None  Right ovary: Normal  Left ovary: Not visualize  Other :None  Free fluid:  None  IMPRESSION: 1. Single living intrauterine gestation. 2. The estimated gestational age is 59 weeks and 0 day.   Electronically Signed   By: Kerby Moors M.D.   On: 03/19/2015 16:34   US Ob Transvaginal  03/06/2015   CLINICAL DATA:  Intermittent abdominal pain for the past  2-3 weeks. Four weeks 5 days pregnant by last menstrual period. Quantitative beta HCG 706.  EXAM: OBSTETRIC <14 WK [place] AND TRANSVAGINAL OB US  TECHNIQUE: Both transabdominal and transvaginal ultrasound examinations were performed for complete evaluation of the gestation as well as the maternal uterus, adnexal regions, and pelvic cul-de-sac. Transvaginal technique was performed to assess early pregnancy.  COMPARISON:  Previous examinations, the most recent dated 03/29/2013.  FINDINGS: Intrauterine gestational sac: Possible tiny developing gestational sac within the endometrium with a mean diameter of 2.2 mm.  Yolk sac:  Not visualized  Embryo:  Not visualized  Cardiac Activity: Not applicable  MSD: 2.2  mm   4 w   5  d               [place] EDC: 11/08/2015  Maternal uterus/adnexae:  Probable right ovarian corpus luteum. Normal appearing left ovary. No free peritoneal fluid or subchorionic hemorrhage seen.  IMPRESSION: Possible tiny mid developing gestational sac within the endometrium with a mean sac diameter 2.2 mm giving an estimated gestational age of [redacted] weeks and 5 days which correlates with the estimated gestational age by last menstrual period. No yolk sac, fetal pole, or cardiac activity yet visualized. Recommend follow-up quantitative B-HCG levels and follow-up US in 14 days to confirm and assess viability. This recommendation follows SRU consensus guidelines: Diagnostic Criteria for Nonviable Pregnancy Early in the First Trimester. [employer] 2013; 562:1308-65.   Electronically Signed   By: Claudie Revering M.D.   On: 03/06/2015 21:59   Plans care with [facility] Has appointment for May 3rd there

## 2015-03-19 NOTE — Discharge Instructions (Signed)

## 2015-03-20 ENCOUNTER — Other Ambulatory Visit: Payer: Self-pay | Admitting: *Deleted

## 2015-03-20 MED ORDER — ACYCLOVIR 400 MG PO TABS: 400.0000 mg | ORAL_TABLET | Freq: Three times a day (TID) | ORAL | Status: DC

## 2015-03-25 ENCOUNTER — Encounter (HOSPITAL_COMMUNITY): Payer: Self-pay | Admitting: Emergency Medicine

## 2015-03-25 ENCOUNTER — Emergency Department (HOSPITAL_COMMUNITY)
Admission: EM | Admit: 2015-03-25 | Discharge: 2015-03-26 | Disposition: A | Discharge status: Home / Self Care | Payer: Medicaid Other | Attending: Emergency Medicine | Admitting: Emergency Medicine

## 2015-03-25 DIAGNOSIS — Z8659 Personal history of other mental and behavioral disorders: Secondary | ICD-10-CM | POA: Insufficient documentation

## 2015-03-25 DIAGNOSIS — Z79899 Other long term (current) drug therapy: Secondary | ICD-10-CM | POA: Diagnosis not present

## 2015-03-25 DIAGNOSIS — E669 Obesity, unspecified: Secondary | ICD-10-CM | POA: Insufficient documentation

## 2015-03-25 DIAGNOSIS — Z8619 Personal history of other infectious and parasitic diseases: Secondary | ICD-10-CM | POA: Diagnosis not present

## 2015-03-25 DIAGNOSIS — Z8742 Personal history of other diseases of the female genital tract: Secondary | ICD-10-CM | POA: Insufficient documentation

## 2015-03-25 DIAGNOSIS — O210 Mild hyperemesis gravidarum: Secondary | ICD-10-CM | POA: Diagnosis not present

## 2015-03-25 DIAGNOSIS — O99011 Anemia complicating pregnancy, first trimester: Secondary | ICD-10-CM | POA: Insufficient documentation

## 2015-03-25 DIAGNOSIS — Z3A01 Less than 8 weeks gestation of pregnancy: Secondary | ICD-10-CM | POA: Diagnosis not present

## 2015-03-25 DIAGNOSIS — Z87440 Personal history of urinary (tract) infections: Secondary | ICD-10-CM | POA: Insufficient documentation

## 2015-03-25 DIAGNOSIS — Z9889 Other specified postprocedural states: Secondary | ICD-10-CM | POA: Diagnosis not present

## 2015-03-25 DIAGNOSIS — O9989 Other specified diseases and conditions complicating pregnancy, childbirth and the puerperium: Secondary | ICD-10-CM | POA: Diagnosis present

## 2015-03-25 DIAGNOSIS — R5383 Other fatigue: Secondary | ICD-10-CM | POA: Diagnosis not present

## 2015-03-25 DIAGNOSIS — O99281 Endocrine, nutritional and metabolic diseases complicating pregnancy, first trimester: Secondary | ICD-10-CM | POA: Insufficient documentation

## 2015-03-25 NOTE — ED Notes (Addendum)
Pt reports nausea for about a week and states the medications she was given to help with the nausea are ineffective. Pt reports she is pregnant.

## 2015-03-26 ENCOUNTER — Other Ambulatory Visit: Payer: Self-pay | Admitting: Family Medicine

## 2015-03-26 ENCOUNTER — Other Ambulatory Visit: Payer: Medicaid Other

## 2015-03-26 DIAGNOSIS — Z3481 Encounter for supervision of other normal pregnancy, first trimester: Secondary | ICD-10-CM

## 2015-03-26 LAB — CBC WITH DIFFERENTIAL/PLATELET
BASOS ABS: 0 10*3/uL (ref 0.0–0.1)
Basophils Relative: 0 % (ref 0–1)
Eosinophils Absolute: 0.2 10*3/uL (ref 0.0–0.7)
Eosinophils Relative: 2 % (ref 0–5)
HEMATOCRIT: 32.4 % — AB (ref 36.0–46.0)
HEMOGLOBIN: 10.6 g/dL — AB (ref 12.0–15.0)
LYMPHS ABS: 3.7 10*3/uL (ref 0.7–4.0)
Lymphocytes Relative: 38 % (ref 12–46)
MCH: 31.6 pg (ref 26.0–34.0)
MCHC: 32.7 g/dL (ref 30.0–36.0)
MCV: 96.7 fL (ref 78.0–100.0)
MONOS PCT: 5 % (ref 3–12)
Monocytes Absolute: 0.5 10*3/uL (ref 0.1–1.0)
Neutro Abs: 5.5 10*3/uL (ref 1.7–7.7)
Neutrophils Relative %: 55 % (ref 43–77)
Platelets: 347 10*3/uL (ref 150–400)
RBC: 3.35 MIL/uL — ABNORMAL LOW (ref 3.87–5.11)
RDW: 13.2 % (ref 11.5–15.5)
WBC: 9.9 10*3/uL (ref 4.0–10.5)

## 2015-03-26 LAB — URINALYSIS, ROUTINE W REFLEX MICROSCOPIC
BILIRUBIN URINE: NEGATIVE
Glucose, UA: NEGATIVE mg/dL
HGB URINE DIPSTICK: NEGATIVE
Ketones, ur: NEGATIVE mg/dL
Leukocytes, UA: NEGATIVE
NITRITE: NEGATIVE
Protein, ur: NEGATIVE mg/dL
Specific Gravity, Urine: 1.043 — ABNORMAL HIGH (ref 1.005–1.030)
UROBILINOGEN UA: 1 mg/dL (ref 0.0–1.0)
pH: 6 (ref 5.0–8.0)

## 2015-03-26 LAB — BASIC METABOLIC PANEL
Anion gap: 5 (ref 5–15)
BUN: 16 mg/dL (ref 6–23)
CO2: 24 mmol/L (ref 19–32)
CREATININE: 0.71 mg/dL (ref 0.50–1.10)
Calcium: 8.4 mg/dL (ref 8.4–10.5)
Chloride: 107 mmol/L (ref 96–112)
GFR calc non Af Amer: >90 mL/min (ref >90)
GLUCOSE: 91 mg/dL (ref 70–99)
Potassium: 3.7 mmol/L (ref 3.5–5.1)
Sodium: 136 mmol/L (ref 135–145)

## 2015-03-26 MED ORDER — SODIUM CHLORIDE 0.9 % IV SOLN: 1000.0000 mL | Freq: Once | INTRAVENOUS | Status: DC

## 2015-03-26 MED ORDER — SODIUM CHLORIDE 0.9 % IV SOLN
1000.0000 mL | Freq: Once | INTRAVENOUS | Status: AC
  Administered 2015-03-26: 1000 mL via INTRAVENOUS

## 2015-03-26 MED ORDER — SODIUM CHLORIDE 0.9 % IV SOLN: 1000.0000 mL | INTRAVENOUS | Status: DC

## 2015-03-26 NOTE — Progress Notes (Signed)
New ob labs done today [facility] Robert Sperl

## 2015-03-26 NOTE — Discharge Instructions (Signed)
Eat small frequent meals.  Nippling on breakfast cereal or crackers will help keep nausea away.  You can also try vitamin B 6, and Unisom as listed on your handout.  Expect symptoms to improve as her pregnancy progresses.  It is important for you to stay active during her pregnancy.  Walk daily to help with fatigue.  It is safe for you to start a prenatal vitamin.  You are slightly anemic, and it is recommended that you start taking iron tablets.  Iron may cause constipation, and Colace or Miralax is safe in pregnancy.  All of these can be purchased over the counter.   Iron Deficiency Anemia Anemia is a condition in which there are less red blood cells or hemoglobin in the blood than normal. Hemoglobin is the part of red blood cells that carries oxygen. Iron deficiency anemia is anemia caused by too little iron. It is the most common type of anemia. It may leave you tired and short of breath. CAUSES   Lack of iron in the diet.  Poor absorption of iron, as seen with intestinal disorders.  Intestinal bleeding.  Heavy periods. SIGNS AND SYMPTOMS  Mild anemia may not be noticeable. Symptoms may include:  Fatigue.  Headache.  Pale skin.  Weakness.  Tiredness.  Shortness of breath.  Dizziness.  Cold hands and feet.  Fast or irregular heartbeat. DIAGNOSIS  Diagnosis requires a thorough evaluation and physical exam by your health care provider. Blood tests are generally used to confirm iron deficiency anemia. Additional tests may be done to find the underlying cause of your anemia. These may include:  Testing for blood in the stool (fecal occult blood test).  A procedure to see inside the colon and rectum (colonoscopy).  A procedure to see inside the esophagus and stomach (endoscopy). TREATMENT  Iron deficiency anemia is treated by correcting the cause of the deficiency. Treatment may involve:  Adding iron-rich foods to your diet.  Taking iron supplements. Pregnant or  breastfeeding women need to take extra iron because their normal diet usually does not provide the required amount.  Taking vitamins. Vitamin C improves the absorption of iron. Your health care provider may recommend that you take your iron tablets with a glass of orange juice or vitamin C supplement.  Medicines to make heavy menstrual flow lighter.  Surgery. HOME CARE INSTRUCTIONS   Take iron as directed by your health care provider.  If you cannot tolerate taking iron supplements by mouth, talk to your health care provider about taking them through a vein (intravenously) or an injection into a muscle.  For the best iron absorption, iron supplements should be taken on an empty stomach. If you cannot tolerate them on an empty stomach, you may need to take them with food.  Do not drink milk or take antacids at the same time as your iron supplements. Milk and antacids may interfere with the absorption of iron.  Iron supplements can cause constipation. Make sure to include fiber in your diet to prevent constipation. A stool softener may also be recommended.  Take vitamins as directed by your health care provider.  Eat a diet rich in iron. Foods high in iron include liver, lean beef, whole-grain bread, eggs, dried fruit, and dark green leafy vegetables. SEEK IMMEDIATE MEDICAL CARE IF:   You faint. If this happens, do not drive. Call your local emergency services (911 in [place] if no other help is available.  You have chest pain.  You feel nauseous or vomit.  You have severe or increased shortness of breath with activity.  You feel weak.  You have a rapid heartbeat.  You have unexplained sweating.  You become light-headed when getting up from a chair or bed. MAKE SURE YOU:   Understand these instructions.  Will watch your condition.  Will get help right away if you are not doing well or get worse. Document Released: 11/14/2000 Document Revised: 11/22/2013 Document Reviewed:  07/25/2013 [facility] Patient Information 2015 [place], [place]. This information is not intended to replace advice given to you by your health care provider. Make sure you discuss any questions you have with your health care provider.  Morning Sickness Morning sickness is when you feel sick to your stomach (nauseous) during pregnancy. This nauseous feeling may or may not come with vomiting. It often occurs in the morning but can be a problem any time of day. Morning sickness is most common during the first trimester, but it may continue throughout pregnancy. While morning sickness is unpleasant, it is usually harmless unless you develop severe and continual vomiting (hyperemesis gravidarum). This condition requires more intense treatment.  CAUSES  The cause of morning sickness is not completely known but seems to be related to normal hormonal changes that occur in pregnancy. RISK FACTORS You are at greater risk if you:  Experienced nausea or vomiting before your pregnancy.  Had morning sickness during a previous pregnancy.  Are pregnant with more than one baby, such as twins. TREATMENT  Do not use any medicines (prescription, over-the-counter, or herbal) for morning sickness without first talking to your health care provider. Your health care provider may prescribe or recommend:  Vitamin B6 supplements.  Anti-nausea medicines.  The herbal medicine ginger. HOME CARE INSTRUCTIONS   Only take over-the-counter or prescription medicines as directed by your health care provider.  Taking multivitamins before getting pregnant can prevent or decrease the severity of morning sickness in most women.  Eat a piece of dry toast or unsalted crackers before getting out of bed in the morning.  Eat five or six small meals a day.  Eat dry and bland foods (rice, baked potato). Foods high in carbohydrates are often helpful.  Do not drink liquids with your meals. Drink liquids between meals.  Avoid  greasy, fatty, and spicy foods.  Get someone to cook for you if the smell of any food causes nausea and vomiting.  If you feel nauseous after taking prenatal vitamins, take the vitamins at night or with a snack.  Snack on protein foods (nuts, yogurt, cheese) between meals if you are hungry.  Eat unsweetened gelatins for desserts.  Wearing an acupressure wristband (worn for sea sickness) may be helpful.  Acupuncture may be helpful.  Do not smoke.  Get a humidifier to keep the air in your house free of odors.  Get plenty of fresh air. SEEK MEDICAL CARE IF:   Your home remedies are not working, and you need medicine.  You feel dizzy or lightheaded.  You are losing weight. SEEK IMMEDIATE MEDICAL CARE IF:   You have persistent and uncontrolled nausea and vomiting.  You pass out (faint). MAKE SURE YOU:  Understand these instructions.  Will watch your condition.  Will get help right away if you are not doing well or get worse. Document Released: 01/08/2007 Document Revised: 11/22/2013 Document Reviewed: 05/04/2013 [facility] Patient Information 2015 [place], [place]. This information is not intended to replace advice given to you by your health care provider. Make sure you discuss any questions you  have with your health care provider.  Pregnancy and Anemia Anemia is a condition in which the concentration of red blood cells or hemoglobin in the blood is below normal. Hemoglobin is a substance in red blood cells that carries oxygen to the tissues of the body. Anemia results in not enough oxygen reaching these tissues.  Anemia during pregnancy is common because the fetus uses more iron and folic acid as it is developing. Your body may not produce enough red blood cells because of this. Also, during pregnancy, the liquid part of the blood (plasma) increases by about 50%, and the red blood cells increase by only 25%. This lowers the concentration of the red blood cells and creates a  natural anemia-like situation.  CAUSES  The most common cause of anemia during pregnancy is not having enough iron in the body to make red blood cells (iron deficiency anemia). Other causes may include:  Folic acid deficiency.  Vitamin B12 deficiency.  Certain prescription or over-the-counter medicines.  Certain medical conditions or infections that destroy red blood cells.  A low platelet count and bleeding caused by antibodies that go through the placenta to the fetus from the mother's blood. SIGNS AND SYMPTOMS  Mild anemia may not be noticeable. If it becomes severe, symptoms may include:  Tiredness.  Shortness of breath, especially with exercise.  Weakness.  Fainting.  Pale looking skin.  Headaches.  Feeling a fast or irregular heartbeat (palpitations). DIAGNOSIS  The type of anemia is usually diagnosed from your family and medical history and blood tests. TREATMENT  Treatment of anemia during pregnancy depends on the cause of the anemia. Treatment can include:  Supplements of iron, vitamin O75, or folic acid.  A blood transfusion. This may be needed if blood loss is severe.  Hospitalization. This may be needed if there is significant continual blood loss.  Dietary changes. HOME CARE INSTRUCTIONS   Follow your dietitian's or health care provider's dietary recommendations.  Increase your vitamin C intake. This will help the stomach absorb more iron.  Eat a diet rich in iron. This would include foods such as:  Liver.  Beef.  Whole grain bread.  Eggs.  Dried fruit.  Take iron and vitamins as directed by your health care provider.  Eat green leafy vegetables. These are a good source of folic acid. SEEK MEDICAL CARE IF:   You have frequent or lasting headaches.  You are looking pale.  You are bruising easily. SEEK IMMEDIATE MEDICAL CARE IF:   You have extreme weakness, shortness of breath, or chest pain.  You become dizzy or have trouble  concentrating.  You have heavy vaginal bleeding.  You develop a rash.  You have bloody or black, tarry stools.  You faint.  You vomit up blood.  You vomit repeatedly.  You have abdominal pain.  You have a fever or persistent symptoms for more than 2-3 days.  You have a fever and your symptoms suddenly get worse.  You are dehydrated. MAKE SURE YOU:   Understand these instructions.  Will watch your condition.  Will get help right away if you are not doing well or get worse. Document Released: 11/14/2000 Document Revised: 09/07/2013 Document Reviewed: 06/29/2013 [facility] Patient Information 2015 [place], [place]. This information is not intended to replace advice given to you by your health care provider. Make sure you discuss any questions you have with your health care provider.

## 2015-03-26 NOTE — ED Provider Notes (Signed)
CSN: [id number]     Arrival date & time 03/25/15  2106 History   First MD Initiated Contact with Patient 03/26/15 0001     Chief Complaint  Patient presents with  . Nausea     (Consider location/radiation/quality/duration/timing/severity/associated sxs/prior Treatment) HPI 29 year old G5, P4 at 7 weeks pregnancy, presents to the emergency department with complaint of persistent nausea despite Reglan and Phenergan.  Patient denies having any vomiting.  She reports her nausea improves.  If she eats.  Patient reports that she is overall more tired and fatigued with this pregnancy than she has been with prior pregnancies.  Patient was seen at [facility] last week, had ultrasound that showed 6 weeks intrauterine pregnancy.  She denies any abdominal pain.  She does complain of headache.  She has not yet started prenatal vitamins as she is unsure if she is able to.  She reports that she was told that she was low on her iron but has not started iron pills Past Medical History  Diagnosis Date  . Headache(784.0)   . Urinary tract infection   . Ovarian cyst   . Abnormal Pap smear     f/u was normal  . Infection     trich, chlamydia, gonorrhea  . Depression     doind good now   Past Surgical History  Procedure Laterality Date  . Implanon removal 08/2012  08/2012  . Cesarean section    . Tonsillectomy    . Wisdom tooth extraction     Family History  Problem Relation Age of Onset  . Other Neg Hx   . Asthma Brother   . Cancer Maternal Uncle     lung  . Diabetes Maternal Grandmother   . Cancer Maternal Grandmother     thyroid  . Hypertension Maternal Grandmother   . Hypertension Mother    History  Substance Use Topics  . Smoking status: Never Smoker   . Smokeless tobacco: Never Used     Comment: rare  . Alcohol Use: Yes     Comment: occ when pregnant   OB History    Gravida Para Term Preterm AB TAB SAB Ectopic Multiple Living   5 4 4       4      Review of Systems  See  History of Present Illness; otherwise all other systems are reviewed and negative   Allergies  Review of patient's allergies indicates no known allergies.  Home Medications   Prior to Admission medications   Medication Sig Start Date End Date Taking? Authorizing Provider  butalbital-acetaminophen-caffeine (FIORICET) 50-325-40 MG per tablet Take 1-2 tablets by mouth every 6 (six) hours as needed for headache. 03/08/15 03/07/16 Yes West Pugh, NP  metoCLOPramide (REGLAN) 10 MG tablet Take 1 tablet (10 mg total) by mouth every 6 (six) hours. 03/08/15  Yes West Pugh, NP  promethazine (PHENERGAN) 25 MG tablet Take 1 tablet (25 mg total) by mouth every 6 (six) hours as needed for nausea or vomiting. 03/08/15  Yes West Pugh, NP  acyclovir (ZOVIRAX) 400 MG tablet Take 1 tablet (400 mg total) by mouth 3 (three) times daily. Patient not taking: Reported on 03/25/2015 03/20/15 04/22/15  Sharon [address], MD   BP 126/86 mmHg  Pulse 84  Temp(Src) 98.1 F (36.7 C) (Oral)  Resp 14  SpO2 100%  LMP 02/01/2015 Physical Exam  Constitutional: She is oriented to person, place, and time. She appears well-developed and well-nourished.  Obese female, no acute distress  HENT:  Head: Normocephalic and atraumatic.  Nose: Nose normal.  Mouth/Throat: Oropharynx is clear and moist.  Eyes: Conjunctivae and EOM are normal. Pupils are equal, round, and reactive to light.  Neck: Normal range of motion. Neck supple. No JVD present. No tracheal deviation present. No thyromegaly present.  Cardiovascular: Normal rate, regular rhythm, normal heart sounds and intact distal pulses.  Exam reveals no gallop and no friction rub.   No murmur heard. Pulmonary/Chest: Effort normal and breath sounds normal. No stridor. No respiratory distress. She has no wheezes. She has no rales. She exhibits no tenderness.  Abdominal: Soft. Bowel sounds are normal. She exhibits no distension and no mass. There is no tenderness.  There is no rebound and no guarding.  Musculoskeletal: Normal range of motion. She exhibits no edema or tenderness.  Lymphadenopathy:    She has no cervical adenopathy.  Neurological: She is alert and oriented to person, place, and time. She displays normal reflexes. She exhibits normal muscle tone. Coordination normal.  Skin: Skin is warm and dry. No rash noted. No erythema. No pallor.  Psychiatric: She has a normal mood and affect. Her behavior is normal. Judgment and thought content normal.  Nursing note and vitals reviewed.   ED Course  Procedures (including critical care time) Labs Review Labs Reviewed  CBC WITH DIFFERENTIAL/PLATELET - Abnormal; Notable for the following:    RBC 3.35 (*)    Hemoglobin 10.6 (*)    HCT 32.4 (*)    All other components within normal limits  URINALYSIS, ROUTINE W REFLEX MICROSCOPIC - Abnormal; Notable for the following:    Specific Gravity, Urine 1.043 (*)    All other components within normal limits  BASIC METABOLIC PANEL    Imaging Review No results found.   EKG Interpretation None      MDM   Final diagnoses:  Morning sickness  Anemia affecting pregnancy in first trimester   29 year old female with fatigue and nausea.  She is not having vomiting.  Patient reports no improvement with Benadryl and Reglan.  We'll give her instructions on how to take over-the-counter Unisom and B-6 to see if that helps her symptoms.  We'll give her IV fluids here, patient instructed to eat small frequent meals to help with symptoms.  She has been sure that she can take iron tablets as well as prenatal vitamins over-the-counter.  She has follow-up with family practice clinic on May 2.    Linton Flemings, MD 03/26/15 [phone]

## 2015-03-26 NOTE — Progress Notes (Signed)
[facility] phlebotomist drew:  Prenatal labs, HIV, OB urine culture;   Sickle cell screen = negative  Done 01-18-2009

## 2015-03-28 LAB — OBSTETRIC PANEL
ANTIBODY SCREEN: NEGATIVE
Basophils Absolute: 0 10*3/uL (ref 0.0–0.1)
Basophils Relative: 0 % (ref 0–1)
EOS ABS: 0.2 10*3/uL (ref 0.0–0.7)
EOS PCT: 2 % (ref 0–5)
HCT: 33.1 % — ABNORMAL LOW (ref 36.0–46.0)
HEMOGLOBIN: 10.9 g/dL — AB (ref 12.0–15.0)
HEP B S AG: NEGATIVE
LYMPHS ABS: 2.3 10*3/uL (ref 0.7–4.0)
Lymphocytes Relative: 25 % (ref 12–46)
MCH: 31.1 pg (ref 26.0–34.0)
MCHC: 32.9 g/dL (ref 30.0–36.0)
MCV: 94.3 fL (ref 78.0–100.0)
MONO ABS: 0.5 10*3/uL (ref 0.1–1.0)
MONOS PCT: 5 % (ref 3–12)
MPV: 10 fL (ref 8.6–12.4)
NEUTROS PCT: 68 % (ref 43–77)
Neutro Abs: 6.3 10*3/uL (ref 1.7–7.7)
Platelets: 387 10*3/uL (ref 150–400)
RBC: 3.51 MIL/uL — AB (ref 3.87–5.11)
RDW: 13.2 % (ref 11.5–15.5)
Rh Type: NEGATIVE
Rubella: 1.92 Index — ABNORMAL HIGH (ref <0.90)
WBC: 9.3 10*3/uL (ref 4.0–10.5)

## 2015-03-28 LAB — CULTURE, OB URINE

## 2015-03-29 LAB — HIV ANTIBODY (ROUTINE TESTING W REFLEX): HIV: NONREACTIVE

## 2015-04-02 ENCOUNTER — Other Ambulatory Visit (HOSPITAL_COMMUNITY)
Admission: RE | Admit: 2015-04-02 | Discharge: 2015-04-02 | Disposition: A | Discharge status: Home / Self Care | Payer: Medicaid Other | Source: Ambulatory Visit | Attending: Family Medicine | Admitting: Family Medicine

## 2015-04-02 ENCOUNTER — Ambulatory Visit (INDEPENDENT_AMBULATORY_CARE_PROVIDER_SITE_OTHER): Payer: Medicaid Other | Admitting: Student

## 2015-04-02 ENCOUNTER — Encounter: Payer: Self-pay | Admitting: Student

## 2015-04-02 VITALS — BP 122/74 | HR 114 | Temp 98.6°F | Wt 232.8 lb

## 2015-04-02 DIAGNOSIS — Z113 Encounter for screening for infections with a predominantly sexual mode of transmission: Secondary | ICD-10-CM | POA: Diagnosis present

## 2015-04-02 DIAGNOSIS — O99341 Other mental disorders complicating pregnancy, first trimester: Secondary | ICD-10-CM | POA: Diagnosis not present

## 2015-04-02 DIAGNOSIS — Z3401 Encounter for supervision of normal first pregnancy, first trimester: Secondary | ICD-10-CM

## 2015-04-02 DIAGNOSIS — O99211 Obesity complicating pregnancy, first trimester: Secondary | ICD-10-CM | POA: Diagnosis not present

## 2015-04-02 DIAGNOSIS — Z01419 Encounter for gynecological examination (general) (routine) without abnormal findings: Secondary | ICD-10-CM | POA: Diagnosis not present

## 2015-04-02 DIAGNOSIS — F32A Depression, unspecified: Secondary | ICD-10-CM

## 2015-04-02 DIAGNOSIS — E669 Obesity, unspecified: Secondary | ICD-10-CM | POA: Diagnosis not present

## 2015-04-02 DIAGNOSIS — F329 Major depressive disorder, single episode, unspecified: Secondary | ICD-10-CM

## 2015-04-02 DIAGNOSIS — Z3491 Encounter for supervision of normal pregnancy, unspecified, first trimester: Secondary | ICD-10-CM | POA: Insufficient documentation

## 2015-04-02 LAB — COMPREHENSIVE METABOLIC PANEL
ALT: 14 U/L (ref 0–35)
AST: 14 U/L (ref 0–37)
Albumin: 3.9 g/dL (ref 3.5–5.2)
Alkaline Phosphatase: 45 U/L (ref 39–117)
BILIRUBIN TOTAL: 0.2 mg/dL (ref 0.2–1.2)
BUN: 14 mg/dL (ref 6–23)
CO2: 23 mEq/L (ref 19–32)
Calcium: 9.5 mg/dL (ref 8.4–10.5)
Chloride: 101 mEq/L (ref 96–112)
Creat: 0.71 mg/dL (ref 0.50–1.10)
Glucose, Bld: 73 mg/dL (ref 70–99)
Potassium: 4.1 mEq/L (ref 3.5–5.3)
Sodium: 139 mEq/L (ref 135–145)
Total Protein: 7.1 g/dL (ref 6.0–8.3)

## 2015-04-02 MED ORDER — SERTRALINE HCL 50 MG PO TABS: 50.0000 mg | ORAL_TABLET | Freq: Every day | ORAL | Status: DC

## 2015-04-02 MED ORDER — COMPLETENATE 29-1 MG PO CHEW: 1.0000 | CHEWABLE_TABLET | Freq: Every day | ORAL | Status: DC

## 2015-04-02 NOTE — Patient Instructions (Signed)
Return to clinic in 4 weeks  First Trimester of Pregnancy The first trimester of pregnancy is from week 1 until the end of week 12 (months 1 through 3). During this time, your baby will begin to develop inside you. At 6-8 weeks, the eyes and face are formed, and the heartbeat can be seen on ultrasound. At the end of 12 weeks, all the baby's organs are formed. Prenatal care is all the medical care you receive before the birth of your baby. Make sure you get good prenatal care and follow all of your doctor's instructions. HOME CARE  Medicines  Take medicine only as told by your doctor. Some medicines are safe and some are not during pregnancy.  Take your prenatal vitamins as told by your doctor.  Take medicine that helps you poop (stool softener) as needed if your doctor says it is okay. Diet  Eat regular, healthy meals.  Your doctor will tell you the amount of weight gain that is right for you.  Avoid raw meat and uncooked cheese.  If you feel sick to your stomach (nauseous) or throw up (vomit):  Eat 4 or 5 small meals a day instead of 3 large meals.  Try eating a few soda crackers.  Drink liquids between meals instead of during meals.  If you have a hard time pooping (constipation):  Eat high-fiber foods like fresh vegetables, fruit, and whole grains.  Drink enough fluids to keep your pee (urine) clear or pale yellow. Activity and Exercise  Exercise only as told by your doctor. Stop exercising if you have cramps or pain in your lower belly (abdomen) or low back.  Try to avoid standing for long periods of time. Move your legs often if you must stand in one place for a long time.  Avoid heavy lifting.  Wear low-heeled shoes. Sit and stand up straight.  You can have sex unless your doctor tells you not to. Relief of Pain or Discomfort  Wear a good support bra if your breasts are sore.  Take warm water baths (sitz baths) to soothe pain or discomfort caused by hemorrhoids.  Use hemorrhoid cream if your doctor says it is okay.  Rest with your legs raised if you have leg cramps or low back pain.  Wear support hose if you have puffy, bulging veins (varicose veins) in your legs. Raise (elevate) your feet for 15 minutes, 3-4 times a day. Limit salt in your diet. Prenatal Care  Schedule your prenatal visits by the twelfth week of pregnancy.  Write down your questions. Take them to your prenatal visits.  Keep all your prenatal visits as told by your doctor. Safety  Wear your seat belt at all times when driving.  Make a list of emergency phone numbers. The list should include numbers for family, friends, the hospital, and police and fire departments. General Tips  Ask your doctor for a referral to a local prenatal class. Begin classes no later than at the start of month 6 of your pregnancy.  Ask for help if you need counseling or help with nutrition. Your doctor can give you advice or tell you where to go for help.  Do not use hot tubs, steam rooms, or saunas.  Do not douche or use tampons or scented sanitary pads.  Do not cross your legs for long periods of time.  Avoid litter boxes and soil used by cats.  Avoid all smoking, herbs, and alcohol. Avoid drugs not approved by your doctor.  Visit  your dentist. At home, brush your teeth with a soft toothbrush. Be gentle when you floss. GET HELP IF:  You are dizzy.  You have mild cramps or pressure in your lower belly.  You have a nagging pain in your belly area.  You continue to feel sick to your stomach, throw up, or have watery poop (diarrhea).  You have a bad smelling fluid coming from your vagina.  You have pain with peeing (urination).  You have increased puffiness (swelling) in your face, hands, legs, or ankles. GET HELP RIGHT AWAY IF:   You have a fever.  You are leaking fluid from your vagina.  You have spotting or bleeding from your vagina.  You have very bad belly cramping or  pain.  You gain or lose weight rapidly.  You throw up blood. It may look like coffee grounds.  You are around people who have [place] measles, fifth disease, or chickenpox.  You have a very bad headache.  You have shortness of breath.  You have any kind of trauma, such as from a fall or a car accident. Document Released: 05/05/2008 Document Revised: 04/03/2014 Document Reviewed: 09/27/2013 [facility] Patient Information 2015 [place], [place]. This information is not intended to replace advice given to you by your health care provider. Make sure you discuss any questions you have with your health care provider.
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
## 2015-04-03 LAB — CERVICOVAGINAL ANCILLARY ONLY
Chlamydia: NEGATIVE
Chlamydia: NEGATIVE
NEISSERIA GONORRHEA: NEGATIVE
Neisseria Gonorrhea: NEGATIVE

## 2015-04-03 LAB — PROTEIN / CREATININE RATIO, URINE
CREATININE, URINE: 258.7 mg/dL
Protein Creatinine Ratio: 0.05 (ref <0.15)
Total Protein, Urine: 13 mg/dL (ref 5–24)

## 2015-04-03 NOTE — Progress Notes (Signed)
I was available as preceptor to resident for this patient's office visit.  

## 2015-04-04 LAB — CYTOLOGY - PAP

## 2015-04-06 ENCOUNTER — Other Ambulatory Visit (INDEPENDENT_AMBULATORY_CARE_PROVIDER_SITE_OTHER): Payer: Medicaid Other

## 2015-04-06 DIAGNOSIS — Z3481 Encounter for supervision of other normal pregnancy, first trimester: Secondary | ICD-10-CM | POA: Diagnosis present

## 2015-04-06 LAB — GLUCOSE, CAPILLARY
Comment 1: 1
Glucose-Capillary: 119 mg/dL — ABNORMAL HIGH (ref 70–99)

## 2015-04-09 ENCOUNTER — Telehealth: Payer: Self-pay | Admitting: Family Medicine

## 2015-04-09 DIAGNOSIS — G8929 Other chronic pain: Secondary | ICD-10-CM

## 2015-04-09 DIAGNOSIS — R51 Headache: Principal | ICD-10-CM

## 2015-04-09 NOTE — Telephone Encounter (Signed)
Red Team: Will refer pt to neurology. Please let her know. Thanks! --CMS

## 2015-04-09 NOTE — Telephone Encounter (Signed)
This patient is calling because she would like to know if she can be referred to a specialist for her headaches because nothing seems to help her. Please call the patient and inform her of what needs to be done. Thanks, [employer], ASA

## 2015-04-11 ENCOUNTER — Telehealth: Payer: Self-pay | Admitting: Family Medicine

## 2015-04-11 NOTE — Telephone Encounter (Signed)
Has a headache and is 110 weeks pregnant What can she take?

## 2015-04-11 NOTE — Telephone Encounter (Signed)
Pt can take the two prescribed medications (Fioricet and Reglan). They are both safe in pregnancy. Otherwise she can take Tylenol. She should NOT take ibuprofen. If these do not help, she needs to make an appointment to be seen. She has also been referred to neurology but appt has not been scheduled yet. Please let her know. Thanks. --CMS

## 2015-04-11 NOTE — Telephone Encounter (Signed)
Returned call to patient regarding headaches.  Pt stated she was unsure if she could take the medication prescribed for headaches. She has been taking Ibuprofen for the headaches.  Advised pt to stop taking the ibuprofen and to make an appt for headache follow-up.  Not appts for tomorrow, will forward to PCP for further advise.  Pt is about [redacted] weeks pregnant.  Derl Barrow, RN

## 2015-04-12 ENCOUNTER — Other Ambulatory Visit: Payer: Self-pay | Admitting: *Deleted

## 2015-04-12 MED ORDER — METOCLOPRAMIDE HCL 10 MG PO TABS: 10.0000 mg | ORAL_TABLET | Freq: Three times a day (TID) | ORAL | Status: DC | PRN

## 2015-04-12 MED ORDER — BUTALBITAL-APAP-CAFFEINE 50-325-40 MG PO TABS: 1.0000 | ORAL_TABLET | Freq: Three times a day (TID) | ORAL | Status: DC | PRN

## 2015-04-12 NOTE — Telephone Encounter (Signed)
Red Team: Please let pt know I will leave printed Rx's at front desk for pt to pick up (can't call in Fioricet). Thanks. --CMS

## 2015-04-12 NOTE — Addendum Note (Signed)
Addended by: Emmaline Kluver on: 04/12/2015 09:51 AM   Modules accepted: Orders

## 2015-04-12 NOTE — Telephone Encounter (Signed)
Pt stated she did not have anymore of the Fioricet and Reglan.  Pt requesting a refill.  Derl Barrow, RN

## 2015-04-12 NOTE — Telephone Encounter (Signed)
Left message on voicemail for patient to call back. 

## 2015-04-12 NOTE — Addendum Note (Signed)
Addended by: Derl Barrow on: 04/12/2015 09:03 AM   Modules accepted: Orders

## 2015-04-18 ENCOUNTER — Ambulatory Visit (INDEPENDENT_AMBULATORY_CARE_PROVIDER_SITE_OTHER): Payer: Medicaid Other | Admitting: Diagnostic Neuroimaging

## 2015-04-18 ENCOUNTER — Encounter: Payer: Self-pay | Admitting: Diagnostic Neuroimaging

## 2015-04-18 ENCOUNTER — Telehealth: Payer: Self-pay | Admitting: *Deleted

## 2015-04-18 VITALS — BP 115/72 | HR 68 | Ht 61.0 in | Wt 232.0 lb

## 2015-04-18 DIAGNOSIS — R51 Headache: Secondary | ICD-10-CM | POA: Diagnosis not present

## 2015-04-18 DIAGNOSIS — H538 Other visual disturbances: Secondary | ICD-10-CM | POA: Diagnosis not present

## 2015-04-18 DIAGNOSIS — R519 Headache, unspecified: Secondary | ICD-10-CM

## 2015-04-18 DIAGNOSIS — G43009 Migraine without aura, not intractable, without status migrainosus: Secondary | ICD-10-CM | POA: Diagnosis not present

## 2015-04-18 NOTE — Telephone Encounter (Signed)
Called the pager number and left the number to [facility]. If she calls back, I need her office number so I can call her about this pt.

## 2015-04-18 NOTE — Patient Instructions (Signed)
I will setup eye doctor exam.  Try to manage stress; ask for help from family and friends.
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
## 2015-04-23 ENCOUNTER — Telehealth: Payer: Self-pay | Admitting: Student
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
## 2015-05-01 ENCOUNTER — Ambulatory Visit (INDEPENDENT_AMBULATORY_CARE_PROVIDER_SITE_OTHER): Payer: Medicaid Other | Admitting: Student

## 2015-05-01 VITALS — BP 118/76 | HR 100 | Temp 98.3°F | Wt 238.0 lb

## 2015-05-01 DIAGNOSIS — O0001 Abdominal pregnancy with intrauterine pregnancy: Secondary | ICD-10-CM

## 2015-05-01 DIAGNOSIS — O000 Abdominal pregnancy: Secondary | ICD-10-CM
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
## 2015-05-01 NOTE — Patient Instructions (Signed)
Please return in 2 weeks  Please take diclegis for nausea Present to the hospital for contractions like pain, vaginal bleeding or increased watery discharge  APS Telephone [phone] Hours 8:30-5 Address [place]

## 2015-05-08 ENCOUNTER — Other Ambulatory Visit: Payer: Self-pay | Admitting: *Deleted

## 2015-05-08 MED ORDER — METOCLOPRAMIDE HCL 10 MG PO TABS: 10.0000 mg | ORAL_TABLET | Freq: Three times a day (TID) | ORAL | Status: DC | PRN

## 2015-05-08 MED ORDER — RANITIDINE HCL 150 MG PO CAPS: 150.0000 mg | ORAL_CAPSULE | Freq: Two times a day (BID) | ORAL | Status: DC | PRN

## 2015-05-08 MED ORDER — ACYCLOVIR 400 MG PO TABS: 400.0000 mg | ORAL_TABLET | Freq: Three times a day (TID) | ORAL | Status: DC

## 2015-05-16 ENCOUNTER — Telehealth: Payer: Self-pay | Admitting: *Deleted

## 2015-05-16 ENCOUNTER — Ambulatory Visit (INDEPENDENT_AMBULATORY_CARE_PROVIDER_SITE_OTHER): Payer: Medicaid Other | Admitting: Family Medicine

## 2015-05-16 ENCOUNTER — Encounter: Payer: Self-pay | Admitting: Family Medicine

## 2015-05-16 VITALS — BP 122/78 | HR 85 | Temp 98.3°F | Wt 241.1 lb

## 2015-05-16 DIAGNOSIS — Z3482 Encounter for supervision of other normal pregnancy, second trimester: Secondary | ICD-10-CM

## 2015-05-16 DIAGNOSIS — Z3480 Encounter for supervision of other normal pregnancy, unspecified trimester: Secondary | ICD-10-CM | POA: Insufficient documentation

## 2015-05-16 DIAGNOSIS — B3731 Acute candidiasis of vulva and vagina: Secondary | ICD-10-CM

## 2015-05-16 DIAGNOSIS — O26899 Other specified pregnancy related conditions, unspecified trimester: Secondary | ICD-10-CM | POA: Insufficient documentation

## 2015-05-16 DIAGNOSIS — B373 Candidiasis of vulva and vagina: Secondary | ICD-10-CM

## 2015-05-16 DIAGNOSIS — Z6791 Unspecified blood type, Rh negative: Secondary | ICD-10-CM | POA: Insufficient documentation

## 2015-05-16 DIAGNOSIS — O360120 Maternal care for anti-D [Rh] antibodies, second trimester, not applicable or unspecified: Secondary | ICD-10-CM

## 2015-05-16 MED ORDER — VITAMIN B-6 25 MG PO TABS: 25.0000 mg | ORAL_TABLET | Freq: Three times a day (TID) | ORAL | Status: DC

## 2015-05-16 MED ORDER — PRENATAL VITAMIN 27-0.8 MG PO TABS: 1.0000 | ORAL_TABLET | Freq: Every day | ORAL | Status: DC

## 2015-05-16 MED ORDER — COMPLETENATE 29-1 MG PO CHEW: 1.0000 | CHEWABLE_TABLET | Freq: Every day | ORAL | Status: DC

## 2015-05-16 MED ORDER — FLUCONAZOLE 150 MG PO TABS: 150.0000 mg | ORAL_TABLET | Freq: Every day | ORAL | Status: DC
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
## 2015-05-16 NOTE — Telephone Encounter (Signed)
New Rx sent in. Thanks! --CMS

## 2015-05-16 NOTE — Progress Notes (Signed)
I was preceptor the day of this visit.   

## 2015-05-16 NOTE — Patient Instructions (Signed)
Thank you for coming in, today!  Everything looks okay, today. The baby's heartbeat sounds good and your blood pressure is good. You'll get an ultrasound around 18 weeks. Start taking prenatal vitamins. Starting vitamin B6 three times per day to help with nausea. I will give you a medicine for yeast (Diflucan 150 mg one dose).  Come back in about 4 weeks to see Dr. Lincoln Brigham. If you need to come back sooner, give [place] a call. If you have contractions, bleeding / discharge, or bad pain go to the MAU at [facility].  Please feel free to call with any questions or concerns at any time, at [phone]. --Dr. Venetia Maxon

## 2015-05-16 NOTE — Telephone Encounter (Signed)
Received message from [facility] stating pt's Rx for Nat[place] prenatal vitamins is not covered by insurance. Please send in a different Rx for prenatal.  Please give them a call with questions at [phone]. Derl Barrow, RN

## 2015-05-21 ENCOUNTER — Other Ambulatory Visit: Payer: Self-pay | Admitting: *Deleted

## 2015-05-21 ENCOUNTER — Telehealth: Payer: Self-pay | Admitting: Family Medicine

## 2015-05-21 MED ORDER — PROMETHAZINE HCL 25 MG PO TABS: 25.0000 mg | ORAL_TABLET | Freq: Four times a day (QID) | ORAL | Status: DC | PRN

## 2015-05-21 MED ORDER — BUTALBITAL-APAP-CAFFEINE 50-325-40 MG PO TABS: 1.0000 | ORAL_TABLET | Freq: Three times a day (TID) | ORAL | Status: DC | PRN

## 2015-05-21 NOTE — Telephone Encounter (Signed)
Rx sent electronically for Phenergan. Thanks! --CMS

## 2015-05-21 NOTE — Telephone Encounter (Signed)
Pt informed. Layanna Charo, CMA  

## 2015-05-21 NOTE — Telephone Encounter (Signed)
Red Team: please call pt to let her know she can pick up a printed Rx for Fioricet at the front desk, at her convenience. Thanks! --CMS

## 2015-05-21 NOTE — Addendum Note (Signed)
Addended by: Emmaline Kluver on: 05/21/2015 10:54 AM   Modules accepted: Orders

## 2015-05-21 NOTE — Telephone Encounter (Signed)
Pt needs a refill on her nausea medicine, not sure what the name is, Pt goes to [facility]

## 2015-06-05 ENCOUNTER — Emergency Department (HOSPITAL_COMMUNITY): Payer: Medicaid Other

## 2015-06-05 ENCOUNTER — Emergency Department (HOSPITAL_COMMUNITY)
Admission: EM | Admit: 2015-06-05 | Discharge: 2015-06-05 | Disposition: A | Discharge status: Home / Self Care | Payer: Medicaid Other | Attending: Emergency Medicine | Admitting: Emergency Medicine

## 2015-06-05 ENCOUNTER — Encounter (HOSPITAL_COMMUNITY): Payer: Self-pay

## 2015-06-05 DIAGNOSIS — Y929 Unspecified place or not applicable: Secondary | ICD-10-CM | POA: Diagnosis not present

## 2015-06-05 DIAGNOSIS — S20219A Contusion of unspecified front wall of thorax, initial encounter: Secondary | ICD-10-CM | POA: Insufficient documentation

## 2015-06-05 DIAGNOSIS — S199XXA Unspecified injury of neck, initial encounter: Secondary | ICD-10-CM | POA: Insufficient documentation

## 2015-06-05 DIAGNOSIS — Z79899 Other long term (current) drug therapy: Secondary | ICD-10-CM | POA: Insufficient documentation

## 2015-06-05 DIAGNOSIS — Y999 Unspecified external cause status: Secondary | ICD-10-CM | POA: Diagnosis not present

## 2015-06-05 DIAGNOSIS — F329 Major depressive disorder, single episode, unspecified: Secondary | ICD-10-CM | POA: Diagnosis not present

## 2015-06-05 DIAGNOSIS — Z87440 Personal history of urinary (tract) infections: Secondary | ICD-10-CM | POA: Diagnosis not present

## 2015-06-05 DIAGNOSIS — S00511A Abrasion of lip, initial encounter: Secondary | ICD-10-CM | POA: Diagnosis not present

## 2015-06-05 DIAGNOSIS — Y939 Activity, unspecified: Secondary | ICD-10-CM | POA: Insufficient documentation

## 2015-06-05 DIAGNOSIS — Z8619 Personal history of other infectious and parasitic diseases: Secondary | ICD-10-CM | POA: Diagnosis not present

## 2015-06-05 DIAGNOSIS — O99342 Other mental disorders complicating pregnancy, second trimester: Secondary | ICD-10-CM | POA: Diagnosis not present

## 2015-06-05 DIAGNOSIS — O9A212 Injury, poisoning and certain other consequences of external causes complicating pregnancy, second trimester: Secondary | ICD-10-CM | POA: Diagnosis present

## 2015-06-05 DIAGNOSIS — T148XXA Other injury of unspecified body region, initial encounter: Secondary | ICD-10-CM

## 2015-06-05 DIAGNOSIS — Z8742 Personal history of other diseases of the female genital tract: Secondary | ICD-10-CM | POA: Insufficient documentation

## 2015-06-05 DIAGNOSIS — Z3A18 18 weeks gestation of pregnancy: Secondary | ICD-10-CM | POA: Diagnosis not present

## 2015-06-05 DIAGNOSIS — Z3490 Encounter for supervision of normal pregnancy, unspecified, unspecified trimester: Secondary | ICD-10-CM

## 2015-06-05 MED ORDER — ACETAMINOPHEN 500 MG PO TABS
1000.0000 mg | ORAL_TABLET | Freq: Once | ORAL | Status: AC
  Administered 2015-06-05: 1000 mg via ORAL
  Filled 2015-06-05: qty 2

## 2015-06-05 MED ORDER — ACETAMINOPHEN 500 MG PO TABS: 500.0000 mg | ORAL_TABLET | Freq: Four times a day (QID) | ORAL | Status: DC | PRN
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
## 2015-06-05 NOTE — ED Notes (Signed)
MD at bedside. 
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
## 2015-06-13 ENCOUNTER — Ambulatory Visit (INDEPENDENT_AMBULATORY_CARE_PROVIDER_SITE_OTHER): Payer: Medicaid Other | Admitting: Family Medicine

## 2015-06-13 VITALS — BP 123/64 | HR 104 | Temp 98.9°F | Wt 239.9 lb

## 2015-06-13 DIAGNOSIS — O360120 Maternal care for anti-D [Rh] antibodies, second trimester, not applicable or unspecified: Secondary | ICD-10-CM

## 2015-06-13 DIAGNOSIS — Z3482 Encounter for supervision of other normal pregnancy, second trimester: Secondary | ICD-10-CM
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
## 2015-06-13 NOTE — Assessment & Plan Note (Signed)
Will need prophylactic Valtrex at 34wks

## 2015-06-13 NOTE — Patient Instructions (Signed)
Second Trimester of Pregnancy The second trimester is from week 13 through week 28, months 4 through 6. The second trimester is often a time when you feel your best. Your body has also adjusted to being pregnant, and you begin to feel better physically. Usually, morning sickness has lessened or quit completely, you may have more energy, and you may have an increase in appetite. The second trimester is also a time when the fetus is growing rapidly. At the end of the sixth month, the fetus is about 9 inches long and weighs about 1 pounds. You will likely begin to feel the baby move (quickening) between 18 and 20 weeks of the pregnancy. BODY CHANGES Your body goes through many changes during pregnancy. The changes vary from woman to woman.   Your weight will continue to increase. You will notice your lower abdomen bulging out.  You may begin to get stretch marks on your hips, abdomen, and breasts.  You may develop headaches that can be relieved by medicines approved by your health care provider.  You may urinate more often because the fetus is pressing on your bladder.  You may develop or continue to have heartburn as a result of your pregnancy.  You may develop constipation because certain hormones are causing the muscles that push waste through your intestines to slow down.  You may develop hemorrhoids or swollen, bulging veins (varicose veins).  You may have back pain because of the weight gain and pregnancy hormones relaxing your joints between the bones in your pelvis and as a result of a shift in weight and the muscles that support your balance.  Your breasts will continue to grow and be tender.  Your gums may bleed and may be sensitive to brushing and flossing.  Dark spots or blotches (chloasma, mask of pregnancy) may develop on your face. This will likely fade after the baby is born.  A dark line from your belly button to the pubic area (linea nigra) may appear. This will likely fade  after the baby is born.  You may have changes in your hair. These can include thickening of your hair, rapid growth, and changes in texture. Some women also have hair loss during or after pregnancy, or hair that feels dry or thin. Your hair will most likely return to normal after your baby is born. WHAT TO EXPECT AT YOUR PRENATAL VISITS During a routine prenatal visit:  You will be weighed to make sure you and the fetus are growing normally.  Your blood pressure will be taken.  Your abdomen will be measured to track your baby's growth.  The fetal heartbeat will be listened to.  Any test results from the previous visit will be discussed. Your health care provider may ask you:  How you are feeling.  If you are feeling the baby move.  If you have had any abnormal symptoms, such as leaking fluid, bleeding, severe headaches, or abdominal cramping.  If you have any questions. Other tests that may be performed during your second trimester include:  Blood tests that check for:  Low iron levels (anemia).  Gestational diabetes (between 24 and 28 weeks).  Rh antibodies.  Urine tests to check for infections, diabetes, or protein in the urine.  An ultrasound to confirm the proper growth and development of the baby.  An amniocentesis to check for possible genetic problems.  Fetal screens for spina bifida and Down syndrome. HOME CARE INSTRUCTIONS   Avoid all smoking, herbs, alcohol, and unprescribed   drugs. These chemicals affect the formation and growth of the baby.  Follow your health care provider's instructions regarding medicine use. There are medicines that are either safe or unsafe to take during pregnancy.  Exercise only as directed by your health care provider. Experiencing uterine cramps is a good sign to stop exercising.  Continue to eat regular, healthy meals.  Wear a good support bra for breast tenderness.  Do not use hot tubs, steam rooms, or saunas.  Wear your  seat belt at all times when driving.  Avoid raw meat, uncooked cheese, cat litter boxes, and soil used by cats. These carry germs that can cause birth defects in the baby.  Take your prenatal vitamins.  Try taking a stool softener (if your health care provider approves) if you develop constipation. Eat more high-fiber foods, such as fresh vegetables or fruit and whole grains. Drink plenty of fluids to keep your urine clear or pale yellow.  Take warm sitz baths to soothe any pain or discomfort caused by hemorrhoids. Use hemorrhoid cream if your health care provider approves.  If you develop varicose veins, wear support hose. Elevate your feet for 15 minutes, 3-4 times a day. Limit salt in your diet.  Avoid heavy lifting, wear low heel shoes, and practice good posture.  Rest with your legs elevated if you have leg cramps or low back pain.  Visit your dentist if you have not gone yet during your pregnancy. Use a soft toothbrush to brush your teeth and be gentle when you floss.  A sexual relationship may be continued unless your health care provider directs you otherwise.  Continue to go to all your prenatal visits as directed by your health care provider. SEEK MEDICAL CARE IF:   You have dizziness.  You have mild pelvic cramps, pelvic pressure, or nagging pain in the abdominal area.  You have persistent nausea, vomiting, or diarrhea.  You have a bad smelling vaginal discharge.  You have pain with urination. SEEK IMMEDIATE MEDICAL CARE IF:   You have a fever.  You are leaking fluid from your vagina.  You have spotting or bleeding from your vagina.  You have severe abdominal cramping or pain.  You have rapid weight gain or loss.  You have shortness of breath with chest pain.  You notice sudden or extreme swelling of your face, hands, ankles, feet, or legs.  You have not felt your baby move in over an hour.  You have severe headaches that do not go away with  medicine.  You have vision changes. Document Released: 11/11/2001 Document Revised: 11/22/2013 Document Reviewed: 01/18/2013 [employer] Patient Information 2015 [employer]. This information is not intended to replace advice given to you by your health care provider. Make sure you discuss any questions you have with your health care provider.  

## 2015-06-13 NOTE — Assessment & Plan Note (Addendum)
No vaginal bleeding. Will need Rhogam at 28wks

## 2015-06-19 ENCOUNTER — Other Ambulatory Visit: Payer: Self-pay | Admitting: Student

## 2015-06-19 ENCOUNTER — Other Ambulatory Visit: Payer: Self-pay | Admitting: *Deleted

## 2015-06-19 NOTE — Telephone Encounter (Signed)
Refill request from pharmacy. Will forward to PCP for review. Meri Pelot, CMA. 

## 2015-06-19 NOTE — Telephone Encounter (Signed)
Refilled Rx.   Thanks, Archie Patten, MD [facility] Family Medicine Resident  06/19/2015, 12:01 PM

## 2015-06-20 MED ORDER — PROMETHAZINE HCL 25 MG PO TABS: 25.0000 mg | ORAL_TABLET | Freq: Four times a day (QID) | ORAL | Status: DC | PRN

## 2015-06-22 ENCOUNTER — Ambulatory Visit (HOSPITAL_COMMUNITY)
Admission: RE | Admit: 2015-06-22 | Discharge: 2015-06-22 | Disposition: A | Discharge status: Home / Self Care | Payer: Medicaid Other | Source: Ambulatory Visit | Attending: Family Medicine | Admitting: Family Medicine

## 2015-06-22 ENCOUNTER — Other Ambulatory Visit: Payer: Self-pay | Admitting: Family Medicine

## 2015-06-22 DIAGNOSIS — Z3482 Encounter for supervision of other normal pregnancy, second trimester: Secondary | ICD-10-CM | POA: Diagnosis not present

## 2015-06-22 DIAGNOSIS — Z3A19 19 weeks gestation of pregnancy: Secondary | ICD-10-CM | POA: Insufficient documentation

## 2015-06-22 DIAGNOSIS — Z3689 Encounter for other specified antenatal screening: Secondary | ICD-10-CM | POA: Insufficient documentation

## 2015-06-29 ENCOUNTER — Inpatient Hospital Stay (HOSPITAL_COMMUNITY)
Admission: AD | Admit: 2015-06-29 | Discharge: 2015-06-30 | Disposition: A | Discharge status: Home / Self Care | Payer: Medicaid Other | Source: Ambulatory Visit | Attending: Family Medicine | Admitting: Family Medicine

## 2015-06-29 DIAGNOSIS — O26892 Other specified pregnancy related conditions, second trimester: Secondary | ICD-10-CM

## 2015-06-29 DIAGNOSIS — Z886 Allergy status to analgesic agent status: Secondary | ICD-10-CM | POA: Insufficient documentation

## 2015-06-29 DIAGNOSIS — R102 Pelvic and perineal pain: Secondary | ICD-10-CM | POA: Insufficient documentation

## 2015-06-29 DIAGNOSIS — Z3A21 21 weeks gestation of pregnancy: Secondary | ICD-10-CM | POA: Insufficient documentation

## 2015-06-29 DIAGNOSIS — O2342 Unspecified infection of urinary tract in pregnancy, second trimester: Secondary | ICD-10-CM | POA: Insufficient documentation

## 2015-06-29 NOTE — MAU Note (Signed)
I think I am having contractions for 2-3 days. Denies LOF or bleeding. Hx 1 C/S and 3 vag deliveries

## 2015-06-30 ENCOUNTER — Encounter (HOSPITAL_COMMUNITY): Payer: Self-pay | Admitting: *Deleted

## 2015-06-30 DIAGNOSIS — N949 Unspecified condition associated with female genital organs and menstrual cycle: Secondary | ICD-10-CM

## 2015-06-30 DIAGNOSIS — R102 Pelvic and perineal pain: Secondary | ICD-10-CM | POA: Diagnosis not present

## 2015-06-30 DIAGNOSIS — O26892 Other specified pregnancy related conditions, second trimester: Secondary | ICD-10-CM

## 2015-06-30 DIAGNOSIS — Z886 Allergy status to analgesic agent status: Secondary | ICD-10-CM | POA: Diagnosis not present

## 2015-06-30 DIAGNOSIS — Z3A21 21 weeks gestation of pregnancy: Secondary | ICD-10-CM | POA: Diagnosis not present

## 2015-06-30 DIAGNOSIS — O2342 Unspecified infection of urinary tract in pregnancy, second trimester: Secondary | ICD-10-CM | POA: Diagnosis not present

## 2015-06-30 LAB — URINE MICROSCOPIC-ADD ON

## 2015-06-30 LAB — URINALYSIS, ROUTINE W REFLEX MICROSCOPIC
Glucose, UA: NEGATIVE mg/dL
Hgb urine dipstick: NEGATIVE
Ketones, ur: 15 mg/dL — AB
Nitrite: NEGATIVE
PROTEIN: 30 mg/dL — AB
Urobilinogen, UA: 1 mg/dL (ref 0.0–1.0)
pH: 6 (ref 5.0–8.0)

## 2015-06-30 NOTE — Discharge Instructions (Signed)

## 2015-06-30 NOTE — MAU Provider Note (Signed)
History     CSN: [id number]  Arrival date and time: 06/29/15 2342   First Provider Initiated Contact with Patient 06/30/15 [phone]      Chief Complaint  Patient presents with  . Contractions   HPI  [name] is a 29 y.o. [id number] at [redacted]w[redacted]d here with report of lower abdominal cramping x 2 days.  Pain is intermittent in nature occuring every 1-2 hours.  Denies vaginal bleeding or leaking of fluid.  Denies UTI symptoms.  Recently moving and has been lifting a lot of items.  Intercourse earlier today.    Past Medical History  Diagnosis Date  . Headache(784.0)   . Urinary tract infection   . Ovarian cyst   . Abnormal Pap smear     f/u was normal  . Infection     trich, chlamydia, gonorrhea  . Depression     doind good now    Past Surgical History  Procedure Laterality Date  . Implanon removal 08/2012  08/2012  . Cesarean section    . Tonsillectomy    . Wisdom tooth extraction      Family History  Problem Relation Age of Onset  . Other Neg Hx   . Asthma Brother   . Cancer Maternal Uncle     lung  . Diabetes Maternal Grandmother   . Cancer Maternal Grandmother     thyroid  . Hypertension Maternal Grandmother   . Hypertension Mother     History  Substance Use Topics  . Smoking status: Never Smoker   . Smokeless tobacco: Never Used     Comment: rare  . Alcohol Use: Yes     Comment: occ when pregnant  LAST DRANK -2014    Allergies: No Known Allergies  Prescriptions prior to admission  Medication Sig Dispense Refill Last Dose  . acetaminophen (TYLENOL) 500 MG tablet Take 1 tablet (500 mg total) by mouth every 6 (six) hours as needed for mild pain. 30 tablet 0 Past Week at Unknown time  . Prenatal Vit-Fe Fumarate-FA (PRENATAL VITAMIN) 27-0.8 MG TABS Take 1 tablet by mouth daily. 30 tablet 8 Past Month at Unknown time  . butalbital-acetaminophen-caffeine (FIORICET) 50-325-40 MG per tablet Take 1-2 tablets by mouth every 8 (eight) hours as needed for headache.  (Patient not taking: Reported on 06/30/2015) 90 tablet 1 More than a month at Unknown time  . fluconazole (DIFLUCAN) 150 MG tablet Take 1 tablet (150 mg total) by mouth daily. 1 tablet 0 More than a month at Unknown time  . metoCLOPramide (REGLAN) 10 MG tablet Take 1 tablet (10 mg total) by mouth 3 (three) times daily as needed for nausea. (Patient not taking: Reported on 06/30/2015) 90 tablet 1   . promethazine (PHENERGAN) 25 MG tablet Take 1 tablet (25 mg total) by mouth every 6 (six) hours as needed for nausea or vomiting. (Patient not taking: Reported on 06/30/2015) 30 tablet 0   . ranitidine (ZANTAC) 150 MG capsule Take 1 capsule (150 mg total) by mouth 2 (two) times daily as needed for heartburn. (Patient not taking: Reported on 06/30/2015) 60 capsule 2   . sertraline (ZOLOFT) 50 MG tablet Take 1 tablet (50 mg total) by mouth daily. (Patient not taking: Reported on 04/18/2015) 30 tablet 3 Not Taking  . sertraline (ZOLOFT) 50 MG tablet Take 1 tablet (50 mg total) by mouth daily. 30 tablet 1   . vitamin B-6 (PYRIDOXINE) 25 MG tablet Take 1 tablet (25 mg total) by mouth 3 (three) times daily. (  Patient not taking: Reported on 06/30/2015) 90 tablet 3     Review of Systems  Constitutional: Negative for fever and chills.  Gastrointestinal: Positive for abdominal pain (cramping). Negative for diarrhea and constipation.  Genitourinary: Negative for dysuria, urgency and frequency.  All other systems reviewed and are negative.  Physical Exam   Blood pressure 145/78, pulse 94, temperature 98.4 F (36.9 C), resp. rate 18, height 5\' 2"  (1.575 m), weight 240 lb (108.863 kg), last menstrual period 02/01/2015.  Physical Exam  Constitutional: She is oriented to person, place, and time. She appears well-developed and well-nourished.  HENT:  Head: Normocephalic.  Neck: Normal range of motion. Neck supple.  Cardiovascular: Normal rate, regular rhythm and normal heart sounds.   Respiratory: Effort normal and  breath sounds normal. No respiratory distress.  GI: Soft. There is no tenderness.  Genitourinary: No bleeding in the vagina. Vaginal discharge (mucusy) found.  Musculoskeletal: Normal range of motion. She exhibits no edema.  Neurological: She is alert and oriented to person, place, and time.  Skin: Skin is warm and dry.   Dilation: Closed Effacement (%): Thick Cervical Position: Posterior Exam by:: Reina Fuse, CNM  MAU Course  Procedures  Results for orders placed or performed during the hospital encounter of 06/29/15 (from the past 24 hour(s))  Urinalysis, Routine w reflex microscopic (not at [facility])     Status: Abnormal   Collection Time: 06/30/15 12:05 AM  Result Value Ref Range   Color, Urine YELLOW YELLOW   APPearance CLEAR CLEAR   Specific Gravity, Urine >1.030 (H) 1.005 - 1.030   pH 6.0 5.0 - 8.0   Glucose, UA NEGATIVE NEGATIVE mg/dL   Hgb urine dipstick NEGATIVE NEGATIVE   Bilirubin Urine SMALL (A) NEGATIVE   Ketones, ur 15 (A) NEGATIVE mg/dL   Protein, ur 30 (A) NEGATIVE mg/dL   Urobilinogen, UA 1.0 0.0 - 1.0 mg/dL   Nitrite NEGATIVE NEGATIVE   Leukocytes, UA TRACE (A) NEGATIVE  Urine microscopic-add on     Status: Abnormal   Collection Time: 06/30/15 12:05 AM  Result Value Ref Range   Squamous Epithelial / LPF FEW (A) RARE   WBC, UA 3-6 <3 WBC/hpf   RBC / HPF 0-2 <3 RBC/hpf   Bacteria, UA RARE RARE   Urine-Other MUCOUS PRESENT       Assessment and Plan  Pelvic Pain in Pregnancy Bacteria in Urine  Plan: Discharge to home Urine culture Pregnancy precautions given  Kathrine Haddock N 06/30/2015, 1:04 AM

## 2015-06-30 NOTE — MAU Note (Signed)
PT  SAYS SHE STARTED  CRAMPING  ON WED.Marland Kitchen  SHE  GETS   PNC  AT  [facility]. -   SEEN ON 7-12.     NO BLEEDING.   LAST SEX-   TODAY -  THEN  CRAMPING    BECAME  WORSE.     HAS BEEN  MOVING  FURNITURE    AND  BOXES IN APARTMENT

## 2015-07-02 LAB — URINE CULTURE

## 2015-07-10 ENCOUNTER — Other Ambulatory Visit: Payer: Self-pay | Admitting: *Deleted

## 2015-07-10 MED ORDER — ACYCLOVIR 400 MG PO TABS: 400.0000 mg | ORAL_TABLET | Freq: Three times a day (TID) | ORAL | Status: AC

## 2015-07-13 ENCOUNTER — Ambulatory Visit (INDEPENDENT_AMBULATORY_CARE_PROVIDER_SITE_OTHER): Payer: Medicaid Other | Admitting: Student

## 2015-07-13 VITALS — BP 124/70 | HR 106 | Temp 98.3°F | Wt 237.2 lb

## 2015-07-13 DIAGNOSIS — R300 Dysuria: Secondary | ICD-10-CM

## 2015-07-13 DIAGNOSIS — L298 Other pruritus: Secondary | ICD-10-CM | POA: Diagnosis present

## 2015-07-13 DIAGNOSIS — N898 Other specified noninflammatory disorders of vagina: Secondary | ICD-10-CM

## 2015-07-13 LAB — POCT UA - MICROSCOPIC ONLY

## 2015-07-13 LAB — POCT WET PREP (WET MOUNT): Clue Cells Wet Prep Whiff POC: NEGATIVE

## 2015-07-13 LAB — POCT URINALYSIS DIPSTICK
Bilirubin, UA: NEGATIVE
Glucose, UA: NEGATIVE
Ketones, UA: NEGATIVE
Nitrite, UA: NEGATIVE
PH UA: 7
Protein, UA: NEGATIVE
SPEC GRAV UA: 1.02
UROBILINOGEN UA: 1

## 2015-07-13 MED ORDER — FLUCONAZOLE 150 MG PO TABS: 150.0000 mg | ORAL_TABLET | Freq: Once | ORAL | Status: DC

## 2015-07-13 NOTE — Patient Instructions (Signed)
Please follow up in 2 weeks for Prenatal visit If you have cramping drink water and rest. If you have contractions greater than once every 10 minutes for over an hour, go to the Hospital. If you have bleeding like a period go to the hospital.  Make sure you drink 8-10 glasses of water per day
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
## 2015-07-13 NOTE — Addendum Note (Signed)
Addended by: [place] Pink on: 07/13/2015 04:21 PM   Modules accepted: Orders

## 2015-07-16 LAB — URINE CULTURE: Colony Count: 40000

## 2015-07-17 ENCOUNTER — Telehealth: Payer: Self-pay | Admitting: Student

## 2015-07-17 MED ORDER — SULFAMETHOXAZOLE-TRIMETHOPRIM 800-160 MG PO TABS: 1.0000 | ORAL_TABLET | Freq: Two times a day (BID) | ORAL | Status: DC

## 2015-07-17 NOTE — Telephone Encounter (Signed)
Urine Cx grew 40,000 cfu Proteus. Given her report of symptoms, will treat with bactrim DS BID  x3 days as bactrim is only advised against in the first trimester an d at term  Pt was called but she did not answer. A message was left, will try to reachagain  Ronnie Mallette A. Lincoln Brigham MD, [facility] Resident PGY-2 Pager [phone]

## 2015-07-18 ENCOUNTER — Telehealth: Payer: Self-pay | Admitting: Student

## 2015-07-18 NOTE — Telephone Encounter (Signed)
Pt again called regarding her urinary tract infection. She did not answer but a message was left. Will continue to try to reach her. Am Rx for bactrim x3 days was called into her pharmacy, This was also relayed in the message, Her next appointment is on 8/29 with Dr Lorenso Courier.   Rithvik Orcutt A. Lincoln Brigham MD, [facility] Resident PGY-2 Pager [phone]

## 2015-07-19 NOTE — Telephone Encounter (Signed)
LM for patient to call back. Jazmin Hartsell,CMA  

## 2015-07-19 NOTE — Telephone Encounter (Signed)
Sent mychart message to patient to contact office for urine results.  Was unable to reach her by phone again. Teffany Blaszczyk,CMA

## 2015-07-30 ENCOUNTER — Ambulatory Visit (INDEPENDENT_AMBULATORY_CARE_PROVIDER_SITE_OTHER): Payer: Medicaid Other | Admitting: Family Medicine

## 2015-07-30 VITALS — BP 126/63 | HR 87 | Temp 98.3°F | Wt 240.4 lb

## 2015-07-30 DIAGNOSIS — R300 Dysuria: Secondary | ICD-10-CM | POA: Diagnosis not present

## 2015-07-30 DIAGNOSIS — Z3492 Encounter for supervision of normal pregnancy, unspecified, second trimester: Secondary | ICD-10-CM

## 2015-07-30 DIAGNOSIS — Z331 Pregnant state, incidental: Secondary | ICD-10-CM | POA: Diagnosis not present

## 2015-07-30 LAB — POCT URINALYSIS DIPSTICK
Bilirubin, UA: NEGATIVE
Blood, UA: NEGATIVE
GLUCOSE UA: NEGATIVE
Ketones, UA: NEGATIVE
Leukocytes, UA: NEGATIVE
Nitrite, UA: NEGATIVE
Protein, UA: NEGATIVE
SPEC GRAV UA: 1.025
Urobilinogen, UA: 0.2
pH, UA: 6.5
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
## 2015-07-30 NOTE — Patient Instructions (Addendum)
You should follow up in 2 weeks with the [facility] OB clinic At that time, you will do a 1 hour glucose tolerance test to test for gestational diabetes.  I have order you an anatomy scan as well.    Second Trimester of Pregnancy The second trimester is from week 13 through week 28, months 4 through 6. The second trimester is often a time when you feel your best. Your body has also adjusted to being pregnant, and you begin to feel better physically. Usually, morning sickness has lessened or quit completely, you may have more energy, and you may have an increase in appetite. The second trimester is also a time when the fetus is growing rapidly. At the end of the sixth month, the fetus is about 9 inches long and weighs about 1 pounds. You will likely begin to feel the baby move (quickening) between 18 and 20 weeks of the pregnancy. BODY CHANGES Your body goes through many changes during pregnancy. The changes vary from woman to woman.   Your weight will continue to increase. You will notice your lower abdomen bulging out.  You may begin to get stretch marks on your hips, abdomen, and breasts.  You may develop headaches that can be relieved by medicines approved by your health care provider.  You may urinate more often because the fetus is pressing on your bladder.  You may develop or continue to have heartburn as a result of your pregnancy.  You may develop constipation because certain hormones are causing the muscles that push waste through your intestines to slow down.  You may develop hemorrhoids or swollen, bulging veins (varicose veins).  You may have back pain because of the weight gain and pregnancy hormones relaxing your joints between the bones in your pelvis and as a result of a shift in weight and the muscles that support your balance.  Your breasts will continue to grow and be tender.  Your gums may bleed and may be sensitive to brushing and flossing.  Dark spots or blotches  (chloasma, mask of pregnancy) may develop on your face. This will likely fade after the baby is born.  A dark line from your belly button to the pubic area (linea nigra) may appear. This will likely fade after the baby is born.  You may have changes in your hair. These can include thickening of your hair, rapid growth, and changes in texture. Some women also have hair loss during or after pregnancy, or hair that feels dry or thin. Your hair will most likely return to normal after your baby is born. WHAT TO EXPECT AT YOUR PRENATAL VISITS During a routine prenatal visit:  You will be weighed to make sure you and the fetus are growing normally.  Your blood pressure will be taken.  Your abdomen will be measured to track your baby's growth.  The fetal heartbeat will be listened to.  Any test results from the previous visit will be discussed. Your health care provider may ask you:  How you are feeling.  If you are feeling the baby move.  If you have had any abnormal symptoms, such as leaking fluid, bleeding, severe headaches, or abdominal cramping.  If you have any questions. Other tests that may be performed during your second trimester include:  Blood tests that check for:  Low iron levels (anemia).  Gestational diabetes (between 24 and 28 weeks).  Rh antibodies.  Urine tests to check for infections, diabetes, or protein in the urine.  An ultrasound to confirm the proper growth and development of the baby.  An amniocentesis to check for possible genetic problems.  Fetal screens for spina bifida and Down syndrome. HOME CARE INSTRUCTIONS   Avoid all smoking, herbs, alcohol, and unprescribed drugs. These chemicals affect the formation and growth of the baby.  Follow your health care provider's instructions regarding medicine use. There are medicines that are either safe or unsafe to take during pregnancy.  Exercise only as directed by your health care provider. Experiencing  uterine cramps is a good sign to stop exercising.  Continue to eat regular, healthy meals.  Wear a good support bra for breast tenderness.  Do not use hot tubs, steam rooms, or saunas.  Wear your seat belt at all times when driving.  Avoid raw meat, uncooked cheese, cat litter boxes, and soil used by cats. These carry germs that can cause birth defects in the baby.  Take your prenatal vitamins.  Try taking a stool softener (if your health care provider approves) if you develop constipation. Eat more high-fiber foods, such as fresh vegetables or fruit and whole grains. Drink plenty of fluids to keep your urine clear or pale yellow.  Take warm sitz baths to soothe any pain or discomfort caused by hemorrhoids. Use hemorrhoid cream if your health care provider approves.  If you develop varicose veins, wear support hose. Elevate your feet for 15 minutes, 3-4 times a day. Limit salt in your diet.  Avoid heavy lifting, wear low heel shoes, and practice good posture.  Rest with your legs elevated if you have leg cramps or low back pain.  Visit your dentist if you have not gone yet during your pregnancy. Use a soft toothbrush to brush your teeth and be gentle when you floss.  A sexual relationship may be continued unless your health care provider directs you otherwise.  Continue to go to all your prenatal visits as directed by your health care provider. SEEK MEDICAL CARE IF:   You have dizziness.  You have mild pelvic cramps, pelvic pressure, or nagging pain in the abdominal area.  You have persistent nausea, vomiting, or diarrhea.  You have a bad smelling vaginal discharge.  You have pain with urination. SEEK IMMEDIATE MEDICAL CARE IF:   You have a fever.  You are leaking fluid from your vagina.  You have spotting or bleeding from your vagina.  You have severe abdominal cramping or pain.  You have rapid weight gain or loss.  You have shortness of breath with chest  pain.  You notice sudden or extreme swelling of your face, hands, ankles, feet, or legs.  You have not felt your baby move in over an hour.  You have severe headaches that do not go away with medicine.  You have vision changes. Document Released: 11/11/2001 Document Revised: 11/22/2013 Document Reviewed: 01/18/2013 [facility] Patient Information 2015 [place], [place]. This information is not intended to replace advice given to you by your health care provider. Make sure you discuss any questions you have with your health care provider.

## 2015-07-31 ENCOUNTER — Other Ambulatory Visit: Payer: Self-pay | Admitting: *Deleted

## 2015-08-13 ENCOUNTER — Ambulatory Visit (HOSPITAL_COMMUNITY)
Admission: RE | Admit: 2015-08-13 | Payer: Medicaid Other | Source: Ambulatory Visit | Attending: Family Medicine | Admitting: Family Medicine

## 2015-08-13 ENCOUNTER — Ambulatory Visit (INDEPENDENT_AMBULATORY_CARE_PROVIDER_SITE_OTHER): Payer: Medicaid Other | Admitting: Family Medicine

## 2015-08-13 ENCOUNTER — Other Ambulatory Visit: Payer: Self-pay | Admitting: *Deleted

## 2015-08-13 VITALS — BP 126/68 | HR 97 | Temp 98.6°F | Wt 242.0 lb

## 2015-08-13 DIAGNOSIS — O163 Unspecified maternal hypertension, third trimester: Secondary | ICD-10-CM

## 2015-08-13 DIAGNOSIS — Z3482 Encounter for supervision of other normal pregnancy, second trimester: Secondary | ICD-10-CM

## 2015-08-13 LAB — POCT URINALYSIS DIPSTICK
BILIRUBIN UA: NEGATIVE
Glucose, UA: NEGATIVE
Leukocytes, UA: NEGATIVE
Nitrite, UA: NEGATIVE
Protein, UA: 30
RBC UA: NEGATIVE
UROBILINOGEN UA: 1
pH, UA: 6.5

## 2015-08-13 LAB — GLUCOSE, CAPILLARY: Glucose-Capillary: 134 mg/dL — ABNORMAL HIGH (ref 65–99)

## 2015-08-13 LAB — POCT UA - MICROSCOPIC ONLY

## 2015-08-13 MED ORDER — RANITIDINE HCL 150 MG PO CAPS: 150.0000 mg | ORAL_CAPSULE | Freq: Two times a day (BID) | ORAL | Status: DC | PRN
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
## 2015-08-21 ENCOUNTER — Ambulatory Visit (HOSPITAL_COMMUNITY)
Admission: RE | Admit: 2015-08-21 | Discharge: 2015-08-21 | Disposition: A | Discharge status: Home / Self Care | Payer: Medicaid Other | Source: Ambulatory Visit | Attending: Family Medicine | Admitting: Family Medicine

## 2015-08-21 ENCOUNTER — Other Ambulatory Visit: Payer: Self-pay | Admitting: Family Medicine

## 2015-08-21 DIAGNOSIS — IMO0002 Reserved for concepts with insufficient information to code with codable children: Secondary | ICD-10-CM

## 2015-08-21 DIAGNOSIS — O3421 Maternal care for scar from previous cesarean delivery: Secondary | ICD-10-CM | POA: Diagnosis not present

## 2015-08-21 DIAGNOSIS — Z3492 Encounter for supervision of normal pregnancy, unspecified, second trimester: Secondary | ICD-10-CM

## 2015-08-21 DIAGNOSIS — Z3A28 28 weeks gestation of pregnancy: Secondary | ICD-10-CM

## 2015-08-21 DIAGNOSIS — Z36 Encounter for antenatal screening of mother: Secondary | ICD-10-CM | POA: Diagnosis not present

## 2015-08-21 DIAGNOSIS — O98513 Other viral diseases complicating pregnancy, third trimester: Secondary | ICD-10-CM | POA: Diagnosis not present

## 2015-08-21 DIAGNOSIS — O34219 Maternal care for unspecified type scar from previous cesarean delivery: Secondary | ICD-10-CM

## 2015-08-21 DIAGNOSIS — Z0489 Encounter for examination and observation for other specified reasons: Secondary | ICD-10-CM

## 2015-08-21 DIAGNOSIS — B009 Herpesviral infection, unspecified: Secondary | ICD-10-CM

## 2015-08-27 ENCOUNTER — Other Ambulatory Visit: Payer: Medicaid Other

## 2015-08-27 ENCOUNTER — Ambulatory Visit (INDEPENDENT_AMBULATORY_CARE_PROVIDER_SITE_OTHER): Payer: Medicaid Other | Admitting: Family Medicine

## 2015-08-27 VITALS — BP 129/74 | HR 99 | Temp 98.0°F | Wt 239.0 lb

## 2015-08-27 DIAGNOSIS — O2343 Unspecified infection of urinary tract in pregnancy, third trimester: Secondary | ICD-10-CM | POA: Diagnosis not present

## 2015-08-27 DIAGNOSIS — Z3482 Encounter for supervision of other normal pregnancy, second trimester: Secondary | ICD-10-CM | POA: Diagnosis not present

## 2015-08-27 DIAGNOSIS — O360120 Maternal care for anti-D [Rh] antibodies, second trimester, not applicable or unspecified: Secondary | ICD-10-CM | POA: Diagnosis not present

## 2015-08-27 DIAGNOSIS — Z23 Encounter for immunization: Secondary | ICD-10-CM | POA: Diagnosis not present

## 2015-08-27 LAB — CBC
HEMATOCRIT: 32.4 % — AB (ref 36.0–46.0)
HEMOGLOBIN: 11 g/dL — AB (ref 12.0–15.0)
MCH: 32.3 pg (ref 26.0–34.0)
MCHC: 34 g/dL (ref 30.0–36.0)
MCV: 95 fL (ref 78.0–100.0)
MPV: 10.3 fL (ref 8.6–12.4)
Platelets: 330 10*3/uL (ref 150–400)
RBC: 3.41 MIL/uL — AB (ref 3.87–5.11)
RDW: 13.1 % (ref 11.5–15.5)
WBC: 10.4 10*3/uL (ref 4.0–10.5)

## 2015-08-27 LAB — GLUCOSE, CAPILLARY: Glucose-Capillary: 69 mg/dL (ref 65–99)

## 2015-08-27 LAB — RPR

## 2015-08-27 NOTE — Assessment & Plan Note (Addendum)
Received Rhogam injection and antibody testing today (08/27/15) at [redacted]w[redacted]d

## 2015-08-27 NOTE — Progress Notes (Signed)
S: [name] [name] is a 29 y.o. [id number] at [redacted]w[redacted]d by LMP   Today reports overall doing well. Recently resolved UTI, finished Bactrim-DS BID x 3 day course, urine culture with 40k CFU Proteus. - Taking PNV daily, Tylenol PRN. Not taking Zantac or Zoloft. - Weight +/- down 3 lbs, good appetite most days but sometimes doesn't "feel like eating" and doesn't get but 2 regular meals - Denies any nausea, vomiting, headaches, vision changes, worsening edema, dysuria  Fetal Movement - daily Pain / Contractions - Endorses some pelvic cramping / "braxton hicks contractions" seems to be every 15-20min, most days when resting and laying down, not persistent and not regular.       Denies any fluid leakage, vaginal bleeding, or discharge  O: BP 129/74 mmHg  Pulse 99  Temp(Src) 98 F (36.7 C)  Wt 239 lb (108.41 kg)  LMP 02/01/2015  Gen - well-appearing, NAD Abd - soft, appropriately gravid for GA, NTND, +active BS Ext - non-tender, no edema, peripheral pulses intact +2 b/l  A&P: Normal supervision of pregnancy, in 3rd trimester, at [redacted]w[redacted]d - cont daily PNV, Tylenol PRN - fetal movement and labor precautions reviewed - Check routine 28wk labs with CBC, RPR, HIV screen - Received routine Tdap today  Rh Negative Mother Received Rhogam injection today Check antibody screen  Resolved UTI in pregnancy Re-check urine culture today. Asymptomatic, did not check UA  H/o Elevated BP in pregnancy - remains resolved Stable BP today at goal, < 140 / 90. Asymptomatic, without clinical concern for PreE.  Return in 2 weeks for next [facility] with faculty - at [address] [place], [place], PGY-3

## 2015-08-27 NOTE — Progress Notes (Unsigned)
3 HR GTT DONE TODAY MARCI HOLDER 

## 2015-08-27 NOTE — Patient Instructions (Signed)
Dear [name] [name], Thank you for coming in to clinic today.  1. You and your baby are doing well overall. 2. Measurements are normal today. Ultrasound on 08/21/15 looked normal, no more scans needed for anatomy, estimated growth is at 55%tile. 3. Tdap, Rhogam done today 4. Routine labs - follow-up results at next visit 5. Recommend eating small frequent meals for appropriate weight gain  If you feel like your baby is not moving regularly, then it is important to keep track of movements. Normal Movements - 10 movements in 2 hours - (quiet, resting, usually on Left side, feeling belly, may drink/snack, no other distractions, TV, music, or other noises) - OR - 10 movements in 12 hours - (daily activities, not focused on finding movements)  If you try to feel movements using the above tips for 2 hours, and still do not feel movements, please go to [place] [facility] ([facility]) to get checked out and placed on the Fetal Monitor.  Please go directly to [place] [facility] ([facility]) if: (also make sure that you tell them you are a Family Medicine Patient)  You begin to have strong, frequent contractions  Your water breaks. Sometimes it is a big gush of fluid, sometimes it is just a trickle that keeps getting your panties wet or running down your legs  You have vaginal bleeding. It is normal to have a small amount of spotting if your cervix was checked.   You don't feel your baby moving like normal. If you don't, get you something to eat and drink and lay down and focus on feeling your baby move. You should feel at least 10 movements in 2 hours. If you don't, you should call the office or go to [facility].   BP 129/74 mmHg  Pulse 99  Temp(Src) 98 F (36.7 C)  Wt 239 lb (108.41 kg)  LMP 02/01/2015  Please schedule a follow-up appointment with [facility] (Faculty Dr. Gwendlyn Deutscher or Nori Riis) in 2 weeks  If you have any other questions or concerns,  please feel free to call the clinic to contact me. You may also schedule an earlier appointment if necessary.  However, if your symptoms get significantly worse, please go to the Emergency Department to seek immediate medical attention.  [name] [name], [place]

## 2015-08-28 ENCOUNTER — Other Ambulatory Visit: Payer: Self-pay | Admitting: *Deleted

## 2015-08-28 LAB — URINE CULTURE

## 2015-08-28 LAB — GLUCOSE TOLERANCE, 3 HOURS
GLUCOSE 3 HOUR GTT: 78 mg/dL (ref 70–144)
GLUCOSE, 1 HOUR-GESTATIONAL: 100 mg/dL (ref 70–189)
GLUCOSE, 2 HOUR-GESTATIONAL: 89 mg/dL (ref 70–164)
Glucose Tolerance, Fasting: 67 mg/dL (ref 65–99)

## 2015-08-28 LAB — ANTIBODY SCREEN: Antibody Screen: NEGATIVE

## 2015-08-28 LAB — HIV ANTIBODY (ROUTINE TESTING W REFLEX): HIV: NONREACTIVE

## 2015-08-30 MED ORDER — METOCLOPRAMIDE HCL 10 MG PO TABS: 10.0000 mg | ORAL_TABLET | Freq: Three times a day (TID) | ORAL | Status: DC | PRN

## 2015-09-10 ENCOUNTER — Ambulatory Visit (INDEPENDENT_AMBULATORY_CARE_PROVIDER_SITE_OTHER): Payer: Medicaid Other | Admitting: Student

## 2015-09-10 VITALS — BP 143/83 | HR 97 | Temp 98.5°F | Wt 242.2 lb

## 2015-09-10 DIAGNOSIS — O0001 Abdominal pregnancy with intrauterine pregnancy: Secondary | ICD-10-CM

## 2015-09-10 NOTE — Patient Instructions (Signed)
Return in 2 weeks with Dr Lorenso Courier Call if you have questions Go to [facility] hospital if you have contractions for more than 2 hours, vaginal bleeding, loss of fluid like you broke your water. If you have not felt baby move, sit ina quiet place and count movements. You want 10 movements in 2 hours
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
## 2015-09-28 ENCOUNTER — Encounter (HOSPITAL_COMMUNITY): Payer: Self-pay | Admitting: *Deleted

## 2015-09-28 ENCOUNTER — Ambulatory Visit (INDEPENDENT_AMBULATORY_CARE_PROVIDER_SITE_OTHER): Payer: Medicaid Other | Admitting: Family Medicine

## 2015-09-28 ENCOUNTER — Inpatient Hospital Stay (HOSPITAL_COMMUNITY)
Admission: AD | Admit: 2015-09-28 | Discharge: 2015-09-28 | Disposition: A | Discharge status: Home / Self Care | Payer: Medicaid Other | Source: Ambulatory Visit | Attending: Obstetrics and Gynecology | Admitting: Obstetrics and Gynecology

## 2015-09-28 VITALS — BP 152/99 | HR 103 | Temp 97.7°F | Wt 238.0 lb

## 2015-09-28 DIAGNOSIS — Z3483 Encounter for supervision of other normal pregnancy, third trimester: Secondary | ICD-10-CM

## 2015-09-28 DIAGNOSIS — Z3A34 34 weeks gestation of pregnancy: Secondary | ICD-10-CM | POA: Insufficient documentation

## 2015-09-28 DIAGNOSIS — R0989 Other specified symptoms and signs involving the circulatory and respiratory systems: Secondary | ICD-10-CM

## 2015-09-28 DIAGNOSIS — R51 Headache: Secondary | ICD-10-CM | POA: Diagnosis not present

## 2015-09-28 DIAGNOSIS — O26893 Other specified pregnancy related conditions, third trimester: Secondary | ICD-10-CM | POA: Insufficient documentation

## 2015-09-28 DIAGNOSIS — R030 Elevated blood-pressure reading, without diagnosis of hypertension: Secondary | ICD-10-CM | POA: Diagnosis not present

## 2015-09-28 DIAGNOSIS — R519 Headache, unspecified: Secondary | ICD-10-CM

## 2015-09-28 LAB — COMPREHENSIVE METABOLIC PANEL
ALT: 17 U/L (ref 14–54)
ANION GAP: 6 (ref 5–15)
AST: 18 U/L (ref 15–41)
Albumin: 3.1 g/dL — ABNORMAL LOW (ref 3.5–5.0)
Alkaline Phosphatase: 95 U/L (ref 38–126)
BILIRUBIN TOTAL: 0.4 mg/dL (ref 0.3–1.2)
BUN: 7 mg/dL (ref 6–20)
CALCIUM: 8.6 mg/dL — AB (ref 8.9–10.3)
CO2: 25 mmol/L (ref 22–32)
Chloride: 103 mmol/L (ref 101–111)
Creatinine, Ser: 0.52 mg/dL (ref 0.44–1.00)
GFR calc non Af Amer: >60 mL/min (ref >60)
GLUCOSE: 73 mg/dL (ref 65–99)
Potassium: 3.6 mmol/L (ref 3.5–5.1)
Sodium: 134 mmol/L — ABNORMAL LOW (ref 135–145)
Total Protein: 7.1 g/dL (ref 6.5–8.1)

## 2015-09-28 LAB — CBC
HEMATOCRIT: 32.3 % — AB (ref 36.0–46.0)
HEMOGLOBIN: 10.6 g/dL — AB (ref 12.0–15.0)
MCH: 31.2 pg (ref 26.0–34.0)
MCHC: 32.8 g/dL (ref 30.0–36.0)
MCV: 95 fL (ref 78.0–100.0)
Platelets: 308 10*3/uL (ref 150–400)
RBC: 3.4 MIL/uL — ABNORMAL LOW (ref 3.87–5.11)
RDW: 13.1 % (ref 11.5–15.5)
WBC: 9.4 10*3/uL (ref 4.0–10.5)

## 2015-09-28 LAB — PROTEIN / CREATININE RATIO, URINE
CREATININE, URINE: 217 mg/dL
PROTEIN CREATININE RATIO: 0.1 mg/mg{creat} (ref 0.00–0.15)
TOTAL PROTEIN, URINE: 22 mg/dL

## 2015-09-28 MED ORDER — VALACYCLOVIR HCL 500 MG PO TABS: 500.0000 mg | ORAL_TABLET | Freq: Two times a day (BID) | ORAL | Status: DC

## 2015-09-28 NOTE — Discharge Instructions (Signed)

## 2015-09-28 NOTE — MAU Provider Note (Signed)
History     CSN: [id number]  Arrival date and time: 09/28/15 1660   First Provider Initiated Contact with Patient 09/28/15 1656      Chief Complaint  Patient presents with  . Hypertension   This is a 29 y.o. female at [redacted]w[redacted]d who presents from [facility] for evaluation of hypertension, headache and visual changes.  Does state she had had a one sided mild headache today and sometimes sees spots. Also has some RUQ pain at times. Has had one or two episodes of migraine prior to pregnancy, gets headaches only occasionally.    Hypertension This is a new problem. The current episode started today. The problem has been gradually improving since onset. Associated symptoms include headaches. Pertinent negatives include no anxiety, blurred vision (sees spots sometimes), malaise/fatigue, palpitations, peripheral edema or shortness of breath. There are no associated agents to hypertension. There are no known risk factors for coronary artery disease. Past treatments include nothing. There are no compliance problems.    RN Note: Sent over from [facility] for elevated b/p . C/o mild headache and blurred vision and spots.          OB History    Gravida Para Term Preterm AB TAB SAB Ectopic Multiple Living   5 4 4       4       Past Medical History  Diagnosis Date  . Headache(784.0)   . Urinary tract infection   . Ovarian cyst   . Abnormal Pap smear     f/u was normal  . Infection     trich, chlamydia, gonorrhea  . Depression     doind good now    Past Surgical History  Procedure Laterality Date  . Implanon removal 08/2012  08/2012  . Cesarean section    . Tonsillectomy    . Wisdom tooth extraction      Family History  Problem Relation Age of Onset  . Other Neg Hx   . Asthma Brother   . Cancer Maternal Uncle     lung  . Diabetes Maternal Grandmother   . Cancer Maternal Grandmother     thyroid  . Hypertension Maternal Grandmother   . Hypertension Mother     Social History   Substance Use Topics  . Smoking status: Never Smoker   . Smokeless tobacco: Never Used     Comment: rare  . Alcohol Use: Yes     Comment: occ when pregnant  LAST DRANK -2014    Allergies: No Known Allergies  Prescriptions prior to admission  Medication Sig Dispense Refill Last Dose  . butalbital-acetaminophen-caffeine (FIORICET, ESGIC) 50-325-40 MG tablet Take 1 tablet by mouth 2 (two) times daily as needed for headache.   Past Week at Unknown time  . acetaminophen (TYLENOL) 500 MG tablet Take 1 tablet (500 mg total) by mouth every 6 (six) hours as needed for mild pain. (Patient not taking: Reported on 09/28/2015) 30 tablet 0 Not Taking at Unknown time  . metoCLOPramide (REGLAN) 10 MG tablet Take 1 tablet (10 mg total) by mouth 3 (three) times daily as needed for nausea. (Patient not taking: Reported on 09/28/2015) 30 tablet 0 Not Taking at Unknown time  . Prenatal Vit-Fe Fumarate-FA (PRENATAL VITAMIN) 27-0.8 MG TABS Take 1 tablet by mouth daily. (Patient not taking: Reported on 09/28/2015) 30 tablet 8 Taking  . ranitidine (ZANTAC) 150 MG capsule Take 1 capsule (150 mg total) by mouth 2 (two) times daily as needed for heartburn. (Patient not taking: Reported on 08/27/2015)  60 capsule 2 Not Taking at Unknown time  . sertraline (ZOLOFT) 50 MG tablet Take 1 tablet (50 mg total) by mouth daily. (Patient not taking: Reported on 08/27/2015) 30 tablet 1 Not Taking   Medical, Surgical, Family and Social histories reviewed and are listed above.  Medications and allergies reviewed.  Review of Systems  Constitutional: Negative for fever, chills and malaise/fatigue.  Eyes: Negative for blurred vision (sees spots sometimes) and double vision.  Respiratory: Negative for shortness of breath.   Cardiovascular: Negative for palpitations and leg swelling.  Gastrointestinal: Positive for abdominal pain (mild RUQ). Negative for nausea, vomiting, diarrhea and constipation.  Musculoskeletal: Negative for  myalgias and back pain.  Neurological: Positive for headaches. Negative for dizziness, sensory change and focal weakness.   Physical Exam   Blood pressure 122/82, pulse 102, resp. rate 18, height 5\' 2"  (1.575 m), last menstrual period 02/01/2015. Filed Vitals:   09/28/15 1716 09/28/15 1731 09/28/15 1746 09/28/15 1801  BP: 120/66 136/68 120/58 122/82  Pulse: 95 94 103 102  Resp:      Height:        Physical Exam  Constitutional: She is oriented to person, place, and time. She appears well-developed and well-nourished. No distress.  HENT:  Head: Normocephalic.  Cardiovascular: Normal rate, regular rhythm and normal heart sounds.  Exam reveals no gallop and no friction rub.   No murmur heard. Respiratory: Effort normal and breath sounds normal. No respiratory distress. She has no wheezes. She has no rales.  GI: Soft. She exhibits no distension (Gravid). There is no tenderness. There is no rebound and no guarding.  Genitourinary: Vagina normal. No vaginal discharge found.   Cervix FT/long/ballot  FHR reactive Occasional contractions every 10-20 min   Musculoskeletal: Normal range of motion. She exhibits no edema or tenderness.  Neurological: She is alert and oriented to person, place, and time. She has normal reflexes. She displays normal reflexes. She exhibits normal muscle tone. Coordination normal.  Skin: Skin is warm and dry. She is not diaphoretic.  Psychiatric: She has a normal mood and affect.    MAU Course  Procedures  MDM Results for orders placed or performed during the hospital encounter of 09/28/15 (from the past 24 hour(s))  Protein / creatinine ratio, urine     Status: None   Collection Time: 09/28/15  4:43 PM  Result Value Ref Range   Creatinine, Urine 217.00 mg/dL   Total Protein, Urine 22 mg/dL   Protein Creatinine Ratio 0.10 0.00 - 0.15 mg/mg[Cre]  CBC     Status: Abnormal   Collection Time: 09/28/15  5:28 PM  Result Value Ref Range   WBC 9.4 4.0 - 10.5  K/uL   RBC 3.40 (L) 3.87 - 5.11 MIL/uL   Hemoglobin 10.6 (L) 12.0 - 15.0 g/dL   HCT 32.3 (L) 36.0 - 46.0 %   MCV 95.0 78.0 - 100.0 fL   MCH 31.2 26.0 - 34.0 pg   MCHC 32.8 30.0 - 36.0 g/dL   RDW 13.1 11.5 - 15.5 %   Platelets 308 150 - 400 K/uL  Comprehensive metabolic panel     Status: Abnormal   Collection Time: 09/28/15  5:28 PM  Result Value Ref Range   Sodium 134 (L) 135 - 145 mmol/L   Potassium 3.6 3.5 - 5.1 mmol/L   Chloride 103 101 - 111 mmol/L   CO2 25 22 - 32 mmol/L   Glucose, Bld 73 65 - 99 mg/dL   BUN 7 6 - 20  mg/dL   Creatinine, Ser 0.52 0.44 - 1.00 mg/dL   Calcium 8.6 (L) 8.9 - 10.3 mg/dL   Total Protein 7.1 6.5 - 8.1 g/dL   Albumin 3.1 (L) 3.5 - 5.0 g/dL   AST 18 15 - 41 U/L   ALT 17 14 - 54 U/L   Alkaline Phosphatase 95 38 - 126 U/L   Total Bilirubin 0.4 0.3 - 1.2 mg/dL   GFR calc non Af Amer >60 >60 mL/min   GFR calc Af Amer >60 >60 mL/min   Anion gap 6 5 - 15     Assessment and Plan  A:  SIUP at [redacted]w[redacted]d        Labile blood pressure, totally normotensive here       Negative preeclampsia labs with normal protein/creatinine ratio  P;  Discussed findings with patient       Discharge home       Ashley precautions       Follow up in office for prenatal visit  [facility] 09/28/2015, 6:15 PM

## 2015-09-28 NOTE — MAU Note (Signed)
Sent over from [facility] for elevated b/p . C/o mild headache and blurred vision and spots.

## 2015-09-28 NOTE — Addendum Note (Signed)
Addended by: Archie Patten on: 09/28/2015 05:50 PM   Modules accepted: Orders

## 2015-09-28 NOTE — Progress Notes (Signed)
[name] [name] is a 29 y.o. [id number] at [redacted]w[redacted]d for routine follow up.  She reports: Headaches started 3-4 weeks ago. Blurred vision started 1 month ago. Also endorses RUQ pain x 1 month. No facial swelling, only ankle edema.  She's also have contractions intermittently that last approximately 1 minute. No LOF or vaginal bleeding. See flow sheet for details.  She had a repeat BP that was 134/78 after 10 minutes of sitting.   A/P: Pregnancy at [redacted]w[redacted]d.  Given the patient's intermittently high BPs (also documented on 10/10) and constellation of symptoms, I would like for her to be evaluated for pre-eclampsia. Discussed work with attending, Dr. Nori Riis, here in clinic vs sending to the MAU. Given it is a Friday afternoon, determined that advising the patient to go to the MAU at the [facility] would be the best course of action. Called and informed Dr. Conley Canal over at the [facility].   Infant circumcision desired yes in clinic  Tdap was given on last visit.  GBS/GC/CZ testing was not performed today. History of herpes simplex, will start Valtrex 500mg  BID for prophylaxis.   Preterm labor precautions reviewed. Kick counts reviewed. Advised to go to the MAU.  Follow up in our clinic in 1 week.

## 2015-09-28 NOTE — MAU Note (Signed)
Urine in lab 

## 2015-09-28 NOTE — Patient Instructions (Addendum)
While your blood pressure has improved, but I am worried about the constellation of symptoms that you are having.  I would like for you to go to the Emergency room and the [facility] to be evaluated for pre-eclampsia which can cause headaches, blurred vision, and right upper quadrant pain. As you have a distant history of herpes, please start taking Valtrex 500mg  twice daily until after the delivery. Please follow up with [place] in 1 week.    Third Trimester of Pregnancy The third trimester is from week 29 through week 42, months 7 through 9. The third trimester is a time when the fetus is growing rapidly. At the end of the ninth month, the fetus is about 20 inches in length and weighs 6-10 pounds.  BODY CHANGES Your body goes through many changes during pregnancy. The changes vary from woman to woman.   Your weight will continue to increase. You can expect to gain 25-35 pounds (11-16 kg) by the end of the pregnancy.  You may begin to get stretch marks on your hips, abdomen, and breasts.  You may urinate more often because the fetus is moving lower into your pelvis and pressing on your bladder.  You may develop or continue to have heartburn as a result of your pregnancy.  You may develop constipation because certain hormones are causing the muscles that push waste through your intestines to slow down.  You may develop hemorrhoids or swollen, bulging veins (varicose veins).  You may have pelvic pain because of the weight gain and pregnancy hormones relaxing your joints between the bones in your pelvis. Backaches may result from overexertion of the muscles supporting your posture.  You may have changes in your hair. These can include thickening of your hair, rapid growth, and changes in texture. Some women also have hair loss during or after pregnancy, or hair that feels dry or thin. Your hair will most likely return to normal after your baby is born.  Your breasts will continue to grow  and be tender. A yellow discharge may leak from your breasts called colostrum.  Your belly button may stick out.  You may feel short of breath because of your expanding uterus.  You may notice the fetus "dropping," or moving lower in your abdomen.  You may have a bloody mucus discharge. This usually occurs a few days to a week before labor begins.  Your cervix becomes thin and soft (effaced) near your due date. WHAT TO EXPECT AT YOUR PRENATAL EXAMS  You will have prenatal exams every 2 weeks until week 36. Then, you will have weekly prenatal exams. During a routine prenatal visit:  You will be weighed to make sure you and the fetus are growing normally.  Your blood pressure is taken.  Your abdomen will be measured to track your baby's growth.  The fetal heartbeat will be listened to.  Any test results from the previous visit will be discussed.  You may have a cervical check near your due date to see if you have effaced. At around 36 weeks, your caregiver will check your cervix. At the same time, your caregiver will also perform a test on the secretions of the vaginal tissue. This test is to determine if a type of bacteria, Group B streptococcus, is present. Your caregiver will explain this further. Your caregiver may ask you:  What your birth plan is.  How you are feeling.  If you are feeling the baby move.  If you have had any  abnormal symptoms, such as leaking fluid, bleeding, severe headaches, or abdominal cramping.  If you are using any tobacco products, including cigarettes, chewing tobacco, and electronic cigarettes.  If you have any questions. Other tests or screenings that may be performed during your third trimester include:  Blood tests that check for low iron levels (anemia).  Fetal testing to check the health, activity level, and growth of the fetus. Testing is done if you have certain medical conditions or if there are problems during the pregnancy.  HIV  (human immunodeficiency virus) testing. If you are at high risk, you may be screened for HIV during your third trimester of pregnancy. FALSE LABOR You may feel small, irregular contractions that eventually go away. These are called Braxton Hicks contractions, or false labor. Contractions may last for hours, days, or even weeks before true labor sets in. If contractions come at regular intervals, intensify, or become painful, it is best to be seen by your caregiver.  SIGNS OF LABOR   Menstrual-like cramps.  Contractions that are 5 minutes apart or less.  Contractions that start on the top of the uterus and spread down to the lower abdomen and back.  A sense of increased pelvic pressure or back pain.  A watery or bloody mucus discharge that comes from the vagina. If you have any of these signs before the 37th week of pregnancy, call your caregiver right away. You need to go to the hospital to get checked immediately. HOME CARE INSTRUCTIONS   Avoid all smoking, herbs, alcohol, and unprescribed drugs. These chemicals affect the formation and growth of the baby.  Do not use any tobacco products, including cigarettes, chewing tobacco, and electronic cigarettes. If you need help quitting, ask your health care provider. You may receive counseling support and other resources to help you quit.  Follow your caregiver's instructions regarding medicine use. There are medicines that are either safe or unsafe to take during pregnancy.  Exercise only as directed by your caregiver. Experiencing uterine cramps is a good sign to stop exercising.  Continue to eat regular, healthy meals.  Wear a good support bra for breast tenderness.  Do not use hot tubs, steam rooms, or saunas.  Wear your seat belt at all times when driving.  Avoid raw meat, uncooked cheese, cat litter boxes, and soil used by cats. These carry germs that can cause birth defects in the baby.  Take your prenatal vitamins.  Take  1500-2000 mg of calcium daily starting at the 20th week of pregnancy until you deliver your baby.  Try taking a stool softener (if your caregiver approves) if you develop constipation. Eat more high-fiber foods, such as fresh vegetables or fruit and whole grains. Drink plenty of fluids to keep your urine clear or pale yellow.  Take warm sitz baths to soothe any pain or discomfort caused by hemorrhoids. Use hemorrhoid cream if your caregiver approves.  If you develop varicose veins, wear support hose. Elevate your feet for 15 minutes, 3-4 times a day. Limit salt in your diet.  Avoid heavy lifting, wear low heal shoes, and practice good posture.  Rest a lot with your legs elevated if you have leg cramps or low back pain.  Visit your dentist if you have not gone during your pregnancy. Use a soft toothbrush to brush your teeth and be gentle when you floss.  A sexual relationship may be continued unless your caregiver directs you otherwise.  Do not travel far distances unless it is absolutely necessary  and only with the approval of your caregiver.  Take prenatal classes to understand, practice, and ask questions about the labor and delivery.  Make a trial run to the hospital.  Pack your hospital bag.  Prepare the baby's nursery.  Continue to go to all your prenatal visits as directed by your caregiver. SEEK MEDICAL CARE IF:  You are unsure if you are in labor or if your water has broken.  You have dizziness.  You have mild pelvic cramps, pelvic pressure, or nagging pain in your abdominal area.  You have persistent nausea, vomiting, or diarrhea.  You have a bad smelling vaginal discharge.  You have pain with urination. SEEK IMMEDIATE MEDICAL CARE IF:   You have a fever.  You are leaking fluid from your vagina.  You have spotting or bleeding from your vagina.  You have severe abdominal cramping or pain.  You have rapid weight loss or gain.  You have shortness of breath  with chest pain.  You notice sudden or extreme swelling of your face, hands, ankles, feet, or legs.  You have not felt your baby move in over an hour.  You have severe headaches that do not go away with medicine.  You have vision changes.   This information is not intended to replace advice given to you by your health care provider. Make sure you discuss any questions you have with your health care provider.   Document Released: 11/11/2001 Document Revised: 12/08/2014 Document Reviewed: 01/18/2013 Elsevier Interactive Patient Education [employer].

## 2015-09-28 NOTE — Progress Notes (Signed)
BP rechecked 134/78 Wallace Cullens, CMA

## 2015-10-05 ENCOUNTER — Ambulatory Visit (INDEPENDENT_AMBULATORY_CARE_PROVIDER_SITE_OTHER): Payer: Medicaid Other | Admitting: Family Medicine

## 2015-10-05 VITALS — BP 126/80 | HR 107 | Temp 97.7°F | Wt 236.0 lb

## 2015-10-05 DIAGNOSIS — Z23 Encounter for immunization: Secondary | ICD-10-CM

## 2015-10-05 DIAGNOSIS — Z3483 Encounter for supervision of other normal pregnancy, third trimester: Secondary | ICD-10-CM

## 2015-10-05 DIAGNOSIS — O34219 Maternal care for unspecified type scar from previous cesarean delivery: Secondary | ICD-10-CM
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
## 2015-10-05 NOTE — Patient Instructions (Signed)
If you have any contractions which occur regularly, vaginal bleeding, fluid leaking, or are worried that baby is not moving well, go immediately to [facility] to be evaluated.  Also go for headache, vision changes, pain in right upper abdomen, swelling.  We'll need to get you in to see one of the OB doctors to talk about VBAC. See appointment time above. I am going to see if we can get you in earlier at the [facility] clinic for this discussion. If so, we'll call you with that appointment time.  Follow up here in 1 week Flu shot today  Be well, Dr. Ardelia Mems   Third Trimester of Pregnancy The third trimester is from week 29 through week 42, months 7 through 9. The third trimester is a time when the fetus is growing rapidly. At the end of the ninth month, the fetus is about 20 inches in length and weighs 6-10 pounds.  BODY CHANGES Your body goes through many changes during pregnancy. The changes vary from woman to woman.   Your weight will continue to increase. You can expect to gain 25-35 pounds (11-16 kg) by the end of the pregnancy.  You may begin to get stretch marks on your hips, abdomen, and breasts.  You may urinate more often because the fetus is moving lower into your pelvis and pressing on your bladder.  You may develop or continue to have heartburn as a result of your pregnancy.  You may develop constipation because certain hormones are causing the muscles that push waste through your intestines to slow down.  You may develop hemorrhoids or swollen, bulging veins (varicose veins).  You may have pelvic pain because of the weight gain and pregnancy hormones relaxing your joints between the bones in your pelvis. Backaches may result from overexertion of the muscles supporting your posture.  You may have changes in your hair. These can include thickening of your hair, rapid growth, and changes in texture. Some women also have hair loss during or after pregnancy, or hair  that feels dry or thin. Your hair will most likely return to normal after your baby is born.  Your breasts will continue to grow and be tender. A yellow discharge may leak from your breasts called colostrum.  Your belly button may stick out.  You may feel short of breath because of your expanding uterus.  You may notice the fetus "dropping," or moving lower in your abdomen.  You may have a bloody mucus discharge. This usually occurs a few days to a week before labor begins.  Your cervix becomes thin and soft (effaced) near your due date. WHAT TO EXPECT AT YOUR PRENATAL EXAMS  You will have prenatal exams every 2 weeks until week 36. Then, you will have weekly prenatal exams. During a routine prenatal visit:  You will be weighed to make sure you and the fetus are growing normally.  Your blood pressure is taken.  Your abdomen will be measured to track your baby's growth.  The fetal heartbeat will be listened to.  Any test results from the previous visit will be discussed.  You may have a cervical check near your due date to see if you have effaced. At around 36 weeks, your caregiver will check your cervix. At the same time, your caregiver will also perform a test on the secretions of the vaginal tissue. This test is to determine if a type of bacteria, Group B streptococcus, is present. Your caregiver will explain this further. Your  caregiver may ask you:  What your birth plan is.  How you are feeling.  If you are feeling the baby move.  If you have had any abnormal symptoms, such as leaking fluid, bleeding, severe headaches, or abdominal cramping.  If you are using any tobacco products, including cigarettes, chewing tobacco, and electronic cigarettes.  If you have any questions. Other tests or screenings that may be performed during your third trimester include:  Blood tests that check for low iron levels (anemia).  Fetal testing to check the health, activity level, and  growth of the fetus. Testing is done if you have certain medical conditions or if there are problems during the pregnancy.  HIV (human immunodeficiency virus) testing. If you are at high risk, you may be screened for HIV during your third trimester of pregnancy. FALSE LABOR You may feel small, irregular contractions that eventually go away. These are called Braxton Hicks contractions, or false labor. Contractions may last for hours, days, or even weeks before true labor sets in. If contractions come at regular intervals, intensify, or become painful, it is best to be seen by your caregiver.  SIGNS OF LABOR   Menstrual-like cramps.  Contractions that are 5 minutes apart or less.  Contractions that start on the top of the uterus and spread down to the lower abdomen and back.  A sense of increased pelvic pressure or back pain.  A watery or bloody mucus discharge that comes from the vagina. If you have any of these signs before the 37th week of pregnancy, call your caregiver right away. You need to go to the hospital to get checked immediately. HOME CARE INSTRUCTIONS   Avoid all smoking, herbs, alcohol, and unprescribed drugs. These chemicals affect the formation and growth of the baby.  Do not use any tobacco products, including cigarettes, chewing tobacco, and electronic cigarettes. If you need help quitting, ask your health care provider. You may receive counseling support and other resources to help you quit.  Follow your caregiver's instructions regarding medicine use. There are medicines that are either safe or unsafe to take during pregnancy.  Exercise only as directed by your caregiver. Experiencing uterine cramps is a good sign to stop exercising.  Continue to eat regular, healthy meals.  Wear a good support bra for breast tenderness.  Do not use hot tubs, steam rooms, or saunas.  Wear your seat belt at all times when driving.  Avoid raw meat, uncooked cheese, cat litter  boxes, and soil used by cats. These carry germs that can cause birth defects in the baby.  Take your prenatal vitamins.  Take 1500-2000 mg of calcium daily starting at the 20th week of pregnancy until you deliver your baby.  Try taking a stool softener (if your caregiver approves) if you develop constipation. Eat more high-fiber foods, such as fresh vegetables or fruit and whole grains. Drink plenty of fluids to keep your urine clear or pale yellow.  Take warm sitz baths to soothe any pain or discomfort caused by hemorrhoids. Use hemorrhoid cream if your caregiver approves.  If you develop varicose veins, wear support hose. Elevate your feet for 15 minutes, 3-4 times a day. Limit salt in your diet.  Avoid heavy lifting, wear low heal shoes, and practice good posture.  Rest a lot with your legs elevated if you have leg cramps or low back pain.  Visit your dentist if you have not gone during your pregnancy. Use a soft toothbrush to brush your teeth and  be gentle when you floss.  A sexual relationship may be continued unless your caregiver directs you otherwise.  Do not travel far distances unless it is absolutely necessary and only with the approval of your caregiver.  Take prenatal classes to understand, practice, and ask questions about the labor and delivery.  Make a trial run to the hospital.  Pack your hospital bag.  Prepare the baby's nursery.  Continue to go to all your prenatal visits as directed by your caregiver. SEEK MEDICAL CARE IF:  You are unsure if you are in labor or if your water has broken.  You have dizziness.  You have mild pelvic cramps, pelvic pressure, or nagging pain in your abdominal area.  You have persistent nausea, vomiting, or diarrhea.  You have a bad smelling vaginal discharge.  You have pain with urination. SEEK IMMEDIATE MEDICAL CARE IF:   You have a fever.  You are leaking fluid from your vagina.  You have spotting or bleeding from  your vagina.  You have severe abdominal cramping or pain.  You have rapid weight loss or gain.  You have shortness of breath with chest pain.  You notice sudden or extreme swelling of your face, hands, ankles, feet, or legs.  You have not felt your baby move in over an hour.  You have severe headaches that do not go away with medicine.  You have vision changes.   This information is not intended to replace advice given to you by your health care provider. Make sure you discuss any questions you have with your health care provider.   Document Released: 11/11/2001 Document Revised: 12/08/2014 Document Reviewed: 01/18/2013 Elsevier Interactive Patient Education [employer].

## 2015-10-09 ENCOUNTER — Telehealth: Payer: Self-pay | Admitting: *Deleted

## 2015-10-09 NOTE — Telephone Encounter (Signed)
-----   Message from Leeanne Rio, MD sent at 10/09/2015 12:16 AM EST ----- [facility] and see if this patient can get in sooner than 11/16 for a VBAC consent appointment?  This should have been done weeks ago but appears to have been missed. I have her scheduled with Dr. Kennon Rounds on the 16th but ideally we would get her seen sooner to have that talk, in case she goes into labor earlier.   See me with any questions,  Thanks! [place]

## 2015-10-09 NOTE — Telephone Encounter (Signed)
Attempted to call pt twice and left a message on her voicemail. Got her an appointment for tomorrow to see dr Kennon Rounds for her VBAC consultation. Appt is Wednesday 10/10/15 @ 11 at [facility] on the ground floor. Deseree Kennon Holter, CMA

## 2015-10-10 ENCOUNTER — Ambulatory Visit (INDEPENDENT_AMBULATORY_CARE_PROVIDER_SITE_OTHER): Payer: Medicaid Other | Admitting: Student

## 2015-10-10 ENCOUNTER — Other Ambulatory Visit (HOSPITAL_COMMUNITY)
Admission: RE | Admit: 2015-10-10 | Discharge: 2015-10-10 | Disposition: A | Discharge status: Home / Self Care | Payer: Medicaid Other | Source: Ambulatory Visit | Attending: Family Medicine | Admitting: Family Medicine

## 2015-10-10 VITALS — Temp 98.5°F | Wt 239.0 lb

## 2015-10-10 DIAGNOSIS — Z113 Encounter for screening for infections with a predominantly sexual mode of transmission: Secondary | ICD-10-CM | POA: Insufficient documentation

## 2015-10-10 DIAGNOSIS — Z3483 Encounter for supervision of other normal pregnancy, third trimester: Secondary | ICD-10-CM

## 2015-10-10 NOTE — Progress Notes (Signed)
  [facility] Attending Consult Note  29 y.o. [id number] at [redacted]w[redacted]d with Estimated Date of Delivery: 11/08/15 was seen today in office to discuss trial of labor after cesarean section (TOLAC) versus elective repeat cesarean delivery (ERCD). The following risks were discussed with the patient.  Risk of uterine rupture at term is 0.78 percent with TOLAC and 0.22 percent with ERCD. 1 in 10 uterine ruptures will result in neonatal death or neurological injury. The benefits of a trial of labor after cesarean (TOLAC) resulting in a vaginal birth after cesarean (VBAC) include the following: shorter length of hospital stay and postpartum recovery (in most cases); fewer complications, such as postpartum fever, wound or uterine infection, thromboembolism (blood clots in the leg or lung), need for blood transfusion and fewer neonatal breathing problems. The risks of an attempted VBAC or TOLAC include the following: Risk of failed trial of labor after cesarean (TOLAC) without a vaginal birth after cesarean (VBAC) resulting in repeat cesarean delivery (RCD) in about 20 to 53 percent of women who attempt VBAC.  Risk of rupture of uterus resulting in an emergency cesarean delivery. The risk of uterine rupture may be related in part to the type of uterine incision made during the first cesarean delivery. A previous transverse uterine incision has the lowest risk of rupture (0.2 to 1.5 percent risk). Vertical or T-shaped uterine incisions have a higher risk of uterine rupture (4 to 9 percent risk)The risk of fetal death is very low with both VBAC and elective repeat cesarean delivery (ERCD), but the likelihood of fetal death is higher with VBAC than with ERCD. Maternal death is very rare with either type of delivery. The risks of an elective repeat cesarean delivery (ERCD) were reviewed with the patient including but not limited to: 01/999 risk of uterine rupture which could have serious consequences, bleeding which  may require transfusion; infection which may require antibiotics; injury to bowel, bladder or other surrounding organs (bowel, bladder, ureters); injury to the fetus; need for additional procedures including hysterectomy in the event of a life-threatening hemorrhage; thromboembolic phenomenon; abnormal placentation; incisional problems; death and other postoperative or anesthesia complications.    These risks and benefits are summarized on the consent form, which was reviewed with the patient during the visit.  All her questions answered and she signed a consent indicating a preference for TOLAC. She has had 3 prior VBAC's successfully. A copy of the consent was given to the patient.   Will continue routine postpartum care at [facility].  Donnamae Jude MD Attending [place], [facility]

## 2015-10-10 NOTE — Patient Instructions (Signed)
Follow up in 1 weeks for Return OB If you have contractions, vaginal bleeding or loss of fluid like you broke your water go to the hospital right away. If you feel baby is moving less, sit for two hours in a quiet room and count baby's movements. You should have 10 movements in 2 hours
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
## 2015-10-11 LAB — STREP B DNA PROBE: GBSP: NOT DETECTED

## 2015-10-11 LAB — CERVICOVAGINAL ANCILLARY ONLY
Chlamydia: NEGATIVE
Neisseria Gonorrhea: POSITIVE — AB

## 2015-10-12 ENCOUNTER — Encounter: Payer: Self-pay | Admitting: Student

## 2015-10-12 ENCOUNTER — Telehealth: Payer: Self-pay | Admitting: Student

## 2015-10-12 ENCOUNTER — Encounter: Payer: Self-pay | Admitting: *Deleted

## 2015-10-12 MED ORDER — AZITHROMYCIN 500 MG PO TABS: 1000.0000 mg | ORAL_TABLET | Freq: Every day | ORAL | Status: DC

## 2015-10-12 NOTE — Telephone Encounter (Signed)
Pt was called several times regarding + gonorrhea test. Her voice mailbox is full. A letter will be sent to her regarding her positive result, need for treatment and need to abstain from intercourse until she and her partner are treated. Azithromycin will be sent to her pharmacy. She has an appointment with Family medicine again on 11/16 at which time confirmation of treatment will be obtained and if she has not been treated, we will treat in the office.   Jerrid Forgette A. Lincoln Brigham MD, [facility] Resident PGY-2 Pager [phone]

## 2015-10-15 ENCOUNTER — Other Ambulatory Visit: Payer: Self-pay | Admitting: Family Medicine

## 2015-10-17 ENCOUNTER — Ambulatory Visit (INDEPENDENT_AMBULATORY_CARE_PROVIDER_SITE_OTHER): Payer: Medicaid Other | Admitting: Family Medicine

## 2015-10-17 VITALS — BP 134/74 | HR 95 | Temp 98.2°F | Wt 236.0 lb

## 2015-10-17 DIAGNOSIS — O98213 Gonorrhea complicating pregnancy, third trimester: Secondary | ICD-10-CM

## 2015-10-17 DIAGNOSIS — Z3483 Encounter for supervision of other normal pregnancy, third trimester: Secondary | ICD-10-CM | POA: Diagnosis not present

## 2015-10-17 DIAGNOSIS — A609 Anogenital herpesviral infection, unspecified: Secondary | ICD-10-CM | POA: Diagnosis not present

## 2015-10-17 DIAGNOSIS — O34219 Maternal care for unspecified type scar from previous cesarean delivery: Secondary | ICD-10-CM | POA: Diagnosis not present

## 2015-10-17 DIAGNOSIS — O360120 Maternal care for anti-D [Rh] antibodies, second trimester, not applicable or unspecified: Secondary | ICD-10-CM | POA: Diagnosis not present

## 2015-10-17 MED ORDER — CEFTRIAXONE SODIUM 1 G IJ SOLR
250.0000 mg | Freq: Once | INTRAMUSCULAR | Status: AC
  Administered 2015-10-17: 250 mg via INTRAMUSCULAR

## 2015-10-17 MED ORDER — AZITHROMYCIN 500 MG PO TABS
1000.0000 mg | ORAL_TABLET | Freq: Once | ORAL | Status: AC
  Administered 2015-10-17: 1000 mg via ORAL

## 2015-10-17 NOTE — Progress Notes (Signed)
[name] [name] is a 29 y.o. [id number] at [redacted]w[redacted]d for routine follow up.  She reports intermittent mild contractions. See flow sheet for details.  A/P: Pregnancy at [redacted]w[redacted]d.  Doing well.   Pregnancy issues include gonorrhea infection, h/o HSV, c/s Intermittent elevated BP during this pregnancy, normal today, always been normal on recheck, labs ok, will continue to monitor  Infant feeding choice: breast and bottle Contraception choice: depo Infant circumcision desired yes  Tdapwas not given today. GBS/GC/CZ testing was not performed today. Positive gonorrhea last visit, treated for GC and CT today. GBS negative. Continue suppressive valtrex through delivery.  Desires VBAC and has completed counseling with pratt last week for consent. Has had successful VBACs before.  Preterm labor precautions reviewed. Safe sleep discussed. Kick counts reviewed. Follow up 1 week.

## 2015-10-17 NOTE — Addendum Note (Signed)
Addended by: Levert Feinstein F on: 10/17/2015 11:47 AM   Modules accepted: Orders

## 2015-10-17 NOTE — Patient Instructions (Signed)
Braxton Hicks Contractions °Contractions of the uterus can occur throughout pregnancy. Contractions are not always a sign that you are in labor.  °WHAT ARE BRAXTON HICKS CONTRACTIONS?  °Contractions that occur before labor are called Braxton Hicks contractions, or false labor. Toward the end of pregnancy (32-34 weeks), these contractions can develop more often and may become more forceful. This is not true labor because these contractions do not result in opening (dilatation) and thinning of the cervix. They are sometimes difficult to tell apart from true labor because these contractions can be forceful and people have different pain tolerances. You should not feel embarrassed if you go to the hospital with false labor. Sometimes, the only way to tell if you are in true labor is for your health care provider to look for changes in the cervix. °If there are no prenatal problems or other health problems associated with the pregnancy, it is completely safe to be sent home with false labor and await the onset of true labor. °HOW CAN YOU TELL THE DIFFERENCE BETWEEN TRUE AND FALSE LABOR? °False Labor °· The contractions of false labor are usually shorter and not as hard as those of true labor.   °· The contractions are usually irregular.   °· The contractions are often felt in the front of the lower abdomen and in the groin.   °· The contractions may go away when you walk around or change positions while lying down.   °· The contractions get weaker and are shorter lasting as time goes on.   °· The contractions do not usually become progressively stronger, regular, and closer together as with true labor.   °True Labor °· Contractions in true labor last 30-70 seconds, become very regular, usually become more intense, and increase in frequency.   °· The contractions do not go away with walking.   °· The discomfort is usually felt in the top of the uterus and spreads to the lower abdomen and low back.   °· True labor can be  determined by your health care provider with an exam. This will show that the cervix is dilating and getting thinner.   °WHAT TO REMEMBER °· Keep up with your usual exercises and follow other instructions given by your health care provider.   °· Take medicines as directed by your health care provider.   °· Keep your regular prenatal appointments.   °· Eat and drink lightly if you think you are going into labor.   °· If Braxton Hicks contractions are making you uncomfortable:   °¨ Change your position from lying down or resting to walking, or from walking to resting.   °¨ Sit and rest in a tub of warm water.   °¨ Drink 2-3 glasses of water. Dehydration may cause these contractions.   °¨ Do slow and deep breathing several times an hour.   °WHEN SHOULD I SEEK IMMEDIATE MEDICAL CARE? °Seek immediate medical care if: °· Your contractions become stronger, more regular, and closer together.   °· You have fluid leaking or gushing from your vagina.   °· You have a fever.   °· You pass blood-tinged mucus.   °· You have vaginal bleeding.   °· You have continuous abdominal pain.   °· You have low back pain that you never had before.   °· You feel your baby's head pushing down and causing pelvic pressure.   °· Your baby is not moving as much as it used to.   °  °This information is not intended to replace advice given to you by your health care provider. Make sure you discuss any questions you have with your health care   provider. °  °Document Released: 11/17/2005 Document Revised: 11/22/2013 Document Reviewed: 08/29/2013 °Elsevier Interactive Patient Education ©2016 Elsevier Inc. ° °

## 2015-10-19 ENCOUNTER — Ambulatory Visit: Payer: Medicaid Other | Admitting: Diagnostic Neuroimaging

## 2015-10-24 ENCOUNTER — Ambulatory Visit (INDEPENDENT_AMBULATORY_CARE_PROVIDER_SITE_OTHER): Payer: Medicaid Other | Admitting: Student

## 2015-10-24 VITALS — BP 116/64 | HR 95 | Temp 98.3°F | Wt 238.0 lb

## 2015-10-24 DIAGNOSIS — Z3483 Encounter for supervision of other normal pregnancy, third trimester: Secondary | ICD-10-CM

## 2015-10-24 NOTE — Patient Instructions (Signed)
Follow up in 1 week If you have regular contraction more than 3 in 10 minute for 1 hour, loss of fluid, vaginal bleeding like a period [facility] If you have questions or concerns call the office at [phone]
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
## 2015-10-30 ENCOUNTER — Ambulatory Visit (INDEPENDENT_AMBULATORY_CARE_PROVIDER_SITE_OTHER): Payer: Medicaid Other | Admitting: Family Medicine

## 2015-10-30 ENCOUNTER — Other Ambulatory Visit (HOSPITAL_COMMUNITY)
Admission: RE | Admit: 2015-10-30 | Discharge: 2015-10-30 | Disposition: A | Discharge status: Home / Self Care | Payer: Medicaid Other | Source: Ambulatory Visit | Attending: Family Medicine | Admitting: Family Medicine

## 2015-10-30 VITALS — BP 114/68 | HR 104 | Temp 98.6°F | Wt 240.0 lb

## 2015-10-30 DIAGNOSIS — Z113 Encounter for screening for infections with a predominantly sexual mode of transmission: Secondary | ICD-10-CM | POA: Diagnosis present

## 2015-10-30 DIAGNOSIS — Z3483 Encounter for supervision of other normal pregnancy, third trimester: Secondary | ICD-10-CM
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
## 2015-10-30 NOTE — Assessment & Plan Note (Signed)
HSV on suppression  Gonorrhea positive and treated on 11/16  Test for cure Gc/Ch today  - follow up in one week.

## 2015-10-30 NOTE — Patient Instructions (Signed)
Breastfeeding Deciding to breastfeed is one of the best choices you can make for you and your baby. A change in hormones during pregnancy causes your breast tissue to grow and increases the number and size of your milk ducts. These hormones also allow proteins, sugars, and fats from your blood supply to make breast milk in your milk-producing glands. Hormones prevent breast milk from being released before your baby is born as well as prompt milk flow after birth. Once breastfeeding has begun, thoughts of your baby, as well as his or her sucking or crying, can stimulate the release of milk from your milk-producing glands.  BENEFITS OF BREASTFEEDING For Your Baby  Your first milk (colostrum) helps your baby's digestive system function better.  There are antibodies in your milk that help your baby fight off infections.  Your baby has a lower incidence of asthma, allergies, and sudden infant death syndrome.  The nutrients in breast milk are better for your baby than infant formulas and are designed uniquely for your baby's needs.  Breast milk improves your baby's brain development.  Your baby is less likely to develop other conditions, such as childhood obesity, asthma, or type 2 diabetes mellitus. For You  Breastfeeding helps to create a very special bond between you and your baby.  Breastfeeding is convenient. Breast milk is always available at the correct temperature and costs nothing.  Breastfeeding helps to burn calories and helps you lose the weight gained during pregnancy.  Breastfeeding makes your uterus contract to its prepregnancy size faster and slows bleeding (lochia) after you give birth.   Breastfeeding helps to lower your risk of developing type 2 diabetes mellitus, osteoporosis, and breast or ovarian cancer later in life. SIGNS THAT YOUR BABY IS HUNGRY Early Signs of Hunger  Increased alertness or activity.  Stretching.  Movement of the head from side to  side.  Movement of the head and opening of the mouth when the corner of the mouth or cheek is stroked (rooting).  Increased sucking sounds, smacking lips, cooing, sighing, or squeaking.  Hand-to-mouth movements.  Increased sucking of fingers or hands. Late Signs of Hunger  Fussing.  Intermittent crying. Extreme Signs of Hunger Signs of extreme hunger will require calming and consoling before your baby will be able to breastfeed successfully. Do not wait for the following signs of extreme hunger to occur before you initiate breastfeeding:  Restlessness.  A loud, strong cry.  Screaming. BREASTFEEDING BASICS Breastfeeding Initiation  Find a comfortable place to sit or lie down, with your neck and back well supported.  Place a pillow or rolled up blanket under your baby to bring him or her to the level of your breast (if you are seated). Nursing pillows are specially designed to help support your arms and your baby while you breastfeed.  Make sure that your baby's abdomen is facing your abdomen.  Gently massage your breast. With your fingertips, massage from your chest wall toward your nipple in a circular motion. This encourages milk flow. You may need to continue this action during the feeding if your milk flows slowly.  Support your breast with 4 fingers underneath and your thumb above your nipple. Make sure your fingers are well away from your nipple and your baby's mouth.  Stroke your baby's lips gently with your finger or nipple.  When your baby's mouth is open wide enough, quickly bring your baby to your breast, placing your entire nipple and as much of the colored area around your nipple (  areola) as possible into your baby's mouth.  More areola should be visible above your baby's upper lip than below the lower lip.  Your baby's tongue should be between his or her lower gum and your breast.  Ensure that your baby's mouth is correctly positioned around your nipple  (latched). Your baby's lips should create a seal on your breast and be turned out (everted).  It is common for your baby to suck about 2-3 minutes in order to start the flow of breast milk. Latching Teaching your baby how to latch on to your breast properly is very important. An improper latch can cause nipple pain and decreased milk supply for you and poor weight gain in your baby. Also, if your baby is not latched onto your nipple properly, he or she may swallow some air during feeding. This can make your baby fussy. Burping your baby when you switch breasts during the feeding can help to get rid of the air. However, teaching your baby to latch on properly is still the best way to prevent fussiness from swallowing air while breastfeeding. Signs that your baby has successfully latched on to your nipple:  Silent tugging or silent sucking, without causing you pain.  Swallowing heard between every 3-4 sucks.  Muscle movement above and in front of his or her ears while sucking. Signs that your baby has not successfully latched on to nipple:  Sucking sounds or smacking sounds from your baby while breastfeeding.  Nipple pain. If you think your baby has not latched on correctly, slip your finger into the corner of your baby's mouth to break the suction and place it between your baby's gums. Attempt breastfeeding initiation again. Signs of Successful Breastfeeding Signs from your baby:  A gradual decrease in the number of sucks or complete cessation of sucking.  Falling asleep.  Relaxation of his or her body.  Retention of a small amount of milk in his or her mouth.  Letting go of your breast by himself or herself. Signs from you:  Breasts that have increased in firmness, weight, and size 1-3 hours after feeding.  Breasts that are softer immediately after breastfeeding.  Increased milk volume, as well as a change in milk consistency and color by the fifth day of breastfeeding.  Nipples  that are not sore, cracked, or bleeding. Signs That Your Baby is Getting Enough Milk  Wetting at least 3 diapers in a 24-hour period. The urine should be clear and pale yellow by age 5 days.  At least 3 stools in a 24-hour period by age 5 days. The stool should be soft and yellow.  At least 3 stools in a 24-hour period by age 7 days. The stool should be seedy and yellow.  No loss of weight greater than 10% of birth weight during the first 3 days of age.  Average weight gain of 4-7 ounces (113-198 g) per week after age 4 days.  Consistent daily weight gain by age 5 days, without weight loss after the age of 2 weeks. After a feeding, your baby may spit up a small amount. This is common. BREASTFEEDING FREQUENCY AND DURATION Frequent feeding will help you make more milk and can prevent sore nipples and breast engorgement. Breastfeed when you feel the need to reduce the fullness of your breasts or when your baby shows signs of hunger. This is called "breastfeeding on demand." Avoid introducing a pacifier to your baby while you are working to establish breastfeeding (the first 4-6 weeks   after your baby is born). After this time you may choose to use a pacifier. Research has shown that pacifier use during the first year of a baby's life decreases the risk of sudden infant death syndrome (SIDS). Allow your baby to feed on each breast as long as he or she wants. Breastfeed until your baby is finished feeding. When your baby unlatches or falls asleep while feeding from the first breast, offer the second breast. Because newborns are often sleepy in the first few weeks of life, you may need to awaken your baby to get him or her to feed. Breastfeeding times will vary from baby to baby. However, the following rules can serve as a guide to help you ensure that your baby is properly fed:  Newborns (babies 4 weeks of age or younger) may breastfeed every 1-3 hours.  Newborns should not go longer than 3 hours  during the day or 5 hours during the night without breastfeeding.  You should breastfeed your baby a minimum of 8 times in a 24-hour period until you begin to introduce solid foods to your baby at around 6 months of age. BREAST MILK PUMPING Pumping and storing breast milk allows you to ensure that your baby is exclusively fed your breast milk, even at times when you are unable to breastfeed. This is especially important if you are going back to work while you are still breastfeeding or when you are not able to be present during feedings. Your lactation consultant can give you guidelines on how long it is safe to store breast milk. A breast pump is a machine that allows you to pump milk from your breast into a sterile bottle. The pumped breast milk can then be stored in a refrigerator or freezer. Some breast pumps are operated by hand, while others use electricity. Ask your lactation consultant which type will work best for you. Breast pumps can be purchased, but some hospitals and breastfeeding support groups lease breast pumps on a monthly basis. A lactation consultant can teach you how to hand express breast milk, if you prefer not to use a pump. CARING FOR YOUR BREASTS WHILE YOU BREASTFEED Nipples can become dry, cracked, and sore while breastfeeding. The following recommendations can help keep your breasts moisturized and healthy:  Avoid using soap on your nipples.  Wear a supportive bra. Although not required, special nursing bras and tank tops are designed to allow access to your breasts for breastfeeding without taking off your entire bra or top. Avoid wearing underwire-style bras or extremely tight bras.  Air dry your nipples for 3-4minutes after each feeding.  Use only cotton bra pads to absorb leaked breast milk. Leaking of breast milk between feedings is normal.  Use lanolin on your nipples after breastfeeding. Lanolin helps to maintain your skin's normal moisture barrier. If you use  pure lanolin, you do not need to wash it off before feeding your baby again. Pure lanolin is not toxic to your baby. You may also hand express a few drops of breast milk and gently massage that milk into your nipples and allow the milk to air dry. In the first few weeks after giving birth, some women experience extremely full breasts (engorgement). Engorgement can make your breasts feel heavy, warm, and tender to the touch. Engorgement peaks within 3-5 days after you give birth. The following recommendations can help ease engorgement:  Completely empty your breasts while breastfeeding or pumping. You may want to start by applying warm, moist heat (in   the shower or with warm water-soaked hand towels) just before feeding or pumping. This increases circulation and helps the milk flow. If your baby does not completely empty your breasts while breastfeeding, pump any extra milk after he or she is finished.  Wear a snug bra (nursing or regular) or tank top for 1-2 days to signal your body to slightly decrease milk production.  Apply ice packs to your breasts, unless this is too uncomfortable for you.  Make sure that your baby is latched on and positioned properly while breastfeeding. If engorgement persists after 48 hours of following these recommendations, contact your health care provider or a lactation consultant. OVERALL HEALTH CARE RECOMMENDATIONS WHILE BREASTFEEDING  Eat healthy foods. Alternate between meals and snacks, eating 3 of each per day. Because what you eat affects your breast milk, some of the foods may make your baby more irritable than usual. Avoid eating these foods if you are sure that they are negatively affecting your baby.  Drink milk, fruit juice, and water to satisfy your thirst (about 10 glasses a day).  Rest often, relax, and continue to take your prenatal vitamins to prevent fatigue, stress, and anemia.  Continue breast self-awareness checks.  Avoid chewing and smoking  tobacco. Chemicals from cigarettes that pass into breast milk and exposure to secondhand smoke may harm your baby.  Avoid alcohol and drug use, including marijuana. Some medicines that may be harmful to your baby can pass through breast milk. It is important to ask your health care provider before taking any medicine, including all over-the-counter and prescription medicine as well as vitamin and herbal supplements. It is possible to become pregnant while breastfeeding. If birth control is desired, ask your health care provider about options that will be safe for your baby. SEEK MEDICAL CARE IF:  You feel like you want to stop breastfeeding or have become frustrated with breastfeeding.  You have painful breasts or nipples.  Your nipples are cracked or bleeding.  Your breasts are red, tender, or warm.  You have a swollen area on either breast.  You have a fever or chills.  You have nausea or vomiting.  You have drainage other than breast milk from your nipples.  Your breasts do not become full before feedings by the fifth day after you give birth.  You feel sad and depressed.  Your baby is too sleepy to eat well.  Your baby is having trouble sleeping.   Your baby is wetting less than 3 diapers in a 24-hour period.  Your baby has less than 3 stools in a 24-hour period.  Your baby's skin or the white part of his or her eyes becomes yellow.   Your baby is not gaining weight by 5 days of age. SEEK IMMEDIATE MEDICAL CARE IF:  Your baby is overly tired (lethargic) and does not want to wake up and feed.  Your baby develops an unexplained fever.   This information is not intended to replace advice given to you by your health care provider. Make sure you discuss any questions you have with your health care provider.   Document Released: 11/17/2005 Document Revised: 08/08/2015 Document Reviewed: 05/11/2013 Elsevier Interactive Patient Education 2016 [employer]  

## 2015-10-31 ENCOUNTER — Other Ambulatory Visit: Payer: Self-pay | Admitting: Family Medicine

## 2015-10-31 ENCOUNTER — Ambulatory Visit: Payer: Medicaid Other | Admitting: Diagnostic Neuroimaging

## 2015-10-31 LAB — CERVICOVAGINAL ANCILLARY ONLY
CHLAMYDIA, DNA PROBE: NEGATIVE
NEISSERIA GONORRHEA: NEGATIVE

## 2015-11-01 ENCOUNTER — Encounter: Payer: Self-pay | Admitting: Diagnostic Neuroimaging

## 2015-11-01 ENCOUNTER — Encounter: Payer: Self-pay | Admitting: Family Medicine

## 2015-11-08 ENCOUNTER — Ambulatory Visit (INDEPENDENT_AMBULATORY_CARE_PROVIDER_SITE_OTHER): Payer: Medicaid Other | Admitting: Student

## 2015-11-08 VITALS — BP 132/82 | HR 95 | Temp 98.5°F | Wt 237.7 lb

## 2015-11-08 DIAGNOSIS — Z3483 Encounter for supervision of other normal pregnancy, third trimester: Secondary | ICD-10-CM

## 2015-11-08 LAB — POCT FERNING: Ferning, POC: NEGATIVE
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
## 2015-11-08 NOTE — Patient Instructions (Signed)
Return in 1 week If you have regular contractions, loss of fluid like you broke your water or vaginal bleeding like a period, go to [facility] hospital right away [facility] [address], [place], [place] [address]

## 2015-11-16 ENCOUNTER — Telehealth (HOSPITAL_COMMUNITY): Payer: Self-pay | Admitting: *Deleted

## 2015-11-16 ENCOUNTER — Ambulatory Visit (INDEPENDENT_AMBULATORY_CARE_PROVIDER_SITE_OTHER): Payer: Medicaid Other | Admitting: Student

## 2015-11-16 VITALS — BP 129/82 | HR 96 | Temp 98.7°F | Wt 236.5 lb

## 2015-11-16 DIAGNOSIS — Z3493 Encounter for supervision of normal pregnancy, unspecified, third trimester: Secondary | ICD-10-CM

## 2015-11-16 NOTE — Telephone Encounter (Signed)
Preadmission screen  
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
## 2015-11-16 NOTE — Patient Instructions (Signed)
Follow up post partum If you have regular contractions, loss of fluid, vaginal bleeding go to [facility] hospital right away If you have questions or concerns, call the office Your induction has been scheduled for Sunday 12/8 at 8 AM at [facility] hospital

## 2015-11-18 ENCOUNTER — Inpatient Hospital Stay (HOSPITAL_COMMUNITY)
Admission: RE | Admit: 2015-11-18 | Discharge: 2015-11-20 | DRG: 774 | Disposition: A | Discharge status: Home / Self Care | Payer: Medicaid Other | Source: Ambulatory Visit | Attending: Obstetrics & Gynecology | Admitting: Obstetrics & Gynecology

## 2015-11-18 ENCOUNTER — Inpatient Hospital Stay (HOSPITAL_COMMUNITY): Payer: Medicaid Other | Admitting: Anesthesiology

## 2015-11-18 ENCOUNTER — Encounter (HOSPITAL_COMMUNITY): Payer: Self-pay

## 2015-11-18 VITALS — BP 128/59 | HR 91 | Temp 97.3°F | Resp 18 | Ht 62.0 in | Wt 236.0 lb

## 2015-11-18 DIAGNOSIS — Z6841 Body Mass Index (BMI) 40.0 and over, adult: Secondary | ICD-10-CM | POA: Diagnosis not present

## 2015-11-18 DIAGNOSIS — Z8249 Family history of ischemic heart disease and other diseases of the circulatory system: Secondary | ICD-10-CM

## 2015-11-18 DIAGNOSIS — O26893 Other specified pregnancy related conditions, third trimester: Secondary | ICD-10-CM | POA: Diagnosis present

## 2015-11-18 DIAGNOSIS — O480 Post-term pregnancy: Secondary | ICD-10-CM | POA: Diagnosis not present

## 2015-11-18 DIAGNOSIS — A6000 Herpesviral infection of urogenital system, unspecified: Secondary | ICD-10-CM | POA: Diagnosis not present

## 2015-11-18 DIAGNOSIS — Z6791 Unspecified blood type, Rh negative: Secondary | ICD-10-CM

## 2015-11-18 DIAGNOSIS — Z3A41 41 weeks gestation of pregnancy: Secondary | ICD-10-CM

## 2015-11-18 DIAGNOSIS — O9832 Other infections with a predominantly sexual mode of transmission complicating childbirth: Secondary | ICD-10-CM | POA: Diagnosis present

## 2015-11-18 DIAGNOSIS — O99214 Obesity complicating childbirth: Secondary | ICD-10-CM | POA: Diagnosis not present

## 2015-11-18 DIAGNOSIS — Z833 Family history of diabetes mellitus: Secondary | ICD-10-CM | POA: Diagnosis not present

## 2015-11-18 DIAGNOSIS — O99210 Obesity complicating pregnancy, unspecified trimester: Secondary | ICD-10-CM

## 2015-11-18 DIAGNOSIS — O34219 Maternal care for unspecified type scar from previous cesarean delivery: Secondary | ICD-10-CM

## 2015-11-18 DIAGNOSIS — A609 Anogenital herpesviral infection, unspecified: Secondary | ICD-10-CM | POA: Diagnosis present

## 2015-11-18 DIAGNOSIS — O26899 Other specified pregnancy related conditions, unspecified trimester: Secondary | ICD-10-CM

## 2015-11-18 DIAGNOSIS — O34211 Maternal care for low transverse scar from previous cesarean delivery: Secondary | ICD-10-CM | POA: Diagnosis present

## 2015-11-18 LAB — RPR: RPR Ser Ql: NONREACTIVE

## 2015-11-18 LAB — CBC
HEMATOCRIT: 33.2 % — AB (ref 36.0–46.0)
Hemoglobin: 11 g/dL — ABNORMAL LOW (ref 12.0–15.0)
MCH: 31.7 pg (ref 26.0–34.0)
MCHC: 33.1 g/dL (ref 30.0–36.0)
MCV: 95.7 fL (ref 78.0–100.0)
PLATELETS: 313 10*3/uL (ref 150–400)
RBC: 3.47 MIL/uL — ABNORMAL LOW (ref 3.87–5.11)
RDW: 13.5 % (ref 11.5–15.5)
WBC: 10 10*3/uL (ref 4.0–10.5)

## 2015-11-18 LAB — TYPE AND SCREEN
ABO/RH(D): A NEG
Antibody Screen: NEGATIVE

## 2015-11-18 MED ORDER — IBUPROFEN 600 MG PO TABS
600.0000 mg | ORAL_TABLET | Freq: Four times a day (QID) | ORAL | Status: DC
  Administered 2015-11-18 – 2015-11-20 (×7): 600 mg via ORAL
  Filled 2015-11-18 (×7): qty 1

## 2015-11-18 MED ORDER — EPHEDRINE 5 MG/ML INJ: 10.0000 mg | INTRAVENOUS | Status: DC | PRN

## 2015-11-18 MED ORDER — PROMETHAZINE HCL 25 MG/ML IJ SOLN
12.5000 mg | Freq: Once | INTRAMUSCULAR | Status: AC
  Administered 2015-11-18: 12.5 mg via INTRAVENOUS
  Filled 2015-11-18: qty 1

## 2015-11-18 MED ORDER — FENTANYL 2.5 MCG/ML BUPIVACAINE 1/10 % EPIDURAL INFUSION (WH - ANES)
14.0000 mL/h | INTRAMUSCULAR | Status: DC | PRN
  Administered 2015-11-18 (×2): 14 mL/h via EPIDURAL
  Filled 2015-11-18: qty 125

## 2015-11-18 MED ORDER — ONDANSETRON HCL 4 MG/2ML IJ SOLN: 4.0000 mg | Freq: Four times a day (QID) | INTRAMUSCULAR | Status: DC | PRN

## 2015-11-18 MED ORDER — SODIUM CHLORIDE 0.9 % IJ SOLN: 3.0000 mL | Freq: Two times a day (BID) | INTRAMUSCULAR | Status: DC

## 2015-11-18 MED ORDER — BENZOCAINE-MENTHOL 20-0.5 % EX AERO: 1.0000 "application " | INHALATION_SPRAY | CUTANEOUS | Status: DC | PRN

## 2015-11-18 MED ORDER — OXYCODONE-ACETAMINOPHEN 5-325 MG PO TABS: 2.0000 | ORAL_TABLET | ORAL | Status: DC | PRN

## 2015-11-18 MED ORDER — SENNOSIDES-DOCUSATE SODIUM 8.6-50 MG PO TABS
2.0000 | ORAL_TABLET | ORAL | Status: DC
  Administered 2015-11-18 – 2015-11-19 (×2): 2 via ORAL
  Filled 2015-11-18 (×2): qty 2

## 2015-11-18 MED ORDER — LACTATED RINGERS IV SOLN
500.0000 mL | INTRAVENOUS | Status: DC | PRN
  Administered 2015-11-18 (×2): 500 mL via INTRAVENOUS

## 2015-11-18 MED ORDER — DIPHENHYDRAMINE HCL 50 MG/ML IJ SOLN: 12.5000 mg | INTRAMUSCULAR | Status: DC | PRN

## 2015-11-18 MED ORDER — NALBUPHINE HCL 10 MG/ML IJ SOLN
10.0000 mg | Freq: Once | INTRAMUSCULAR | Status: AC
  Administered 2015-11-18: 10 mg via INTRAVENOUS
  Filled 2015-11-18: qty 1

## 2015-11-18 MED ORDER — ONDANSETRON HCL 4 MG/2ML IJ SOLN: 4.0000 mg | INTRAMUSCULAR | Status: DC | PRN

## 2015-11-18 MED ORDER — SIMETHICONE 80 MG PO CHEW: 80.0000 mg | CHEWABLE_TABLET | ORAL | Status: DC | PRN

## 2015-11-18 MED ORDER — LIDOCAINE HCL (PF) 1 % IJ SOLN
INTRAMUSCULAR | Status: DC | PRN
  Administered 2015-11-18: 7 mL via EPIDURAL
  Administered 2015-11-18: 8 mL via EPIDURAL

## 2015-11-18 MED ORDER — PHENYLEPHRINE 40 MCG/ML (10ML) SYRINGE FOR IV PUSH (FOR BLOOD PRESSURE SUPPORT)
80.0000 ug | PREFILLED_SYRINGE | INTRAVENOUS | Status: DC | PRN
Start: 2015-11-18 — End: 2015-11-18
  Filled 2015-11-18: qty 20

## 2015-11-18 MED ORDER — OXYTOCIN 40 UNITS IN LACTATED RINGERS INFUSION - SIMPLE MED
62.5000 mL/h | INTRAVENOUS | Status: DC | PRN
Start: 2015-11-18 — End: 2015-11-20

## 2015-11-18 MED ORDER — SODIUM CHLORIDE 0.9 % IJ SOLN: 3.0000 mL | INTRAMUSCULAR | Status: DC | PRN

## 2015-11-18 MED ORDER — SODIUM CHLORIDE 0.9 % IV SOLN: 250.0000 mL | INTRAVENOUS | Status: DC | PRN

## 2015-11-18 MED ORDER — TETANUS-DIPHTH-ACELL PERTUSSIS 5-2.5-18.5 LF-MCG/0.5 IM SUSP: 0.5000 mL | Freq: Once | INTRAMUSCULAR | Status: DC

## 2015-11-18 MED ORDER — TERBUTALINE SULFATE 1 MG/ML IJ SOLN
0.2500 mg | Freq: Once | INTRAMUSCULAR | Status: DC | PRN
  Filled 2015-11-18: qty 1

## 2015-11-18 MED ORDER — OXYCODONE-ACETAMINOPHEN 5-325 MG PO TABS
1.0000 | ORAL_TABLET | ORAL | Status: DC | PRN
  Administered 2015-11-19: 1 via ORAL
  Filled 2015-11-18 (×2): qty 1

## 2015-11-18 MED ORDER — PRENATAL MULTIVITAMIN CH
1.0000 | ORAL_TABLET | Freq: Every day | ORAL | Status: DC
  Administered 2015-11-19 – 2015-11-20 (×2): 1 via ORAL
  Filled 2015-11-18 (×2): qty 1

## 2015-11-18 MED ORDER — DIPHENHYDRAMINE HCL 25 MG PO CAPS: 25.0000 mg | ORAL_CAPSULE | Freq: Four times a day (QID) | ORAL | Status: DC | PRN

## 2015-11-18 MED ORDER — ONDANSETRON HCL 4 MG PO TABS: 4.0000 mg | ORAL_TABLET | ORAL | Status: DC | PRN

## 2015-11-18 MED ORDER — CITRIC ACID-SODIUM CITRATE 334-500 MG/5ML PO SOLN: 30.0000 mL | ORAL | Status: DC | PRN

## 2015-11-18 MED ORDER — OXYTOCIN BOLUS FROM INFUSION: 500.0000 mL | INTRAVENOUS | Status: DC

## 2015-11-18 MED ORDER — ACETAMINOPHEN 325 MG PO TABS
650.0000 mg | ORAL_TABLET | ORAL | Status: DC | PRN
  Filled 2015-11-18: qty 2

## 2015-11-18 MED ORDER — LIDOCAINE HCL (PF) 1 % IJ SOLN
30.0000 mL | INTRAMUSCULAR | Status: DC | PRN
Start: 2015-11-18 — End: 2015-11-20
  Filled 2015-11-18: qty 30

## 2015-11-18 MED ORDER — OXYTOCIN 40 UNITS IN LACTATED RINGERS INFUSION - SIMPLE MED
1.0000 m[IU]/min | INTRAVENOUS | Status: DC
  Administered 2015-11-18: 2 m[IU]/min via INTRAVENOUS
  Filled 2015-11-18: qty 1000

## 2015-11-18 MED ORDER — LACTATED RINGERS IV SOLN
INTRAVENOUS | Status: DC
  Administered 2015-11-18: 08:00:00 via INTRAVENOUS

## 2015-11-18 MED ORDER — OXYCODONE-ACETAMINOPHEN 5-325 MG PO TABS
1.0000 | ORAL_TABLET | ORAL | Status: DC | PRN
  Administered 2015-11-19: 1 via ORAL
  Filled 2015-11-18: qty 1

## 2015-11-18 MED ORDER — DIBUCAINE 1 % RE OINT: 1.0000 "application " | TOPICAL_OINTMENT | RECTAL | Status: DC | PRN

## 2015-11-18 MED ORDER — LANOLIN HYDROUS EX OINT: TOPICAL_OINTMENT | CUTANEOUS | Status: DC | PRN

## 2015-11-18 MED ORDER — OXYTOCIN 40 UNITS IN LACTATED RINGERS INFUSION - SIMPLE MED: 62.5000 mL/h | INTRAVENOUS | Status: DC

## 2015-11-18 MED ORDER — ZOLPIDEM TARTRATE 5 MG PO TABS: 5.0000 mg | ORAL_TABLET | Freq: Every evening | ORAL | Status: DC | PRN

## 2015-11-18 MED ORDER — WITCH HAZEL-GLYCERIN EX PADS: 1.0000 "application " | MEDICATED_PAD | CUTANEOUS | Status: DC | PRN

## 2015-11-18 MED ORDER — MEASLES, MUMPS & RUBELLA VAC ~~LOC~~ INJ: 0.5000 mL | INJECTION | Freq: Once | SUBCUTANEOUS | Status: DC

## 2015-11-18 MED ORDER — ACETAMINOPHEN 325 MG PO TABS
650.0000 mg | ORAL_TABLET | ORAL | Status: DC | PRN
  Administered 2015-11-18: 650 mg via ORAL

## 2015-11-18 NOTE — Anesthesia Procedure Notes (Signed)
Epidural Patient location during procedure: OB Start time: 11/18/2015 6:38 PM End time: 11/18/2015 6:44 PM  Staffing Anesthesiologist: Lyn Hollingshead Performed by: anesthesiologist   Preanesthetic Checklist Completed: patient identified, surgical consent, pre-op evaluation, timeout performed, IV checked, risks and benefits discussed and monitors and equipment checked  Epidural Patient position: sitting Prep: site prepped and draped and DuraPrep Patient monitoring: continuous pulse ox and blood pressure Approach: midline Location: L3-L4 Injection technique: LOR air  Needle:  Needle type: Tuohy  Needle gauge: 17 G Needle length: 9 cm and 9 Needle insertion depth: 7 cm Catheter type: closed end flexible Catheter size: 19 Gauge Catheter at skin depth: 12 cm Test dose: negative and Other  Assessment Sensory level: T10 Events: blood not aspirated, injection not painful, no injection resistance, negative IV test and no paresthesia  Additional Notes Reason for block:procedure for pain

## 2015-11-18 NOTE — Progress Notes (Signed)
[name] [name] is a 29 y.o. [id number] at [redacted]w[redacted]d by ultrasound admitted for induction of labor due to Post dates. Due date 12/8.  Subjective:   Objective: BP 111/67 mmHg  Pulse 93  Temp(Src) 98.3 F (36.8 C) (Oral)  Resp 16  Ht 5\' 2"  (1.575 m)  Wt 236 lb (107.049 kg)  BMI 43.15 kg/m2  SpO2 93%  LMP 02/01/2015      FHT:  FHR: 135 bpm, variability: moderate,  accelerations:  Present,  decelerations:  Present variable UC:   regular, every 2-3 minutes SVE:   Dilation: 6 Effacement (%): 80 Station: 0 Exam by:: Danny Lawless, RN  Labs: Lab Results  Component Value Date   WBC 10.0 11/18/2015   HGB 11.0* 11/18/2015   HCT 33.2* 11/18/2015   MCV 95.7 11/18/2015   PLT 313 11/18/2015    Assessment / Plan: Induction of labor due to postterm,  progressing well on pitocin  Labor: Progressing normally Preeclampsia:  no signs or symptoms of toxicity and intake and ouput balanced Fetal Wellbeing:  Category I Pain Control:  Epidural I/D:  n/a Anticipated MOD:  NSVD  Ludmila Ebarb DARLENE 11/18/2015, 6:45 PM

## 2015-11-18 NOTE — Progress Notes (Signed)
Wilodean Purohit is a 29 y.o. [id number] at [redacted]w[redacted]d by ultrasound admitted for induction of labor due to Post dates. Due date 12/8.  Subjective:   Objective: BP 114/63 mmHg  Pulse 94  Temp(Src) 98.7 F (37.1 C) (Oral)  Resp 18  Ht 5\' 2"  (1.575 m)  Wt 236 lb (107.049 kg)  BMI 43.15 kg/m2  LMP 02/01/2015      FHT:  FHR: 130's bpm, variability: moderate,  accelerations:  Present,  decelerations:  Absent UC:   regular, every 2-3 minutes, mild SVE:   Dilation: 3.5 Effacement (%): 80 Station: -1 Exam by:: Daiva Nakayama, CNM  Labs: Lab Results  Component Value Date   WBC 10.0 11/18/2015   HGB 11.0* 11/18/2015   HCT 33.2* 11/18/2015   MCV 95.7 11/18/2015   PLT 313 11/18/2015    Assessment / Plan: Induction of labor due to postterm,  progressing well on pitocin  Labor: Progressing normally Preeclampsia:  no signs or symptoms of toxicity and intake and ouput balanced Fetal Wellbeing:  Category I Pain Control:  Labor support without medications I/D:  n/a Anticipated MOD:  NSVD  LAWSON, MARIE DARLENE 11/18/2015, 12:34 PM

## 2015-11-18 NOTE — Progress Notes (Signed)
Acknowledged order for social work consult.  Met briefly with mother who is currently in labor.  Aunt and grandmother was present.  Introduced self and informed her that a Education officer, museum will see her after she delivers.

## 2015-11-18 NOTE — Progress Notes (Signed)
Code apgar called infant's heart rate 40, color is dusky and tone is floppy.  Chest compressions started.

## 2015-11-18 NOTE — Anesthesia Preprocedure Evaluation (Signed)
Anesthesia Evaluation  Patient identified by MRN, date of birth, ID band Patient awake    Reviewed: Allergy & Precautions, H&P , NPO status , Patient's Chart, lab work & pertinent test results  Airway Mallampati: II  TM Distance: >3 FB Neck ROM: full    Dental no notable dental hx.    Pulmonary neg pulmonary ROS,    Pulmonary exam normal        Cardiovascular negative cardio ROS Normal cardiovascular exam     Neuro/Psych    GI/Hepatic negative GI ROS, Neg liver ROS,   Endo/Other  Morbid obesity  Renal/GU negative Renal ROS     Musculoskeletal   Abdominal (+) + obese,   Peds  Hematology negative hematology ROS (+)   Anesthesia Other Findings   Reproductive/Obstetrics (+) Pregnancy                             Anesthesia Physical Anesthesia Plan  ASA: III  Anesthesia Plan: Epidural   Post-op Pain Management:    Induction:   Airway Management Planned:   Additional Equipment:   Intra-op Plan:   Post-operative Plan:   Informed Consent: I have reviewed the patients History and Physical, chart, labs and discussed the procedure including the risks, benefits and alternatives for the proposed anesthesia with the patient or authorized representative who has indicated his/her understanding and acceptance.     Plan Discussed with:   Anesthesia Plan Comments:         Anesthesia Quick Evaluation

## 2015-11-18 NOTE — Progress Notes (Signed)
Patient ID: [name] [name], female   DOB: [birth year], 29 y.o.   MRN: [id number] S: pain managed by iv pain med. O: SVE 3-4/70/-2 AROM lg amt clear fluid. IUPC place. FHR pattern stable A: IOL for post dates, VBAC x 3 P: continue present POC

## 2015-11-18 NOTE — H&P (Signed)
[name] [name] is a 29 y.o. female presenting for IOL at 41.3 wks.Marland Kitchen VBAC x 3 successful. GBS neg Maternal Medical History:  Reason for admission: IOL at 41.3  Fetal activity: Perceived fetal activity is normal.   Last perceived fetal movement was within the past hour.    Prenatal complications: no prenatal complications Prenatal Complications - Diabetes: none.    OB History    Gravida Para Term Preterm AB TAB SAB Ectopic Multiple Living   5 4 4       4      Past Medical History  Diagnosis Date  . Headache(784.0)   . Urinary tract infection   . Ovarian cyst   . Abnormal Pap smear     f/u was normal  . Infection     trich, chlamydia, gonorrhea  . Depression     doind good now   Past Surgical History  Procedure Laterality Date  . Implanon removal 08/2012  08/2012  . Cesarean section    . Tonsillectomy    . Wisdom tooth extraction     Family History: family history includes Asthma in her brother; Cancer in her maternal grandmother and maternal uncle; Diabetes in her maternal grandmother; Hypertension in her maternal grandmother and mother. There is no history of Other. Social History:  reports that she has never smoked. She has never used smokeless tobacco. She reports that she drinks alcohol. She reports that she does not use illicit drugs.   Prenatal Transfer Tool  Maternal Diabetes: No Genetic Screening: Normal Maternal Ultrasounds/Referrals: Normal Fetal Ultrasounds or other Referrals:  None Maternal Substance Abuse:  No Significant Maternal Medications:  None Significant Maternal Lab Results:  None Other Comments:  None  Review of Systems  Constitutional: Negative.   HENT: Negative.   Eyes: Negative.   Respiratory: Negative.   Cardiovascular: Negative.   Gastrointestinal: Negative.   Genitourinary: Negative.   Musculoskeletal: Negative.   Skin: Negative.   Neurological: Negative.   Endo/Heme/Allergies: Negative.   Psychiatric/Behavioral: Negative.      Dilation: 3.5 Effacement (%): 80 Station: -1 Exam by:: Daiva Nakayama, CNM Blood pressure 142/61, pulse 118, temperature 98.7 F (37.1 C), temperature source Oral, resp. rate 18, height 5\' 2"  (1.575 m), weight 236 lb (107.049 kg), last menstrual period 02/01/2015. Maternal Exam:  Abdomen: Patient reports no abdominal tenderness. Surgical scars: low transverse.   Fetal presentation: vertex  Introitus: Normal vulva. Normal vagina.  Amniotic fluid character: not assessed.  Pelvis: adequate for delivery.   Cervix: Cervix evaluated by digital exam.     Fetal Exam Fetal Monitor Review: Mode: ultrasound.   Variability: moderate (6-25 bpm).   Pattern: accelerations present.    Fetal State Assessment: Category I - tracings are normal.     Physical Exam  Nursing note and vitals reviewed. Constitutional: She is oriented to person, place, and time. She appears well-developed and well-nourished.  HENT:  Head: Normocephalic.  Eyes: Pupils are equal, round, and reactive to light.  Neck: Normal range of motion.  Cardiovascular: Normal rate and intact distal pulses.   Respiratory: Effort normal and breath sounds normal.  GI: Soft.  Genitourinary: Vagina normal and uterus normal.  Musculoskeletal: Normal range of motion.  Neurological: She is alert and oriented to person, place, and time. She has normal reflexes.  Skin: Skin is warm and dry.  Psychiatric: She has a normal mood and affect. Her behavior is normal. Judgment and thought content normal.    Prenatal labs: ABO, Rh: A/NEG/-- (04/25 1422) Antibody: NEG (09/26  [id number]) Rubella: 1.92 (04/25 1422) RPR: NON REAC (09/26 0954)  HBsAg: NEGATIVE (04/25 1422)  HIV: NONREACTIVE (09/26 0954)  GBS: NOT DETECTED (11/09 0954)   Assessment/Plan: SVE 3-4/80/-1 will start pit induction of labor   LAWSON, MARIE DARLENE 11/18/2015, 8:38 AM

## 2015-11-19 MED ORDER — SODIUM CHLORIDE 0.9 % IJ SOLN
3.0000 mL | Freq: Two times a day (BID) | INTRAMUSCULAR | Status: DC
  Administered 2015-11-19: 3 mL via INTRAVENOUS

## 2015-11-19 MED ORDER — MEDROXYPROGESTERONE ACETATE 150 MG/ML IM SUSP: 150.0000 mg | Freq: Once | INTRAMUSCULAR | Status: DC

## 2015-11-19 NOTE — Progress Notes (Cosign Needed)
Post Partum Day 1  Subjective:  [name] [name] is a 29 y.o. [id number] [redacted]w[redacted]d s/p VBAC.  No acute events overnight.  Pt denies problems with ambulating, voiding or po intake.  She denies nausea or vomiting.  Pain is well controlled.  She has had flatus. She has not had bowel movement.  Lochia Small.  Plan for birth control is condoms.  Method of Feeding: Breast and Bottle.  Objective: BP 119/47 mmHg  Pulse 80  Temp(Src) 98 F (36.7 C) (Oral)  Resp 16  Ht 5\' 2"  (1.575 m)  Wt 236 lb (107.049 kg)  BMI 43.15 kg/m2  SpO2 100%  LMP 02/01/2015  Breastfeeding? Unknown  Physical Exam:  General: alert, cooperative and no distress Lochia:normal flow Chest: CTAB Heart: RRR no m/r/g Abdomen: +BS, soft, nontender, fundus firm at/below umbilicus Uterine Fundus: firm,  DVT Evaluation: No evidence of DVT seen on physical exam. Extremities: No lower extremity edema   Recent Labs  11/18/15 0810  HGB 11.0*  HCT 33.2*    Assessment/Plan:  ASSESSMENT: [name] [name] is a 29 y.o. [id number] [redacted]w[redacted]d ppd #1 s/p VBAC doing well.   Plan for discharge tomorrow   LOS: 1 day   [place] 11/19/2015, 7:27 AM

## 2015-11-19 NOTE — Lactation Note (Signed)
This note was copied from the chart of [name] Aabriella Bendon. Lactation Consultation Note Follow up visit at 23 hours of age.  MOm requests assist with latching.  Mom reports baby wont latch.  Mom reports baby didn't get much of previous bottle it mostly spilled out of her mouth.  Chart indicated 5mls.  Advised mom to latch baby for feedings and ask for assist as needed.  Advised mom to not use artificial bottle nipples until breastfeeding is well established.  Encouraged mom to limit to 5-32mls if she decides to continue supplementing.  Mom has easily expressed colostrum.  Assisted with cross cradle hold.  Baby is sleepy, but when stimulated latched well with wide mouth and flanged lips.  Stimulation needed to maintain feeding.  Baby continues with good latch.  Mom denies pain.      Patient Name: [name] [name] [id number] Date: 11/19/2015 Reason for consult: Follow-up assessment   Maternal Data    Feeding Feeding Type: Breast Fed Length of feed:  (observed several minutes)  LATCH Score/Interventions Latch: Grasps breast easily, tongue down, lips flanged, rhythmical sucking.  Audible Swallowing: A few with stimulation  Type of Nipple: Everted at rest and after stimulation  Comfort (Breast/Nipple): Soft / non-tender     Hold (Positioning): Assistance needed to correctly position infant at breast and maintain latch. Intervention(s): Breastfeeding basics reviewed;Support Pillows;Position options;Skin to skin  LATCH Score: 8  Lactation Tools Discussed/Used     Consult Status Consult Status: Follow-up Date: 11/20/15 Follow-up type: In-patient    Ly Wass, Justine Null 11/19/2015, 6:15 PM
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
## 2015-11-19 NOTE — Anesthesia Postprocedure Evaluation (Signed)
Anesthesia Post Note  Patient: [name] [name]  Procedure(s) Performed: * No procedures listed *  Patient location during evaluation: Mother Baby Anesthesia Type: Epidural Level of consciousness: awake, awake and alert, oriented and patient cooperative Pain management: pain level controlled Vital Signs Assessment: post-procedure vital signs reviewed and stable Respiratory status: spontaneous breathing, nonlabored ventilation and respiratory function stable Cardiovascular status: stable Postop Assessment: no headache, no backache, patient able to bend at knees and no signs of nausea or vomiting Anesthetic complications: no    Last Vitals:  Filed Vitals:   11/19/15 0200 11/19/15 0632  BP: 122/88 119/47  Pulse: 86 80  Temp: 36.7 C 36.7 C  Resp: 16 16    Last Pain:  Filed Vitals:   11/19/15 0633  PainSc: 0-No pain                 Deserae Jennings L

## 2015-11-19 NOTE — Lactation Note (Signed)
This note was copied from the chart of [name] Skyleigh Jovel. Lactation Consultation Note Experienced BF mom BF her 1st and 2nd children for 6 months each, then her 3rd child 23 months, and her 4th child who is 2 yrs. Old now just stopped BF 2 months ago. Mom currently BF newborn in side lying position. Referred to Baby and Me Book in Breastfeeding section Pg. 22-23 for position options and Proper latch demonstration. Educated about newborn behavior and STS. [place] brochure given w/resources, support groups and [place] services. Patient Name: [name] [name] [id number] Date: 11/19/2015 Reason for consult: Initial assessment   Maternal Data Has patient been taught Hand Expression?: Yes  Feeding Feeding Type: Breast Fed Length of feed: 20 min  LATCH Score/Interventions Latch: Grasps breast easily, tongue down, lips flanged, rhythmical sucking.  Audible Swallowing: Spontaneous and intermittent  Type of Nipple: Everted at rest and after stimulation  Comfort (Breast/Nipple): Soft / non-tender     Hold (Positioning): No assistance needed to correctly position infant at breast.  LATCH Score: 10  Lactation Tools Discussed/Used WIC Program: Yes   Consult Status Consult Status: PRN Date: 11/20/15 Follow-up type: In-patient    Theodoro Kalata 11/19/2015, 6:29 AM

## 2015-11-20 MED ORDER — IBUPROFEN 600 MG PO TABS
600.0000 mg | ORAL_TABLET | Freq: Four times a day (QID) | ORAL | Status: DC
Start: 2015-11-20 — End: 2016-09-21
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
## 2015-11-21 ENCOUNTER — Ambulatory Visit: Payer: Self-pay

## 2015-11-21 NOTE — Lactation Note (Signed)
This note was copied from the chart of [name] [name]. Lactation Consultation Note: Mom is just finished  feeding formula- states she was told to supplement after feedings. Reports he nursed for 15 min. Reports baby is latching pretty well but has some pain because he won't open his mouth wide enough. Has some positional stripes noted on both nipples . Baby content- encouraged to call RN or LC at next feeding for assist with deeper latch. Experienced BF mom reports she just stopped breast feeding her 27 year old about 3 months ago. No questions at present.  Patient Name: [name] Takyia Czechowski [id number] Date: 11/21/2015 Reason for consult: Follow-up assessment   Maternal Data Formula Feeding for Exclusion: Yes Reason for exclusion: Mother's choice to formula and breast feed on admission Does the patient have breastfeeding experience prior to this delivery?: Yes  Feeding    LATCH Score/Interventions                      Lactation Tools Discussed/Used     Consult Status Consult Status: Follow-up Date: 11/22/15 Follow-up type: In-patient    Truddie Crumble 11/21/2015, 4:08 PM

## 2015-11-21 NOTE — Progress Notes (Signed)
Discussed car seat, safe sleep, and warning signs for baby. Reviewed Baby & Me folder for home care.

## 2015-12-24 ENCOUNTER — Other Ambulatory Visit: Payer: Self-pay | Admitting: *Deleted

## 2015-12-24 MED ORDER — PNV PRENATAL PLUS MULTIVITAMIN 27-1 MG PO TABS: 1.0000 | ORAL_TABLET | Freq: Every day | ORAL | Status: DC

## 2015-12-28 ENCOUNTER — Ambulatory Visit: Payer: Medicaid Other | Admitting: Family Medicine

## 2016-02-08 ENCOUNTER — Ambulatory Visit: Payer: Medicaid Other | Admitting: Family Medicine

## 2016-02-11 ENCOUNTER — Ambulatory Visit: Payer: Medicaid Other | Admitting: Student

## 2016-02-22 ENCOUNTER — Ambulatory Visit: Payer: Medicaid Other | Admitting: Student

## 2016-02-26 ENCOUNTER — Other Ambulatory Visit (HOSPITAL_COMMUNITY)
Admission: RE | Admit: 2016-02-26 | Discharge: 2016-02-26 | Disposition: A | Discharge status: Home / Self Care | Payer: Medicaid Other | Source: Ambulatory Visit | Attending: Family Medicine | Admitting: Family Medicine

## 2016-02-26 ENCOUNTER — Encounter: Payer: Self-pay | Admitting: Student

## 2016-02-26 ENCOUNTER — Ambulatory Visit (INDEPENDENT_AMBULATORY_CARE_PROVIDER_SITE_OTHER): Payer: Medicaid Other | Admitting: Student

## 2016-02-26 VITALS — BP 145/81 | HR 80 | Temp 98.2°F | Wt 233.3 lb

## 2016-02-26 DIAGNOSIS — Z3009 Encounter for other general counseling and advice on contraception: Secondary | ICD-10-CM

## 2016-02-26 DIAGNOSIS — Z309 Encounter for contraceptive management, unspecified: Secondary | ICD-10-CM | POA: Insufficient documentation

## 2016-02-26 DIAGNOSIS — F331 Major depressive disorder, recurrent, moderate: Secondary | ICD-10-CM | POA: Diagnosis not present

## 2016-02-26 DIAGNOSIS — R030 Elevated blood-pressure reading, without diagnosis of hypertension: Secondary | ICD-10-CM

## 2016-02-26 DIAGNOSIS — Z113 Encounter for screening for infections with a predominantly sexual mode of transmission: Secondary | ICD-10-CM | POA: Diagnosis present

## 2016-02-26 DIAGNOSIS — IMO0001 Reserved for inherently not codable concepts without codable children: Secondary | ICD-10-CM

## 2016-02-26 LAB — POCT WET PREP (WET MOUNT): CLUE CELLS WET PREP WHIFF POC: NEGATIVE

## 2016-02-26 LAB — POCT URINE PREGNANCY: Preg Test, Ur: NEGATIVE
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
## 2016-02-26 NOTE — Assessment & Plan Note (Signed)
PH Q9-  15.  Concern for depression , however patient  Refuses to start pharmacotherapy.  Patient counseled on  behavioral health services offered in this office and she will think about obtaining this - continue to follow closely for mood -

## 2016-02-26 NOTE — Addendum Note (Signed)
Addended by: Tommas Olp B on: 02/26/2016 12:03 PM   Modules accepted: Orders

## 2016-02-26 NOTE — Assessment & Plan Note (Signed)
Patient denies desire to start contraception. Pregnancy test negative the office. -  Contraceptive  Information handout given
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
## 2016-02-26 NOTE — Patient Instructions (Addendum)
Follow with primary care provider as needed Please take Zoloft as needed for mood If you have questions or concerns please call the office at [phone].

## 2016-02-26 NOTE — Assessment & Plan Note (Addendum)
Mild blood pressure elevation with no signs or symptoms pregnancy at. Additionally patient is 3-1/2 months postpartum making preeclampsia less likely.  Patient has had elevated pressures in this range in 2015. There is concern for development of essential hypertension. -  Will continue to follow with PCP -  At next visit consider initiation of pharmacotherapy if bood pressure still elevated.

## 2016-02-27 LAB — CERVICOVAGINAL ANCILLARY ONLY
CHLAMYDIA, DNA PROBE: NEGATIVE
Neisseria Gonorrhea: NEGATIVE

## 2016-03-13 ENCOUNTER — Ambulatory Visit: Payer: Medicaid Other | Admitting: Diagnostic Neuroimaging

## 2016-03-13 ENCOUNTER — Telehealth: Payer: Self-pay | Admitting: *Deleted

## 2016-03-13 NOTE — Telephone Encounter (Signed)
no showed 6 month f/u

## 2016-03-14 ENCOUNTER — Encounter: Payer: Self-pay | Admitting: Diagnostic Neuroimaging

## 2016-08-28 NOTE — Progress Notes (Deleted)
   [facility] Phone: [phone]   Date of Visit: 08/29/2016   HPI:  [name] [name] is a 30 y.o. female presenting to clinic today for same day appointment. PCP: Kathrine Cords, MD Concerns today include:    ROS: See HPI.  [place]:  PMH: Genital Herpes  PHYSICAL EXAM: There were no vitals taken for this visit. Gen: *** HEENT: *** Heart: *** Lungs: *** Neuro: *** Ext: ***  ASSESSMENT/PLAN:  Health maintenance:  -***  No problem-specific Assessment & Plan notes found for this encounter.  FOLLOW UP: Follow up in *** for ***  Smiley Houseman, MD PGY [facility]

## 2016-08-29 ENCOUNTER — Ambulatory Visit: Payer: Medicaid Other | Admitting: Internal Medicine

## 2016-09-21 ENCOUNTER — Encounter (HOSPITAL_COMMUNITY): Payer: Self-pay | Admitting: *Deleted

## 2016-09-21 ENCOUNTER — Inpatient Hospital Stay (HOSPITAL_COMMUNITY)
Admission: AD | Admit: 2016-09-21 | Discharge: 2016-09-21 | Disposition: A | Discharge status: Home / Self Care | Payer: Medicaid Other | Source: Ambulatory Visit | Attending: Obstetrics and Gynecology | Admitting: Obstetrics and Gynecology

## 2016-09-21 DIAGNOSIS — Z87440 Personal history of urinary (tract) infections: Secondary | ICD-10-CM | POA: Insufficient documentation

## 2016-09-21 DIAGNOSIS — R300 Dysuria: Secondary | ICD-10-CM

## 2016-09-21 DIAGNOSIS — N898 Other specified noninflammatory disorders of vagina: Secondary | ICD-10-CM

## 2016-09-21 DIAGNOSIS — R51 Headache: Secondary | ICD-10-CM | POA: Insufficient documentation

## 2016-09-21 DIAGNOSIS — N83209 Unspecified ovarian cyst, unspecified side: Secondary | ICD-10-CM | POA: Diagnosis not present

## 2016-09-21 DIAGNOSIS — F329 Major depressive disorder, single episode, unspecified: Secondary | ICD-10-CM | POA: Insufficient documentation

## 2016-09-21 LAB — URINE MICROSCOPIC-ADD ON

## 2016-09-21 LAB — WET PREP, GENITAL
Clue Cells Wet Prep HPF POC: NONE SEEN
SPERM: NONE SEEN
TRICH WET PREP: NONE SEEN
Yeast Wet Prep HPF POC: NONE SEEN

## 2016-09-21 LAB — URINALYSIS, ROUTINE W REFLEX MICROSCOPIC
Bilirubin Urine: NEGATIVE
GLUCOSE, UA: NEGATIVE mg/dL
KETONES UR: NEGATIVE mg/dL
Nitrite: NEGATIVE
PH: 6.5 (ref 5.0–8.0)
PROTEIN: NEGATIVE mg/dL
Specific Gravity, Urine: 1.02 (ref 1.005–1.030)

## 2016-09-21 LAB — POCT PREGNANCY, URINE: Preg Test, Ur: NEGATIVE

## 2016-09-21 MED ORDER — SULFAMETHOXAZOLE-TRIMETHOPRIM 800-160 MG PO TABS: 1.0000 | ORAL_TABLET | Freq: Two times a day (BID) | ORAL | 0 refills | Status: DC

## 2016-09-21 NOTE — MAU Provider Note (Signed)
Chief Complaint:  vaginal odor and burning with urinating   First Provider Initiated Contact with Patient 09/21/16 1630       HPI: [name] [name] is a 30 y.o. [id number] who presents to maternity admissions reporting dysuria and vaginal odor.  Has some lower abdominal cramping.  Thinks she might be pregnant since her period was shorter than usual last month. Not using any contraception but does not want to be pregnant. She reports no vaginal bleeding, vaginal itching/burning, h/a, dizziness, n/v, or fever/chills.    Dysuria   This is a new problem. The current episode started yesterday. The problem has been unchanged. The quality of the pain is described as burning. The pain is mild. There has been no fever. She is sexually active. There is no history of pyelonephritis. Associated symptoms include a discharge and a possible pregnancy. Pertinent negatives include no chills, frequency, hematuria, nausea, sweats or vomiting. She has tried nothing for the symptoms. There is no history of recurrent UTIs.   RN Note: Pt states she has a vaginal odor, burning when she pees, and pain in her lower abdomen and her sides.  Pt states she would like a pregnancy test and has not taken one at home.  Past Medical History: Past Medical History:  Diagnosis Date  . Abnormal Pap smear    f/u was normal  . Depression    doind good now  . Headache(784.0)   . Infection    trich, chlamydia, gonorrhea  . Ovarian cyst   . Urinary tract infection     Past obstetric history: OB History  Gravida Para Term Preterm AB Living  5 5 5     5   SAB TAB Ectopic Multiple Live Births        0 5    # Outcome Date GA Lbr Len/2nd Weight Sex Delivery Anes PTL Lv  5 Term 11/18/15 [redacted]w[redacted]d 02:35 / 00:15 7 lb 8.8 oz (3.425 kg) M Vag-Spont EPI  LIV  4 Term 07/13/13 [redacted]w[redacted]d 02:11 7 lb 11.5 oz (3.501 kg) M VBAC EPI  LIV     Birth Comments: extra digits on hands bilaterally  3 Term 11/22/10 [redacted]w[redacted]d  7 lb 5 oz (3.317 kg) F VBAC  N LIV      Birth Comments: vbac  2 Term 09/23/09 [redacted]w[redacted]d  7 lb 11 oz (3.487 kg) M VBAC  N LIV     Birth Comments: VBAC  1 Term 10/27/06 [redacted]w[redacted]d  7 lb 9 oz (3.43 kg) F CS-LTranv  N DEC     Birth Comments: fetal decels      Past Surgical History: Past Surgical History:  Procedure Laterality Date  . CESAREAN SECTION    . implanon removal 08/2012  08/2012  . TONSILLECTOMY    . WISDOM TOOTH EXTRACTION      Family History: Family History  Problem Relation Age of Onset  . Hypertension Mother   . Asthma Brother   . Cancer Maternal Uncle     lung  . Diabetes Maternal Grandmother   . Cancer Maternal Grandmother     thyroid  . Hypertension Maternal Grandmother   . Other Neg Hx     Social History: Social History  Substance Use Topics  . Smoking status: Never Smoker  . Smokeless tobacco: Never Used     Comment: rare  . Alcohol use Yes     Comment: occ when pregnant  LAST DRANK -2014    Allergies: No Known Allergies  Meds:  Prescriptions Prior to  Admission  Medication Sig Dispense Refill Last Dose  . Biotin 1000 MCG tablet Take 1,000 mcg by mouth 2 (two) times daily.   09/20/2016 at Unknown time  . ibuprofen (ADVIL,MOTRIN) 600 MG tablet Take 600 mg by mouth every 6 (six) hours as needed for headache, mild pain or moderate pain.   09/20/2016 at 2200    I have reviewed patient's Past Medical Hx, Surgical Hx, Family Hx, Social Hx, medications and allergies.  ROS:  Review of Systems  Constitutional: Negative for chills.  Gastrointestinal: Negative for nausea and vomiting.  Genitourinary: Positive for dysuria. Negative for frequency and hematuria.   Other systems negative   Physical Exam  Patient Vitals for the past 24 hrs:  BP Temp Temp src Pulse Resp SpO2  09/21/16 1625 148/78 98.3 F (36.8 C) Oral 77 18 100 %   Constitutional: Well-developed, well-nourished female in no acute distress.  Cardiovascular: normal rate and rhythm, no ectopy audible, S1 & S2 heard, no  murmur Respiratory: normal effort, no distress. Lungs CTAB with no wheezes or crackles GI: Abd soft, non-tender.  Nondistended.  No rebound, No guarding.  Bowel Sounds audible  MS: Extremities nontender, no edema, normal ROM Neurologic: Alert and oriented x 4.   Grossly nonfocal. GU: Neg CVAT. Skin:  Warm and Dry Psych:  Affect appropriate.  PELVIC EXAM: Cervix pink, visually closed, without lesion, moderate thick white creamy discharge, vaginal walls and external genitalia normal Bimanual exam: Cervix firm, anterior, neg CMT, uterus nontender, nonenlarged, adnexa without tenderness, enlargement, or mass    Labs: Results for orders placed or performed during the hospital encounter of 09/21/16 (from the past 24 hour(s))  Urinalysis, Routine w reflex microscopic (not at [facility])     Status: Abnormal   Collection Time: 09/21/16  4:07 PM  Result Value Ref Range   Color, Urine YELLOW YELLOW   APPearance CLEAR CLEAR   Specific Gravity, Urine 1.020 1.005 - 1.030   pH 6.5 5.0 - 8.0   Glucose, UA NEGATIVE NEGATIVE mg/dL   Hgb urine dipstick TRACE (A) NEGATIVE   Bilirubin Urine NEGATIVE NEGATIVE   Ketones, ur NEGATIVE NEGATIVE mg/dL   Protein, ur NEGATIVE NEGATIVE mg/dL   Nitrite NEGATIVE NEGATIVE   Leukocytes, UA SMALL (A) NEGATIVE  Urine microscopic-add on     Status: Abnormal   Collection Time: 09/21/16  4:07 PM  Result Value Ref Range   Squamous Epithelial / LPF 6-30 (A) NONE SEEN   WBC, UA 6-30 0 - 5 WBC/hpf   RBC / HPF 0-5 0 - 5 RBC/hpf   Bacteria, UA MANY (A) NONE SEEN   Urine-Other MUCOUS PRESENT   Pregnancy, urine POC     Status: None   Collection Time: 09/21/16  4:17 PM  Result Value Ref Range   Preg Test, Ur NEGATIVE NEGATIVE  Wet prep, genital     Status: Abnormal   Collection Time: 09/21/16  4:30 PM  Result Value Ref Range   Yeast Wet Prep HPF POC NONE SEEN NONE SEEN   Trich, Wet Prep NONE SEEN NONE SEEN   Clue Cells Wet Prep HPF POC NONE SEEN NONE SEEN   WBC, Wet Prep  HPF POC MODERATE (A) NONE SEEN   Sperm NONE SEEN    --/--/A NEG (12/18 0810)  Imaging:  No results found.  MAU Course/MDM: I have ordered labs as follows: UA, UPT, Wet prep, GC/Chlamydia Imaging ordered: none Results reviewed.  Since she has hematuria and some leukocytes, will culture urine and treat with  3 day course of Septra DS.   Long discussion of safe sex and contraception choices. Has family doctor [facility].  Will follow there Pt stable at time of discharge.  Assessment: 1. Dysuria   2. Vaginal discharge     Plan: Discharge home Recommend Push po fluids Safe sex practices Rx sent for Septra DS for possible UTI  Follow-up Information    [facility]. Schedule an appointment as soon as possible for a visit today.   Contact information: [place] [phone]          Encouraged to return here or to other Urgent Care/ED if she develops worsening of symptoms, increase in pain, fever, or other concerning symptoms.   Hansel Feinstein CNM, MSN Certified Nurse-Midwife 09/21/2016 5:09 PM

## 2016-09-21 NOTE — Discharge Instructions (Signed)
Dysuria Dysuria is pain or discomfort while urinating. The pain or discomfort may be felt in the tube that carries urine out of the bladder (urethra) or in the surrounding tissue of the genitals. The pain may also be felt in the groin area, lower abdomen, and lower back. You may have to urinate frequently or have the sudden feeling that you have to urinate (urgency). Dysuria can affect both men and women, but is more common in women. Dysuria can be caused by many different things, including:  Urinary tract infection in women.  Infection of the kidney or bladder.  Kidney stones or bladder stones.  Certain sexually transmitted infections (STIs), such as chlamydia.  Dehydration.  Inflammation of the vagina.  Use of certain medicines.  Use of certain soaps or scented products that cause irritation. HOME CARE INSTRUCTIONS Watch your dysuria for any changes. The following actions may help to reduce any discomfort you are feeling:  Drink enough fluid to keep your urine clear or pale yellow.  Empty your bladder often. Avoid holding urine for long periods of time.  After a bowel movement or urination, women should cleanse from front to back, using each tissue only once.  Empty your bladder after sexual intercourse.  Take medicines only as directed by your health care provider.  If you were prescribed an antibiotic medicine, finish it all even if you start to feel better.  Avoid caffeine, tea, and alcohol. They can irritate the bladder and make dysuria worse. In men, alcohol may irritate the prostate.  Keep all follow-up visits as directed by your health care provider. This is important.  If you had any tests done to find the cause of dysuria, it is your responsibility to obtain your test results. Ask the lab or department performing the test when and how you will get your results. Talk with your health care provider if you have any questions about your results. SEEK MEDICAL CARE  IF:  You develop pain in your back or sides.  You have a fever.  You have nausea or vomiting.  You have blood in your urine.  You are not urinating as often as you usually do. SEEK IMMEDIATE MEDICAL CARE IF:  You pain is severe and not relieved with medicines.  You are unable to hold down any fluids.  You or someone else notices a change in your mental function.  You have a rapid heartbeat at rest.  You have shaking or chills.  You feel extremely weak.   This information is not intended to replace advice given to you by your health care provider. Make sure you discuss any questions you have with your health care provider.   Document Released: 08/15/2004 Document Revised: 12/08/2014 Document Reviewed: 07/13/2014 Elsevier Interactive Patient Education [employer]. Contraception Choices Contraception (birth control) is the use of any methods or devices to prevent pregnancy. Below are some methods to help avoid pregnancy. HORMONAL METHODS   Contraceptive implant. This is a thin, plastic tube containing progesterone hormone. It does not contain estrogen hormone. Your health care provider inserts the tube in the inner part of the upper arm. The tube can remain in place for up to 3 years. After 3 years, the implant must be removed. The implant prevents the ovaries from releasing an egg (ovulation), thickens the cervical mucus to prevent sperm from entering the uterus, and thins the lining of the inside of the uterus.  Progesterone-only injections. These injections are given every 3 months by your health care  provider to prevent pregnancy. This synthetic progesterone hormone stops the ovaries from releasing eggs. It also thickens cervical mucus and changes the uterine lining. This makes it harder for sperm to survive in the uterus.  Birth control pills. These pills contain estrogen and progesterone hormone. They work by preventing the ovaries from releasing eggs (ovulation). They  also cause the cervical mucus to thicken, preventing the sperm from entering the uterus. Birth control pills are prescribed by a health care provider.Birth control pills can also be used to treat heavy periods.  Minipill. This type of birth control pill contains only the progesterone hormone. They are taken every day of each month and must be prescribed by your health care provider.  Birth control patch. The patch contains hormones similar to those in birth control pills. It must be changed once a week and is prescribed by a health care provider.  Vaginal ring. The ring contains hormones similar to those in birth control pills. It is left in the vagina for 3 weeks, removed for 1 week, and then a new one is put back in place. The patient must be comfortable inserting and removing the ring from the vagina.A health care provider's prescription is necessary.  Emergency contraception. Emergency contraceptives prevent pregnancy after unprotected sexual intercourse. This pill can be taken right after sex or up to 5 days after unprotected sex. It is most effective the sooner you take the pills after having sexual intercourse. Most emergency contraceptive pills are available without a prescription. Check with your pharmacist. Do not use emergency contraception as your only form of birth control. BARRIER METHODS   Female condom. This is a thin sheath (latex or rubber) that is worn over the penis during sexual intercourse. It can be used with spermicide to increase effectiveness.  Female condom. This is a soft, loose-fitting sheath that is put into the vagina before sexual intercourse.  Diaphragm. This is a soft, latex, dome-shaped barrier that must be fitted by a health care provider. It is inserted into the vagina, along with a spermicidal jelly. It is inserted before intercourse. The diaphragm should be left in the vagina for 6 to 8 hours after intercourse.  Cervical cap. This is a round, soft, latex or  plastic cup that fits over the cervix and must be fitted by a health care provider. The cap can be left in place for up to 48 hours after intercourse.  Sponge. This is a soft, circular piece of polyurethane foam. The sponge has spermicide in it. It is inserted into the vagina after wetting it and before sexual intercourse.  Spermicides. These are chemicals that kill or block sperm from entering the cervix and uterus. They come in the form of creams, jellies, suppositories, foam, or tablets. They do not require a prescription. They are inserted into the vagina with an applicator before having sexual intercourse. The process must be repeated every time you have sexual intercourse. INTRAUTERINE CONTRACEPTION  Intrauterine device (IUD). This is a T-shaped device that is put in a woman's uterus during a menstrual period to prevent pregnancy. There are 2 types:  Copper IUD. This type of IUD is wrapped in copper wire and is placed inside the uterus. Copper makes the uterus and fallopian tubes produce a fluid that kills sperm. It can stay in place for 10 years.  Hormone IUD. This type of IUD contains the hormone progestin (synthetic progesterone). The hormone thickens the cervical mucus and prevents sperm from entering the uterus, and it also thins  the uterine lining to prevent implantation of a fertilized egg. The hormone can weaken or kill the sperm that get into the uterus. It can stay in place for 3-5 years, depending on which type of IUD is used. PERMANENT METHODS OF CONTRACEPTION  Female tubal ligation. This is when the woman's fallopian tubes are surgically sealed, tied, or blocked to prevent the egg from traveling to the uterus.  Hysteroscopic sterilization. This involves placing a small coil or insert into each fallopian tube. Your doctor uses a technique called hysteroscopy to do the procedure. The device causes scar tissue to form. This results in permanent blockage of the fallopian tubes, so the  sperm cannot fertilize the egg. It takes about 3 months after the procedure for the tubes to become blocked. You must use another form of birth control for these 3 months.  Female sterilization. This is when the female has the tubes that carry sperm tied off (vasectomy).This blocks sperm from entering the vagina during sexual intercourse. After the procedure, the man can still ejaculate fluid (semen). NATURAL PLANNING METHODS  Natural family planning. This is not having sexual intercourse or using a barrier method (condom, diaphragm, cervical cap) on days the woman could become pregnant.  Calendar method. This is keeping track of the length of each menstrual cycle and identifying when you are fertile.  Ovulation method. This is avoiding sexual intercourse during ovulation.  Symptothermal method. This is avoiding sexual intercourse during ovulation, using a thermometer and ovulation symptoms.  Post-ovulation method. This is timing sexual intercourse after you have ovulated. Regardless of which type or method of contraception you choose, it is important that you use condoms to protect against the transmission of sexually transmitted infections (STIs). Talk with your health care provider about which form of contraception is most appropriate for you.   This information is not intended to replace advice given to you by your health care provider. Make sure you discuss any questions you have with your health care provider.   Document Released: 11/17/2005 Document Revised: 11/22/2013 Document Reviewed: 05/12/2013 Elsevier Interactive Patient Education 2016 [place] Sex Safe sex is about reducing the risk of giving or getting a sexually transmitted disease (STD). STDs are spread through sexual contact involving the genitals, mouth, or rectum. Some STDs can be cured and others cannot. Safe sex can also prevent unintended pregnancies.  WHAT ARE SOME SAFE SEX PRACTICES?  Limit your sexual  activity to only one partner who is having sex with only you.  Talk to your partner about his or her past partners, past STDs, and drug use.  Use a condom every time you have sexual intercourse. This includes vaginal, oral, and anal sexual activity. Both females and males should wear condoms during oral sex. Only use latex or polyurethane condoms and water-based lubricants. Using petroleum-based lubricants or oils to lubricate a condom will weaken the condom and increase the chance that it will break. The condom should be in place from the beginning to the end of sexual activity. Wearing a condom reduces, but does not completely eliminate, your risk of getting or giving an STD. STDs can be spread by contact with infected body fluids and skin.  Get vaccinated for hepatitis B and HPV.  Avoid alcohol and recreational drugs, which can affect your judgment. You may forget to use a condom or participate in high-risk sex.  For females, avoid douching after sexual intercourse. Douching can spread an infection farther into the reproductive tract.  Check your body  for signs of sores, blisters, rashes, or unusual discharge. See your health care provider if you notice any of these signs.  Avoid sexual contact if you have symptoms of an infection or are being treated for an STD. If you or your partner has herpes, avoid sexual contact when blisters are present. Use condoms at all other times.  If you are at risk of being infected with HIV, it is recommended that you take a prescription medicine daily to prevent HIV infection. This is called pre-exposure prophylaxis (PrEP). You are considered at risk if:  You are a man who has sex with other men (MSM).  You are a heterosexual man or woman who is sexually active with more than one partner.  You take drugs by injection.  You are sexually active with a partner who has HIV.  Talk with your health care provider about whether you are at high risk of being  infected with HIV. If you choose to begin PrEP, you should first be tested for HIV. You should then be tested every 3 months for as long as you are taking PrEP.  See your health care provider for regular screenings, exams, and tests for other STDs. Before having sex with a new partner, each of you should be screened for STDs and should talk about the results with each other. WHAT ARE THE BENEFITS OF SAFE SEX?   There is less chance of getting or giving an STD.  You can prevent unwanted or unintended pregnancies.  By discussing safe sex concerns with your partner, you may increase feelings of intimacy, comfort, trust, and honesty between the two of you.   This information is not intended to replace advice given to you by your health care provider. Make sure you discuss any questions you have with your health care provider.   Document Released: 12/25/2004 Document Revised: 12/08/2014 Document Reviewed: 05/10/2012 Elsevier Interactive Patient Education [employer].

## 2016-09-21 NOTE — MAU Note (Signed)
Pt states she has a vaginal odor, burning when she pees, and pain in her lower abdomen and her sides.  Pt states she would like a pregnancy test and has not taken one at home.

## 2016-09-22 ENCOUNTER — Telehealth: Payer: Self-pay | Admitting: Advanced Practice Midwife

## 2016-09-22 ENCOUNTER — Other Ambulatory Visit (HOSPITAL_COMMUNITY): Payer: Self-pay | Admitting: Advanced Practice Midwife

## 2016-09-22 DIAGNOSIS — A749 Chlamydial infection, unspecified: Secondary | ICD-10-CM

## 2016-09-22 LAB — GC/CHLAMYDIA PROBE AMP (~~LOC~~) NOT AT ARMC
CHLAMYDIA, DNA PROBE: POSITIVE — AB
Neisseria Gonorrhea: NEGATIVE

## 2016-09-22 MED ORDER — AZITHROMYCIN 500 MG PO TABS: 1000.0000 mg | ORAL_TABLET | Freq: Once | ORAL | 0 refills | Status: AC

## 2016-09-22 NOTE — Telephone Encounter (Signed)
[name] [name] returned the phone call she received today regarding her positive chlamydia test.  Azithromycin 1000 mg PO x 1 dose sent to her pharmacy today.  Pt aware of medication and of need for partner treatment. Pt given opportunity to ask questions.

## 2016-09-22 NOTE — Progress Notes (Signed)
Rx sent for zithromax for chlamydia Left message for pt to call  Back for results

## 2016-09-23 ENCOUNTER — Telehealth (HOSPITAL_COMMUNITY): Payer: Self-pay

## 2016-09-23 LAB — URINE CULTURE

## 2016-09-23 NOTE — MAU Note (Signed)
Patient phoned in with question about where medication was sent. Med Rec reviewed and patient informed of pharmacy on file where she can pick up medication.

## 2016-10-10 ENCOUNTER — Emergency Department (HOSPITAL_COMMUNITY)
Admission: EM | Admit: 2016-10-10 | Discharge: 2016-10-10 | Disposition: A | Discharge status: Home / Self Care | Payer: Medicaid Other | Attending: Emergency Medicine | Admitting: Emergency Medicine

## 2016-10-10 ENCOUNTER — Encounter (HOSPITAL_COMMUNITY): Payer: Self-pay | Admitting: Nurse Practitioner

## 2016-10-10 DIAGNOSIS — Z202 Contact with and (suspected) exposure to infections with a predominantly sexual mode of transmission: Secondary | ICD-10-CM | POA: Diagnosis not present

## 2016-10-10 DIAGNOSIS — R1030 Lower abdominal pain, unspecified: Secondary | ICD-10-CM | POA: Diagnosis not present

## 2016-10-10 DIAGNOSIS — R51 Headache: Secondary | ICD-10-CM | POA: Insufficient documentation

## 2016-10-10 DIAGNOSIS — R519 Headache, unspecified: Secondary | ICD-10-CM

## 2016-10-10 DIAGNOSIS — R109 Unspecified abdominal pain: Secondary | ICD-10-CM

## 2016-10-10 LAB — URINALYSIS, ROUTINE W REFLEX MICROSCOPIC
Bilirubin Urine: NEGATIVE
Glucose, UA: NEGATIVE mg/dL
HGB URINE DIPSTICK: NEGATIVE
Ketones, ur: NEGATIVE mg/dL
Leukocytes, UA: NEGATIVE
Nitrite: NEGATIVE
Protein, ur: NEGATIVE mg/dL
SPECIFIC GRAVITY, URINE: 1.015 (ref 1.005–1.030)
pH: 6 (ref 5.0–8.0)

## 2016-10-10 LAB — COMPREHENSIVE METABOLIC PANEL
ALT: 16 U/L (ref 14–54)
ANION GAP: 8 (ref 5–15)
AST: 21 U/L (ref 15–41)
Albumin: 4 g/dL (ref 3.5–5.0)
Alkaline Phosphatase: 53 U/L (ref 38–126)
BUN: 5 mg/dL — ABNORMAL LOW (ref 6–20)
CALCIUM: 8.8 mg/dL — AB (ref 8.9–10.3)
CHLORIDE: 104 mmol/L (ref 101–111)
CO2: 26 mmol/L (ref 22–32)
Creatinine, Ser: 0.78 mg/dL (ref 0.44–1.00)
GFR calc non Af Amer: >60 mL/min (ref >60)
Glucose, Bld: 93 mg/dL (ref 65–99)
Potassium: 3.5 mmol/L (ref 3.5–5.1)
SODIUM: 138 mmol/L (ref 135–145)
Total Bilirubin: 0.5 mg/dL (ref 0.3–1.2)
Total Protein: 7.3 g/dL (ref 6.5–8.1)

## 2016-10-10 LAB — CBC
HCT: 37.2 % (ref 36.0–46.0)
HEMOGLOBIN: 12.2 g/dL (ref 12.0–15.0)
MCH: 30.9 pg (ref 26.0–34.0)
MCHC: 32.8 g/dL (ref 30.0–36.0)
MCV: 94.2 fL (ref 78.0–100.0)
Platelets: 398 10*3/uL (ref 150–400)
RBC: 3.95 MIL/uL (ref 3.87–5.11)
RDW: 12.9 % (ref 11.5–15.5)
WBC: 6.3 10*3/uL (ref 4.0–10.5)

## 2016-10-10 LAB — I-STAT BETA HCG BLOOD, ED (MC, WL, AP ONLY)

## 2016-10-10 LAB — WET PREP, GENITAL
CLUE CELLS WET PREP: NONE SEEN
Sperm: NONE SEEN
TRICH WET PREP: NONE SEEN
YEAST WET PREP: NONE SEEN

## 2016-10-10 MED ORDER — IBUPROFEN 800 MG PO TABS: 800.0000 mg | ORAL_TABLET | Freq: Three times a day (TID) | ORAL | 0 refills | Status: DC

## 2016-10-10 MED ORDER — LIDOCAINE HCL (PF) 1 % IJ SOLN
INTRAMUSCULAR | Status: AC
  Administered 2016-10-10: 21:00:00
  Filled 2016-10-10: qty 5

## 2016-10-10 MED ORDER — AZITHROMYCIN 250 MG PO TABS
1000.0000 mg | ORAL_TABLET | Freq: Once | ORAL | Status: AC
  Administered 2016-10-10: 1000 mg via ORAL
  Filled 2016-10-10: qty 4

## 2016-10-10 MED ORDER — CEFTRIAXONE SODIUM 250 MG IJ SOLR
250.0000 mg | Freq: Once | INTRAMUSCULAR | Status: AC
  Administered 2016-10-10: 250 mg via INTRAMUSCULAR
  Filled 2016-10-10: qty 250

## 2016-10-10 MED ORDER — KETOROLAC TROMETHAMINE 30 MG/ML IJ SOLN
30.0000 mg | Freq: Once | INTRAMUSCULAR | Status: AC
  Administered 2016-10-10: 30 mg via INTRAMUSCULAR
  Filled 2016-10-10: qty 1

## 2016-10-10 NOTE — ED Triage Notes (Signed)
Pt presents with multiple complaints. She c/o headache, sore throat, abd pain, and need for STD check. The headache began 1 month ago. She has taken ibuprofen with no relief. The sore throat began 2 weeks ago. The abd pain began 3 days ago. The pain is in her lower abd. She reports nausea, loose stools. She denies fevers, urinary changes. A recent sexual contact told her to they were diagnosed with an STD but would not tell her which STD. She denies any vaginal discharge, pain, Itching.
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
## 2016-10-10 NOTE — ED Notes (Signed)
Confirmed with Ashleigh with Phlebotomy that blood for HIV test collected

## 2016-10-10 NOTE — Discharge Instructions (Signed)
Please read and follow all provided instructions.  Your diagnoses today include:  1. Abdominal pain, unspecified abdominal location   2. STD exposure   3. Nonintractable headache, unspecified chronicity pattern, unspecified headache type     Tests performed today include: Vital signs. See below for your results today.   Medications prescribed:  Take as prescribed   Home care instructions:  Follow any educational materials contained in this packet.  Follow-up instructions: Please follow-up with your primary care provider for further evaluation of symptoms and treatment   Return instructions:  Please return to the Emergency Department if you do not get better, if you get worse, or new symptoms OR  - Fever (temperature greater than 101.6F)  - Bleeding that does not stop with holding pressure to the area    -Severe pain (please note that you may be more sore the day after your accident)  - Chest Pain  - Difficulty breathing  - Severe nausea or vomiting  - Inability to tolerate food and liquids  - Passing out  - Skin becoming red around your wounds  - Change in mental status (confusion or lethargy)  - New numbness or weakness    Please return if you have any other emergent concerns.  Additional Information:  Your vital signs today were: BP (!) 143/104    Pulse 70    Temp 98.8 F (37.1 C) (Oral)    Resp 18    LMP 08/02/2016    SpO2 100%  If your blood pressure (BP) was elevated above 135/85 this visit, please have this repeated by your doctor within one month. ---------------

## 2016-10-11 LAB — HIV ANTIBODY (ROUTINE TESTING W REFLEX): HIV SCREEN 4TH GENERATION: NONREACTIVE

## 2016-10-13 LAB — GC/CHLAMYDIA PROBE AMP (~~LOC~~) NOT AT ARMC
CHLAMYDIA, DNA PROBE: POSITIVE — AB
NEISSERIA GONORRHEA: POSITIVE — AB

## 2016-10-14 ENCOUNTER — Encounter (HOSPITAL_COMMUNITY): Payer: Self-pay | Admitting: Emergency Medicine

## 2016-10-22 ENCOUNTER — Ambulatory Visit: Payer: Medicaid Other | Admitting: Internal Medicine

## 2016-10-28 ENCOUNTER — Ambulatory Visit: Payer: Medicaid Other | Admitting: Student

## 2016-10-31 ENCOUNTER — Ambulatory Visit: Payer: Medicaid Other | Admitting: Family Medicine

## 2016-11-03 ENCOUNTER — Telehealth: Payer: Self-pay | Admitting: Family Medicine

## 2016-11-03 NOTE — Telephone Encounter (Signed)
Pt wasn't feeling well 12/1. Rescheduled her 12/13 @ 1:45pm. Suggest she call at least 24 hrs ahead of time if she can't keep appt. - Mesha Guinyard

## 2016-11-12 ENCOUNTER — Encounter: Payer: Self-pay | Admitting: Family Medicine

## 2016-11-12 ENCOUNTER — Ambulatory Visit (INDEPENDENT_AMBULATORY_CARE_PROVIDER_SITE_OTHER): Payer: Medicaid Other | Admitting: Family Medicine

## 2016-11-12 ENCOUNTER — Other Ambulatory Visit (HOSPITAL_COMMUNITY)
Admission: RE | Admit: 2016-11-12 | Discharge: 2016-11-12 | Disposition: A | Discharge status: Home / Self Care | Payer: Medicaid Other | Source: Ambulatory Visit | Attending: Family Medicine | Admitting: Family Medicine

## 2016-11-12 VITALS — BP 134/86 | HR 78 | Temp 98.6°F | Ht 62.0 in | Wt 238.8 lb

## 2016-11-12 DIAGNOSIS — N644 Mastodynia: Secondary | ICD-10-CM | POA: Diagnosis not present

## 2016-11-12 DIAGNOSIS — R3915 Urgency of urination: Secondary | ICD-10-CM

## 2016-11-12 DIAGNOSIS — Z113 Encounter for screening for infections with a predominantly sexual mode of transmission: Secondary | ICD-10-CM | POA: Diagnosis present

## 2016-11-12 DIAGNOSIS — A749 Chlamydial infection, unspecified: Secondary | ICD-10-CM | POA: Diagnosis not present

## 2016-11-12 LAB — POCT URINALYSIS DIPSTICK
BILIRUBIN UA: NEGATIVE
Blood, UA: NEGATIVE
Glucose, UA: NEGATIVE
KETONES UA: NEGATIVE
Nitrite, UA: NEGATIVE
PH UA: 6
Protein, UA: NEGATIVE
SPEC GRAV UA: 1.02
Urobilinogen, UA: 0.2

## 2016-11-12 LAB — POCT UA - MICROSCOPIC ONLY

## 2016-11-12 LAB — POCT WET PREP (WET MOUNT)
CLUE CELLS WET PREP WHIFF POC: NEGATIVE
TRICHOMONAS WET PREP HPF POC: ABSENT

## 2016-11-12 LAB — POCT URINE PREGNANCY: Preg Test, Ur: NEGATIVE

## 2016-11-12 MED ORDER — FLUCONAZOLE 150 MG PO TABS: 150.0000 mg | ORAL_TABLET | Freq: Once | ORAL | 1 refills | Status: AC

## 2016-11-12 NOTE — Patient Instructions (Signed)
I have sent Diflucan to your pharmacy, just take 1 tablet. Our office will call you with the results of all your tests today.

## 2016-11-12 NOTE — Assessment & Plan Note (Signed)
Patient with a recent h/o chlamydia and gonorrhea presenting for STD testing. Wet prep positive for yeast. - Rx for diflucan 150mg  orally  - GC/chlamyidia cervical and oral swabs obtained. - HIV and RPR ordered. - discussed condom use, patient is not interested.
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
## 2016-11-13 LAB — RPR

## 2016-11-13 LAB — HIV ANTIBODY (ROUTINE TESTING W REFLEX): HIV: NONREACTIVE

## 2016-11-13 LAB — CERVICOVAGINAL ANCILLARY ONLY
Chlamydia: NEGATIVE
NEISSERIA GONORRHEA: NEGATIVE

## 2016-11-13 LAB — GC/CHLAMYDIA PROBE AMP (~~LOC~~) NOT AT ARMC
Chlamydia: NEGATIVE
Neisseria Gonorrhea: NEGATIVE

## 2016-11-14 ENCOUNTER — Telehealth (HOSPITAL_COMMUNITY): Payer: Self-pay | Admitting: Family Medicine

## 2016-11-14 NOTE — Telephone Encounter (Signed)
Please let the patient know that all STD testing was negative (GC/chlamydia, HIV, and syphilis).   Thanks, Archie Patten, MD [facility] Family Medicine Resident  11/14/2016, 1:23 PM

## 2016-11-14 NOTE — Telephone Encounter (Signed)
Pt informed. Wants to know if you signed the letter she told you about at her appt. Please advise. Mindel Friscia Kennon Holter, CMA

## 2016-11-17 ENCOUNTER — Other Ambulatory Visit: Payer: Self-pay | Admitting: Family Medicine

## 2016-11-19 NOTE — Telephone Encounter (Signed)
Attempted to call patient back, no answer.  If she returns call: There was actually no letter for me to sign, all I received was an evaluation from social security from April 2016. In summary this evaluation stated: she most likely had an intellectual disability (which was already established), she may be able to hold down a simple, repetitive job, she is still capable of managing her own benefits.  I will attempt to contact her again in the near future.  Archie Patten, MD [facility] Family Medicine Resident  11/19/2016, 11:12 AM

## 2016-11-20 NOTE — Telephone Encounter (Signed)
Attempted to call patient again, no answer.  Archie Patten, MD [facility] Family Medicine Resident  11/20/2016, 10:02 AM

## 2016-12-15 ENCOUNTER — Ambulatory Visit (INDEPENDENT_AMBULATORY_CARE_PROVIDER_SITE_OTHER): Payer: Medicaid Other | Admitting: *Deleted

## 2016-12-15 DIAGNOSIS — Z3200 Encounter for pregnancy test, result unknown: Secondary | ICD-10-CM

## 2016-12-15 DIAGNOSIS — Z3202 Encounter for pregnancy test, result negative: Secondary | ICD-10-CM | POA: Diagnosis not present

## 2016-12-15 LAB — POCT PREGNANCY, URINE: PREG TEST UR: NEGATIVE

## 2016-12-15 NOTE — Progress Notes (Signed)
Patient presents to clinic for pregnancy test which is negative. LMP was early and is usually very regular. Bleeding was unusual, light and brownish. Feels pregnant, having breast tenderness, nausea, fatigue. Advised that if she is pregnant it may be very early and too soon to detect in the urine. Told her if she wants to be certain of pregnancy she should wait until she misses a period and return for another pregnancy test. Understanding voiced.

## 2017-01-02 ENCOUNTER — Ambulatory Visit (INDEPENDENT_AMBULATORY_CARE_PROVIDER_SITE_OTHER): Payer: Medicaid Other | Admitting: Family Medicine

## 2017-01-02 VITALS — BP 112/82 | HR 82 | Temp 98.0°F | Ht 62.0 in | Wt 234.0 lb

## 2017-01-02 DIAGNOSIS — N912 Amenorrhea, unspecified: Secondary | ICD-10-CM | POA: Diagnosis present

## 2017-01-02 LAB — POCT URINE PREGNANCY: Preg Test, Ur: NEGATIVE

## 2017-01-02 MED ORDER — TRAZODONE HCL 50 MG PO TABS: 50.0000 mg | ORAL_TABLET | Freq: Every evening | ORAL | 0 refills | Status: DC | PRN

## 2017-01-02 NOTE — Patient Instructions (Signed)
You were seen in clinic today and expressed concerns about not being able to go to sleep at night.  Due to the stressors you are undergoing, that is normal and I have prescribed you some Trazodone (sent to your pharmacy).  Take one before bedtime each night .  I expect this should help you.  In addition, continue to do things to relax prior to bedtime including listening to relaxing music and warm baths.   In addition, do not breastfeed while taking Trazodone.
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
## 2017-01-29 ENCOUNTER — Other Ambulatory Visit: Payer: Self-pay | Admitting: Family Medicine

## 2017-02-03 NOTE — Telephone Encounter (Signed)
See "Contacts" 

## 2017-02-23 ENCOUNTER — Other Ambulatory Visit: Payer: Self-pay | Admitting: Family Medicine

## 2017-03-28 ENCOUNTER — Encounter (HOSPITAL_COMMUNITY): Payer: Self-pay | Admitting: *Deleted

## 2017-03-28 ENCOUNTER — Emergency Department (HOSPITAL_COMMUNITY)
Admission: EM | Admit: 2017-03-28 | Discharge: 2017-03-28 | Disposition: A | Discharge status: Home / Self Care | Payer: Medicaid Other | Attending: Emergency Medicine | Admitting: Emergency Medicine

## 2017-03-28 ENCOUNTER — Emergency Department (HOSPITAL_COMMUNITY): Payer: Medicaid Other

## 2017-03-28 DIAGNOSIS — F909 Attention-deficit hyperactivity disorder, unspecified type: Secondary | ICD-10-CM | POA: Insufficient documentation

## 2017-03-28 DIAGNOSIS — Z7982 Long term (current) use of aspirin: Secondary | ICD-10-CM | POA: Insufficient documentation

## 2017-03-28 DIAGNOSIS — R1031 Right lower quadrant pain: Secondary | ICD-10-CM | POA: Diagnosis not present

## 2017-03-28 DIAGNOSIS — R109 Unspecified abdominal pain: Secondary | ICD-10-CM | POA: Diagnosis present

## 2017-03-28 LAB — CBC
HEMATOCRIT: 37.8 % (ref 36.0–46.0)
Hemoglobin: 12.3 g/dL (ref 12.0–15.0)
MCH: 30.4 pg (ref 26.0–34.0)
MCHC: 32.5 g/dL (ref 30.0–36.0)
MCV: 93.6 fL (ref 78.0–100.0)
PLATELETS: 421 10*3/uL — AB (ref 150–400)
RBC: 4.04 MIL/uL (ref 3.87–5.11)
RDW: 13.2 % (ref 11.5–15.5)
WBC: 9 10*3/uL (ref 4.0–10.5)

## 2017-03-28 LAB — COMPREHENSIVE METABOLIC PANEL
ALK PHOS: 54 U/L (ref 38–126)
ALT: 15 U/L (ref 14–54)
AST: 21 U/L (ref 15–41)
Albumin: 3.9 g/dL (ref 3.5–5.0)
Anion gap: 11 (ref 5–15)
BUN: 11 mg/dL (ref 6–20)
CALCIUM: 9 mg/dL (ref 8.9–10.3)
CO2: 24 mmol/L (ref 22–32)
CREATININE: 0.72 mg/dL (ref 0.44–1.00)
Chloride: 102 mmol/L (ref 101–111)
Glucose, Bld: 86 mg/dL (ref 65–99)
Potassium: 3.5 mmol/L (ref 3.5–5.1)
SODIUM: 137 mmol/L (ref 135–145)
Total Bilirubin: <0.3 mg/dL — ABNORMAL LOW (ref 0.3–1.2)
Total Protein: 7.5 g/dL (ref 6.5–8.1)

## 2017-03-28 LAB — URINALYSIS, ROUTINE W REFLEX MICROSCOPIC
Bilirubin Urine: NEGATIVE
GLUCOSE, UA: NEGATIVE mg/dL
HGB URINE DIPSTICK: NEGATIVE
Ketones, ur: NEGATIVE mg/dL
Leukocytes, UA: NEGATIVE
Nitrite: NEGATIVE
PH: 6 (ref 5.0–8.0)
PROTEIN: NEGATIVE mg/dL
SPECIFIC GRAVITY, URINE: 1.03 (ref 1.005–1.030)

## 2017-03-28 LAB — HCG, SERUM, QUALITATIVE: Preg, Serum: NEGATIVE

## 2017-03-28 LAB — LIPASE, BLOOD: Lipase: 28 U/L (ref 11–51)

## 2017-03-28 MED ORDER — IOPAMIDOL (ISOVUE-300) INJECTION 61%
INTRAVENOUS | Status: AC
  Administered 2017-03-28: 100 mL
  Filled 2017-03-28: qty 100

## 2017-03-28 MED ORDER — KETOROLAC TROMETHAMINE 30 MG/ML IJ SOLN
30.0000 mg | Freq: Once | INTRAMUSCULAR | Status: DC
  Filled 2017-03-28: qty 1

## 2017-03-28 MED ORDER — KETOROLAC TROMETHAMINE 30 MG/ML IJ SOLN
30.0000 mg | Freq: Once | INTRAMUSCULAR | Status: AC
  Administered 2017-03-28: 30 mg via INTRAMUSCULAR

## 2017-03-28 MED ORDER — ONDANSETRON HCL 4 MG/2ML IJ SOLN
4.0000 mg | Freq: Once | INTRAMUSCULAR | Status: AC
  Administered 2017-03-28: 4 mg via INTRAVENOUS
  Filled 2017-03-28: qty 2

## 2017-03-28 MED ORDER — IBUPROFEN 800 MG PO TABS: 800.0000 mg | ORAL_TABLET | Freq: Three times a day (TID) | ORAL | 0 refills | Status: DC

## 2017-03-28 NOTE — ED Notes (Signed)
Pt reports pain in lower abdominal area, also reports pain during sex.

## 2017-03-28 NOTE — Discharge Instructions (Signed)
Ibuprofen as needed for pain. Follow-up with your OB/GYN

## 2017-03-28 NOTE — ED Triage Notes (Signed)
The pt is c/o lower abd pain for one to two weeks with nausea.  lmp April just spotting  [place] normal was march  mid
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
## 2017-03-28 NOTE — ED Notes (Signed)
RN attempted to start IV, unsuccessful. 

## 2017-04-09 ENCOUNTER — Other Ambulatory Visit (HOSPITAL_COMMUNITY)
Admission: RE | Admit: 2017-04-09 | Discharge: 2017-04-09 | Disposition: A | Discharge status: Home / Self Care | Payer: Medicaid Other | Source: Ambulatory Visit | Attending: Family Medicine | Admitting: Family Medicine

## 2017-04-09 ENCOUNTER — Ambulatory Visit (INDEPENDENT_AMBULATORY_CARE_PROVIDER_SITE_OTHER): Payer: Medicaid Other | Admitting: Family Medicine

## 2017-04-09 ENCOUNTER — Encounter: Payer: Self-pay | Admitting: Family Medicine

## 2017-04-09 VITALS — BP 118/80 | HR 97 | Temp 98.2°F | Wt 245.0 lb

## 2017-04-09 DIAGNOSIS — A549 Gonococcal infection, unspecified: Secondary | ICD-10-CM | POA: Insufficient documentation

## 2017-04-09 DIAGNOSIS — N912 Amenorrhea, unspecified: Secondary | ICD-10-CM | POA: Diagnosis not present

## 2017-04-09 DIAGNOSIS — B9689 Other specified bacterial agents as the cause of diseases classified elsewhere: Secondary | ICD-10-CM | POA: Diagnosis not present

## 2017-04-09 DIAGNOSIS — N760 Acute vaginitis: Secondary | ICD-10-CM | POA: Diagnosis not present

## 2017-04-09 DIAGNOSIS — Z202 Contact with and (suspected) exposure to infections with a predominantly sexual mode of transmission: Secondary | ICD-10-CM

## 2017-04-09 LAB — POCT WET PREP (WET MOUNT)
Clue Cells Wet Prep Whiff POC: NEGATIVE
TRICHOMONAS WET PREP HPF POC: ABSENT

## 2017-04-09 LAB — POCT URINE PREGNANCY: PREG TEST UR: NEGATIVE

## 2017-04-09 MED ORDER — METRONIDAZOLE 500 MG PO TABS: 500.0000 mg | ORAL_TABLET | Freq: Two times a day (BID) | ORAL | 0 refills | Status: DC

## 2017-04-09 NOTE — Progress Notes (Signed)
Subjective: CC: possible pregnancy [name] [name] is a 31 y.o. female presenting to clinic today for same day appointment. PCP: Archie Patten, MD Concerns today include:  1. Possible pregnancy/ Possible exposure to STDs Patient reports that her period is overdue by 18 days.   Patient denies vaginal discharge.  She endorses vaginal odors.  She endorses vaginal pruritis.  Denies abnormal vaginal bleeding, dysuria, hematuria, pelvic pain, nausea, vomiting, fevers.   Positive history of STI, chlamydia last year.  She is sexually active and does not use condoms.   Last unprotected sex was 04/06/17.  [id number].  She reports she is not trying to get pregnant but would be ok if she was.  Contraception: none.  Patient's last menstrual period was 02/21/2017.  Additionally, reports that her partner was unfaithful and she would like STI testing.  No Known Allergies  Social Hx reviewed. MedHx, current medications and allergies reviewed.  Please see EMR. ROS: Per HPI  Objective: Office vital signs reviewed. BP 118/80   Pulse 97   Temp 98.2 F (36.8 C) (Oral)   Wt 245 lb (111.1 kg)   LMP 02/21/2017   SpO2 99%   BMI 44.81 kg/m   Physical Examination:  General: Awake, alert, obese, No acute distress Cardio: regular rate and rhythm, S1S2 heard, no murmurs appreciated Pulm: clear to auscultation bilaterally, no wheezes, rhonchi or rales; normal work of breathing on room air GI: soft, non-tender, non-distended, bowel sounds present x4, no hepatomegaly, no splenomegaly, no masses GU: external vaginal tissue without lesions, cervix midline, no punctate lesions on cervix appreciated, scant yellow thin discharge from cervical os, no bleeding, no cervical motion tenderness, no abdominal/ adnexal masses palpated on obese abdomen.   Results for orders placed or performed in visit on 04/09/17 (from the past 24 hour(s))  POCT urine pregnancy     Status: None   Collection Time: 04/09/17  8:50 AM    Result Value Ref Range   Preg Test, Ur Negative Negative  POCT Wet Prep [facility] [place])     Status: Abnormal   Collection Time: 04/09/17  9:15 AM  Result Value Ref Range   Source Wet Prep POC VAG    WBC, Wet Prep HPF POC 3-8    Bacteria Wet Prep HPF POC Few Few   Clue Cells Wet Prep HPF POC Few (A) None   Clue Cells Wet Prep Whiff POC Negative Whiff    Yeast Wet Prep HPF POC None    Trichomonas Wet Prep HPF POC Absent Absent    Assessment/ Plan: 31 y.o. female   1. Amenorrhea.  Upreg negative.  ?irregular cycles 2/2 undiagnosed PCOS.  Patient is morbidly obese.  Consider retesting in a week vs doing amenorrhea work up. - Would recommend further contraception counseling going forward if patient does not desire pregnancy. - POCT urine pregnancy  2. Possible exposure to STD.  STI labs obtained.  Reinforced safe sex practices.  Will contact patient via telephone regarding results as requested.  She gives permission to leave VM.  She would also like these mailed to her. - Cervicovaginal ancillary only - HIV antibody - RPR - POCT Wet Prep ([place])  3. Bacterial vaginosis.  Clue cells on wet prep.  Attempted to reach patient regarding this result.  I left a VM as she requested during visit.   - Flagyl 500mg  PO BID x7d - Instructions for use provided  Follow up with PCP prn.  Will contact with results   Janora Norlander,  DO PGY-3, [facility]

## 2017-04-09 NOTE — Patient Instructions (Signed)
Sexually Transmitted Disease A sexually transmitted disease (STD) is a disease or infection that may be passed (transmitted) from person to person, usually during sexual activity. This may happen by way of saliva, semen, blood, vaginal mucus, or urine. Common STDs include:  Gonorrhea.  Chlamydia.  Syphilis.  HIV and AIDS.  Genital herpes.  Hepatitis B and C.  Trichomonas.  Human papillomavirus (HPV).  Pubic lice.  Scabies.  Mites.  Bacterial vaginosis. What are the causes? An STD may be caused by bacteria, a virus, or parasites. STDs are often transmitted during sexual activity if one person is infected. However, they may also be transmitted through nonsexual means. STDs may be transmitted after:  Sexual intercourse with an infected person.  Sharing sex toys with an infected person.  Sharing needles with an infected person or using unclean piercing or tattoo needles.  Having intimate contact with the genitals, mouth, or rectal areas of an infected person.  Exposure to infected fluids during birth. What are the signs or symptoms? Different STDs have different symptoms. Some people may not have any symptoms. If symptoms are present, they may include:  Painful or bloody urination.  Pain in the pelvis, abdomen, vagina, anus, throat, or eyes.  A skin rash, itching, or irritation.  Growths, ulcerations, blisters, or sores in the genital and anal areas.  Abnormal vaginal discharge with or without bad odor.  Penile discharge in men.  Fever.  Pain or bleeding during sexual intercourse.  Swollen glands in the groin area.  Yellow skin and eyes (jaundice). This is seen with hepatitis.  Swollen testicles.  Infertility.  Sores and blisters in the mouth. How is this diagnosed? To make a diagnosis, your health care provider may:  Take a medical history.  Perform a physical exam.  Take a sample of any discharge to examine.  Swab the throat, cervix, opening to  the penis, rectum, or vagina for testing.  Test a sample of your first morning urine.  Perform blood tests.  Perform a Pap test, if this applies.  Perform a colposcopy.  Perform a laparoscopy. How is this treated? Treatment depends on the STD. Some STDs may be treated but not cured.  Chlamydia, gonorrhea, trichomonas, and syphilis can be cured with antibiotic medicine.  Genital herpes, hepatitis, and HIV can be treated, but not cured, with prescribed medicines. The medicines lessen symptoms.  Genital warts from HPV can be treated with medicine or by freezing, burning (electrocautery), or surgery. Warts may come back.  HPV cannot be cured with medicine or surgery. However, abnormal areas may be removed from the cervix, vagina, or vulva.  If your diagnosis is confirmed, your recent sexual partners need treatment. This is true even if they are symptom-free or have a negative culture or evaluation. They should not have sex until their health care providers say it is okay.  Your health care provider may test you for infection again 3 months after treatment. How is this prevented? Take these steps to reduce your risk of getting an STD:  Use latex condoms, dental dams, and water-soluble lubricants during sexual activity. Do not use petroleum jelly or oils.  Avoid having multiple sex partners.  Do not have sex with someone who has other sex partners.  Do not have sex with anyone you do not know or who is at high risk for an STD.  Avoid risky sex practices that can break your skin.  Do not have sex if you have open sores on your mouth or skin.  Avoid drinking too much alcohol or taking illegal drugs. Alcohol and drugs can affect your judgment and put you in a vulnerable position.  Avoid engaging in oral and anal sex acts.  Get vaccinated for HPV and hepatitis. If you have not received these vaccines in the past, talk to your health care provider about whether one or both might be  right for you.  If you are at risk of being infected with HIV, it is recommended that you take a prescription medicine daily to prevent HIV infection. This is called pre-exposure prophylaxis (PrEP). You are considered at risk if:  You are a man who has sex with other men (MSM).  You are a heterosexual man or woman and are sexually active with more than one partner.  You take drugs by injection.  You are sexually active with a partner who has HIV.  Talk with your health care provider about whether you are at high risk of being infected with HIV. If you choose to begin PrEP, you should first be tested for HIV. You should then be tested every 3 months for as long as you are taking PrEP. Contact a health care provider if:  See your health care provider.  Tell your sexual partner(s). They should be tested and treated for any STDs.  Do not have sex until your health care provider says it is okay. Get help right away if: Contact your health care provider right away if:  You have severe abdominal pain.  You are a man and notice swelling or pain in your testicles.  You are a woman and notice swelling or pain in your vagina. This information is not intended to replace advice given to you by your health care provider. Make sure you discuss any questions you have with your health care provider. Document Released: 02/07/2003 Document Revised: 06/06/2016 Document Reviewed: 06/07/2013 Elsevier Interactive Patient Education  2017 [employer].

## 2017-04-10 LAB — CERVICOVAGINAL ANCILLARY ONLY
CHLAMYDIA, DNA PROBE: NEGATIVE
Neisseria Gonorrhea: POSITIVE — AB

## 2017-04-10 LAB — HIV ANTIBODY (ROUTINE TESTING W REFLEX): HIV Screen 4th Generation wRfx: NONREACTIVE

## 2017-04-10 LAB — RPR: RPR: NONREACTIVE

## 2017-04-13 ENCOUNTER — Telehealth: Payer: Self-pay | Admitting: Family Medicine

## 2017-04-13 ENCOUNTER — Ambulatory Visit (INDEPENDENT_AMBULATORY_CARE_PROVIDER_SITE_OTHER): Payer: Medicaid Other | Admitting: *Deleted

## 2017-04-13 DIAGNOSIS — A549 Gonococcal infection, unspecified: Secondary | ICD-10-CM | POA: Diagnosis present

## 2017-04-13 MED ORDER — CEFTRIAXONE SODIUM 250 MG IJ SOLR
250.0000 mg | Freq: Once | INTRAMUSCULAR | Status: AC
  Administered 2017-04-13: 250 mg via INTRAMUSCULAR

## 2017-04-13 MED ORDER — AZITHROMYCIN 500 MG PO TABS
1000.0000 mg | ORAL_TABLET | Freq: Once | ORAL | Status: AC
  Administered 2017-04-13: 1000 mg via ORAL

## 2017-04-13 NOTE — Progress Notes (Signed)
   Patient in nurse clinic today for gonorrhea treatment.  Patient advised no sex for 2 weeks. Patient's partner should be tested and treated.  Ceftriaxone 250 mg IM x 1 RUOQ and Azithromycin 1 gm PO x 1 given by Dr. Marjean Donna orders.  Patient to follow up 2-3 months for re-screening or as needed with PCP. Communicable disease report faxed to [facility].   Derl Barrow, RN
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
## 2017-04-13 NOTE — Telephone Encounter (Signed)
Left voice message for patient to call for an appointment in RN clinic.  See message from provider below.  Derl Barrow, RN

## 2017-04-22 ENCOUNTER — Ambulatory Visit (INDEPENDENT_AMBULATORY_CARE_PROVIDER_SITE_OTHER): Payer: Medicaid Other | Admitting: *Deleted

## 2017-04-22 DIAGNOSIS — A549 Gonococcal infection, unspecified: Secondary | ICD-10-CM

## 2017-04-22 MED ORDER — CEFTRIAXONE SODIUM 250 MG IJ SOLR
250.0000 mg | Freq: Once | INTRAMUSCULAR | Status: AC
  Administered 2017-04-22: 250 mg via INTRAMUSCULAR

## 2017-04-22 MED ORDER — AZITHROMYCIN 500 MG PO TABS
1000.0000 mg | ORAL_TABLET | Freq: Once | ORAL | Status: AC
  Administered 2017-04-22: 1000 mg via ORAL

## 2017-04-22 NOTE — Progress Notes (Signed)
   Pt in nurse clinic requesting to be retreated for gonorrhea. Pt was treated last Monday 04/13/17. However, patient reported of having unprotected sex with her partner the next day.  Partner was not treated at that time. Pt's partner was not treated until Friday 04/17/17.  Advised patient that she should not have sex for 2 weeks and follow up with PCP.  Azithromycin 1 gm PO x 1 and Ceftriaxone 250 mg IM x 1 LUOQ given per order.  Will forward to PCP.  Derl Barrow, RN

## 2017-04-23 ENCOUNTER — Other Ambulatory Visit: Payer: Self-pay | Admitting: Family Medicine

## 2017-04-27 DIAGNOSIS — Y9289 Other specified places as the place of occurrence of the external cause: Secondary | ICD-10-CM | POA: Insufficient documentation

## 2017-04-27 DIAGNOSIS — S0512XA Contusion of eyeball and orbital tissues, left eye, initial encounter: Secondary | ICD-10-CM | POA: Insufficient documentation

## 2017-04-27 DIAGNOSIS — Y9389 Activity, other specified: Secondary | ICD-10-CM | POA: Insufficient documentation

## 2017-04-27 DIAGNOSIS — Y999 Unspecified external cause status: Secondary | ICD-10-CM | POA: Diagnosis not present

## 2017-04-27 DIAGNOSIS — H1132 Conjunctival hemorrhage, left eye: Secondary | ICD-10-CM | POA: Diagnosis not present

## 2017-04-27 DIAGNOSIS — Z7982 Long term (current) use of aspirin: Secondary | ICD-10-CM | POA: Diagnosis not present

## 2017-04-27 DIAGNOSIS — Z23 Encounter for immunization: Secondary | ICD-10-CM | POA: Insufficient documentation

## 2017-04-27 DIAGNOSIS — Z79899 Other long term (current) drug therapy: Secondary | ICD-10-CM | POA: Insufficient documentation

## 2017-04-27 DIAGNOSIS — S0993XA Unspecified injury of face, initial encounter: Secondary | ICD-10-CM | POA: Diagnosis present

## 2017-04-28 ENCOUNTER — Emergency Department (HOSPITAL_COMMUNITY): Payer: Medicaid Other

## 2017-04-28 ENCOUNTER — Emergency Department (HOSPITAL_COMMUNITY)
Admission: EM | Admit: 2017-04-28 | Discharge: 2017-04-28 | Disposition: A | Discharge status: Home / Self Care | Payer: Medicaid Other | Attending: Emergency Medicine | Admitting: Emergency Medicine

## 2017-04-28 ENCOUNTER — Encounter (HOSPITAL_COMMUNITY): Payer: Self-pay | Admitting: Emergency Medicine

## 2017-04-28 DIAGNOSIS — H1132 Conjunctival hemorrhage, left eye: Secondary | ICD-10-CM

## 2017-04-28 DIAGNOSIS — S0512XA Contusion of eyeball and orbital tissues, left eye, initial encounter: Secondary | ICD-10-CM

## 2017-04-28 MED ORDER — TETRACAINE HCL 0.5 % OP SOLN
2.0000 [drp] | Freq: Once | OPHTHALMIC | Status: AC
  Administered 2017-04-28: 2 [drp] via OPHTHALMIC
  Filled 2017-04-28: qty 2

## 2017-04-28 MED ORDER — FLUORESCEIN SODIUM 0.6 MG OP STRP
1.0000 | ORAL_STRIP | Freq: Once | OPHTHALMIC | Status: AC
  Administered 2017-04-28: 1 via OPHTHALMIC
  Filled 2017-04-28: qty 1

## 2017-04-28 MED ORDER — NAPROXEN 500 MG PO TABS: 500.0000 mg | ORAL_TABLET | Freq: Two times a day (BID) | ORAL | 0 refills | Status: DC

## 2017-04-28 MED ORDER — TETANUS-DIPHTH-ACELL PERTUSSIS 5-2.5-18.5 LF-MCG/0.5 IM SUSP
0.5000 mL | Freq: Once | INTRAMUSCULAR | Status: AC
  Administered 2017-04-28: 0.5 mL via INTRAMUSCULAR
  Filled 2017-04-28: qty 0.5

## 2017-04-28 MED ORDER — OXYCODONE-ACETAMINOPHEN 5-325 MG PO TABS
1.0000 | ORAL_TABLET | Freq: Once | ORAL | Status: AC
  Administered 2017-04-28: 1 via ORAL
  Filled 2017-04-28: qty 1

## 2017-04-28 MED ORDER — HYDROCODONE-ACETAMINOPHEN 5-325 MG PO TABS: 1.0000 | ORAL_TABLET | Freq: Four times a day (QID) | ORAL | 0 refills | Status: DC | PRN
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
## 2017-05-28 ENCOUNTER — Encounter (HOSPITAL_COMMUNITY): Payer: Self-pay

## 2017-05-28 ENCOUNTER — Emergency Department (HOSPITAL_COMMUNITY)
Admission: EM | Admit: 2017-05-28 | Discharge: 2017-05-28 | Disposition: A | Discharge status: Home / Self Care | Payer: Medicaid Other | Attending: Emergency Medicine | Admitting: Emergency Medicine

## 2017-05-28 ENCOUNTER — Emergency Department (HOSPITAL_COMMUNITY)
Admission: EM | Admit: 2017-05-28 | Discharge: 2017-05-29 | Disposition: A | Discharge status: Home / Self Care | Payer: Medicaid Other | Source: Home / Self Care | Attending: Emergency Medicine | Admitting: Emergency Medicine

## 2017-05-28 ENCOUNTER — Encounter (HOSPITAL_COMMUNITY): Payer: Self-pay | Admitting: Emergency Medicine

## 2017-05-28 DIAGNOSIS — Z79899 Other long term (current) drug therapy: Secondary | ICD-10-CM | POA: Diagnosis not present

## 2017-05-28 DIAGNOSIS — Z7982 Long term (current) use of aspirin: Secondary | ICD-10-CM | POA: Diagnosis not present

## 2017-05-28 DIAGNOSIS — M542 Cervicalgia: Secondary | ICD-10-CM | POA: Insufficient documentation

## 2017-05-28 DIAGNOSIS — Y999 Unspecified external cause status: Secondary | ICD-10-CM | POA: Insufficient documentation

## 2017-05-28 DIAGNOSIS — R51 Headache: Secondary | ICD-10-CM

## 2017-05-28 DIAGNOSIS — Y939 Activity, unspecified: Secondary | ICD-10-CM | POA: Insufficient documentation

## 2017-05-28 DIAGNOSIS — R519 Headache, unspecified: Secondary | ICD-10-CM

## 2017-05-28 DIAGNOSIS — S20219A Contusion of unspecified front wall of thorax, initial encounter: Secondary | ICD-10-CM | POA: Diagnosis not present

## 2017-05-28 DIAGNOSIS — S29001A Unspecified injury of muscle and tendon of front wall of thorax, initial encounter: Secondary | ICD-10-CM | POA: Diagnosis present

## 2017-05-28 DIAGNOSIS — Y929 Unspecified place or not applicable: Secondary | ICD-10-CM | POA: Diagnosis not present
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
## 2017-05-28 NOTE — ED Notes (Signed)
Pt came and handed labels to registration and stated that she was leaving

## 2017-05-29 ENCOUNTER — Emergency Department (HOSPITAL_COMMUNITY): Payer: Medicaid Other

## 2017-05-29 MED ORDER — IBUPROFEN 800 MG PO TABS
800.0000 mg | ORAL_TABLET | Freq: Once | ORAL | Status: AC
  Administered 2017-05-29: 800 mg via ORAL
  Filled 2017-05-29: qty 1

## 2017-05-29 MED ORDER — CYCLOBENZAPRINE HCL 10 MG PO TABS: 10.0000 mg | ORAL_TABLET | Freq: Two times a day (BID) | ORAL | 0 refills | Status: DC | PRN

## 2017-05-29 MED ORDER — IBUPROFEN 800 MG PO TABS: 800.0000 mg | ORAL_TABLET | Freq: Three times a day (TID) | ORAL | 0 refills | Status: DC

## 2017-05-29 NOTE — Discharge Instructions (Signed)
We recommend use of ibuprofen as prescribed for pain or headache. You may take Flexeril for muscle spasms. Apply ice to areas of pain/injury to help improve swelling. Follow-up with a primary care doctor as needed.

## 2017-05-29 NOTE — ED Notes (Signed)
PT states understanding of care given, follow up care, and medication prescribed. PT ambulated from ED to car with a steady gait. 

## 2017-05-29 NOTE — ED Notes (Signed)
PA at bedside.

## 2017-05-29 NOTE — ED Notes (Signed)
PT doesn't have any deformities noted at this time, no bruising but is complaining of pain in different areas.
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
## 2017-05-29 NOTE — ED Notes (Addendum)
Patient transported to x-ray. ?

## 2017-06-03 ENCOUNTER — Encounter (HOSPITAL_COMMUNITY): Payer: Self-pay

## 2017-06-03 ENCOUNTER — Emergency Department (HOSPITAL_COMMUNITY)
Admission: EM | Admit: 2017-06-03 | Discharge: 2017-06-03 | Disposition: A | Discharge status: Home / Self Care | Payer: Medicaid Other | Attending: Emergency Medicine | Admitting: Emergency Medicine

## 2017-06-03 DIAGNOSIS — S8012XA Contusion of left lower leg, initial encounter: Secondary | ICD-10-CM | POA: Insufficient documentation

## 2017-06-03 DIAGNOSIS — S80811A Abrasion, right lower leg, initial encounter: Secondary | ICD-10-CM | POA: Insufficient documentation

## 2017-06-03 DIAGNOSIS — Y999 Unspecified external cause status: Secondary | ICD-10-CM | POA: Diagnosis not present

## 2017-06-03 DIAGNOSIS — S8981XA Other specified injuries of right lower leg, initial encounter: Secondary | ICD-10-CM | POA: Diagnosis present

## 2017-06-03 DIAGNOSIS — Y939 Activity, unspecified: Secondary | ICD-10-CM | POA: Insufficient documentation

## 2017-06-03 DIAGNOSIS — Y929 Unspecified place or not applicable: Secondary | ICD-10-CM | POA: Diagnosis not present

## 2017-06-03 DIAGNOSIS — W108XXA Fall (on) (from) other stairs and steps, initial encounter: Secondary | ICD-10-CM | POA: Diagnosis not present

## 2017-06-03 DIAGNOSIS — W19XXXA Unspecified fall, initial encounter: Secondary | ICD-10-CM

## 2017-06-03 MED ORDER — BACITRACIN ZINC 500 UNIT/GM EX OINT
1.0000 "application " | TOPICAL_OINTMENT | Freq: Two times a day (BID) | CUTANEOUS | Status: DC
  Administered 2017-06-03: 1 via TOPICAL
  Filled 2017-06-03: qty 1.8

## 2017-06-03 MED ORDER — IBUPROFEN 800 MG PO TABS
800.0000 mg | ORAL_TABLET | Freq: Once | ORAL | Status: AC
  Administered 2017-06-03: 800 mg via ORAL
  Filled 2017-06-03: qty 1

## 2017-06-03 MED ORDER — IBUPROFEN 800 MG PO TABS: 800.0000 mg | ORAL_TABLET | Freq: Three times a day (TID) | ORAL | 0 refills | Status: DC

## 2017-06-03 NOTE — ED Triage Notes (Signed)
Pt fell this am and tripped on steps, she complains of left leg pain and an abrasion on her right leg

## 2017-06-03 NOTE — Discharge Instructions (Signed)
Use crutches when walking to prevent from putting weight on your left foot as needed. Take ibuprofen as prescribed for pain. Ice areas of pain/injury 3-4 times per day to limit swelling. Keep your legs elevated as much as possible. Follow-up with a primary care doctor to ensure resolution of symptoms.
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
## 2017-08-02 ENCOUNTER — Emergency Department (HOSPITAL_COMMUNITY)
Admission: EM | Admit: 2017-08-02 | Discharge: 2017-08-02 | Disposition: A | Discharge status: Home / Self Care | Payer: Medicaid Other | Attending: Emergency Medicine | Admitting: Emergency Medicine

## 2017-08-02 ENCOUNTER — Encounter (HOSPITAL_COMMUNITY): Payer: Self-pay | Admitting: *Deleted

## 2017-08-02 DIAGNOSIS — Z5321 Procedure and treatment not carried out due to patient leaving prior to being seen by health care provider: Secondary | ICD-10-CM | POA: Insufficient documentation

## 2017-08-02 DIAGNOSIS — L299 Pruritus, unspecified: Secondary | ICD-10-CM | POA: Insufficient documentation

## 2017-08-02 LAB — POC URINE PREG, ED: PREG TEST UR: NEGATIVE

## 2017-08-02 NOTE — ED Notes (Signed)
No answer when called for room x 3 

## 2017-08-02 NOTE — ED Triage Notes (Signed)
Pt here for pregnancy, STD Check, bumps on body with itching, and painful intercourse.

## 2017-09-08 ENCOUNTER — Encounter: Payer: Self-pay | Admitting: Internal Medicine

## 2017-09-08 ENCOUNTER — Ambulatory Visit (INDEPENDENT_AMBULATORY_CARE_PROVIDER_SITE_OTHER): Payer: Medicaid Other | Admitting: Internal Medicine

## 2017-09-08 ENCOUNTER — Other Ambulatory Visit (HOSPITAL_COMMUNITY)
Admission: RE | Admit: 2017-09-08 | Discharge: 2017-09-08 | Disposition: A | Discharge status: Home / Self Care | Payer: Medicaid Other | Source: Ambulatory Visit | Attending: Family Medicine | Admitting: Family Medicine

## 2017-09-08 VITALS — BP 118/80 | HR 87 | Temp 98.7°F | Ht 62.0 in | Wt 240.0 lb

## 2017-09-08 DIAGNOSIS — B9689 Other specified bacterial agents as the cause of diseases classified elsewhere: Secondary | ICD-10-CM | POA: Diagnosis not present

## 2017-09-08 DIAGNOSIS — N760 Acute vaginitis: Secondary | ICD-10-CM | POA: Insufficient documentation

## 2017-09-08 DIAGNOSIS — N644 Mastodynia: Secondary | ICD-10-CM | POA: Insufficient documentation

## 2017-09-08 DIAGNOSIS — Z202 Contact with and (suspected) exposure to infections with a predominantly sexual mode of transmission: Secondary | ICD-10-CM | POA: Insufficient documentation

## 2017-09-08 DIAGNOSIS — R300 Dysuria: Secondary | ICD-10-CM

## 2017-09-08 DIAGNOSIS — Z113 Encounter for screening for infections with a predominantly sexual mode of transmission: Secondary | ICD-10-CM | POA: Diagnosis not present

## 2017-09-08 LAB — POCT WET PREP (WET MOUNT)
Clue Cells Wet Prep Whiff POC: POSITIVE
Trichomonas Wet Prep HPF POC: ABSENT

## 2017-09-08 LAB — POCT URINE PREGNANCY: PREG TEST UR: NEGATIVE

## 2017-09-08 LAB — POCT URINALYSIS DIP (MANUAL ENTRY)
BILIRUBIN UA: NEGATIVE mg/dL
Bilirubin, UA: NEGATIVE
Blood, UA: NEGATIVE
Glucose, UA: NEGATIVE mg/dL
LEUKOCYTES UA: NEGATIVE
NITRITE UA: NEGATIVE
PH UA: 7 (ref 5.0–8.0)
PROTEIN UA: NEGATIVE mg/dL
Spec Grav, UA: 1.02 (ref 1.010–1.025)
UROBILINOGEN UA: 0.2 U/dL

## 2017-09-08 MED ORDER — RANITIDINE HCL 150 MG PO CAPS: ORAL_CAPSULE | ORAL | 0 refills | Status: DC

## 2017-09-08 MED ORDER — METRONIDAZOLE 500 MG PO TABS: 500.0000 mg | ORAL_TABLET | Freq: Two times a day (BID) | ORAL | 0 refills | Status: DC

## 2017-09-08 NOTE — Progress Notes (Signed)
Date of Visit: 09/08/2017   HPI:  Concern for STD: The patient states that 3 days ago she noticed some pain with urination. She had not noticed this feeling before 3 days ago and has not noticed it since. She would not describe the sensation as burning, but simply as painful. She denies changes in urinary frequency, although, she states that she "holds in her pee." She denies changes in the color of her urine or blood in her urine. She has noticed chills or feelings of being cold for the last 1-2 months. She denies noticing vaginal discharge, but does endorse some itching. She is sexually active with a new partner since August and states that her partner has notice feeling strange when he urinates. She has had Chlamydia in the past and says that this feels different. She does not use contraception besides her partner "pulling-out," but states that she does not currently want to become pregnant. Her last period was in the middle of September. She has previously had regular periods. She does however endorse frequent nausea.     Breast tenderness: The patient has noticed tenderness of bilateral breasts for the last couple months. She states that the tenderness is constant, made worse by touching the breasts, not improved by anything and no associated with her menstrual cycle. She denies associated shoulder or back pain, discharge from her nipples or changes in the pain if she is or is not wearing a bra.   ROS: See HPI.  Family Histroy: Patient denies any family history of breast cancer in a young age  PHYSICAL EXAM: BP 118/80   Pulse 87   Temp 98.7 F (37.1 C) (Oral)   Ht 5\' 2"  (1.575 m)   Wt 240 lb (108.9 kg)   LMP 08/09/2017 (Approximate)   SpO2 99%   Breastfeeding? No   BMI 43.90 kg/m  Gen: Well Appearing, NAD, obese Pelvic: Examination with speculum reveals frothy white discharge on cervix and vaginal wall, cervix is mildly erythematous, no masses noted on vaginal wall or cervix, no  tenderness to palpation on bimanual exam Breast: large breasts. no masses noted, breasts not tender to palpation, no rashes or skin retractions noted, no skin changes or discharge from nipples evident. Palpation of axilla normal bilaterally   ASSESSMENT/PLAN:  Possible exposure to STD The patient endorses one day of pain during urination, has a new sexual partner with whom she does not use barrier protection and notes vaginal itching. These symptoms are supported by findings of frothy whites discharge on exam. Based on these symptoms and the patient's concern, it is appropriate to test for STDs and vaginal infections.  -test for HIV, RPR and Gonorrhea/Chlamydia  -Wet prep to assess for yeast, Bacterial Vaginosis and trichomonas infection- found to be positive for bacterial vaginosis -treat with 7 days of Metronidazole- patient counseled to avoid drinking alcohol while taking this medication  -call patient with results of other testing   Breast tenderness in female This symptom is not associated with menses, wearing bras, discharge from the patient's nipple, skin changes and is not made better or worse by anything the patient can recall. Based on the benign breast exam, the patient's young age and a lack of significant family history of breast cancer, no further imaging or workup is needed  -B-HCG testing negative  -provided reassurance for the patient   FOLLOW UP: Follow up with PCP or if symptoms do not resolve   [name] [name], Medical Student  ________________________________________________________________________ I agree with the medical  student's note above and have made appropriate changes to the  assessment and plan.  [name] Boer, MD Family Medicine, PGY 3 09/08/2017 5:23 PM

## 2017-09-08 NOTE — Assessment & Plan Note (Signed)
This symptom is not associated with menses, wearing bras, discharge from the patient's nipple, skin changes and is not made better or worse by anything the patient can recall. Based on the benign breast exam, the patient's young age and a lack of significant family history of breast cancer, no further imaging or workup is needed  -B-HCG testing negative  -provided reassurance for the patient

## 2017-09-08 NOTE — Patient Instructions (Signed)

## 2017-09-08 NOTE — Assessment & Plan Note (Signed)
The patient endorses one day of pain during urination, has a new sexual partner with whom she does not use barrier protection and notes vaginal itching. These symptoms are supported by findings of frothy whites discharge on exam. Based on these symptoms and the patient's concern, it is appropriate to test for STDs and vaginal infections.  -test for HIV, RPR and Gonorrhea/Chlamydia  -Wet prep to assess for yeast, Bacterial Vaginosis and trichomonas infection- found to be positive for bacterial vaginosis -treat with 7 days of Metronidazole- patient counseled to avoid drinking alcohol while taking this medication  -call patient with results of other testing

## 2017-09-09 LAB — CERVICOVAGINAL ANCILLARY ONLY
Chlamydia: NEGATIVE
NEISSERIA GONORRHEA: NEGATIVE

## 2017-09-09 LAB — HIV ANTIBODY (ROUTINE TESTING W REFLEX): HIV SCREEN 4TH GENERATION: NONREACTIVE

## 2017-09-09 LAB — RPR: RPR Ser Ql: NONREACTIVE

## 2017-09-10 ENCOUNTER — Telehealth: Payer: Self-pay | Admitting: Internal Medicine

## 2017-09-10 ENCOUNTER — Encounter: Payer: Self-pay | Admitting: Internal Medicine

## 2017-09-10 NOTE — Telephone Encounter (Signed)
Patient returning call from provider. Pt advised that her test results were negative.  She will receive a letter in the mail.  Derl Barrow, RN

## 2017-09-10 NOTE — Telephone Encounter (Signed)
Attempted to call patient. Went to voicemail left message to call back.   Wanted to inform her of her negative gonorrhea and chlamydia. Will also send a letter.

## 2017-09-15 ENCOUNTER — Emergency Department (HOSPITAL_COMMUNITY)
Admission: EM | Admit: 2017-09-15 | Discharge: 2017-09-15 | Disposition: A | Discharge status: Home / Self Care | Payer: Medicaid Other | Attending: Emergency Medicine | Admitting: Emergency Medicine

## 2017-09-15 ENCOUNTER — Emergency Department (HOSPITAL_COMMUNITY): Payer: Medicaid Other

## 2017-09-15 ENCOUNTER — Encounter (HOSPITAL_COMMUNITY): Payer: Self-pay | Admitting: *Deleted

## 2017-09-15 DIAGNOSIS — Z79899 Other long term (current) drug therapy: Secondary | ICD-10-CM | POA: Diagnosis not present

## 2017-09-15 DIAGNOSIS — K8020 Calculus of gallbladder without cholecystitis without obstruction: Secondary | ICD-10-CM | POA: Insufficient documentation

## 2017-09-15 DIAGNOSIS — R1030 Lower abdominal pain, unspecified: Secondary | ICD-10-CM | POA: Diagnosis present

## 2017-09-15 DIAGNOSIS — R112 Nausea with vomiting, unspecified: Secondary | ICD-10-CM | POA: Insufficient documentation

## 2017-09-15 DIAGNOSIS — N898 Other specified noninflammatory disorders of vagina: Secondary | ICD-10-CM | POA: Insufficient documentation

## 2017-09-15 LAB — CBC
HEMATOCRIT: 37.7 % (ref 36.0–46.0)
HEMOGLOBIN: 12.3 g/dL (ref 12.0–15.0)
MCH: 31.2 pg (ref 26.0–34.0)
MCHC: 32.6 g/dL (ref 30.0–36.0)
MCV: 95.7 fL (ref 78.0–100.0)
Platelets: 407 10*3/uL — ABNORMAL HIGH (ref 150–400)
RBC: 3.94 MIL/uL (ref 3.87–5.11)
RDW: 13.5 % (ref 11.5–15.5)
WBC: 7 10*3/uL (ref 4.0–10.5)

## 2017-09-15 LAB — URINALYSIS, ROUTINE W REFLEX MICROSCOPIC
BACTERIA UA: NONE SEEN
BILIRUBIN URINE: NEGATIVE
Glucose, UA: NEGATIVE mg/dL
HGB URINE DIPSTICK: NEGATIVE
Ketones, ur: NEGATIVE mg/dL
NITRITE: NEGATIVE
PROTEIN: NEGATIVE mg/dL
Specific Gravity, Urine: 1.021 (ref 1.005–1.030)
pH: 5 (ref 5.0–8.0)

## 2017-09-15 LAB — COMPREHENSIVE METABOLIC PANEL
ALBUMIN: 3.7 g/dL (ref 3.5–5.0)
ALT: 11 U/L — ABNORMAL LOW (ref 14–54)
AST: 17 U/L (ref 15–41)
Alkaline Phosphatase: 52 U/L (ref 38–126)
Anion gap: 6 (ref 5–15)
BILIRUBIN TOTAL: 0.4 mg/dL (ref 0.3–1.2)
BUN: 8 mg/dL (ref 6–20)
CHLORIDE: 105 mmol/L (ref 101–111)
CO2: 30 mmol/L (ref 22–32)
Calcium: 9 mg/dL (ref 8.9–10.3)
Creatinine, Ser: 0.84 mg/dL (ref 0.44–1.00)
GFR calc Af Amer: >60 mL/min (ref >60)
GFR calc non Af Amer: >60 mL/min (ref >60)
GLUCOSE: 88 mg/dL (ref 65–99)
POTASSIUM: 3.4 mmol/L — AB (ref 3.5–5.1)
SODIUM: 141 mmol/L (ref 135–145)
TOTAL PROTEIN: 7.4 g/dL (ref 6.5–8.1)

## 2017-09-15 LAB — I-STAT BETA HCG BLOOD, ED (MC, WL, AP ONLY)

## 2017-09-15 LAB — LIPASE, BLOOD: LIPASE: 30 U/L (ref 11–51)

## 2017-09-15 MED ORDER — SODIUM CHLORIDE 0.9 % IV BOLUS (SEPSIS)
1000.0000 mL | Freq: Once | INTRAVENOUS | Status: AC
  Administered 2017-09-15: 1000 mL via INTRAVENOUS

## 2017-09-15 MED ORDER — IOPAMIDOL (ISOVUE-300) INJECTION 61%
INTRAVENOUS | Status: AC
  Administered 2017-09-15: 100 mL
  Filled 2017-09-15: qty 100

## 2017-09-15 MED ORDER — ONDANSETRON 4 MG PO TBDP: 4.0000 mg | ORAL_TABLET | Freq: Three times a day (TID) | ORAL | 0 refills | Status: DC | PRN

## 2017-09-15 MED ORDER — ONDANSETRON HCL 4 MG/2ML IJ SOLN
4.0000 mg | Freq: Once | INTRAMUSCULAR | Status: AC
  Administered 2017-09-15: 4 mg via INTRAVENOUS
  Filled 2017-09-15: qty 2
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
## 2017-09-15 NOTE — ED Triage Notes (Signed)
Pt complains of n/v/d and lower abdominal pain since last night. Pt states pain is worse with bowel movement. Pt states eating makes her nauseas.

## 2017-09-15 NOTE — ED Triage Notes (Signed)
Per EMS abdominal pain for 2 weeks-nausea and vomiting as well-right upper and lower quadrants, tender to touch-was seen at [facility] for same symptoms 2 weeks ago-has not followed up with a PCP

## 2017-10-27 ENCOUNTER — Ambulatory Visit: Payer: Self-pay | Admitting: Student

## 2017-12-09 ENCOUNTER — Other Ambulatory Visit: Payer: Self-pay

## 2017-12-09 ENCOUNTER — Encounter: Payer: Self-pay | Admitting: Family Medicine

## 2017-12-09 ENCOUNTER — Other Ambulatory Visit (HOSPITAL_COMMUNITY)
Admission: RE | Admit: 2017-12-09 | Discharge: 2017-12-09 | Disposition: A | Discharge status: Home / Self Care | Payer: Medicaid Other | Source: Ambulatory Visit | Attending: Family Medicine | Admitting: Family Medicine

## 2017-12-09 ENCOUNTER — Ambulatory Visit (INDEPENDENT_AMBULATORY_CARE_PROVIDER_SITE_OTHER): Payer: Medicaid Other | Admitting: Family Medicine

## 2017-12-09 VITALS — BP 122/84 | HR 96 | Temp 98.4°F | Ht 62.0 in | Wt 233.0 lb

## 2017-12-09 DIAGNOSIS — B9689 Other specified bacterial agents as the cause of diseases classified elsewhere: Secondary | ICD-10-CM

## 2017-12-09 DIAGNOSIS — Z113 Encounter for screening for infections with a predominantly sexual mode of transmission: Secondary | ICD-10-CM | POA: Diagnosis not present

## 2017-12-09 DIAGNOSIS — R300 Dysuria: Secondary | ICD-10-CM | POA: Diagnosis present

## 2017-12-09 DIAGNOSIS — N390 Urinary tract infection, site not specified: Secondary | ICD-10-CM | POA: Diagnosis not present

## 2017-12-09 DIAGNOSIS — N760 Acute vaginitis: Secondary | ICD-10-CM

## 2017-12-09 DIAGNOSIS — A5602 Chlamydial vulvovaginitis: Secondary | ICD-10-CM | POA: Diagnosis not present

## 2017-12-09 LAB — POCT URINALYSIS DIP (MANUAL ENTRY)
BILIRUBIN UA: NEGATIVE
Blood, UA: NEGATIVE
GLUCOSE UA: NEGATIVE mg/dL
Ketones, POC UA: NEGATIVE mg/dL
NITRITE UA: NEGATIVE
Protein Ur, POC: NEGATIVE mg/dL
Spec Grav, UA: 1.025 (ref 1.010–1.025)
UROBILINOGEN UA: 0.2 U/dL
pH, UA: 6 (ref 5.0–8.0)

## 2017-12-09 LAB — POCT WET PREP (WET MOUNT)
CLUE CELLS WET PREP WHIFF POC: POSITIVE
TRICHOMONAS WET PREP HPF POC: ABSENT

## 2017-12-09 LAB — POCT UA - MICROSCOPIC ONLY: Epithelial cells, urine per micros: >20

## 2017-12-09 LAB — POCT URINE PREGNANCY: PREG TEST UR: NEGATIVE

## 2017-12-09 MED ORDER — CEPHALEXIN 500 MG PO CAPS: 500.0000 mg | ORAL_CAPSULE | Freq: Two times a day (BID) | ORAL | 0 refills | Status: AC

## 2017-12-09 MED ORDER — METRONIDAZOLE 500 MG PO TABS
500.0000 mg | ORAL_TABLET | Freq: Two times a day (BID) | ORAL | 0 refills | Status: DC
Start: 2017-12-09 — End: 2018-02-28

## 2017-12-09 NOTE — Progress Notes (Signed)
CC: DYSURIA HPI: [name] is a 32 year old female who comes in for dysuria for one week. She believes that this is likely a UTI or possibly an STD. Patient does not use condoms when sexually active with her boyfriend, but has an additional female partner that she uses condoms with. She is actively trying to get pregnant at this time. LMP on 11/20/17.  Pain urinating started 7 days ago. Pain is: Consistently 6/10 Medications tried: AZO cranberry supplements, increasing hydration, cranberry juice Any antibiotics in the last 30 days: No More than 3 UTIs in the last 12 months: No STD exposure: Possible Possibly pregnant: Negative UPT  Symptoms Urgency: yes Frequency: yes Blood in urine: no Pain in back: patient endorses bilateral back pain but she believes this is her typical musculoskeletal pain. Fever: No Vaginal discharge: Denies Mouth Ulcers: No  Review of Symptoms - see HPI PMH: Depression, obesity, PTSD, prior STI, mild cognitive impairment, ADHD, HSV2  Exam: HEENT: Normocephalic, PERRLA, no cervical lymphadenopathy, oropharynx clear CV: RRR, no murmurs, rubs, or gallops.  Pulm: CTAB, no wheezes or crackles.  Abd: No suprapubic tenderness, soft, non-tender, positive bowel sounds. Back: No CVA tenderness, but back diffusely tender to palpation. GU: No active lesions, no adnexal masses or suprapubic tenderness, white malodorous discharge present.   Assessment: [name] is a 32 year old female presenting with dysuria likely due to UTI with superimposed Bacterial Vaginosis given clue cells present on wet prep and symptoms of dysuria, urgency, and increased frequency.   #Bacterial Vaginosis - Metronidazole 500 mg PO BID for 7 days  #UTI: - Keflex 100 mg PO BID for several days - Advised on keeping hydrated and strict return precautions for fever or systemic signs of worsening  #STI Testing: - GC pending, will call with results - Trich negative - HIV/RPR pending, will call  with results.  Dot Lanes, MS3

## 2017-12-09 NOTE — Patient Instructions (Signed)
Urinary Tract Infection, Adult A urinary tract infection (UTI) is an infection of any part of the urinary tract. The urinary tract includes the:  Kidneys.  Ureters.  Bladder.  Urethra.  These organs make, store, and get rid of pee (urine) in the body. Follow these instructions at home:  Take over-the-counter and prescription medicines only as told by your doctor.  If you were prescribed an antibiotic medicine, take it as told by your doctor. Do not stop taking the antibiotic even if you start to feel better.  Avoid the following drinks: ? Alcohol. ? Caffeine. ? Tea. ? Carbonated drinks.  Drink enough fluid to keep your pee clear or pale yellow.  Keep all follow-up visits as told by your doctor. This is important.  Make sure to: ? Empty your bladder often and completely. Do not to hold pee for long periods of time. ? Empty your bladder before and after sex. ? Wipe from front to back after a bowel movement if you are female. Use each tissue one time when you wipe. Contact a doctor if:  You have back pain.  You have a fever.  You feel sick to your stomach (nauseous).  You throw up (vomit).  Your symptoms do not get better after 3 days.  Your symptoms go away and then come back. Get help right away if:  You have very bad back pain.  You have very bad lower belly (abdominal) pain.  You are throwing up and cannot keep down any medicines or water. This information is not intended to replace advice given to you by your health care provider. Make sure you discuss any questions you have with your health care provider. Document Released: 05/05/2008 Document Revised: 04/24/2016 Document Reviewed: 10/08/2015 Elsevier Interactive Patient Education  2018 [employer] Bacterial Vaginosis Bacterial vaginosis is an infection of the vagina. It happens when too many germs (bacteria) grow in the vagina. This infection puts you at risk for infections from sex (STIs). Treating this  infection can lower your risk for some STIs. You should also treat this if you are pregnant. It can cause your baby to be born early. Follow these instructions at home: Medicines  Take over-the-counter and prescription medicines only as told by your doctor.  Take or use your antibiotic medicine as told by your doctor. Do not stop taking or using it even if you start to feel better. General instructions  If you your sexual partner is a woman, tell her that you have this infection. She needs to get treatment if she has symptoms. If you have a female partner, he does not need to be treated.  During treatment: ? Avoid sex. ? Do not douche. ? Avoid alcohol as told. ? Avoid breastfeeding as told.  Drink enough fluid to keep your pee (urine) clear or pale yellow.  Keep your vagina and butt (rectum) clean. ? Wash the area with warm water every day. ? Wipe from front to back after you use the toilet.  Keep all follow-up visits as told by your doctor. This is important. Preventing this condition  Do not douche.  Use only warm water to wash around your vagina.  Use protection when you have sex. This includes: ? Latex condoms. ? Dental dams.  Limit how many people you have sex with. It is best to only have sex with the same person (be monogamous).  Get tested for STIs. Have your partner get tested.  Wear underwear that is cotton or lined with cotton.  Avoid tight pants and pantyhose. This is most important in summer.  Do not use any products that have nicotine or tobacco in them. These include cigarettes and e-cigarettes. If you need help quitting, ask your doctor.  Do not use illegal drugs.  Limit how much alcohol you drink. Contact a doctor if:  Your symptoms do not get better, even after you are treated.  You have more discharge or pain when you pee (urinate).  You have a fever.  You have pain in your belly (abdomen).  You have pain with sex.  Your bleed from your  vagina between periods. Summary  This infection happens when too many germs (bacteria) grow in the vagina.  Treating this condition can lower your risk for some infections from sex (STIs).  You should also treat this if you are pregnant. It can cause early (premature) birth.  Do not stop taking or using your antibiotic medicine even if you start to feel better. This information is not intended to replace advice given to you by your health care provider. Make sure you discuss any questions you have with your health care provider. Document Released: 08/26/2008 Document Revised: 08/02/2016 Document Reviewed: 08/02/2016 Elsevier Interactive Patient Education  2017 [employer].

## 2017-12-10 ENCOUNTER — Telehealth (HOSPITAL_COMMUNITY): Payer: Self-pay | Admitting: Family Medicine

## 2017-12-10 LAB — CERVICOVAGINAL ANCILLARY ONLY
Chlamydia: POSITIVE — AB
Neisseria Gonorrhea: NEGATIVE

## 2017-12-10 LAB — SYPHILIS: RPR W/REFLEX TO RPR TITER AND TREPONEMAL ANTIBODIES, TRADITIONAL SCREENING AND DIAGNOSIS ALGORITHM: RPR Ser Ql: NONREACTIVE

## 2017-12-10 LAB — HIV ANTIBODY (ROUTINE TESTING W REFLEX): HIV SCREEN 4TH GENERATION: NONREACTIVE

## 2017-12-10 NOTE — Telephone Encounter (Signed)
error 

## 2017-12-10 NOTE — Telephone Encounter (Signed)
[name] [name] tested positive for Chlamydia. Attempted to call her number on file, went right to voicemail. Left voicemail for her to call the clinic back ASAP.   If she calls back, please tell her she is positive for Chlamydia and she will need to come into the clinic ASAP for a nurse visit to get 1 Gram of Azithromycin.   Smitty Cords, MD [place], PGY-3

## 2017-12-11 LAB — URINE CULTURE

## 2017-12-11 NOTE — Telephone Encounter (Signed)
Pt has appt for nurse visit on 12/15/17. Barrie Sigmund, Salome Spotted, CMA

## 2017-12-15 ENCOUNTER — Ambulatory Visit (INDEPENDENT_AMBULATORY_CARE_PROVIDER_SITE_OTHER): Payer: Medicaid Other | Admitting: *Deleted

## 2017-12-15 DIAGNOSIS — A749 Chlamydial infection, unspecified: Secondary | ICD-10-CM | POA: Diagnosis not present

## 2017-12-15 MED ORDER — AZITHROMYCIN 500 MG PO TABS
1000.0000 mg | ORAL_TABLET | Freq: Once | ORAL | Status: AC
  Administered 2017-12-15: 1000 mg via ORAL

## 2017-12-15 NOTE — Progress Notes (Signed)
   Patient in nurse clinic today for STD treatment of chlamydia  Patient advised to abstain from sex for 7-10 days after treatment or when partner has been tested/treated.  Azithromycin 1 GM PO x 1 given per Dr. Thad Ranger orders.  Advised to use condoms with all sexual activity.  Patient verbalized understanding. Bag of condoms given to pt. STD report form fax completed and faxed to [facility] Department at [phone] (STD department).  Velora Heckler, RN

## 2017-12-15 NOTE — Patient Instructions (Signed)
Chlamydia, Female Chlamydia is an STD (sexually transmitted disease). This is an infection that spreads through sexual contact. If it is not treated, it can cause serious problems. It must be treated with antibiotic medicine. Sometimes, you may not have symptoms (asymptomatic). When you have symptoms, they can include:  Burning when you pee (urinate).  Peeing often.  Fluid (discharge) coming from the vagina.  Redness, soreness, and swelling (inflammation) of the butt (rectum).  Bleeding or fluid coming from the butt.  Belly (abdominal) pain.  Pain during sex.  Bleeding between periods.  Itching, burning, or redness in the eyes.  Fluid coming from the eyes.  Follow these instructions at home: Medicines  Take over-the-counter and prescription medicines only as told by your doctor.  Take your antibiotic medicine as told by your doctor. Do not stop taking the antibiotic even if you start to feel better. Sexual activity  Tell sex partners about your infection. Sex partners are people you had oral, anal, or vaginal sex with within 60 days of when you started getting sick. They need treatment, too.  Do not have sex until: ? You and your sex partners have been treated. ? Your doctor says it is okay.  If you have a single dose treatment, wait 7 days before having sex. General instructions  It is up to you to get your test results. Ask your doctor when your results will be ready.  Get a lot of rest.  Eat healthy foods.  Drink enough fluid to keep your pee (urine) clear or pale yellow.  Keep all follow-up visits as told by your doctor. You may need tests after 3 months. Preventing chlamydia  The only way to prevent chlamydia is not to have sex. To lower your risk: ? Use latex condoms correctly. Do this every time you have sex. ? Avoid having many sex partners. ? Ask if your partner has been tested for STDs and if he or she had negative results. Contact a doctor if:  You  get new symptoms.  You do not get better with treatment.  You have a fever or chills.  You have pain during sex. Get help right away if:  Your pain gets worse and does not get better with medicine.  You get flu-like symptoms, such as: ? Night sweats. ? Sore throat. ? Muscle aches.  You feel sick to your stomach (nauseous).  You throw up (vomit).  You have trouble swallowing.  You have bleeding: ? Between periods. ? After sex.  You have irregular periods.  You have belly pain that does not get better with medicine.  You have lower back pain that does not get better with medicine.  You feel weak or dizzy.  You pass out (faint).  You are pregnant and you get symptoms of chlamydia. Summary  Chlamydia is an infection that spreads through sexual contact.  Sometimes, chlamydia can cause no symptoms (asymptomatic).  Do not have sex until your doctor says it is okay.  All sex partners will have to be treated for chlamydia. This information is not intended to replace advice given to you by your health care provider. Make sure you discuss any questions you have with your health care provider. Document Released: 08/26/2008 Document Revised: 11/06/2016 Document Reviewed: 11/06/2016 Elsevier Interactive Patient Education  2017 Elsevier Inc.  

## 2017-12-28 ENCOUNTER — Encounter (HOSPITAL_COMMUNITY): Payer: Self-pay

## 2017-12-28 ENCOUNTER — Emergency Department (HOSPITAL_COMMUNITY)
Admission: EM | Admit: 2017-12-28 | Discharge: 2017-12-28 | Disposition: A | Discharge status: Home / Self Care | Payer: Medicaid Other | Attending: Emergency Medicine | Admitting: Emergency Medicine

## 2017-12-28 DIAGNOSIS — R109 Unspecified abdominal pain: Secondary | ICD-10-CM | POA: Diagnosis present

## 2017-12-28 DIAGNOSIS — Z79899 Other long term (current) drug therapy: Secondary | ICD-10-CM | POA: Diagnosis not present

## 2017-12-28 DIAGNOSIS — N898 Other specified noninflammatory disorders of vagina: Secondary | ICD-10-CM | POA: Diagnosis not present

## 2017-12-28 DIAGNOSIS — R300 Dysuria: Secondary | ICD-10-CM | POA: Insufficient documentation

## 2017-12-28 LAB — COMPREHENSIVE METABOLIC PANEL
ALBUMIN: 3.6 g/dL (ref 3.5–5.0)
ALK PHOS: 42 U/L (ref 38–126)
ALT: 5 U/L — ABNORMAL LOW (ref 14–54)
AST: 19 U/L (ref 15–41)
Anion gap: 8 (ref 5–15)
BILIRUBIN TOTAL: 0.4 mg/dL (ref 0.3–1.2)
BUN: 13 mg/dL (ref 6–20)
CALCIUM: 9.1 mg/dL (ref 8.9–10.3)
CO2: 26 mmol/L (ref 22–32)
Chloride: 105 mmol/L (ref 101–111)
Creatinine, Ser: 0.75 mg/dL (ref 0.44–1.00)
GFR calc Af Amer: >60 mL/min (ref >60)
GLUCOSE: 71 mg/dL (ref 65–99)
Potassium: 3.6 mmol/L (ref 3.5–5.1)
Sodium: 139 mmol/L (ref 135–145)
TOTAL PROTEIN: 6.8 g/dL (ref 6.5–8.1)

## 2017-12-28 LAB — I-STAT BETA HCG BLOOD, ED (MC, WL, AP ONLY): I-stat hCG, quantitative: <5 m[IU]/mL (ref <5)

## 2017-12-28 LAB — CBC
HCT: 37.6 % (ref 36.0–46.0)
Hemoglobin: 12.1 g/dL (ref 12.0–15.0)
MCH: 31.1 pg (ref 26.0–34.0)
MCHC: 32.2 g/dL (ref 30.0–36.0)
MCV: 96.7 fL (ref 78.0–100.0)
PLATELETS: 387 10*3/uL (ref 150–400)
RBC: 3.89 MIL/uL (ref 3.87–5.11)
RDW: 13.1 % (ref 11.5–15.5)
WBC: 5.9 10*3/uL (ref 4.0–10.5)

## 2017-12-28 LAB — URINALYSIS, ROUTINE W REFLEX MICROSCOPIC
Bilirubin Urine: NEGATIVE
GLUCOSE, UA: NEGATIVE mg/dL
Ketones, ur: NEGATIVE mg/dL
NITRITE: NEGATIVE
PH: 6 (ref 5.0–8.0)
Protein, ur: NEGATIVE mg/dL
SPECIFIC GRAVITY, URINE: 1.019 (ref 1.005–1.030)

## 2017-12-28 LAB — WET PREP, GENITAL
CLUE CELLS WET PREP: NONE SEEN
Sperm: NONE SEEN
TRICH WET PREP: NONE SEEN
Yeast Wet Prep HPF POC: NONE SEEN

## 2017-12-28 LAB — LIPASE, BLOOD: Lipase: 37 U/L (ref 11–51)

## 2017-12-28 MED ORDER — AZITHROMYCIN 250 MG PO TABS
1000.0000 mg | ORAL_TABLET | Freq: Once | ORAL | Status: AC
  Administered 2017-12-28: 1000 mg via ORAL
  Filled 2017-12-28: qty 4

## 2017-12-28 MED ORDER — CEFTRIAXONE SODIUM 250 MG IJ SOLR
250.0000 mg | Freq: Once | INTRAMUSCULAR | Status: AC
  Administered 2017-12-28: 250 mg via INTRAMUSCULAR
  Filled 2017-12-28: qty 250

## 2017-12-28 MED ORDER — STERILE WATER FOR INJECTION IJ SOLN
INTRAMUSCULAR | Status: AC
  Administered 2017-12-28: 10 mL
  Filled 2017-12-28: qty 10

## 2017-12-28 NOTE — ED Notes (Signed)
Pelvic cart placed at the bedside.

## 2017-12-28 NOTE — ED Triage Notes (Signed)
Patient complains of intermittent lower abdominal pain with diarrhea and nausea. Complains of dysuria and vaginal discharge, NAD
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
## 2017-12-28 NOTE — Discharge Instructions (Signed)
You are advised to practice safe sex and have all partners evaluated and treated at the local health department. Also advised to follow with the health Department in 1-2 weeks to confirm effectiveness of treatment and receive additional education/evaluation. Return precautions given.

## 2017-12-28 NOTE — ED Notes (Signed)
Pt stable, ambulatory, and verbalizes understanding of d/c instructions.  

## 2017-12-29 LAB — GC/CHLAMYDIA PROBE AMP (~~LOC~~) NOT AT ARMC
CHLAMYDIA, DNA PROBE: NEGATIVE
Neisseria Gonorrhea: NEGATIVE

## 2018-01-06 ENCOUNTER — Telehealth: Payer: Self-pay

## 2018-01-06 DIAGNOSIS — F331 Major depressive disorder, recurrent, moderate: Secondary | ICD-10-CM

## 2018-01-06 MED ORDER — SERTRALINE HCL 50 MG PO TABS: 50.0000 mg | ORAL_TABLET | Freq: Every day | ORAL | 0 refills | Status: DC

## 2018-01-06 NOTE — Telephone Encounter (Signed)
Pt returning [employer] phone call. I read off all of the messages to her and she was completely lost as to why the pharmacy has requested a refill on prenatal vitamins because she is not pregnant. She was really confused and doesn't need a refill on them. What she needs a refill on is her zoloft/depression pills. I wasn't sure if she needed an appointment to get those refilled, but I scheduled her an appointment for Monday 2/11 anyway's, because she needs a couple of things she wants checked out.

## 2018-01-06 NOTE — Telephone Encounter (Signed)
Refilled her zoloft.  Thanks!

## 2018-01-06 NOTE — Telephone Encounter (Signed)
I have never met her or filled this prescription. Not sure about the indication. Doesn't seem she is pregnant or anemic. Recommend office visit. Thanks, Bretta Bang

## 2018-01-06 NOTE — Telephone Encounter (Signed)
[place] faxed a request for prenatal vitamins plus low iron. I am not seeing it on her med list. I have put the fax in your box for review. Ottis Stain, CMA

## 2018-01-06 NOTE — Telephone Encounter (Signed)
LVM for pt to call the office. If pt calls, please schedule an appt for pt to see Dr. Cyndia Skeeters for medication refill. Ottis Stain, CMA

## 2018-01-11 ENCOUNTER — Ambulatory Visit: Payer: Medicaid Other | Admitting: Student

## 2018-02-22 ENCOUNTER — Encounter: Payer: Medicaid Other | Admitting: Student

## 2018-02-28 ENCOUNTER — Encounter (HOSPITAL_COMMUNITY): Payer: Self-pay | Admitting: Obstetrics and Gynecology

## 2018-02-28 ENCOUNTER — Inpatient Hospital Stay (HOSPITAL_COMMUNITY)
Admission: AD | Admit: 2018-02-28 | Discharge: 2018-02-28 | Disposition: A | Discharge status: Home / Self Care | Payer: Medicaid Other | Source: Ambulatory Visit | Attending: Obstetrics and Gynecology | Admitting: Obstetrics and Gynecology

## 2018-02-28 ENCOUNTER — Inpatient Hospital Stay (HOSPITAL_COMMUNITY): Payer: Medicaid Other

## 2018-02-28 DIAGNOSIS — O26899 Other specified pregnancy related conditions, unspecified trimester: Secondary | ICD-10-CM

## 2018-02-28 DIAGNOSIS — N760 Acute vaginitis: Secondary | ICD-10-CM

## 2018-02-28 DIAGNOSIS — B9689 Other specified bacterial agents as the cause of diseases classified elsewhere: Secondary | ICD-10-CM | POA: Insufficient documentation

## 2018-02-28 DIAGNOSIS — Z3A01 Less than 8 weeks gestation of pregnancy: Secondary | ICD-10-CM | POA: Insufficient documentation

## 2018-02-28 DIAGNOSIS — R109 Unspecified abdominal pain: Secondary | ICD-10-CM | POA: Diagnosis not present

## 2018-02-28 DIAGNOSIS — R1030 Lower abdominal pain, unspecified: Secondary | ICD-10-CM | POA: Diagnosis present

## 2018-02-28 DIAGNOSIS — Z7982 Long term (current) use of aspirin: Secondary | ICD-10-CM | POA: Diagnosis not present

## 2018-02-28 DIAGNOSIS — O26891 Other specified pregnancy related conditions, first trimester: Secondary | ICD-10-CM

## 2018-02-28 DIAGNOSIS — O23591 Infection of other part of genital tract in pregnancy, first trimester: Secondary | ICD-10-CM | POA: Insufficient documentation

## 2018-02-28 DIAGNOSIS — O3680X0 Pregnancy with inconclusive fetal viability, not applicable or unspecified: Secondary | ICD-10-CM

## 2018-02-28 DIAGNOSIS — O0992 Supervision of high risk pregnancy, unspecified, second trimester: Secondary | ICD-10-CM

## 2018-02-28 LAB — CBC
HCT: 34.8 % — ABNORMAL LOW (ref 36.0–46.0)
HEMOGLOBIN: 11.6 g/dL — AB (ref 12.0–15.0)
MCH: 31.8 pg (ref 26.0–34.0)
MCHC: 33.3 g/dL (ref 30.0–36.0)
MCV: 95.3 fL (ref 78.0–100.0)
PLATELETS: 377 10*3/uL (ref 150–400)
RBC: 3.65 MIL/uL — ABNORMAL LOW (ref 3.87–5.11)
RDW: 13.3 % (ref 11.5–15.5)
WBC: 6.1 10*3/uL (ref 4.0–10.5)

## 2018-02-28 LAB — URINALYSIS, ROUTINE W REFLEX MICROSCOPIC
Bilirubin Urine: NEGATIVE
GLUCOSE, UA: NEGATIVE mg/dL
HGB URINE DIPSTICK: NEGATIVE
KETONES UR: NEGATIVE mg/dL
Leukocytes, UA: NEGATIVE
Nitrite: NEGATIVE
PH: 6 (ref 5.0–8.0)
PROTEIN: NEGATIVE mg/dL
Specific Gravity, Urine: 1.017 (ref 1.005–1.030)

## 2018-02-28 LAB — HCG, QUANTITATIVE, PREGNANCY: HCG, BETA CHAIN, QUANT, S: 722 m[IU]/mL — AB (ref <5)

## 2018-02-28 LAB — WET PREP, GENITAL
Sperm: NONE SEEN
Trich, Wet Prep: NONE SEEN
Yeast Wet Prep HPF POC: NONE SEEN

## 2018-02-28 LAB — POCT PREGNANCY, URINE: Preg Test, Ur: POSITIVE — AB

## 2018-02-28 MED ORDER — METRONIDAZOLE 500 MG PO TABS: 500.0000 mg | ORAL_TABLET | Freq: Two times a day (BID) | ORAL | 0 refills | Status: AC

## 2018-02-28 NOTE — MAU Provider Note (Signed)
History     CSN: [id number]  Arrival date and time: 02/28/18 1141   First Provider Initiated Contact with Patient 02/28/18 1208      Chief Complaint  Patient presents with  . Urinary Frequency  . Abdominal Pain  . Back Pain   HPI  [name] is a 32 y.o. year old [id number] female at [redacted]w[redacted]d weeks gestation by LMP who presents to MAU reporting urinary frequency, back and lower abdominal pain x "a couple of weeks". She reports she has a period every 24-26 days, so her period is late. She reports that she was not intentionally TTC, but does have sex with her boyfriend "3-4 times" everyday. She denies VB or abnormal vaginal d/c.  Past Medical History:  Diagnosis Date  . Abnormal Pap smear    f/u was normal  . Depression    doind good now  . Headache(784.0)   . Infection    trich, chlamydia, gonorrhea  . Ovarian cyst   . Urinary tract infection     Past Surgical History:  Procedure Laterality Date  . CESAREAN SECTION    . implanon removal 08/2012  08/2012  . TONSILLECTOMY    . WISDOM TOOTH EXTRACTION      Family History  Problem Relation Age of Onset  . Hypertension Mother   . Asthma Brother   . Cancer Maternal Uncle        lung  . Diabetes Maternal Grandmother   . Cancer Maternal Grandmother        thyroid  . Hypertension Maternal Grandmother   . Other Neg Hx     Social History   Tobacco Use  . Smoking status: Never Smoker  . Smokeless tobacco: Never Used  . Tobacco comment: rare  Substance Use Topics  . Alcohol use: Yes  . Drug use: Yes    Types: Marijuana    Allergies: No Known Allergies  Medications Prior to Admission  Medication Sig Dispense Refill Last Dose  . aspirin-acetaminophen-caffeine (EXCEDRIN MIGRAINE) 250-250-65 MG tablet Take 1-2 tablets by mouth every 6 (six) hours as needed for headache.   Past Week at Unknown time  . sertraline (ZOLOFT) 50 MG tablet Take 1 tablet (50 mg total) by mouth daily. Need office visit before next refill.  30 tablet 0 Past Week at Unknown time  . metroNIDAZOLE (FLAGYL) 500 MG tablet Take 1 tablet (500 mg total) by mouth 2 (two) times daily. (Patient not taking: Reported on 02/28/2018) 14 tablet 0 Completed Course at Unknown time  . ondansetron (ZOFRAN ODT) 4 MG disintegrating tablet Take 1 tablet (4 mg total) by mouth every 8 (eight) hours as needed. (Patient not taking: Reported on 02/28/2018) 10 tablet 0 Not Taking at Unknown time    Review of Systems  Constitutional: Negative.   HENT: Negative.   Eyes: Negative.   Respiratory: Negative.   Cardiovascular: Negative.   Gastrointestinal: Positive for abdominal pain (lower).  Endocrine: Negative.   Genitourinary: Negative.   Musculoskeletal: Positive for back pain (lower).  Skin: Negative.   Allergic/Immunologic: Negative.   Neurological: Negative.   Hematological: Negative.   Psychiatric/Behavioral: Negative.    Physical Exam   Patient Vitals for the past 24 hrs:  BP Temp Pulse Resp Height Weight  02/28/18 1332 129/65 - 75 - - -  02/28/18 1153 - - - - 5\' 2"  (1.575 m) 245 lb 1.3 oz (111.2 kg)  02/28/18 1150 (!) 172/89 98.2 F (36.8 C) 85 16 - -  Physical Exam  Nursing note and vitals reviewed. Constitutional: She is oriented to person, place, and time. She appears well-developed and well-nourished.  HENT:  Head: Normocephalic and atraumatic.  Eyes: Pupils are equal, round, and reactive to light.  Neck: Normal range of motion.  Cardiovascular: Normal rate, regular rhythm and normal heart sounds.  Respiratory: Effort normal and breath sounds normal.  GI: Soft. Bowel sounds are normal.  Genitourinary:  Genitourinary Comments: Uterus: non-tender, SE: cervix is smooth, pink, no lesions, small amt of thick, white vaginal d/c -- WP, GC/CT done, closed/long/firm, no CMT or friability, no adnexal tenderness   Musculoskeletal: Normal range of motion.  Neurological: She is alert and oriented to person, place, and time.  Skin: Skin is  warm and dry.  Psychiatric: She has a normal mood and affect. Her behavior is normal. Judgment and thought content normal.    MAU Course  Procedures  MDM [facility] UPT CBC w/Diff HCG Reviewed documented ABO/RH: A Neg -- Rhophylac not indicated d/t complaints Wet Prep GC/CT -- pending HIV -- pending OB <14 wks [place] w/TV  Results for orders placed or performed during the hospital encounter of 02/28/18 (from the past 24 hour(s))  Urinalysis, Routine w reflex microscopic     Status: None   Collection Time: 02/28/18 11:45 AM  Result Value Ref Range   Color, Urine YELLOW YELLOW   APPearance CLEAR CLEAR   Specific Gravity, Urine 1.017 1.005 - 1.030   pH 6.0 5.0 - 8.0   Glucose, UA NEGATIVE NEGATIVE mg/dL   Hgb urine dipstick NEGATIVE NEGATIVE   Bilirubin Urine NEGATIVE NEGATIVE   Ketones, ur NEGATIVE NEGATIVE mg/dL   Protein, ur NEGATIVE NEGATIVE mg/dL   Nitrite NEGATIVE NEGATIVE   Leukocytes, UA NEGATIVE NEGATIVE  Pregnancy, urine POC     Status: Abnormal   Collection Time: 02/28/18 12:02 PM  Result Value Ref Range   Preg Test, Ur POSITIVE (A) NEGATIVE  Wet prep, genital     Status: Abnormal   Collection Time: 02/28/18 12:10 PM  Result Value Ref Range   Yeast Wet Prep HPF POC NONE SEEN NONE SEEN   Trich, Wet Prep NONE SEEN NONE SEEN   Clue Cells Wet Prep HPF POC PRESENT (A) NONE SEEN   WBC, Wet Prep HPF POC MANY (A) NONE SEEN   Sperm NONE SEEN   CBC     Status: Abnormal   Collection Time: 02/28/18 12:23 PM  Result Value Ref Range   WBC 6.1 4.0 - 10.5 K/uL   RBC 3.65 (L) 3.87 - 5.11 MIL/uL   Hemoglobin 11.6 (L) 12.0 - 15.0 g/dL   HCT 34.8 (L) 36.0 - 46.0 %   MCV 95.3 78.0 - 100.0 fL   MCH 31.8 26.0 - 34.0 pg   MCHC 33.3 30.0 - 36.0 g/dL   RDW 13.3 11.5 - 15.5 %   Platelets 377 150 - 400 K/uL  hCG, quantitative, pregnancy     Status: Abnormal   Collection Time: 02/28/18 12:23 PM  Result Value Ref Range   hCG, Beta Chain, Quant, S 722 (H) <5 mIU/mL    US Ob Less Than 14  Weeks With Ob Transvaginal  Result Date: 02/28/2018 CLINICAL DATA:  Abdominal pain. EXAM: OBSTETRIC <14 WK [place] AND TRANSVAGINAL OB US TECHNIQUE: Both transabdominal and transvaginal ultrasound examinations were performed for complete evaluation of the gestation as well as the maternal uterus, adnexal regions, and pelvic cul-de-sac. Transvaginal technique was performed to assess early pregnancy. COMPARISON:  None FINDINGS: Intrauterine gestational  sac: Normal Yolk sac:  None Embryo:  None Subchorionic hemorrhage:  None visualized. Maternal uterus/adnexae: Subchorionic hemorrhage: None Right ovary: Normal.  Corpus luteal cysts noted. Left ovary: Normal Other :None Free fluid:  Trace IMPRESSION: 1. No intrauterine gestational sac, yolk sac, or fetal pole identified. In the setting of a positive pregnancy test the differential considerations include intrauterine pregnancy too early to be sonographically visualized, missed abortion, or ectopic pregnancy. Followup ultrasound is recommended in 10-14 days for further evaluation. Electronically Signed   By: Kerby Moors M.D.   On: 02/28/2018 13:11     Assessment and Plan  Abdominal pain in pregnancy  - Ok to take Tylenol 1000 mg QID - Information provided on abdominal pain in pregnancy - Return to [place] for repeat HCG on Tuesday 4/2 at 1:30 PM // Advised to arrive at 1:15 PM for registration  Bacterial Vaginosis - Rx for Flagyl 500 mg BID every 7 days - Information provided on BV and Flagyl  Discharge home Patient verbalized an understanding of the plan of care and agrees.     Laury Deep, MSN, CNM 02/28/2018, 12:09 PM

## 2018-02-28 NOTE — MAU Note (Signed)
Patient presents with urinary frequency, back and lower abdominal pain onset couple of weeks.

## 2018-03-01 ENCOUNTER — Encounter: Payer: Medicaid Other | Admitting: Student

## 2018-03-01 LAB — CULTURE, OB URINE: SPECIAL REQUESTS: NORMAL

## 2018-03-01 LAB — GC/CHLAMYDIA PROBE AMP (~~LOC~~) NOT AT ARMC
CHLAMYDIA, DNA PROBE: NEGATIVE
Neisseria Gonorrhea: NEGATIVE

## 2018-03-01 LAB — HIV ANTIBODY (ROUTINE TESTING W REFLEX): HIV SCREEN 4TH GENERATION: NONREACTIVE

## 2018-03-15 ENCOUNTER — Ambulatory Visit (INDEPENDENT_AMBULATORY_CARE_PROVIDER_SITE_OTHER): Payer: Medicaid Other | Admitting: Family Medicine

## 2018-03-15 ENCOUNTER — Other Ambulatory Visit: Payer: Self-pay

## 2018-03-15 ENCOUNTER — Other Ambulatory Visit (HOSPITAL_COMMUNITY)
Admission: RE | Admit: 2018-03-15 | Discharge: 2018-03-15 | Disposition: A | Discharge status: Home / Self Care | Payer: Medicaid Other | Source: Ambulatory Visit | Attending: Family Medicine | Admitting: Family Medicine

## 2018-03-15 ENCOUNTER — Encounter: Payer: Self-pay | Admitting: Family Medicine

## 2018-03-15 ENCOUNTER — Encounter: Payer: Medicaid Other | Admitting: Family Medicine

## 2018-03-15 VITALS — BP 118/80 | HR 109 | Temp 98.3°F | Wt 247.0 lb

## 2018-03-15 DIAGNOSIS — O30041 Twin pregnancy, dichorionic/diamniotic, first trimester: Secondary | ICD-10-CM | POA: Diagnosis present

## 2018-03-15 DIAGNOSIS — Z3401 Encounter for supervision of normal first pregnancy, first trimester: Secondary | ICD-10-CM | POA: Insufficient documentation

## 2018-03-15 DIAGNOSIS — Z3400 Encounter for supervision of normal first pregnancy, unspecified trimester: Secondary | ICD-10-CM

## 2018-03-15 MED ORDER — HYDROCORTISONE 1 % EX LOTN: 1.0000 "application " | TOPICAL_LOTION | Freq: Two times a day (BID) | CUTANEOUS | 0 refills | Status: DC
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
## 2018-03-15 NOTE — Patient Instructions (Addendum)
It was nice meeting you today [name] [name]!  I am sending an anti-itch cream for your bug bites.  This is a very low dose steroid cream that should not harm your baby.  You may need an exterminator to get rid of the bugs, and you should wash all sheets in hot water.  We are getting labs today to make sure everything is going well in your pregnancy.  We will let you know if anything is abnormal.  I would like to see you again in about 4 weeks.  If you have any questions or concerns, please feel free to call the clinic.   Be well,  Dr. Shan Levans   First Trimester of Pregnancy The first trimester of pregnancy is from week 1 until the end of week 13 (months 1 through 3). A week after a sperm fertilizes an egg, the egg will implant on the wall of the uterus. This embryo will begin to develop into a baby. Genes from you and your partner will form the baby. The female genes will determine whether the baby will be a boy or a girl. At 6-8 weeks, the eyes and face will be formed, and the heartbeat can be seen on ultrasound. At the end of 12 weeks, all the baby's organs will be formed. Now that you are pregnant, you will want to do everything you can to have a healthy baby. Two of the most important things are to get good prenatal care and to follow your health care provider's instructions. Prenatal care is all the medical care you receive before the baby's birth. This care will help prevent, find, and treat any problems during the pregnancy and childbirth. Body changes during your first trimester Your body goes through many changes during pregnancy. The changes vary from woman to woman.  You may gain or lose a couple of pounds at first.  You may feel sick to your stomach (nauseous) and you may throw up (vomit). If the vomiting is uncontrollable, call your health care provider.  You may tire easily.  You may develop headaches that can be relieved by medicines. All medicines should be approved by your health  care provider.  You may urinate more often. Painful urination may mean you have a bladder infection.  You may develop heartburn as a result of your pregnancy.  You may develop constipation because certain hormones are causing the muscles that push stool through your intestines to slow down.  You may develop hemorrhoids or swollen veins (varicose veins).  Your breasts may begin to grow larger and become tender. Your nipples may stick out more, and the tissue that surrounds them (areola) may become darker.  Your gums may bleed and may be sensitive to brushing and flossing.  Dark spots or blotches (chloasma, mask of pregnancy) may develop on your face. This will likely fade after the baby is born.  Your menstrual periods will stop.  You may have a loss of appetite.  You may develop cravings for certain kinds of food.  You may have changes in your emotions from day to day, such as being excited to be pregnant or being concerned that something may go wrong with the pregnancy and baby.  You may have more vivid and strange dreams.  You may have changes in your hair. These can include thickening of your hair, rapid growth, and changes in texture. Some women also have hair loss during or after pregnancy, or hair that feels dry or thin. Your hair will  most likely return to normal after your baby is born.  What to expect at prenatal visits During a routine prenatal visit:  You will be weighed to make sure you and the baby are growing normally.  Your blood pressure will be taken.  Your abdomen will be measured to track your baby's growth.  The fetal heartbeat will be listened to between weeks 10 and 14 of your pregnancy.  Test results from any previous visits will be discussed.  Your health care provider may ask you:  How you are feeling.  If you are feeling the baby move.  If you have had any abnormal symptoms, such as leaking fluid, bleeding, severe headaches, or abdominal  cramping.  If you are using any tobacco products, including cigarettes, chewing tobacco, and electronic cigarettes.  If you have any questions.  Other tests that may be performed during your first trimester include:  Blood tests to find your blood type and to check for the presence of any previous infections. The tests will also be used to check for low iron levels (anemia) and protein on red blood cells (Rh antibodies). Depending on your risk factors, or if you previously had diabetes during pregnancy, you may have tests to check for high blood sugar that affects pregnant women (gestational diabetes).  Urine tests to check for infections, diabetes, or protein in the urine.  An ultrasound to confirm the proper growth and development of the baby.  Fetal screens for spinal cord problems (spina bifida) and Down syndrome.  HIV (human immunodeficiency virus) testing. Routine prenatal testing includes screening for HIV, unless you choose not to have this test.  You may need other tests to make sure you and the baby are doing well.  Follow these instructions at home: Medicines  Follow your health care provider's instructions regarding medicine use. Specific medicines may be either safe or unsafe to take during pregnancy.  Take a prenatal vitamin that contains at least 600 micrograms (mcg) of folic acid.  If you develop constipation, try taking a stool softener if your health care provider approves. Eating and drinking  Eat a balanced diet that includes fresh fruits and vegetables, whole grains, good sources of protein such as meat, eggs, or tofu, and low-fat dairy. Your health care provider will help you determine the amount of weight gain that is right for you.  Avoid raw meat and uncooked cheese. These carry germs that can cause birth defects in the baby.  Eating four or five small meals rather than three large meals a day may help relieve nausea and vomiting. If you start to feel  nauseous, eating a few soda crackers can be helpful. Drinking liquids between meals, instead of during meals, also seems to help ease nausea and vomiting.  Limit foods that are high in fat and processed sugars, such as fried and sweet foods.  To prevent constipation: ? Eat foods that are high in fiber, such as fresh fruits and vegetables, whole grains, and beans. ? Drink enough fluid to keep your urine clear or pale yellow. Activity  Exercise only as directed by your health care provider. Most women can continue their usual exercise routine during pregnancy. Try to exercise for 30 minutes at least 5 days a week. Exercising will help you: ? Control your weight. ? Stay in shape. ? Be prepared for labor and delivery.  Experiencing pain or cramping in the lower abdomen or lower back is a good sign that you should stop exercising. Check with your  health care provider before continuing with normal exercises.  Try to avoid standing for long periods of time. Move your legs often if you must stand in one place for a long time.  Avoid heavy lifting.  Wear low-heeled shoes and practice good posture.  You may continue to have sex unless your health care provider tells you not to. Relieving pain and discomfort  Wear a good support bra to relieve breast tenderness.  Take warm sitz baths to soothe any pain or discomfort caused by hemorrhoids. Use hemorrhoid cream if your health care provider approves.  Rest with your legs elevated if you have leg cramps or low back pain.  If you develop varicose veins in your legs, wear support hose. Elevate your feet for 15 minutes, 3-4 times a day. Limit salt in your diet. Prenatal care  Schedule your prenatal visits by the twelfth week of pregnancy. They are usually scheduled monthly at first, then more often in the last 2 months before delivery.  Write down your questions. Take them to your prenatal visits.  Keep all your prenatal visits as told by your  health care provider. This is important. Safety  Wear your seat belt at all times when driving.  Make a list of emergency phone numbers, including numbers for family, friends, the hospital, and police and fire departments. General instructions  Ask your health care provider for a referral to a local prenatal education class. Begin classes no later than the beginning of month 6 of your pregnancy.  Ask for help if you have counseling or nutritional needs during pregnancy. Your health care provider can offer advice or refer you to specialists for help with various needs.  Do not use hot tubs, steam rooms, or saunas.  Do not douche or use tampons or scented sanitary pads.  Do not cross your legs for long periods of time.  Avoid cat litter boxes and soil used by cats. These carry germs that can cause birth defects in the baby and possibly loss of the fetus by miscarriage or stillbirth.  Avoid all smoking, herbs, alcohol, and medicines not prescribed by your health care provider. Chemicals in these products affect the formation and growth of the baby.  Do not use any products that contain nicotine or tobacco, such as cigarettes and e-cigarettes. If you need help quitting, ask your health care provider. You may receive counseling support and other resources to help you quit.  Schedule a dentist appointment. At home, brush your teeth with a soft toothbrush and be gentle when you floss. Contact a health care provider if:  You have dizziness.  You have mild pelvic cramps, pelvic pressure, or nagging pain in the abdominal area.  You have persistent nausea, vomiting, or diarrhea.  You have a bad smelling vaginal discharge.  You have pain when you urinate.  You notice increased swelling in your face, hands, legs, or ankles.  You are exposed to fifth disease or chickenpox.  You are exposed to [place] measles (rubella) and have never had it. Get help right away if:  You have a  fever.  You are leaking fluid from your vagina.  You have spotting or bleeding from your vagina.  You have severe abdominal cramping or pain.  You have rapid weight gain or loss.  You vomit blood or material that looks like coffee grounds.  You develop a severe headache.  You have shortness of breath.  You have any kind of trauma, such as from a fall or a  car accident. Summary  The first trimester of pregnancy is from week 1 until the end of week 13 (months 1 through 3).  Your body goes through many changes during pregnancy. The changes vary from woman to woman.  You will have routine prenatal visits. During those visits, your health care provider will examine you, discuss any test results you may have, and talk with you about how you are feeling. This information is not intended to replace advice given to you by your health care provider. Make sure you discuss any questions you have with your health care provider. Document Released: 11/11/2001 Document Revised: 10/29/2016 Document Reviewed: 10/29/2016 Elsevier Interactive Patient Education  [employer].

## 2018-03-16 LAB — RPR+RH+ABO+RUB AB+AB SCR+CB...
ANTIBODY SCREEN: NEGATIVE
HEMOGLOBIN: 11.7 g/dL (ref 11.1–15.9)
HEP B S AG: NEGATIVE
HIV SCREEN 4TH GENERATION: NONREACTIVE
Hematocrit: 34.7 % (ref 34.0–46.6)
MCH: 32 pg (ref 26.6–33.0)
MCHC: 33.7 g/dL (ref 31.5–35.7)
MCV: 95 fL (ref 79–97)
Platelets: 403 10*3/uL — ABNORMAL HIGH (ref 150–379)
RBC: 3.66 x10E6/uL — AB (ref 3.77–5.28)
RDW: 13.7 % (ref 12.3–15.4)
RH TYPE: NEGATIVE
RPR Ser Ql: NONREACTIVE
RUBELLA: 2.87 {index} (ref >0.99)
Varicella zoster IgG: 1163 index (ref >165)
WBC: 9.5 10*3/uL (ref 3.4–10.8)

## 2018-03-16 LAB — BETA HCG QUANT (REF LAB): hCG Quant: 57380 m[IU]/mL

## 2018-03-17 LAB — CERVICOVAGINAL ANCILLARY ONLY
CHLAMYDIA, DNA PROBE: NEGATIVE
Neisseria Gonorrhea: NEGATIVE

## 2018-03-17 LAB — CYTOLOGY - PAP
DIAGNOSIS: NEGATIVE
HPV (WINDOPATH): NOT DETECTED

## 2018-03-18 ENCOUNTER — Telehealth: Payer: Self-pay

## 2018-03-18 NOTE — Telephone Encounter (Signed)
Pt called nurse line, currently [redacted] weeks pregnant. States she saw some spotting when she went to the bathroom. Denies heavy bleeding or cramping. Advised sometimes spotting in early pregnancy is not uncommon. To watch bleeding and if it starts again, becomes heavy or she develops cramping or pain to go to MAU for evaluation. Wallace Cullens, RN

## 2018-03-19 ENCOUNTER — Inpatient Hospital Stay (HOSPITAL_COMMUNITY)
Admission: AD | Admit: 2018-03-19 | Discharge: 2018-03-19 | Disposition: A | Discharge status: Home / Self Care | Payer: Medicaid Other | Source: Ambulatory Visit | Attending: Family Medicine | Admitting: Family Medicine

## 2018-03-19 ENCOUNTER — Inpatient Hospital Stay (HOSPITAL_COMMUNITY): Payer: Medicaid Other

## 2018-03-19 ENCOUNTER — Encounter (HOSPITAL_COMMUNITY): Payer: Self-pay

## 2018-03-19 DIAGNOSIS — Z3A01 Less than 8 weeks gestation of pregnancy: Secondary | ICD-10-CM

## 2018-03-19 DIAGNOSIS — O209 Hemorrhage in early pregnancy, unspecified: Secondary | ICD-10-CM | POA: Diagnosis not present

## 2018-03-19 DIAGNOSIS — O30049 Twin pregnancy, dichorionic/diamniotic, unspecified trimester: Secondary | ICD-10-CM | POA: Diagnosis present

## 2018-03-19 DIAGNOSIS — O3680X0 Pregnancy with inconclusive fetal viability, not applicable or unspecified: Secondary | ICD-10-CM

## 2018-03-19 DIAGNOSIS — O418X12 Other specified disorders of amniotic fluid and membranes, first trimester, fetus 2: Secondary | ICD-10-CM

## 2018-03-19 DIAGNOSIS — O30041 Twin pregnancy, dichorionic/diamniotic, first trimester: Secondary | ICD-10-CM | POA: Insufficient documentation

## 2018-03-19 DIAGNOSIS — O210 Mild hyperemesis gravidarum: Secondary | ICD-10-CM | POA: Diagnosis not present

## 2018-03-19 DIAGNOSIS — Z7982 Long term (current) use of aspirin: Secondary | ICD-10-CM | POA: Insufficient documentation

## 2018-03-19 DIAGNOSIS — O468X1 Other antepartum hemorrhage, first trimester: Secondary | ICD-10-CM | POA: Diagnosis not present

## 2018-03-19 DIAGNOSIS — B9689 Other specified bacterial agents as the cause of diseases classified elsewhere: Secondary | ICD-10-CM

## 2018-03-19 DIAGNOSIS — N760 Acute vaginitis: Secondary | ICD-10-CM

## 2018-03-19 LAB — URINALYSIS, ROUTINE W REFLEX MICROSCOPIC
BACTERIA UA: NONE SEEN
Bilirubin Urine: NEGATIVE
Glucose, UA: NEGATIVE mg/dL
Ketones, ur: NEGATIVE mg/dL
NITRITE: NEGATIVE
PH: 6 (ref 5.0–8.0)
Protein, ur: NEGATIVE mg/dL
SPECIFIC GRAVITY, URINE: 1.024 (ref 1.005–1.030)

## 2018-03-19 LAB — WET PREP, GENITAL
CLUE CELLS WET PREP: NONE SEEN
Sperm: NONE SEEN
TRICH WET PREP: NONE SEEN
WBC, Wet Prep HPF POC: NONE SEEN
Yeast Wet Prep HPF POC: NONE SEEN

## 2018-03-19 MED ORDER — PROMETHAZINE HCL 25 MG PO TABS: 25.0000 mg | ORAL_TABLET | Freq: Four times a day (QID) | ORAL | 3 refills | Status: DC | PRN

## 2018-03-19 MED ORDER — PROMETHAZINE HCL 25 MG PO TABS
25.0000 mg | ORAL_TABLET | Freq: Once | ORAL | Status: AC
  Administered 2018-03-19: 25 mg via ORAL
  Filled 2018-03-19: qty 1

## 2018-03-19 MED ORDER — RHO D IMMUNE GLOBULIN 1500 UNIT/2ML IJ SOSY
300.0000 ug | PREFILLED_SYRINGE | Freq: Once | INTRAMUSCULAR | Status: AC
  Administered 2018-03-19: 300 ug via INTRAMUSCULAR
  Filled 2018-03-19: qty 2

## 2018-03-19 NOTE — MAU Note (Signed)
Pt is a G6P5 at 6.4 weeks c/o bleeding yesterday one time, and today again, only once,  Pt reports bleeding to be only when gong to the bathroom.  Pt reports slight cramping.  No other concerns.

## 2018-03-19 NOTE — Discharge Instructions (Signed)

## 2018-03-19 NOTE — MAU Note (Signed)
Pt was having some bleeding yesteday which was heavy on and off and continued today. Some abdominal pain. 5/10

## 2018-03-19 NOTE — MAU Provider Note (Signed)
History     CSN: [id number]  Arrival date and time: 03/19/18 1350   First Provider Initiated Contact with Patient 03/19/18 1518      Chief Complaint  Patient presents with  . Vaginal Bleeding   HPI  [name] is a 32 y.o. year old [id number] female at [redacted]w[redacted]d weeks gestation who presents to MAU reporting "peeing out blood" yesterday with pink with wiping, wiping with BRB on tissue today and more abdominal cramping. She denies doing anything besides "lying down when it happened". She reports last SI was yesterday.  Past Medical History:  Diagnosis Date  . Abnormal Pap smear    f/u was normal  . Depression    doind good now  . Headache(784.0)   . Infection    trich, chlamydia, gonorrhea  . Ovarian cyst   . Urinary tract infection     Past Surgical History:  Procedure Laterality Date  . CESAREAN SECTION    . implanon removal 08/2012  08/2012  . TONSILLECTOMY    . WISDOM TOOTH EXTRACTION      Family History  Problem Relation Age of Onset  . Hypertension Mother   . Asthma Brother   . Cancer Maternal Uncle        lung  . Diabetes Maternal Grandmother   . Cancer Maternal Grandmother        thyroid  . Hypertension Maternal Grandmother   . Other Neg Hx     Social History   Tobacco Use  . Smoking status: Never Smoker  . Smokeless tobacco: Never Used  . Tobacco comment: rare  Substance Use Topics  . Alcohol use: Yes  . Drug use: Yes    Types: Marijuana    Allergies: No Known Allergies  Medications Prior to Admission  Medication Sig Dispense Refill Last Dose  . aspirin-acetaminophen-caffeine (EXCEDRIN MIGRAINE) 250-250-65 MG tablet Take 1 tablet by mouth every 6 (six) hours as needed for headache.   Past Week at Unknown time  . hydrocortisone 1 % lotion Apply 1 application topically 2 (two) times daily. (Patient not taking: Reported on 03/19/2018) 118 mL 0 Not Taking at Unknown time  . sertraline (ZOLOFT) 50 MG tablet Take 1 tablet (50 mg total) by mouth  daily. Need office visit before next refill. (Patient not taking: Reported on 03/19/2018) 30 tablet 0 Not Taking at Unknown time    Review of Systems  Constitutional: Negative.   HENT: Negative.   Eyes: Negative.   Respiratory: Negative.   Cardiovascular: Negative.   Gastrointestinal: Positive for nausea and vomiting.  Endocrine: Negative.   Genitourinary: Positive for pelvic pain (cramping) and vaginal bleeding.  Musculoskeletal: Negative.   Skin: Negative.   Allergic/Immunologic: Negative.   Neurological: Negative.   Hematological: Negative.   Psychiatric/Behavioral: Negative.    Physical Exam   Blood pressure 130/81, pulse 83, temperature 98.3 F (36.8 C), temperature source Oral, resp. rate 16, weight 252 lb (114.3 kg), last menstrual period 02/01/2018, SpO2 97 %.  Physical Exam  Nursing note and vitals reviewed. Constitutional: She is oriented to person, place, and time. She appears well-developed and well-nourished.  HENT:  Head: Normocephalic and atraumatic.  Eyes: Pupils are equal, round, and reactive to light.  Neck: Normal range of motion.  Cardiovascular: Normal rate, regular rhythm and normal heart sounds.  Respiratory: Effort normal and breath sounds normal.  GI: Soft. Bowel sounds are normal.  Genitourinary:  Genitourinary Comments: Uterus: non-tender, SE: cervix is smooth, pink, no lesions, scant amt of pink,  white vaginal d/c -- WP, GC/CT done, closed/long/firm, no CMT or friability, no adnexal tenderness   Musculoskeletal: Normal range of motion.  Neurological: She is alert and oriented to person, place, and time.  Skin: Skin is warm and dry.  Psychiatric: She has a normal mood and affect. Her behavior is normal. Judgment and thought content normal.    MAU Course  Procedures  MDM CCUA UPT CBC  Rhophylac w/u Rhophylac 300 mcg injection HCG Wet Prep GC/CT -- pending OB < 14 wks [place] with TV  Results for orders placed or performed during the hospital  encounter of 03/19/18 (from the past 24 hour(s))  Urinalysis, Routine w reflex microscopic     Status: Abnormal   Collection Time: 03/19/18  2:31 PM  Result Value Ref Range   Color, Urine YELLOW YELLOW   APPearance CLEAR CLEAR   Specific Gravity, Urine 1.024 1.005 - 1.030   pH 6.0 5.0 - 8.0   Glucose, UA NEGATIVE NEGATIVE mg/dL   Hgb urine dipstick SMALL (A) NEGATIVE   Bilirubin Urine NEGATIVE NEGATIVE   Ketones, ur NEGATIVE NEGATIVE mg/dL   Protein, ur NEGATIVE NEGATIVE mg/dL   Nitrite NEGATIVE NEGATIVE   Leukocytes, UA TRACE (A) NEGATIVE   RBC / HPF 0-5 0 - 5 RBC/hpf   WBC, UA 6-30 0 - 5 WBC/hpf   Bacteria, UA NONE SEEN NONE SEEN   Squamous Epithelial / LPF 0-5 (A) NONE SEEN   Mucus PRESENT   Rh IG workup (includes ABO/Rh)     Status: None (Preliminary result)   Collection Time: 03/19/18  3:02 PM  Result Value Ref Range   Gestational Age(Wks) 6.4    ABO/RH(D) A NEG    Antibody Screen NEG    Unit Number [id number]    Blood Component Type RHIG    Unit division 00    Status of Unit ISSUED    Transfusion Status      OK TO TRANSFUSE Performed at [facility], [address], [place], [place] [address]   Wet prep, genital     Status: None   Collection Time: 03/19/18  3:30 PM  Result Value Ref Range   Yeast Wet Prep HPF POC NONE SEEN NONE SEEN   Trich, Wet Prep NONE SEEN NONE SEEN   Clue Cells Wet Prep HPF POC NONE SEEN NONE SEEN   WBC, Wet Prep HPF POC NONE SEEN NONE SEEN   Sperm NONE SEEN     U/S OB Transvaginal  Result Date: 03/19/2018 CLINICAL DATA:  Bleeding in early pregnancy EXAM: US OB TRANSVAGINAL COMPARISON:  None for this gestation FINDINGS: Number of IUPs:  2 Chorionicity/Amnionicity:  Dichorionic-diamniotic (thick membrane) TWIN 1/A Yolk sac:  Present Embryo:  Present Cardiac Activity: Present Heart Rate: 130 bpm CRL:  8.7 mm   6 w 5 d                  [place] EDC: 11/07/2018 TWIN 2/B Yolk sac:  Present Embryo:  Present Cardiac Activity: Present Heart Rate: 146  bpm CRL:  7.5 mm   6 w 4 d                  [place] EDC: 11/08/2018 Subchorionic hemorrhage:  Small subchorionic hemorrhage visualize Maternal uterus/adnexae: LEFT ovary not visualized. RIGHT ovary measures 3.3 x 1.9 x 2.7 cm and contains a small corpus luteal cyst. No free pelvic fluid or adnexal masses. IMPRESSION: Live twin intrauterine pregnancy as above. Small subchronic hemorrhage. Electronically Signed   By:  Lavonia Dana M.D.   On: 03/19/2018 17:23    Assessment and Plan  Subchorionic hematoma in first trimester, fetus 2 of multiple gestation - Information provided on [facility] & pelvic rest - Advised to abstain from SI until all spotting stops  Dichorionic diamniotic twin pregnancy in first trimester  - Advised to keep OB appt scheduled or 5/13 - Information provided on multiple pregnancy   Morning sickness  - Rx for Phenergan 25 mg po sent - Discharge patient - Patient verbalized an understanding of the plan of care and agrees.   Laury Deep, MSN, CNM 03/19/2018, 3:34 PM

## 2018-03-20 LAB — RH IG WORKUP (INCLUDES ABO/RH)
ABO/RH(D): A NEG
Antibody Screen: NEGATIVE
Gestational Age(Wks): 6.4
Unit division: 0

## 2018-03-22 LAB — GC/CHLAMYDIA PROBE AMP (~~LOC~~) NOT AT ARMC
CHLAMYDIA, DNA PROBE: NEGATIVE
Neisseria Gonorrhea: NEGATIVE

## 2018-04-12 ENCOUNTER — Encounter: Payer: Self-pay | Admitting: Student

## 2018-04-12 ENCOUNTER — Ambulatory Visit (INDEPENDENT_AMBULATORY_CARE_PROVIDER_SITE_OTHER): Payer: Medicaid Other | Admitting: Student

## 2018-04-12 ENCOUNTER — Other Ambulatory Visit: Payer: Self-pay

## 2018-04-12 VITALS — BP 138/72 | HR 94 | Temp 98.3°F | Wt 252.4 lb

## 2018-04-12 DIAGNOSIS — O0991 Supervision of high risk pregnancy, unspecified, first trimester: Secondary | ICD-10-CM | POA: Diagnosis present

## 2018-04-12 DIAGNOSIS — O30041 Twin pregnancy, dichorionic/diamniotic, first trimester: Secondary | ICD-10-CM | POA: Diagnosis not present

## 2018-04-12 LAB — POCT 1 HR PRENATAL GLUCOSE: Glucose 1 Hr Prenatal, POC: 140 mg/dL

## 2018-04-12 MED ORDER — PRENATAL PLUS/IRON 27-1 MG PO TABS: 1.0000 | ORAL_TABLET | Freq: Every day | ORAL | 6 refills | Status: DC
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
## 2018-04-12 NOTE — Patient Instructions (Addendum)
First Trimester of Pregnancy The first trimester of pregnancy is from week 1 until the end of week 13 (months 1 through 3). During this time, your baby will begin to develop inside you. At 6-8 weeks, the eyes and face are formed, and the heartbeat can be seen on ultrasound. At the end of 12 weeks, all the baby's organs are formed. Prenatal care is all the medical care you receive before the birth of your baby. Make sure you get good prenatal care and follow all of your doctor's instructions. Follow these instructions at home: Medicines  Take over-the-counter and prescription medicines only as told by your doctor. Some medicines are safe and some medicines are not safe during pregnancy.  Take a prenatal vitamin that contains at least 600 micrograms (mcg) of folic acid.  If you have trouble pooping (constipation), take medicine that will make your stool soft (stool softener) if your doctor approves. Eating and drinking  Eat regular, healthy meals.  Your doctor will tell you the amount of weight gain that is right for you.  Avoid raw meat and uncooked cheese.  If you feel sick to your stomach (nauseous) or throw up (vomit): ? Eat 4 or 5 small meals a day instead of 3 large meals. ? Try eating a few soda crackers. ? Drink liquids between meals instead of during meals.  To prevent constipation: ? Eat foods that are high in fiber, like fresh fruits and vegetables, whole grains, and beans. ? Drink enough fluids to keep your pee (urine) clear or pale yellow. Activity  Exercise only as told by your doctor. Stop exercising if you have cramps or pain in your lower belly (abdomen) or low back.  Do not exercise if it is too hot, too humid, or if you are in a place of great height (high altitude).  Try to avoid standing for long periods of time. Move your legs often if you must stand in one place for a long time.  Avoid heavy lifting.  Wear low-heeled shoes. Sit and stand up straight.  You  can have sex unless your doctor tells you not to. Relieving pain and discomfort  Wear a good support bra if your breasts are sore.  Take warm water baths (sitz baths) to soothe pain or discomfort caused by hemorrhoids. Use hemorrhoid cream if your doctor says it is okay.  Rest with your legs raised if you have leg cramps or low back pain.  If you have puffy, bulging veins (varicose veins) in your legs: ? Wear support hose or compression stockings as told by your doctor. ? Raise (elevate) your feet for 15 minutes, 3-4 times a day. ? Limit salt in your food. Prenatal care  Schedule your prenatal visits by the twelfth week of pregnancy.  Write down your questions. Take them to your prenatal visits.  Keep all your prenatal visits as told by your doctor. This is important. Safety  Wear your seat belt at all times when driving.  Make a list of emergency phone numbers. The list should include numbers for family, friends, the hospital, and police and fire departments. General instructions  Ask your doctor for a referral to a local prenatal class. Begin classes no later than at the start of month 6 of your pregnancy.  Ask for help if you need counseling or if you need help with nutrition. Your doctor can give you advice or tell you where to go for help.  Do not use hot tubs, steam rooms, or   saunas.  Do not douche or use tampons or scented sanitary pads.  Do not cross your legs for long periods of time.  Avoid all herbs and alcohol. Avoid drugs that are not approved by your doctor.  Do not use any tobacco products, including cigarettes, chewing tobacco, and electronic cigarettes. If you need help quitting, ask your doctor. You may get counseling or other support to help you quit.  Avoid cat litter boxes and soil used by cats. These carry germs that can cause birth defects in the baby and can cause a loss of your baby (miscarriage) or stillbirth.  Visit your dentist. At home, brush  your teeth with a soft toothbrush. Be gentle when you floss. Contact a doctor if:  You are dizzy.  You have mild cramps or pressure in your lower belly.  You have a nagging pain in your belly area.  You continue to feel sick to your stomach, you throw up, or you have watery poop (diarrhea).  You have a bad smelling fluid coming from your vagina.  You have pain when you pee (urinate).  You have increased puffiness (swelling) in your face, hands, legs, or ankles. Get help right away if:  You have a fever.  You are leaking fluid from your vagina.  You have spotting or bleeding from your vagina.  You have very bad belly cramping or pain.  You gain or lose weight rapidly.  You throw up blood. It may look like coffee grounds.  You are around people who have German measles, fifth disease, or chickenpox.  You have a very bad headache.  You have shortness of breath.  You have any kind of trauma, such as from a fall or a car accident. Summary  The first trimester of pregnancy is from week 1 until the end of week 13 (months 1 through 3).  To take care of yourself and your unborn baby, you will need to eat healthy meals, take medicines only if your doctor tells you to do so, and do activities that are safe for you and your baby.  Keep all follow-up visits as told by your doctor. This is important as your doctor will have to ensure that your baby is healthy and growing well. This information is not intended to replace advice given to you by your health care provider. Make sure you discuss any questions you have with your health care provider. Document Released: 05/05/2008 Document Revised: 11/25/2016 Document Reviewed: 11/25/2016 Elsevier Interactive Patient Education  2017 [employer]  

## 2018-04-13 ENCOUNTER — Telehealth: Payer: Self-pay | Admitting: Student

## 2018-04-13 ENCOUNTER — Encounter: Payer: Self-pay | Admitting: Student

## 2018-04-14 ENCOUNTER — Telehealth: Payer: Self-pay | Admitting: Student

## 2018-04-14 NOTE — Telephone Encounter (Signed)
Called patient to remind her about her upcoming OB appointment on 04/19/2018 at 2:55 pm at [facility]. Patient voiced understanding and appreciated the call.

## 2018-04-14 NOTE — Telephone Encounter (Signed)
Opened by mistake.

## 2018-04-19 ENCOUNTER — Ambulatory Visit (INDEPENDENT_AMBULATORY_CARE_PROVIDER_SITE_OTHER): Payer: Medicaid Other | Admitting: Obstetrics and Gynecology

## 2018-04-19 ENCOUNTER — Encounter: Payer: Self-pay | Admitting: Obstetrics and Gynecology

## 2018-04-19 VITALS — BP 122/85 | HR 86 | Wt 251.3 lb

## 2018-04-19 DIAGNOSIS — O0991 Supervision of high risk pregnancy, unspecified, first trimester: Secondary | ICD-10-CM

## 2018-04-19 DIAGNOSIS — Z98891 History of uterine scar from previous surgery: Secondary | ICD-10-CM

## 2018-04-19 DIAGNOSIS — O418X11 Other specified disorders of amniotic fluid and membranes, first trimester, fetus 1: Secondary | ICD-10-CM

## 2018-04-19 DIAGNOSIS — O468X1 Other antepartum hemorrhage, first trimester: Secondary | ICD-10-CM

## 2018-04-19 DIAGNOSIS — A609 Anogenital herpesviral infection, unspecified: Secondary | ICD-10-CM

## 2018-04-19 DIAGNOSIS — O34219 Maternal care for unspecified type scar from previous cesarean delivery: Secondary | ICD-10-CM

## 2018-04-19 DIAGNOSIS — O30041 Twin pregnancy, dichorionic/diamniotic, first trimester: Secondary | ICD-10-CM

## 2018-04-19 MED ORDER — ASPIRIN EC 81 MG PO TBEC: 81.0000 mg | DELAYED_RELEASE_TABLET | Freq: Every day | ORAL | 2 refills | Status: DC

## 2018-04-19 NOTE — Progress Notes (Signed)
FHR's obtained with bedside US. Fetal movement observed from both babies.

## 2018-04-19 NOTE — Patient Instructions (Signed)
Multiple Pregnancy Having a multiple pregnancy means that a woman is carrying more than one baby at a time. She may be pregnant with twins, triplets, or more. The majority of multiple pregnancies are twins. Naturally conceiving triplets or more (higher-order multiples) is rare. Multiple pregnancies are riskier than single pregnancies. A woman with a multiple pregnancy is more likely to have certain problems during her pregnancy. Therefore, she will need to have more frequent appointments for prenatal care. How does a multiple pregnancy happen? A multiple pregnancy happens when:  The woman's body releases more than one egg at a time, and then each egg gets fertilized by a different sperm. ? This is the most common type of multiple pregnancy. ? Twins or other multiples produced this way are fraternal. They are no more alike than non-multiple siblings are.  One sperm fertilizes one egg, which then divides into more than one embryo. ? Twins or other multiples produced this way are identical. Identical multiples are always the same gender, and they look very much alike.  Who is most likely to have a multiple pregnancy? A multiple pregnancy is more likely to develop in women who:  Have had fertility treatment, especially if the treatment included fertility drugs.  Are older than 30 years of age.  Have already had four or more children.  Have a family history of multiple pregnancy.  How is a multiple pregnancy diagnosed? A multiple pregnancy may be diagnosed based on:  Symptoms such as: ? Rapid weight gain in the first 3 months of pregnancy (first trimester). ? More severe nausea and breast tenderness than what is typical of a single pregnancy. ? The uterus measuring larger than what is normal for the stage of the pregnancy.  Blood tests that detect a higher-than-normal level of human chorionic gonadotropin (hCG). This is a hormone that your body produces in early pregnancy.  Ultrasound  exam. This is used to confirm that you are carrying multiples.  What risks are associated with multiple pregnancy? A multiple pregnancy puts you at a higher risk for certain problems during or after your pregnancy, including:  Having your babies delivered before you have reached a full-term pregnancy (preterm birth). A full-term pregnancy lasts for at least 37 weeks. Babies born before 53 weeks may have a higher risk of a variety of health problems, such as breathing problems, feeding difficulties, cerebral palsy, and learning disabilities.  Diabetes.  Preeclampsia. This is a serious condition that causes high blood pressure along with other symptoms, such as swelling and headaches, during pregnancy.  Excessive blood loss after childbirth (postpartum hemorrhage).  Postpartum depression.  Low birth weight of the babies.  How will having a multiple pregnancy affect my care? Your health care provider will want to monitor you more closely during your pregnancy to make sure that your babies are growing normally and that you are healthy. Follow these instructions at home: Because your pregnancy is considered to be high risk, you will need to work closely with your health care team. You may also need to make some lifestyle changes. These may include the following: Eating and drinking  Increase your nutrition. ? Follow your health care provider's recommendations for weight gain. You may need to gain a little extra weight when you are pregnant with multiples. ? Eat healthy snacks often throughout the day. This can add calories and reduce nausea.  Drink enough fluid to keep your urine clear or pale yellow.  Take prenatal vitamins. Activity By 20-24 weeks, you may  need to limit your activities.  Avoid activities and work that take a lot of effort (are strenuous).  Ask your health care provider when you should stop having sexual intercourse.  Rest often.  General instructions  Do not use  any products that contain nicotine or tobacco, such as cigarettes and e-cigarettes. If you need help quitting, ask your health care provider.  Do not drink alcohol or use illegal drugs.  Take over-the-counter and prescription medicines only as told by your health care provider.  Arrange for extra help around the house.  Keep all follow-up visits and all prenatal visits as told by your health care provider. This is important. Contact a health care provider if:  You have dizziness.  You have persistent nausea, vomiting, or diarrhea.  You are having trouble gaining weight.  You have feelings of depression or other emotions that are interfering with your normal activities. Get help right away if:  You have a fever.  You have pain with urination.  You have fluid leaking from your vagina.  You have a bad-smelling vaginal discharge.  You notice increased swelling in your face, hands, legs, or ankles.  You have spotting or bleeding from your vagina.  You have pelvic cramps, pelvic pressure, or nagging pain in your abdomen or lower back.  You are having regular contractions.  You develop a severe headache, with or without visual changes.  You have shortness of breath or chest pain.  You notice less fetal movement, or no fetal movement. This information is not intended to replace advice given to you by your health care provider. Make sure you discuss any questions you have with your health care provider. Document Released: 08/26/2008 Document Revised: 07/18/2016 Document Reviewed: 07/18/2016 Elsevier Interactive Patient Education  2018 [employer] The first trimester of pregnancy is from week 1 until the end of week 13 (months 1 through 3). A week after a sperm fertilizes an egg, the egg will implant on the wall of the uterus. This embryo will begin to develop into a baby. Genes from you and your partner will form the baby. The female genes will  determine whether the baby will be a boy or a girl. At 6-8 weeks, the eyes and face will be formed, and the heartbeat can be seen on ultrasound. At the end of 12 weeks, all the baby's organs will be formed. Now that you are pregnant, you will want to do everything you can to have a healthy baby. Two of the most important things are to get good prenatal care and to follow your health care provider's instructions. Prenatal care is all the medical care you receive before the baby's birth. This care will help prevent, find, and treat any problems during the pregnancy and childbirth. Body changes during your first trimester Your body goes through many changes during pregnancy. The changes vary from woman to woman.  You may gain or lose a couple of pounds at first.  You may feel sick to your stomach (nauseous) and you may throw up (vomit). If the vomiting is uncontrollable, call your health care provider.  You may tire easily.  You may develop headaches that can be relieved by medicines. All medicines should be approved by your health care provider.  You may urinate more often. Painful urination may mean you have a bladder infection.  You may develop heartburn as a result of your pregnancy.  You may develop constipation because certain hormones are causing the muscles  that push stool through your intestines to slow down.  You may develop hemorrhoids or swollen veins (varicose veins).  Your breasts may begin to grow larger and become tender. Your nipples may stick out more, and the tissue that surrounds them (areola) may become darker.  Your gums may bleed and may be sensitive to brushing and flossing.  Dark spots or blotches (chloasma, mask of pregnancy) may develop on your face. This will likely fade after the baby is born.  Your menstrual periods will stop.  You may have a loss of appetite.  You may develop cravings for certain kinds of food.  You may have changes in your emotions from  day to day, such as being excited to be pregnant or being concerned that something may go wrong with the pregnancy and baby.  You may have more vivid and strange dreams.  You may have changes in your hair. These can include thickening of your hair, rapid growth, and changes in texture. Some women also have hair loss during or after pregnancy, or hair that feels dry or thin. Your hair will most likely return to normal after your baby is born.  What to expect at prenatal visits During a routine prenatal visit:  You will be weighed to make sure you and the baby are growing normally.  Your blood pressure will be taken.  Your abdomen will be measured to track your baby's growth.  The fetal heartbeat will be listened to between weeks 10 and 14 of your pregnancy.  Test results from any previous visits will be discussed.  Your health care provider may ask you:  How you are feeling.  If you are feeling the baby move.  If you have had any abnormal symptoms, such as leaking fluid, bleeding, severe headaches, or abdominal cramping.  If you are using any tobacco products, including cigarettes, chewing tobacco, and electronic cigarettes.  If you have any questions.  Other tests that may be performed during your first trimester include:  Blood tests to find your blood type and to check for the presence of any previous infections. The tests will also be used to check for low iron levels (anemia) and protein on red blood cells (Rh antibodies). Depending on your risk factors, or if you previously had diabetes during pregnancy, you may have tests to check for high blood sugar that affects pregnant women (gestational diabetes).  Urine tests to check for infections, diabetes, or protein in the urine.  An ultrasound to confirm the proper growth and development of the baby.  Fetal screens for spinal cord problems (spina bifida) and Down syndrome.  HIV (human immunodeficiency virus) testing. Routine  prenatal testing includes screening for HIV, unless you choose not to have this test.  You may need other tests to make sure you and the baby are doing well.  Follow these instructions at home: Medicines  Follow your health care provider's instructions regarding medicine use. Specific medicines may be either safe or unsafe to take during pregnancy.  Take a prenatal vitamin that contains at least 600 micrograms (mcg) of folic acid.  If you develop constipation, try taking a stool softener if your health care provider approves. Eating and drinking  Eat a balanced diet that includes fresh fruits and vegetables, whole grains, good sources of protein such as meat, eggs, or tofu, and low-fat dairy. Your health care provider will help you determine the amount of weight gain that is right for you.  Avoid raw meat and uncooked cheese.  These carry germs that can cause birth defects in the baby.  Eating four or five small meals rather than three large meals a day may help relieve nausea and vomiting. If you start to feel nauseous, eating a few soda crackers can be helpful. Drinking liquids between meals, instead of during meals, also seems to help ease nausea and vomiting.  Limit foods that are high in fat and processed sugars, such as fried and sweet foods.  To prevent constipation: ? Eat foods that are high in fiber, such as fresh fruits and vegetables, whole grains, and beans. ? Drink enough fluid to keep your urine clear or pale yellow. Activity  Exercise only as directed by your health care provider. Most women can continue their usual exercise routine during pregnancy. Try to exercise for 30 minutes at least 5 days a week. Exercising will help you: ? Control your weight. ? Stay in shape. ? Be prepared for labor and delivery.  Experiencing pain or cramping in the lower abdomen or lower back is a good sign that you should stop exercising. Check with your health care provider before  continuing with normal exercises.  Try to avoid standing for long periods of time. Move your legs often if you must stand in one place for a long time.  Avoid heavy lifting.  Wear low-heeled shoes and practice good posture.  You may continue to have sex unless your health care provider tells you not to. Relieving pain and discomfort  Wear a good support bra to relieve breast tenderness.  Take warm sitz baths to soothe any pain or discomfort caused by hemorrhoids. Use hemorrhoid cream if your health care provider approves.  Rest with your legs elevated if you have leg cramps or low back pain.  If you develop varicose veins in your legs, wear support hose. Elevate your feet for 15 minutes, 3-4 times a day. Limit salt in your diet. Prenatal care  Schedule your prenatal visits by the twelfth week of pregnancy. They are usually scheduled monthly at first, then more often in the last 2 months before delivery.  Write down your questions. Take them to your prenatal visits.  Keep all your prenatal visits as told by your health care provider. This is important. Safety  Wear your seat belt at all times when driving.  Make a list of emergency phone numbers, including numbers for family, friends, the hospital, and police and fire departments. General instructions  Ask your health care provider for a referral to a local prenatal education class. Begin classes no later than the beginning of month 6 of your pregnancy.  Ask for help if you have counseling or nutritional needs during pregnancy. Your health care provider can offer advice or refer you to specialists for help with various needs.  Do not use hot tubs, steam rooms, or saunas.  Do not douche or use tampons or scented sanitary pads.  Do not cross your legs for long periods of time.  Avoid cat litter boxes and soil used by cats. These carry germs that can cause birth defects in the baby and possibly loss of the fetus by miscarriage  or stillbirth.  Avoid all smoking, herbs, alcohol, and medicines not prescribed by your health care provider. Chemicals in these products affect the formation and growth of the baby.  Do not use any products that contain nicotine or tobacco, such as cigarettes and e-cigarettes. If you need help quitting, ask your health care provider. You may receive counseling support and  other resources to help you quit.  Schedule a dentist appointment. At home, brush your teeth with a soft toothbrush and be gentle when you floss. Contact a health care provider if:  You have dizziness.  You have mild pelvic cramps, pelvic pressure, or nagging pain in the abdominal area.  You have persistent nausea, vomiting, or diarrhea.  You have a bad smelling vaginal discharge.  You have pain when you urinate.  You notice increased swelling in your face, hands, legs, or ankles.  You are exposed to fifth disease or chickenpox.  You are exposed to [place] measles (rubella) and have never had it. Get help right away if:  You have a fever.  You are leaking fluid from your vagina.  You have spotting or bleeding from your vagina.  You have severe abdominal cramping or pain.  You have rapid weight gain or loss.  You vomit blood or material that looks like coffee grounds.  You develop a severe headache.  You have shortness of breath.  You have any kind of trauma, such as from a fall or a car accident. Summary  The first trimester of pregnancy is from week 1 until the end of week 13 (months 1 through 3).  Your body goes through many changes during pregnancy. The changes vary from woman to woman.  You will have routine prenatal visits. During those visits, your health care provider will examine you, discuss any test results you may have, and talk with you about how you are feeling. This information is not intended to replace advice given to you by your health care provider. Make sure you discuss any  questions you have with your health care provider. Document Released: 11/11/2001 Document Revised: 10/29/2016 Document Reviewed: 10/29/2016 Elsevier Interactive Patient Education  [employer].
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
## 2018-04-19 NOTE — Addendum Note (Signed)
Addended by: Bethanne Ginger on: 04/19/2018 05:11 PM   Modules accepted: Orders

## 2018-04-20 ENCOUNTER — Encounter (HOSPITAL_COMMUNITY): Payer: Self-pay | Admitting: *Deleted

## 2018-04-20 ENCOUNTER — Other Ambulatory Visit: Payer: Self-pay

## 2018-04-20 ENCOUNTER — Inpatient Hospital Stay (HOSPITAL_COMMUNITY): Payer: Medicaid Other

## 2018-04-20 ENCOUNTER — Inpatient Hospital Stay (HOSPITAL_COMMUNITY)
Admission: AD | Admit: 2018-04-20 | Discharge: 2018-04-21 | Disposition: A | Discharge status: Home / Self Care | Payer: Medicaid Other | Source: Ambulatory Visit | Attending: Obstetrics and Gynecology | Admitting: Obstetrics and Gynecology

## 2018-04-20 DIAGNOSIS — G43809 Other migraine, not intractable, without status migrainosus: Secondary | ICD-10-CM

## 2018-04-20 DIAGNOSIS — O208 Other hemorrhage in early pregnancy: Secondary | ICD-10-CM | POA: Insufficient documentation

## 2018-04-20 DIAGNOSIS — O418X10 Other specified disorders of amniotic fluid and membranes, first trimester, not applicable or unspecified: Secondary | ICD-10-CM | POA: Diagnosis not present

## 2018-04-20 DIAGNOSIS — O209 Hemorrhage in early pregnancy, unspecified: Secondary | ICD-10-CM

## 2018-04-20 DIAGNOSIS — O10911 Unspecified pre-existing hypertension complicating pregnancy, first trimester: Secondary | ICD-10-CM

## 2018-04-20 DIAGNOSIS — Z3A11 11 weeks gestation of pregnancy: Secondary | ICD-10-CM | POA: Insufficient documentation

## 2018-04-20 DIAGNOSIS — O418X11 Other specified disorders of amniotic fluid and membranes, first trimester, fetus 1: Secondary | ICD-10-CM

## 2018-04-20 DIAGNOSIS — O468X1 Other antepartum hemorrhage, first trimester: Secondary | ICD-10-CM

## 2018-04-20 DIAGNOSIS — O30041 Twin pregnancy, dichorionic/diamniotic, first trimester: Secondary | ICD-10-CM

## 2018-04-20 DIAGNOSIS — G43909 Migraine, unspecified, not intractable, without status migrainosus: Secondary | ICD-10-CM | POA: Diagnosis not present

## 2018-04-20 DIAGNOSIS — O26891 Other specified pregnancy related conditions, first trimester: Secondary | ICD-10-CM | POA: Diagnosis not present

## 2018-04-20 LAB — URINALYSIS, ROUTINE W REFLEX MICROSCOPIC
Bilirubin Urine: NEGATIVE
GLUCOSE, UA: NEGATIVE mg/dL
Ketones, ur: NEGATIVE mg/dL
Nitrite: NEGATIVE
PROTEIN: 100 mg/dL — AB
Specific Gravity, Urine: 1.033 — ABNORMAL HIGH (ref 1.005–1.030)
pH: 5 (ref 5.0–8.0)

## 2018-04-20 MED ORDER — LABETALOL HCL 100 MG PO TABS: 100.0000 mg | ORAL_TABLET | Freq: Two times a day (BID) | ORAL | 2 refills | Status: DC

## 2018-04-20 NOTE — Addendum Note (Signed)
Addended by: Bethanne Ginger on: 04/20/2018 01:45 PM   Modules accepted: Orders

## 2018-04-20 NOTE — MAU Provider Note (Signed)
Chief Complaint: Vaginal Bleeding   First Provider Initiated Contact with Patient 04/20/18 2228        SUBJECTIVE HPI: [name] [name] is a 32 y.o. [id number] at [redacted]w[redacted]d by LMP who presents to maternity admissions reporting vaginal bleeding today which soaked underwear. Did not wear a pad.  Has some cramping at times. . She denies vaginal itching/burning, urinary symptoms, h/a, dizziness, n/v, or fever/chills.     Vaginal Bleeding  The patient's primary symptoms include pelvic pain and vaginal bleeding. The patient's pertinent negatives include no genital itching, genital lesions or genital odor. This is a recurrent problem. The current episode started today. The problem occurs intermittently. The pain is mild. She is pregnant. Associated symptoms include abdominal pain. Pertinent negatives include no back pain, chills, constipation, diarrhea, fever, headaches, nausea or vomiting. The vaginal discharge was bloody. The vaginal bleeding is lighter than menses. She has not been passing clots. She has not been passing tissue. Nothing aggravates the symptoms. She has tried nothing for the symptoms.   RN Note Pt reports bleeding since 3pm. Pt describes bleeding that is bright red like a period. Pt reports mild cramping now, but earlier the pain felt like "tearing"      Past Medical History:  Diagnosis Date  . Abnormal Pap smear    f/u was normal  . Depression    doind good now  . Headache(784.0)   . Infection    trich, chlamydia, gonorrhea  . Ovarian cyst   . Urinary tract infection    Past Surgical History:  Procedure Laterality Date  . CESAREAN SECTION    . implanon removal 08/2012  08/2012  . TONSILLECTOMY    . WISDOM TOOTH EXTRACTION     Social History   Socioeconomic History  . Marital status: Single    Spouse name: Not on file  . Number of children: 4  . Years of education: 68  . Highest education level: Not on file  Occupational History  . Not on file  Social Needs  .  Financial resource strain: Not on file  . Food insecurity:    Worry: Not on file    Inability: Not on file  . Transportation needs:    Medical: Not on file    Non-medical: Not on file  Tobacco Use  . Smoking status: Never Smoker  . Smokeless tobacco: Never Used  . Tobacco comment: rare  Substance and Sexual Activity  . Alcohol use: Yes    Alcohol/week: 1.2 oz    Types: 1 Glasses of wine, 1 Shots of liquor per week    Comment: Social  . Drug use: Yes    Types: Marijuana  . Sexual activity: Yes    Birth control/protection: Condom  Lifestyle  . Physical activity:    Days per week: Not on file    Minutes per session: Not on file  . Stress: Not on file  Relationships  . Social connections:    Talks on phone: Not on file    Gets together: Not on file    Attends religious service: Not on file    Active member of club or organization: Not on file    Attends meetings of clubs or organizations: Not on file    Relationship status: Not on file  . Intimate partner violence:    Fear of current or ex partner: Not on file    Emotionally abused: Not on file    Physically abused: Not on file    Forced sexual activity:  Not on file  Other Topics Concern  . Not on file  Social History Narrative   Lives at home with four children   Drinks soda occasionally    No current facility-administered medications on file prior to encounter.    Current Outpatient Medications on File Prior to Encounter  Medication Sig Dispense Refill  . aspirin EC 81 MG tablet Take 1 tablet (81 mg total) by mouth daily. Take after 12 weeks for prevention of preeclampsia later in pregnancy 300 tablet 2  . Prenatal Vit-Fe Fumarate-FA (PRENATAL PLUS/IRON) 27-1 MG TABS Take 1 tablet by mouth daily. 90 each 6  . promethazine (PHENERGAN) 25 MG tablet Take 1 tablet (25 mg total) by mouth every 6 (six) hours as needed for nausea or vomiting. 30 tablet 3   No Known Allergies  I have reviewed patient's Past Medical Hx,  Surgical Hx, Family Hx, Social Hx, medications and allergies.   ROS:  Review of Systems  Constitutional: Negative for chills and fever.  Gastrointestinal: Positive for abdominal pain. Negative for constipation, diarrhea, nausea and vomiting.  Genitourinary: Positive for pelvic pain and vaginal bleeding.  Musculoskeletal: Negative for back pain.  Neurological: Negative for headaches.   Review of Systems  Other systems negative   Physical Exam  Physical Exam Patient Vitals for the past 24 hrs:  BP Temp Temp src Pulse Resp SpO2 Height Weight  04/20/18 2206 (!) 149/96 - - (!) 102 - - - -  04/20/18 2159 (!) 156/80 98.8 F (37.1 C) Oral 99 16 100 % 5\' 2"  (1.575 m) 257 lb (116.6 kg)   Constitutional: Well-developed, well-nourished female in no acute distress.  Cardiovascular: normal rate Respiratory: normal effort GI: Abd soft, non-tender. Pos BS x 4 MS: Extremities nontender, no edema, normal ROM Neurologic: Alert and oriented x 4.  GU: Neg CVAT.  PELVIC EXAM: Cervix pink, visually closed, without lesion, Small pool of dark red blood, vaginal walls and external genitalia normal Bimanual exam: Cervix 0/long/high, firm, anterior, neg CMT, uterus nontender, nonenlarged, adnexa without tenderness, enlargement, or mass    LAB RESULTS Results for orders placed or performed during the hospital encounter of 04/20/18 (from the past 24 hour(s))  Urinalysis, Routine w reflex microscopic     Status: Abnormal   Collection Time: 04/20/18 10:10 PM  Result Value Ref Range   Color, Urine YELLOW YELLOW   APPearance CLOUDY (A) CLEAR   Specific Gravity, Urine 1.033 (H) 1.005 - 1.030   pH 5.0 5.0 - 8.0   Glucose, UA NEGATIVE NEGATIVE mg/dL   Hgb urine dipstick LARGE (A) NEGATIVE   Bilirubin Urine NEGATIVE NEGATIVE   Ketones, ur NEGATIVE NEGATIVE mg/dL   Protein, ur 100 (A) NEGATIVE mg/dL   Nitrite NEGATIVE NEGATIVE   Leukocytes, UA TRACE (A) NEGATIVE   RBC / HPF >50 (H) 0 - 5 RBC/hpf   WBC,  UA 21-50 0 - 5 WBC/hpf   Bacteria, UA RARE (A) NONE SEEN   Squamous Epithelial / LPF 11-20 0 - 5    --/--/A NEG (04/19 1502)  IMAGING US Ob Comp Less 14 Wks  Result Date: 04/20/2018 CLINICAL DATA:  Pregnant patient in first-trimester twin pregnancy with moderate vaginal bleeding today. EXAM: TWIN OBSTETRICAL ULTRASOUND <14 WKS COMPARISON:  Obstetric ultrasound 03/19/2018 FINDINGS: Number of IUPs:  2 Chorionicity/Amnionicity:  Dichorionic diamniotic TWIN 1 Yolk sac:  Not Visualized. Embryo:  Visualized. Cardiac Activity: Visualized. Heart Rate: 168 bpm CRL:   52.3 mm   11 w 6 d  [place] EDC: 11/03/2018 TWIN 2 Yolk sac:  Not Visualized. Embryo:  Visualized. Cardiac Activity: Visualized. Heart Rate: 165 bpm CRL:   54 mm   12 w 0 d                  [place] EDC: 11/02/2018 Subchorionic hemorrhage:  Small, similar in size to prior exam. Maternal uterus/adnexae: Corpus luteal cyst in the right ovary, normal ovarian blood flow. The left ovary is normal. No pelvic free fluid or adnexal mass. IMPRESSION: Live twin intrauterine pregnancy, estimated gestational age [redacted] weeks 6 days and 12 weeks 0 days based on crown-rump length. Small subchorionic hemorrhage which appears similar to prior exam. [facility] 11/03/2018. Electronically Signed   By: Jeb Levering M.D.   On: 04/20/2018 23:33   US Ob Comp Addl Gest Less 14 Wks  Result Date: 04/20/2018 CLINICAL DATA:  Pregnant patient in first-trimester twin pregnancy with moderate vaginal bleeding today. EXAM: TWIN OBSTETRICAL ULTRASOUND <14 WKS COMPARISON:  Obstetric ultrasound 03/19/2018 FINDINGS: Number of IUPs:  2 Chorionicity/Amnionicity:  Dichorionic diamniotic TWIN 1 Yolk sac:  Not Visualized. Embryo:  Visualized. Cardiac Activity: Visualized. Heart Rate: 168 bpm CRL:   52.3 mm   11 w 6 d                  [place] EDC: 11/03/2018 TWIN 2 Yolk sac:  Not Visualized. Embryo:  Visualized. Cardiac Activity: Visualized. Heart Rate: 165 bpm CRL:   54 mm   12 w 0 d                   [place] EDC: 11/02/2018 Subchorionic hemorrhage:  Small, similar in size to prior exam. Maternal uterus/adnexae: Corpus luteal cyst in the right ovary, normal ovarian blood flow. The left ovary is normal. No pelvic free fluid or adnexal mass. IMPRESSION: Live twin intrauterine pregnancy, estimated gestational age [redacted] weeks 6 days and 12 weeks 0 days based on crown-rump length. Small subchorionic hemorrhage which appears similar to prior exam. [facility] 11/03/2018. Electronically Signed   By: Jeb Levering M.D.   On: 04/20/2018 23:33     MAU Management/MDM: Ordered US which verified viability of twins.  Small Subchorionic hemorrhage, same size as before Discussed bleeding may continue a while longer until [facility] heals. Hypertension noted.  Per Dr Ilda Basset, will start on low dose of Labetalol 100mg  bid Consult Dr Ilda Basset with presentation, exam findings, and results.   On way out, pt states is having recurrent migraines. WIll rx Fioricet   ASSESSMENT Twin IUP at [redacted]w[redacted]d Bleeding in pregnancy Small subchorionic hemorrhage Hypertension, ?chronic Migraines  PLAN Discharge home Conservative care Pelvic rest Rx Fioricet for headaches Rx Labetalol 100mg  bid for hypertension  Pt stable at time of discharge. Encouraged to return here or to other Urgent Care/ED if she develops worsening of symptoms, increase in pain, fever, or other concerning symptoms.    Hansel Feinstein CNM, MSN Certified Nurse-Midwife 04/20/2018  10:28 PM

## 2018-04-20 NOTE — MAU Note (Signed)
Pt reports bleeding since 3pm. Pt describes bleeding that is bright red like a period. Pt reports mild cramping now, but earlier the pain felt like "tearing"

## 2018-04-20 NOTE — Discharge Instructions (Signed)
Pelvic Rest °Pelvic rest may be recommended if: °· Your placenta is partially or completely covering the opening of your cervix (placenta previa). °· There is bleeding between the wall of the uterus and the amniotic sac in the first trimester of pregnancy (subchorionic hemorrhage). °· You went into labor too early (preterm labor). ° °Based on your overall health and the health of your baby, your health care provider will decide if pelvic rest is right for you. °How do I rest my pelvis? °For as long as told by your health care provider: °· Do not have sex, sexual stimulation, or an orgasm. °· Do not use tampons. Do not douche. Do not put anything in your vagina. °· Do not lift anything that is heavier than 10 lb (4.5 kg). °· Avoid activities that take a lot of effort (are strenuous). °· Avoid any activity in which your pelvic muscles could become strained. ° °When should I seek medical care? °Seek medical care if you have: °· Cramping pain in your lower abdomen. °· Vaginal discharge. °· A low, dull backache. °· Regular contractions. °· Uterine tightening. ° °When should I seek immediate medical care? °Seek immediate medical care if: °· You have vaginal bleeding and you are pregnant. ° °This information is not intended to replace advice given to you by your health care provider. Make sure you discuss any questions you have with your health care provider. °Document Released: 03/14/2011 Document Revised: 04/24/2016 Document Reviewed: 05/21/2015 °Elsevier Interactive Patient Education © 2018 Elsevier Inc. ° °Subchorionic Hematoma °A subchorionic hematoma is a gathering of blood between the outer wall of the placenta and the inner wall of the womb (uterus). The placenta is the organ that connects the fetus to the wall of the uterus. The placenta performs the feeding, breathing (oxygen to the fetus), and waste removal (excretory work) of the fetus. °Subchorionic hematoma is the most common abnormality found on a result  from ultrasonography done during the first trimester or early second trimester of pregnancy. If there has been little or no vaginal bleeding, early small hematomas usually shrink on their own and do not affect your baby or pregnancy. The blood is gradually absorbed over 1-2 weeks. When bleeding starts later in pregnancy or the hematoma is larger or occurs in an older pregnant woman, the outcome may not be as good. Larger hematomas may get bigger, which increases the chances for miscarriage. Subchorionic hematoma also increases the risk of premature detachment of the placenta from the uterus, preterm (premature) labor, and stillbirth. °Follow these instructions at home: °· Stay on bed rest if your health care provider recommends this. Although bed rest will not prevent more bleeding or prevent a miscarriage, your health care provider may recommend bed rest until you are advised otherwise. °· Avoid heavy lifting (more than 10 lb [4.5 kg]), exercise, sexual intercourse, or douching as directed by your health care provider. °· Keep track of the number of pads you use each day and how soaked (saturated) they are. Write down this information. °· Do not use tampons. °· Keep all follow-up appointments as directed by your health care provider. Your health care provider may ask you to have follow-up blood tests or ultrasound tests or both. °Get help right away if: °· You have severe cramps in your stomach, back, abdomen, or pelvis. °· You have a fever. °· You pass large clots or tissue. Save any tissue for your health care provider to look at. °· Your bleeding increases or you become lightheaded,   feel weak, or have fainting episodes. °This information is not intended to replace advice given to you by your health care provider. Make sure you discuss any questions you have with your health care provider. °Document Released: 03/04/2007 Document Revised: 04/24/2016 Document Reviewed: 06/16/2013 °Elsevier Interactive Patient  Education © 2017 Elsevier Inc. ° °

## 2018-04-21 ENCOUNTER — Telehealth: Payer: Self-pay | Admitting: General Practice

## 2018-04-21 MED ORDER — BUTALBITAL-APAP-CAFFEINE 50-325-40 MG PO CAPS: 1.0000 | ORAL_CAPSULE | Freq: Four times a day (QID) | ORAL | 3 refills | Status: DC | PRN

## 2018-04-21 NOTE — Telephone Encounter (Signed)
Left message on VM for patient to give our office a call back in regards to appointment on 04/30/18 @ 10:15am for BP check.

## 2018-04-28 ENCOUNTER — Encounter: Payer: Self-pay | Admitting: *Deleted

## 2018-04-28 LAB — SMN1 COPY NUMBER ANALYSIS (SMA CARRIER SCREENING)

## 2018-04-28 LAB — OBSTETRIC PANEL, INCLUDING HIV
BASOS: 0 %
Basophils Absolute: 0 10*3/uL (ref 0.0–0.2)
EOS (ABSOLUTE): 0.1 10*3/uL (ref 0.0–0.4)
Eos: 2 %
HEMATOCRIT: 35.6 % (ref 34.0–46.6)
HEMOGLOBIN: 11.7 g/dL (ref 11.1–15.9)
HEP B S AG: NEGATIVE
HIV Screen 4th Generation wRfx: NONREACTIVE
IMMATURE GRANS (ABS): 0 10*3/uL (ref 0.0–0.1)
Immature Granulocytes: 0 %
Lymphocytes Absolute: 2.3 10*3/uL (ref 0.7–3.1)
Lymphs: 25 %
MCH: 31.6 pg (ref 26.6–33.0)
MCHC: 32.9 g/dL (ref 31.5–35.7)
MCV: 96 fL (ref 79–97)
MONOCYTES: 4 %
Monocytes Absolute: 0.3 10*3/uL (ref 0.1–0.9)
NEUTROS ABS: 6.4 10*3/uL (ref 1.4–7.0)
Neutrophils: 69 %
Platelets: 385 10*3/uL (ref 150–450)
RBC: 3.7 x10E6/uL — ABNORMAL LOW (ref 3.77–5.28)
RDW: 13.3 % (ref 12.3–15.4)
RPR: NONREACTIVE
Rh Factor: NEGATIVE
Rubella Antibodies, IGG: 2.86 index (ref >0.99)
WBC: 9.2 10*3/uL (ref 3.4–10.8)

## 2018-04-28 LAB — HEMOGLOBINOPATHY EVALUATION
Ferritin: 54 ng/mL (ref 15–150)
HGB F QUANT: 0 % (ref 0.0–2.0)
HGB SOLUBILITY: NEGATIVE
Hgb A2 Quant: 2.3 % (ref 1.8–3.2)
Hgb A: 97.7 % (ref 96.4–98.8)
Hgb C: 0 %
Hgb S: 0 %
Hgb Variant: 0 %

## 2018-04-28 LAB — COMPREHENSIVE METABOLIC PANEL
ALK PHOS: 63 IU/L (ref 39–117)
ALT: 26 IU/L (ref 0–32)
AST: 19 IU/L (ref 0–40)
Albumin/Globulin Ratio: 1.3 (ref 1.2–2.2)
Albumin: 4 g/dL (ref 3.5–5.5)
BUN/Creatinine Ratio: 17 (ref 9–23)
BUN: 9 mg/dL (ref 6–20)
Bilirubin Total: <0.2 mg/dL (ref 0.0–1.2)
CALCIUM: 9.7 mg/dL (ref 8.7–10.2)
CO2: 22 mmol/L (ref 20–29)
CREATININE: 0.53 mg/dL — AB (ref 0.57–1.00)
Chloride: 98 mmol/L (ref 96–106)
GFR calc Af Amer: 147 mL/min/{1.73_m2} (ref >59)
GFR, EST NON AFRICAN AMERICAN: 128 mL/min/{1.73_m2} (ref >59)
GLUCOSE: 69 mg/dL (ref 65–99)
Globulin, Total: 3.1 g/dL (ref 1.5–4.5)
Potassium: 4.2 mmol/L (ref 3.5–5.2)
SODIUM: 137 mmol/L (ref 134–144)
Total Protein: 7.1 g/dL (ref 6.0–8.5)

## 2018-04-28 LAB — AB SCR+ANTIBODY ID: ANTIBODY SCREEN: POSITIVE — AB

## 2018-04-28 LAB — CYSTIC FIBROSIS GENE TEST

## 2018-04-29 ENCOUNTER — Encounter: Payer: Self-pay | Admitting: *Deleted

## 2018-04-30 ENCOUNTER — Ambulatory Visit: Payer: Medicaid Other

## 2018-05-04 ENCOUNTER — Ambulatory Visit (INDEPENDENT_AMBULATORY_CARE_PROVIDER_SITE_OTHER): Payer: Medicaid Other | Admitting: General Practice

## 2018-05-04 VITALS — BP 114/74 | HR 82 | Ht 62.0 in | Wt 250.0 lb

## 2018-05-04 DIAGNOSIS — Z0130 Encounter for examination of blood pressure without abnormal findings: Secondary | ICD-10-CM

## 2018-05-04 NOTE — Progress Notes (Signed)
Patient presents to office today for blood pressure check. Patient reports headaches & dizziness but hasn't experienced that in a couple days now. No edema noted. Patient states she hasn't picked up her labetalol Rx yet because she's been too busy. BP is WNL today. Recommended she pick up Rx and follow up at next prenatal appt on 6/20. Patient verbalized understanding & had no questions.

## 2018-05-04 NOTE — Progress Notes (Signed)
I agree with the nurses note and recommendation.  Noni Saupe I, NP 05/04/2018 10:11 AM

## 2018-05-06 ENCOUNTER — Telehealth: Payer: Self-pay

## 2018-05-06 ENCOUNTER — Encounter: Payer: Self-pay | Admitting: Registered"

## 2018-05-06 ENCOUNTER — Encounter: Payer: Medicaid Other | Attending: Obstetrics and Gynecology | Admitting: Registered"

## 2018-05-06 DIAGNOSIS — O30041 Twin pregnancy, dichorionic/diamniotic, first trimester: Secondary | ICD-10-CM | POA: Insufficient documentation

## 2018-05-06 DIAGNOSIS — Z713 Dietary counseling and surveillance: Secondary | ICD-10-CM | POA: Insufficient documentation

## 2018-05-06 DIAGNOSIS — O0991 Supervision of high risk pregnancy, unspecified, first trimester: Secondary | ICD-10-CM | POA: Diagnosis not present

## 2018-05-06 NOTE — Progress Notes (Signed)
Medical Nutrition Therapy:  Appt start time: 1130 end time:  1230.  Assessment:  Primary concerns today: Patient not sure why she is here, RD called her referring doctor and was told she needed basic nutrition in pregnancy advice. Patient states when she was pregnant with her 3rd child, the 1 hr test was abnormal, but the 3 hr test was normal and she was not diagnosed with GDM.   Pt states she does not take her prenatal vitamin regularly because she forgets.   Pt states she works at [employer] 26 hrs week for the last 4 months.  Relies on public transportation Uses WIC & EBT benefits  Patient states she does not like fruit, but named many vegetables she enjoys. Pt states she drinks juice and sweet tea (large size).   Preferred Learning Style:   No preference indicated   Learning Readiness:   Contemplating  MEDICATIONS: reviewed   DIETARY INTAKE:  24-hr recall:  B ( AM): none or eggs, bread sausage  Snk ( AM): ?  L ( PM): meat, corn, smashed potatoes Snk ( PM): ? D ( PM): [place], fish, shrimp or chicken, broccoli with cheese, cabbage Snk ( PM): ? Beverages: water, sweet tea, juice (WIC), 2% milk  Usual physical activity: ADL  Estimated energy needs: 2200 calories  Progress Towards Goal(s):  New goals.   Nutritional Diagnosis:  NB-1.1 Food and nutrition-related knowledge deficit As related to limited understading of macronutrients.  As evidenced by patient not sure sources of protein..    Intervention:  Nutrition Education. Balanced eating and food that are in each macronutrient group. Discussed importance of eating balanced meals and taking vitamins to meet dietary needs during pregnancy.   Consider having breakfast more often Aim to eat balanced meals with the starchy vegetables in the starch part of your plate Consider eating slowly and mindfully using hunger and fullness cues to know how much Consider using your SNAP benefits at the farmer's market (double your  money) Consider having tuna fish 1 or 2 times per week.  Drinks: Consider drinking smaller amounts of sweet tea or diluting with unsweet tea. Juice: see if you can get the Trop 50 with your benefits and keep you juice to 1/2 cup  Vitamins: Consider setting your phone alarm to remind you to take your prenatal vitamin  Teaching Method Utilized:  Visual Auditory  Handouts given during visit include:  [employer] for [employer] for [employer] accepting [employer] EBT   Barriers to learning/adherence to lifestyle change: learning disability   Demonstrated degree of understanding via:  Teach Back   Monitoring/Evaluation:  Dietary intake, exercise, and body weight prn.

## 2018-05-06 NOTE — Patient Instructions (Addendum)
Consider having breakfast more often Aim to eat balanced meals with the starchy vegetables in the starch part of your plate Consider eating slowly and mindfully using hunger and fullness cues to know how much Consider using your SNAP benefits at the farmer's market (double your money) Consider having tuna fish 1 or 2 times per week.  Drinks: Consider drinking smaller amounts of sweet tea or diluting with unsweet tea. Juice: see if you can get the Trop 50 with your benefits and keep you juice to 1/2 cup  Vitamins: Consider setting your phone alarm to remind you to take your prenatal vitamin

## 2018-05-06 NOTE — Telephone Encounter (Signed)
Rcvd call from [place], stating order from Provider was not clear on 04/19/18. Advised pt is not diabetic but obese & Provider wanted her to eat more healthier in the pregnancy.

## 2018-05-15 ENCOUNTER — Inpatient Hospital Stay (HOSPITAL_COMMUNITY)
Admission: AD | Admit: 2018-05-15 | Discharge: 2018-05-15 | Disposition: A | Discharge status: Home / Self Care | Payer: Medicaid Other | Source: Ambulatory Visit | Attending: Obstetrics and Gynecology | Admitting: Obstetrics and Gynecology

## 2018-05-15 ENCOUNTER — Encounter (HOSPITAL_COMMUNITY): Payer: Self-pay | Admitting: *Deleted

## 2018-05-15 DIAGNOSIS — R51 Headache: Secondary | ICD-10-CM | POA: Diagnosis not present

## 2018-05-15 DIAGNOSIS — O209 Hemorrhage in early pregnancy, unspecified: Secondary | ICD-10-CM | POA: Diagnosis not present

## 2018-05-15 DIAGNOSIS — O4692 Antepartum hemorrhage, unspecified, second trimester: Secondary | ICD-10-CM | POA: Diagnosis not present

## 2018-05-15 DIAGNOSIS — Z825 Family history of asthma and other chronic lower respiratory diseases: Secondary | ICD-10-CM | POA: Diagnosis not present

## 2018-05-15 DIAGNOSIS — O219 Vomiting of pregnancy, unspecified: Secondary | ICD-10-CM

## 2018-05-15 DIAGNOSIS — Z3A14 14 weeks gestation of pregnancy: Secondary | ICD-10-CM

## 2018-05-15 DIAGNOSIS — Z8249 Family history of ischemic heart disease and other diseases of the circulatory system: Secondary | ICD-10-CM | POA: Diagnosis not present

## 2018-05-15 DIAGNOSIS — O26892 Other specified pregnancy related conditions, second trimester: Secondary | ICD-10-CM | POA: Diagnosis not present

## 2018-05-15 DIAGNOSIS — Z79899 Other long term (current) drug therapy: Secondary | ICD-10-CM | POA: Insufficient documentation

## 2018-05-15 DIAGNOSIS — O30042 Twin pregnancy, dichorionic/diamniotic, second trimester: Secondary | ICD-10-CM | POA: Diagnosis not present

## 2018-05-15 DIAGNOSIS — Z7982 Long term (current) use of aspirin: Secondary | ICD-10-CM | POA: Insufficient documentation

## 2018-05-15 DIAGNOSIS — R1110 Vomiting, unspecified: Secondary | ICD-10-CM | POA: Diagnosis present

## 2018-05-15 DIAGNOSIS — R519 Headache, unspecified: Secondary | ICD-10-CM

## 2018-05-15 DIAGNOSIS — Z6791 Unspecified blood type, Rh negative: Secondary | ICD-10-CM

## 2018-05-15 LAB — URINALYSIS, ROUTINE W REFLEX MICROSCOPIC
Bilirubin Urine: NEGATIVE
GLUCOSE, UA: NEGATIVE mg/dL
Ketones, ur: NEGATIVE mg/dL
Nitrite: NEGATIVE
PROTEIN: 30 mg/dL — AB
Specific Gravity, Urine: 1.017 (ref 1.005–1.030)
pH: 6 (ref 5.0–8.0)

## 2018-05-15 LAB — CBC
HEMATOCRIT: 31.8 % — AB (ref 36.0–46.0)
HEMOGLOBIN: 10.9 g/dL — AB (ref 12.0–15.0)
MCH: 32.9 pg (ref 26.0–34.0)
MCHC: 34.3 g/dL (ref 30.0–36.0)
MCV: 96.1 fL (ref 78.0–100.0)
Platelets: 303 10*3/uL (ref 150–400)
RBC: 3.31 MIL/uL — AB (ref 3.87–5.11)
RDW: 13.1 % (ref 11.5–15.5)
WBC: 10.1 10*3/uL (ref 4.0–10.5)

## 2018-05-15 MED ORDER — ONDANSETRON 8 MG PO TBDP
8.0000 mg | ORAL_TABLET | Freq: Once | ORAL | Status: AC
  Administered 2018-05-15: 8 mg via ORAL
  Filled 2018-05-15: qty 1

## 2018-05-15 MED ORDER — ONDANSETRON 4 MG PO TBDP: 4.0000 mg | ORAL_TABLET | Freq: Three times a day (TID) | ORAL | 1 refills | Status: DC | PRN

## 2018-05-15 MED ORDER — PROMETHAZINE HCL 25 MG PO TABS: 25.0000 mg | ORAL_TABLET | Freq: Four times a day (QID) | ORAL | 1 refills | Status: DC | PRN

## 2018-05-15 MED ORDER — ACETAMINOPHEN 500 MG PO TABS
1000.0000 mg | ORAL_TABLET | Freq: Once | ORAL | Status: AC
  Administered 2018-05-15: 1000 mg via ORAL
  Filled 2018-05-15: qty 2

## 2018-05-15 MED ORDER — BUTALBITAL-APAP-CAFFEINE 50-325-40 MG PO CAPS: 1.0000 | ORAL_CAPSULE | Freq: Four times a day (QID) | ORAL | 0 refills | Status: DC | PRN

## 2018-05-15 MED ORDER — RHO D IMMUNE GLOBULIN 1500 UNIT/2ML IJ SOSY
300.0000 ug | PREFILLED_SYRINGE | Freq: Once | INTRAMUSCULAR | Status: AC
  Administered 2018-05-15: 300 ug via INTRAMUSCULAR
  Filled 2018-05-15: qty 2

## 2018-05-15 NOTE — MAU Note (Signed)
Pt states she was given medications for nausea but [facility] did not have the medications when she went to pick it up

## 2018-05-15 NOTE — Discharge Instructions (Signed)
General Headache Without Cause A headache is pain or discomfort felt around the head or neck area. The specific cause of a headache may not be found. There are many causes and types of headaches. A few common ones are:  Tension headaches.  Migraine headaches.  Cluster headaches.  Chronic daily headaches.  Follow these instructions at home: Watch your condition for any changes. Take these steps to help with your condition: Managing pain  Take over-the-counter and prescription medicines only as told by your health care provider.  Lie down in a dark, quiet room when you have a headache.  If directed, apply ice to the head and neck area: ? Put ice in a plastic bag. ? Place a towel between your skin and the bag. ? Leave the ice on for 20 minutes, 2-3 times per day.  Use a heating pad or hot shower to apply heat to the head and neck area as told by your health care provider.  Keep lights dim if bright lights bother you or make your headaches worse. Eating and drinking  Eat meals on a regular schedule.  Limit alcohol use.  Decrease the amount of caffeine you drink, or stop drinking caffeine. General instructions  Keep all follow-up visits as told by your health care provider. This is important.  Keep a headache journal to help find out what may trigger your headaches. For example, write down: ? What you eat and drink. ? How much sleep you get. ? Any change to your diet or medicines.  Try massage or other relaxation techniques.  Limit stress.  Sit up straight, and do not tense your muscles.  Do not use tobacco products, including cigarettes, chewing tobacco, or e-cigarettes. If you need help quitting, ask your health care provider.  Exercise regularly as told by your health care provider.  Sleep on a regular schedule. Get 7-9 hours of sleep, or the amount recommended by your health care provider. Contact a health care provider if:  Your symptoms are not helped by  medicine.  You have a headache that is different from the usual headache.  You have nausea or you vomit.  You have a fever. Get help right away if:  Your headache becomes severe.  You have repeated vomiting.  You have a stiff neck.  You have a loss of vision.  You have problems with speech.  You have pain in the eye or ear.  You have muscular weakness or loss of muscle control.  You lose your balance or have trouble walking.  You feel faint or pass out.  You have confusion. This information is not intended to replace advice given to you by your health care provider. Make sure you discuss any questions you have with your health care provider. Document Released: 11/17/2005 Document Revised: 04/24/2016 Document Reviewed: 03/12/2015 Elsevier Interactive Patient Education  2018 [employer]. Vaginal Bleeding During Pregnancy, Second Trimester A small amount of bleeding (spotting) from the vagina is relatively common in pregnancy. It usually stops on its own. Various things can cause bleeding or spotting in pregnancy. Some bleeding may be related to the pregnancy, and some may not. Sometimes the bleeding is normal and is not a problem. However, bleeding can also be a sign of something serious. Be sure to tell your health care provider about any vaginal bleeding right away. Some possible causes of vaginal bleeding during the second trimester include:  Infection, inflammation, or growths on the cervix.  The placenta may be partially or completely covering  the opening of the cervix inside the uterus (placenta previa).  The placenta may have separated from the uterus (abruption of the placenta).  You may be having early (preterm) labor.  The cervix may not be strong enough to keep a baby inside the uterus (cervical insufficiency).  Tiny cysts may have developed in the uterus instead of pregnancy tissue (molar pregnancy).  Follow these instructions at home: Watch your condition  for any changes. The following actions may help to lessen any discomfort you are feeling:  Follow your health care provider's instructions for limiting your activity. If your health care provider orders bed rest, you may need to stay in bed and only get up to use the bathroom. However, your health care provider may allow you to continue light activity.  If needed, make plans for someone to help with your regular activities and responsibilities while you are on bed rest.  Keep track of the number of pads you use each day, how often you change pads, and how soaked (saturated) they are. Write this down.  Do not use tampons. Do not douche.  Do not have sexual intercourse or orgasms until approved by your health care provider.  If you pass any tissue from your vagina, save the tissue so you can show it to your health care provider.  Only take over-the-counter or prescription medicines as directed by your health care provider.  Do not take aspirin because it can make you bleed.  Do not exercise or perform any strenuous activities or heavy lifting without your health care provider's permission.  Keep all follow-up appointments as directed by your health care provider.  Contact a health care provider if:  You have any vaginal bleeding during any part of your pregnancy.  You have cramps or labor pains.  You have a fever, not controlled by medicine. Get help right away if:  You have severe cramps in your back or belly (abdomen).  You have contractions.  You have chills.  You pass large clots or tissue from your vagina.  Your bleeding increases.  You feel light-headed or weak, or you have fainting episodes.  You are leaking fluid or have a gush of fluid from your vagina. This information is not intended to replace advice given to you by your health care provider. Make sure you discuss any questions you have with your health care provider. Document Released: 08/27/2005 Document  Revised: 04/24/2016 Document Reviewed: 07/25/2013 Elsevier Interactive Patient Education  [employer].

## 2018-05-15 NOTE — MAU Provider Note (Signed)
History     CSN: [id number]  Arrival date and time: 05/15/18 8938   First Provider Initiated Contact with Patient 05/15/18 0945      Chief Complaint  Patient presents with  . Emesis  . Headache  . Abdominal Pain   HPI  [name] [name] is a 32 y.o. [id number] at [redacted]w[redacted]d with di/di twins who presents with headache, n/v, and vaginal bleeding.  Patient with history of headaches. Rates current headache 6/10. Pain is constant. Has not treated symptoms.  Continued nausea and vomiting with pregnancy. Was previuosly prescribed phenergan in April but never got it filled b/c it was sent to the wrong pharmacy. States she vomits 2-3 times per day this week. Currently nauseated.  Diagnosed with [facility] in April. Reports brown spotting daily since then. Since yesterday has had pink/red spotting. Not bleeding into pad or passing clots. Reports lower abdominal cramping that she rates 4/10. Has not had intercourse since April.    OB History    Gravida  6   Para  5   Term  5   Preterm  0   AB  0   Living  5     SAB  0   TAB  0   Ectopic  0   Multiple  0   Live Births  5           Past Medical History:  Diagnosis Date  . Abnormal Pap smear    f/u was normal  . Depression    doind good now  . Headache(784.0)   . Infection    trich, chlamydia, gonorrhea  . Ovarian cyst   . Urinary tract infection     Past Surgical History:  Procedure Laterality Date  . CESAREAN SECTION    . implanon removal 08/2012  08/2012  . TONSILLECTOMY    . WISDOM TOOTH EXTRACTION      Family History  Problem Relation Age of Onset  . Hypertension Mother   . Asthma Brother   . Cancer Maternal Uncle        lung  . Diabetes Maternal Grandmother   . Cancer Maternal Grandmother        thyroid  . Hypertension Maternal Grandmother   . Other Neg Hx     Social History   Tobacco Use  . Smoking status: Never Smoker  . Smokeless tobacco: Never Used  Substance Use Topics  . Alcohol use: Not Currently     Alcohol/week: 1.2 oz    Types: 1 Glasses of wine, 1 Shots of liquor per week    Comment: Not currently  . Drug use: Not Currently    Types: Marijuana    Comment: Not currently    Allergies: No Known Allergies  Medications Prior to Admission  Medication Sig Dispense Refill Last Dose  . aspirin EC 81 MG tablet Take 1 tablet (81 mg total) by mouth daily. Take after 12 weeks for prevention of preeclampsia later in pregnancy 300 tablet 2 Unknown at Unknown time  . Butalbital-APAP-Caffeine 50-325-40 MG capsule Take 1-2 capsules by mouth every 6 (six) hours as needed for headache. 30 capsule 3   . labetalol (NORMODYNE) 100 MG tablet Take 1 tablet (100 mg total) by mouth every 12 (twelve) hours. 60 tablet 2   . Prenatal Vit-Fe Fumarate-FA (PRENATAL PLUS/IRON) 27-1 MG TABS Take 1 tablet by mouth daily. 90 each 6 Past Week at Unknown time  . promethazine (PHENERGAN) 25 MG tablet Take 1 tablet (25 mg total) by mouth every  6 (six) hours as needed for nausea or vomiting. 30 tablet 3 Unknown at Unknown time    Review of Systems  Constitutional: Negative.   Gastrointestinal: Positive for abdominal pain, nausea and vomiting. Negative for constipation and diarrhea.  Genitourinary: Positive for vaginal bleeding. Negative for dysuria and vaginal discharge.  Neurological: Positive for headaches.   Physical Exam   Blood pressure 130/78, pulse 100, temperature 98.4 F (36.9 C), temperature source Oral, resp. rate 16, weight 253 lb 1.3 oz (114.8 kg), last menstrual period 02/01/2018, SpO2 98 %, unknown if currently breastfeeding.  Physical Exam  Nursing note and vitals reviewed. Constitutional: She is oriented to person, place, and time. She appears well-developed and well-nourished. No distress.  HENT:  Head: Normocephalic and atraumatic.  Eyes: Conjunctivae are normal. Right eye exhibits no discharge. Left eye exhibits no discharge. No scleral icterus.  Neck: Normal range of motion.   Cardiovascular: Normal rate, regular rhythm and normal heart sounds.  No murmur heard. Respiratory: Effort normal and breath sounds normal. No respiratory distress. She has no wheezes.  GI: Soft. There is no tenderness.  Genitourinary: No bleeding in the vagina. No vaginal discharge found.  Genitourinary Comments: Cervix closed  Neurological: She is alert and oriented to person, place, and time.  Skin: Skin is warm and dry. She is not diaphoretic.  Psychiatric: She has a normal mood and affect. Her behavior is normal. Judgment and thought content normal.    MAU Course  Procedures Results for orders placed or performed during the hospital encounter of 05/15/18 (from the past 24 hour(s))  Urinalysis, Routine w reflex microscopic     Status: Abnormal   Collection Time: 05/15/18  9:05 AM  Result Value Ref Range   Color, Urine YELLOW YELLOW   APPearance HAZY (A) CLEAR   Specific Gravity, Urine 1.017 1.005 - 1.030   pH 6.0 5.0 - 8.0   Glucose, UA NEGATIVE NEGATIVE mg/dL   Hgb urine dipstick MODERATE (A) NEGATIVE   Bilirubin Urine NEGATIVE NEGATIVE   Ketones, ur NEGATIVE NEGATIVE mg/dL   Protein, ur 30 (A) NEGATIVE mg/dL   Nitrite NEGATIVE NEGATIVE   Leukocytes, UA SMALL (A) NEGATIVE   RBC / HPF 6-10 0 - 5 RBC/hpf   WBC, UA 21-50 0 - 5 WBC/hpf   Bacteria, UA RARE (A) NONE SEEN   Squamous Epithelial / LPF 6-10 0 - 5   Mucus PRESENT   CBC     Status: Abnormal   Collection Time: 05/15/18 10:00 AM  Result Value Ref Range   WBC 10.1 4.0 - 10.5 K/uL   RBC 3.31 (L) 3.87 - 5.11 MIL/uL   Hemoglobin 10.9 (L) 12.0 - 15.0 g/dL   HCT 31.8 (L) 36.0 - 46.0 %   MCV 96.1 78.0 - 100.0 fL   MCH 32.9 26.0 - 34.0 pg   MCHC 34.3 30.0 - 36.0 g/dL   RDW 13.1 11.5 - 15.5 %   Platelets 303 150 - 400 K/uL  Rh IG workup (includes ABO/Rh)     Status: None (Preliminary result)   Collection Time: 05/15/18 10:00 AM  Result Value Ref Range   Gestational Age(Wks) 14    ABO/RH(D) A NEG    Antibody Screen  NEG    Unit Number [id number]    Blood Component Type RHIG    Unit division 00    Status of Unit ISSUED    Transfusion Status      OK TO TRANSFUSE Performed at [facility], [place],  [place] [address]     MDM FHT present by doppler. RN unsure if 2 different.  Pt informed that the ultrasound is considered a limited OB ultrasound and is not intended to be a complete ultrasound exam.  Patient also informed that the ultrasound is not being completed with the intent of assessing for fetal or placental anomalies or any pelvic abnormalities.  Explained that the purpose of today's ultrasound is to assess for  viability.  Patient acknowledges the purpose of the exam and the limitations of the study.   IUP x 2. Both active with cardiac activity present  No active bleeding and cervix closed  A negative. Received rhogam in April. Discussed with Dr. Rosana Hoes. Will repeat rhogam today.   Zofran & tylenol given in MAU. Pt reports improvement in symptoms. Pt requesting refill for Fioricet. Discussed concerned for withdrawal headache. Pt understands.     Assessment and Plan  A: 1. Nausea and vomiting during pregnancy prior to [redacted] weeks gestation   2. Rh negative state in antepartum period, second trimester   3. Dichorionic diamniotic twin pregnancy in second trimester   4. [redacted] weeks gestation of pregnancy   5. Vaginal bleeding in pregnancy, second trimester   6. Pregnancy headache in second trimester    P: Discharge home Rx fioricet --- discussed start with tylenol -- use Fioricet sparingly Rx phenergan & zofran Discussed reasons to return to MAU Continue pelvic rest  Jorje Guild 05/15/2018, 9:45 AM

## 2018-05-15 NOTE — MAU Note (Signed)
.  [name] [name] is a 32 y.o. at [redacted]w[redacted]d here in MAU reporting: nausea and vomiting that started this morning.  +vaginal bleeding spotting in nature for past 2-3 days. Brown in color originally to now pinkish red. Not having to wear a pad. Denies recent intercourse +lower abdominal pain. Cramping in nature +headache Vitals:   05/15/18 0918  BP: 137/66  Pulse: (!) 110  Resp: 17  Temp: 98.4 F (36.9 C)  SpO2: 98%    Onset of complaint: this morning primarily Pain score: abdominal pain 4/10 and headache 6/10 (patient talking on phone throughout triage despite RN explaining privacy needs)  AUQ:[id number] to auscultate in triage due to panus. Will attempt once in exam room Lab orders placed from triage: ua per standing order

## 2018-05-16 LAB — RH IG WORKUP (INCLUDES ABO/RH)
ABO/RH(D): A NEG
ANTIBODY SCREEN: NEGATIVE
Gestational Age(Wks): 14
UNIT DIVISION: 0

## 2018-05-20 ENCOUNTER — Encounter: Payer: Self-pay | Admitting: Obstetrics and Gynecology

## 2018-05-20 ENCOUNTER — Ambulatory Visit (INDEPENDENT_AMBULATORY_CARE_PROVIDER_SITE_OTHER): Payer: Medicaid Other | Admitting: Obstetrics and Gynecology

## 2018-05-20 ENCOUNTER — Other Ambulatory Visit (HOSPITAL_COMMUNITY)
Admission: RE | Admit: 2018-05-20 | Discharge: 2018-05-20 | Disposition: A | Discharge status: Home / Self Care | Payer: Medicaid Other | Source: Ambulatory Visit | Attending: Obstetrics and Gynecology | Admitting: Obstetrics and Gynecology

## 2018-05-20 VITALS — BP 98/63 | HR 105 | Wt 254.4 lb

## 2018-05-20 DIAGNOSIS — O30043 Twin pregnancy, dichorionic/diamniotic, third trimester: Secondary | ICD-10-CM

## 2018-05-20 DIAGNOSIS — O99210 Obesity complicating pregnancy, unspecified trimester: Secondary | ICD-10-CM

## 2018-05-20 DIAGNOSIS — O26892 Other specified pregnancy related conditions, second trimester: Secondary | ICD-10-CM

## 2018-05-20 DIAGNOSIS — O0991 Supervision of high risk pregnancy, unspecified, first trimester: Secondary | ICD-10-CM

## 2018-05-20 DIAGNOSIS — A609 Anogenital herpesviral infection, unspecified: Secondary | ICD-10-CM

## 2018-05-20 DIAGNOSIS — Z6841 Body Mass Index (BMI) 40.0 and over, adult: Secondary | ICD-10-CM

## 2018-05-20 DIAGNOSIS — O99810 Abnormal glucose complicating pregnancy: Secondary | ICD-10-CM

## 2018-05-20 DIAGNOSIS — O30042 Twin pregnancy, dichorionic/diamniotic, second trimester: Secondary | ICD-10-CM

## 2018-05-20 DIAGNOSIS — O0992 Supervision of high risk pregnancy, unspecified, second trimester: Secondary | ICD-10-CM | POA: Insufficient documentation

## 2018-05-20 DIAGNOSIS — Z8659 Personal history of other mental and behavioral disorders: Secondary | ICD-10-CM

## 2018-05-20 DIAGNOSIS — Z98891 History of uterine scar from previous surgery: Secondary | ICD-10-CM

## 2018-05-20 DIAGNOSIS — I10 Essential (primary) hypertension: Secondary | ICD-10-CM

## 2018-05-20 DIAGNOSIS — Z6791 Unspecified blood type, Rh negative: Secondary | ICD-10-CM

## 2018-05-20 DIAGNOSIS — N939 Abnormal uterine and vaginal bleeding, unspecified: Secondary | ICD-10-CM

## 2018-05-20 DIAGNOSIS — O210 Mild hyperemesis gravidarum: Secondary | ICD-10-CM

## 2018-05-20 LAB — POCT URINALYSIS DIP (DEVICE)
GLUCOSE, UA: NEGATIVE mg/dL
Ketones, ur: 15 mg/dL — AB
NITRITE: NEGATIVE
PROTEIN: 100 mg/dL — AB
Specific Gravity, Urine: 1.025 (ref 1.005–1.030)
UROBILINOGEN UA: 0.2 mg/dL (ref 0.0–1.0)
pH: 6.5 (ref 5.0–8.0)

## 2018-05-20 MED ORDER — LABETALOL HCL 100 MG PO TABS: 100.0000 mg | ORAL_TABLET | Freq: Every day | ORAL | 2 refills | Status: DC

## 2018-05-20 NOTE — Progress Notes (Signed)
States had a little bleeding after intercourse. States had some pain a few hours later.
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
## 2018-05-21 ENCOUNTER — Other Ambulatory Visit: Payer: Self-pay | Admitting: *Deleted

## 2018-05-21 DIAGNOSIS — O99810 Abnormal glucose complicating pregnancy: Secondary | ICD-10-CM

## 2018-05-21 DIAGNOSIS — O0991 Supervision of high risk pregnancy, unspecified, first trimester: Secondary | ICD-10-CM

## 2018-05-21 DIAGNOSIS — O30043 Twin pregnancy, dichorionic/diamniotic, third trimester: Secondary | ICD-10-CM

## 2018-05-21 LAB — CERVICOVAGINAL ANCILLARY ONLY
BACTERIAL VAGINITIS: POSITIVE — AB
CANDIDA VAGINITIS: NEGATIVE
Chlamydia: POSITIVE — AB
Neisseria Gonorrhea: POSITIVE — AB
Trichomonas: NEGATIVE

## 2018-05-21 LAB — PROTEIN / CREATININE RATIO, URINE
Creatinine, Urine: 215.3 mg/dL
PROTEIN/CREAT RATIO: 320 mg/g{creat} — AB (ref 0–200)
Protein, Ur: 68.8 mg/dL

## 2018-05-22 LAB — CMP AND LIVER
ALK PHOS: 63 IU/L (ref 39–117)
ALT: 22 IU/L (ref 0–32)
AST: 20 IU/L (ref 0–40)
Albumin: 3.6 g/dL (ref 3.5–5.5)
BILIRUBIN, DIRECT: 0.07 mg/dL (ref 0.00–0.40)
BUN: 9 mg/dL (ref 6–20)
CHLORIDE: 102 mmol/L (ref 96–106)
CO2: 21 mmol/L (ref 20–29)
Calcium: 9.3 mg/dL (ref 8.7–10.2)
Creatinine, Ser: 0.54 mg/dL — ABNORMAL LOW (ref 0.57–1.00)
GFR calc non Af Amer: 127 mL/min/{1.73_m2} (ref >59)
GFR, EST AFRICAN AMERICAN: 146 mL/min/{1.73_m2} (ref >59)
Glucose: 101 mg/dL — ABNORMAL HIGH (ref 65–99)
Potassium: 4 mmol/L (ref 3.5–5.2)
Sodium: 136 mmol/L (ref 134–144)
Total Protein: 6.1 g/dL (ref 6.0–8.5)

## 2018-05-22 LAB — AFP, SERUM, OPEN SPINA BIFIDA
AFP MOM: 5.04
AFP VALUE AFPOSL: 119.8 ng/mL
Gest. Age on Collection Date: 15.3 weeks
MATERNAL AGE AT EDD: 30.6 a
OSBR Risk 1 IN: 142
TEST RESULTS AFP: NEGATIVE
WEIGHT: 254 [lb_av]

## 2018-05-22 LAB — TSH: TSH: 0.168 u[IU]/mL — ABNORMAL LOW (ref 0.450–4.500)

## 2018-05-24 ENCOUNTER — Encounter: Payer: Self-pay | Admitting: Obstetrics and Gynecology

## 2018-05-24 DIAGNOSIS — A64 Unspecified sexually transmitted disease: Secondary | ICD-10-CM | POA: Insufficient documentation

## 2018-05-24 DIAGNOSIS — O1210 Gestational proteinuria, unspecified trimester: Secondary | ICD-10-CM | POA: Insufficient documentation

## 2018-05-24 DIAGNOSIS — R7989 Other specified abnormal findings of blood chemistry: Secondary | ICD-10-CM | POA: Insufficient documentation

## 2018-05-24 DIAGNOSIS — O98319 Other infections with a predominantly sexual mode of transmission complicating pregnancy, unspecified trimester: Secondary | ICD-10-CM

## 2018-05-25 ENCOUNTER — Other Ambulatory Visit: Payer: Medicaid Other

## 2018-05-25 ENCOUNTER — Other Ambulatory Visit: Payer: Self-pay | Admitting: Obstetrics and Gynecology

## 2018-05-25 DIAGNOSIS — O30043 Twin pregnancy, dichorionic/diamniotic, third trimester: Secondary | ICD-10-CM

## 2018-05-25 DIAGNOSIS — O0991 Supervision of high risk pregnancy, unspecified, first trimester: Secondary | ICD-10-CM

## 2018-05-25 DIAGNOSIS — O99810 Abnormal glucose complicating pregnancy: Secondary | ICD-10-CM

## 2018-05-26 LAB — GLUCOSE TOLERANCE, 2 HOURS W/ 1HR
GLUCOSE, 1 HOUR: 141 mg/dL (ref 65–179)
GLUCOSE, 2 HOUR: 85 mg/dL (ref 65–152)
Glucose, Fasting: 79 mg/dL (ref 65–91)

## 2018-05-31 ENCOUNTER — Encounter (HOSPITAL_COMMUNITY): Payer: Self-pay

## 2018-05-31 ENCOUNTER — Inpatient Hospital Stay (HOSPITAL_COMMUNITY)
Admission: AD | Admit: 2018-05-31 | Discharge: 2018-05-31 | Disposition: A | Discharge status: Home / Self Care | Payer: Medicaid Other | Source: Ambulatory Visit | Attending: Obstetrics and Gynecology | Admitting: Obstetrics and Gynecology

## 2018-05-31 DIAGNOSIS — O219 Vomiting of pregnancy, unspecified: Secondary | ICD-10-CM

## 2018-05-31 DIAGNOSIS — Z79899 Other long term (current) drug therapy: Secondary | ICD-10-CM | POA: Diagnosis not present

## 2018-05-31 DIAGNOSIS — A749 Chlamydial infection, unspecified: Secondary | ICD-10-CM | POA: Diagnosis not present

## 2018-05-31 DIAGNOSIS — Z801 Family history of malignant neoplasm of trachea, bronchus and lung: Secondary | ICD-10-CM | POA: Insufficient documentation

## 2018-05-31 DIAGNOSIS — O210 Mild hyperemesis gravidarum: Secondary | ICD-10-CM

## 2018-05-31 DIAGNOSIS — O162 Unspecified maternal hypertension, second trimester: Secondary | ICD-10-CM | POA: Insufficient documentation

## 2018-05-31 DIAGNOSIS — Z8249 Family history of ischemic heart disease and other diseases of the circulatory system: Secondary | ICD-10-CM | POA: Diagnosis not present

## 2018-05-31 DIAGNOSIS — O98212 Gonorrhea complicating pregnancy, second trimester: Secondary | ICD-10-CM | POA: Diagnosis not present

## 2018-05-31 DIAGNOSIS — O98812 Other maternal infectious and parasitic diseases complicating pregnancy, second trimester: Secondary | ICD-10-CM | POA: Diagnosis not present

## 2018-05-31 DIAGNOSIS — O3482 Maternal care for other abnormalities of pelvic organs, second trimester: Secondary | ICD-10-CM | POA: Diagnosis not present

## 2018-05-31 DIAGNOSIS — Z7982 Long term (current) use of aspirin: Secondary | ICD-10-CM | POA: Diagnosis not present

## 2018-05-31 DIAGNOSIS — Z825 Family history of asthma and other chronic lower respiratory diseases: Secondary | ICD-10-CM | POA: Diagnosis not present

## 2018-05-31 DIAGNOSIS — Z800 Family history of malignant neoplasm of digestive organs: Secondary | ICD-10-CM | POA: Diagnosis not present

## 2018-05-31 DIAGNOSIS — R110 Nausea: Secondary | ICD-10-CM | POA: Diagnosis present

## 2018-05-31 DIAGNOSIS — Z833 Family history of diabetes mellitus: Secondary | ICD-10-CM | POA: Diagnosis not present

## 2018-05-31 DIAGNOSIS — R51 Headache: Secondary | ICD-10-CM | POA: Insufficient documentation

## 2018-05-31 DIAGNOSIS — Z9089 Acquired absence of other organs: Secondary | ICD-10-CM | POA: Diagnosis not present

## 2018-05-31 DIAGNOSIS — Z3A17 17 weeks gestation of pregnancy: Secondary | ICD-10-CM

## 2018-05-31 LAB — COMPREHENSIVE METABOLIC PANEL
ALT: 37 U/L (ref 0–44)
AST: 24 U/L (ref 15–41)
Albumin: 3.2 g/dL — ABNORMAL LOW (ref 3.5–5.0)
Alkaline Phosphatase: 73 U/L (ref 38–126)
Anion gap: 12 (ref 5–15)
BILIRUBIN TOTAL: 0.5 mg/dL (ref 0.3–1.2)
BUN: 9 mg/dL (ref 6–20)
CHLORIDE: 99 mmol/L (ref 98–111)
CO2: 22 mmol/L (ref 22–32)
CREATININE: 0.51 mg/dL (ref 0.44–1.00)
Calcium: 9.6 mg/dL (ref 8.9–10.3)
Glucose, Bld: 75 mg/dL (ref 70–99)
Potassium: 4 mmol/L (ref 3.5–5.1)
Sodium: 133 mmol/L — ABNORMAL LOW (ref 135–145)
TOTAL PROTEIN: 7.1 g/dL (ref 6.5–8.1)

## 2018-05-31 LAB — URINALYSIS, ROUTINE W REFLEX MICROSCOPIC
BILIRUBIN URINE: NEGATIVE
Glucose, UA: NEGATIVE mg/dL
HGB URINE DIPSTICK: NEGATIVE
Ketones, ur: NEGATIVE mg/dL
NITRITE: NEGATIVE
PH: 8 (ref 5.0–8.0)
Protein, ur: NEGATIVE mg/dL
Specific Gravity, Urine: 1.015 (ref 1.005–1.030)

## 2018-05-31 LAB — CBC
HEMATOCRIT: 35.2 % — AB (ref 36.0–46.0)
Hemoglobin: 11.8 g/dL — ABNORMAL LOW (ref 12.0–15.0)
MCH: 32.5 pg (ref 26.0–34.0)
MCHC: 33.5 g/dL (ref 30.0–36.0)
MCV: 97 fL (ref 78.0–100.0)
PLATELETS: 348 10*3/uL (ref 150–400)
RBC: 3.63 MIL/uL — AB (ref 3.87–5.11)
RDW: 13.3 % (ref 11.5–15.5)
WBC: 11.6 10*3/uL — ABNORMAL HIGH (ref 4.0–10.5)

## 2018-05-31 MED ORDER — AZITHROMYCIN 250 MG PO TABS
1000.0000 mg | ORAL_TABLET | Freq: Once | ORAL | Status: AC
  Administered 2018-05-31: 1000 mg via ORAL
  Filled 2018-05-31: qty 4

## 2018-05-31 MED ORDER — SCOPOLAMINE 1 MG/3DAYS TD PT72
1.0000 | MEDICATED_PATCH | TRANSDERMAL | Status: DC
  Administered 2018-05-31: 1.5 mg via TRANSDERMAL
  Filled 2018-05-31: qty 1

## 2018-05-31 MED ORDER — CEFTRIAXONE SODIUM 250 MG IJ SOLR
250.0000 mg | Freq: Once | INTRAMUSCULAR | Status: AC
  Administered 2018-05-31: 250 mg via INTRAMUSCULAR
  Filled 2018-05-31: qty 250

## 2018-05-31 MED ORDER — SCOPOLAMINE 1 MG/3DAYS TD PT72: 1.0000 | MEDICATED_PATCH | TRANSDERMAL | 12 refills | Status: DC

## 2018-05-31 MED ORDER — METOCLOPRAMIDE HCL 10 MG PO TABS
10.0000 mg | ORAL_TABLET | Freq: Four times a day (QID) | ORAL | Status: DC | PRN
  Administered 2018-05-31: 10 mg via ORAL
  Filled 2018-05-31: qty 1

## 2018-05-31 MED ORDER — FAMOTIDINE IN NACL 20-0.9 MG/50ML-% IV SOLN
20.0000 mg | Freq: Once | INTRAVENOUS | Status: AC
  Administered 2018-05-31: 20 mg via INTRAVENOUS
  Filled 2018-05-31: qty 50

## 2018-05-31 MED ORDER — METOCLOPRAMIDE HCL 10 MG PO TABS: 10.0000 mg | ORAL_TABLET | Freq: Four times a day (QID) | ORAL | 0 refills | Status: DC | PRN

## 2018-05-31 MED ORDER — LACTATED RINGERS IV BOLUS
1000.0000 mL | Freq: Once | INTRAVENOUS | Status: AC
  Administered 2018-05-31: 1000 mL via INTRAVENOUS

## 2018-05-31 MED ORDER — SODIUM CHLORIDE 0.9 % IV SOLN
8.0000 mg | Freq: Once | INTRAVENOUS | Status: AC
  Administered 2018-05-31: 8 mg via INTRAVENOUS
  Filled 2018-05-31: qty 4

## 2018-05-31 NOTE — MAU Provider Note (Signed)
History     CSN: [id number]  Arrival date and time: 05/31/18 1204   First Provider Initiated Contact with Patient 05/31/18 1256      Chief Complaint  Patient presents with  . Nausea   G6P5005 @17 .0 wks didi twins here with worsening N/V. Sx have worsened over the last 2 week. She is using Zofran and Phenergan. States Zofran helps some. She is able to tolerate water and rice. Loss of appetite. Down 7 lbs in 2 weeks. Reports +GC and CT but has not been treated yet. Has recent new sexual partner.   OB History    Gravida  6   Para  5   Term  5   Preterm  0   AB  0   Living  5     SAB  0   TAB  0   Ectopic  0   Multiple  0   Live Births  5           Past Medical History:  Diagnosis Date  . Abnormal Pap smear    f/u was normal  . Chronic hypertension   . Chronic hypertension   . Depression    doind good now  . DEPRESSION, MAJOR, RECURRENT, MODERATE 04/18/2010   Qualifier: Diagnosis of  By: Georgina Snell MD, Ellard Artis    . Headache(784.0)   . History of abnormal Pap smear 07/28/2012  . Infection    trich, chlamydia, gonorrhea  . Ovarian cyst   . Urinary tract infection     Past Surgical History:  Procedure Laterality Date  . CESAREAN SECTION    . implanon removal 08/2012  08/2012  . TONSILLECTOMY    . WISDOM TOOTH EXTRACTION      Family History  Problem Relation Age of Onset  . Hypertension Mother   . Asthma Brother   . Cancer Maternal Uncle        lung  . Diabetes Maternal Grandmother   . Cancer Maternal Grandmother        thyroid  . Hypertension Maternal Grandmother   . Other Neg Hx     Social History   Tobacco Use  . Smoking status: Never Smoker  . Smokeless tobacco: Never Used  Substance Use Topics  . Alcohol use: Not Currently    Alcohol/week: 1.2 oz    Types: 1 Glasses of wine, 1 Shots of liquor per week    Comment: Not currently  . Drug use: Not Currently    Types: Marijuana    Comment: Not currently    Allergies: No Known  Allergies  Medications Prior to Admission  Medication Sig Dispense Refill Last Dose  . aspirin EC 81 MG tablet Take 1 tablet (81 mg total) by mouth daily. Take after 12 weeks for prevention of preeclampsia later in pregnancy 300 tablet 2 Past Week at Unknown time  . Butalbital-APAP-Caffeine 50-325-40 MG capsule Take 1 capsule by mouth every 6 (six) hours as needed for headache. 30 capsule 0 05/30/2018 at Unknown time  . ondansetron (ZOFRAN ODT) 4 MG disintegrating tablet Take 1 tablet (4 mg total) by mouth every 8 (eight) hours as needed for nausea or vomiting. 15 tablet 1 05/30/2018 at Unknown time  . Prenatal Vit-Fe Fumarate-FA (PRENATAL PLUS/IRON) 27-1 MG TABS Take 1 tablet by mouth daily. 90 each 6 05/30/2018 at Unknown time  . promethazine (PHENERGAN) 25 MG tablet Take 1 tablet (25 mg total) by mouth every 6 (six) hours as needed for nausea or vomiting. 30 tablet 1 05/30/2018  at Unknown time    Review of Systems  Constitutional: Negative for chills and fever.  Gastrointestinal: Positive for constipation and nausea. Negative for diarrhea.  Genitourinary: Negative for vaginal bleeding.   Physical Exam   Blood pressure 113/60, temperature 98.6 F (37 C), height 5\' 2"  (1.575 m), weight 247 lb (112 kg), last menstrual period 02/01/2018, unknown if currently breastfeeding.  Physical Exam  Nursing note and vitals reviewed. Constitutional: She is oriented to person, place, and time. She appears well-developed and well-nourished. No distress.  HENT:  Head: Normocephalic and atraumatic.  Neck: Normal range of motion.  Respiratory: Effort normal. No respiratory distress.  Musculoskeletal: Normal range of motion.  Neurological: She is alert and oriented to person, place, and time.  Skin: Skin is warm and dry.  Psychiatric: She has a normal mood and affect.  FHT A 160 FHT B 166  Results for orders placed or performed during the hospital encounter of 05/31/18 (from the past 24 hour(s))   Urinalysis, Routine w reflex microscopic     Status: Abnormal   Collection Time: 05/31/18  1:00 PM  Result Value Ref Range   Color, Urine YELLOW YELLOW   APPearance HAZY (A) CLEAR   Specific Gravity, Urine 1.015 1.005 - 1.030   pH 8.0 5.0 - 8.0   Glucose, UA NEGATIVE NEGATIVE mg/dL   Hgb urine dipstick NEGATIVE NEGATIVE   Bilirubin Urine NEGATIVE NEGATIVE   Ketones, ur NEGATIVE NEGATIVE mg/dL   Protein, ur NEGATIVE NEGATIVE mg/dL   Nitrite NEGATIVE NEGATIVE   Leukocytes, UA TRACE (A) NEGATIVE   RBC / HPF 0-5 0 - 5 RBC/hpf   WBC, UA 11-20 0 - 5 WBC/hpf   Bacteria, UA RARE (A) NONE SEEN   Squamous Epithelial / LPF 6-10 0 - 5   Mucus PRESENT   CBC     Status: Abnormal   Collection Time: 05/31/18  2:29 PM  Result Value Ref Range   WBC 11.6 (H) 4.0 - 10.5 K/uL   RBC 3.63 (L) 3.87 - 5.11 MIL/uL   Hemoglobin 11.8 (L) 12.0 - 15.0 g/dL   HCT 35.2 (L) 36.0 - 46.0 %   MCV 97.0 78.0 - 100.0 fL   MCH 32.5 26.0 - 34.0 pg   MCHC 33.5 30.0 - 36.0 g/dL   RDW 13.3 11.5 - 15.5 %   Platelets 348 150 - 400 K/uL  Comprehensive metabolic panel     Status: Abnormal   Collection Time: 05/31/18  2:29 PM  Result Value Ref Range   Sodium 133 (L) 135 - 145 mmol/L   Potassium 4.0 3.5 - 5.1 mmol/L   Chloride 99 98 - 111 mmol/L   CO2 22 22 - 32 mmol/L   Glucose, Bld 75 70 - 99 mg/dL   BUN 9 6 - 20 mg/dL   Creatinine, Ser 0.51 0.44 - 1.00 mg/dL   Calcium 9.6 8.9 - 10.3 mg/dL   Total Protein 7.1 6.5 - 8.1 g/dL   Albumin 3.2 (L) 3.5 - 5.0 g/dL   AST 24 15 - 41 U/L   ALT 37 0 - 44 U/L   Alkaline Phosphatase 73 38 - 126 U/L   Total Bilirubin 0.5 0.3 - 1.2 mg/dL   GFR calc non Af Amer >60 >60 mL/min   GFR calc Af Amer >60 >60 mL/min   Anion gap 12 5 - 15   MAU Course  Procedures LR Reglan  Zofran Azithromycin Rocephin  MDM Labs ordered and reviewed. Emesis x1. Tolerating po after Zofran.  Treated for GC and CT, partner needs treatment with PCP or [facility], abstain for 3 weeks after both have been  treated. Stable for discharge home.   Assessment and Plan   1. [redacted] weeks gestation of pregnancy   2. Hyperemesis gravidarum   3. Chlamydia infection affecting pregnancy in second trimester   4. Maternal gonorrhea in second trimester    Discharge home Follow up in OB office as scheduled Rx Reglan Rx Scop patch  Allergies as of 05/31/2018   No Known Allergies     Medication List    TAKE these medications   aspirin EC 81 MG tablet Take 1 tablet (81 mg total) by mouth daily. Take after 12 weeks for prevention of preeclampsia later in pregnancy   Butalbital-APAP-Caffeine 50-325-40 MG capsule Take 1 capsule by mouth every 6 (six) hours as needed for headache.   metoCLOPramide 10 MG tablet Commonly known as:  REGLAN Take 1 tablet (10 mg total) by mouth every 6 (six) hours as needed for nausea.   ondansetron 4 MG disintegrating tablet Commonly known as:  ZOFRAN ODT Take 1 tablet (4 mg total) by mouth every 8 (eight) hours as needed for nausea or vomiting.   PRENATAL PLUS/IRON 27-1 MG Tabs Take 1 tablet by mouth daily.   promethazine 25 MG tablet Commonly known as:  PHENERGAN Take 1 tablet (25 mg total) by mouth every 6 (six) hours as needed for nausea or vomiting.   scopolamine 1 MG/3DAYS Commonly known as:  TRANSDERM-SCOP Place 1 patch (1.5 mg total) onto the skin every 3 (three) days. Start taking on:  06/03/2018       Julianne Handler, CNM 05/31/2018, 5:31 PM

## 2018-05-31 NOTE — MAU Note (Signed)
Pt reports she is still having nausea and vomiting. Stated she almost pass out at work the other day. Tested positive for Gc/Chlamidia per my chart but they have not called her.

## 2018-05-31 NOTE — Discharge Instructions (Signed)

## 2018-06-01 ENCOUNTER — Other Ambulatory Visit: Payer: Medicaid Other

## 2018-06-01 ENCOUNTER — Emergency Department (HOSPITAL_COMMUNITY)
Admission: EM | Admit: 2018-06-01 | Discharge: 2018-06-02 | Disposition: A | Discharge status: Home / Self Care | Payer: Medicaid Other | Attending: Emergency Medicine | Admitting: Emergency Medicine

## 2018-06-01 ENCOUNTER — Other Ambulatory Visit: Payer: Self-pay

## 2018-06-01 ENCOUNTER — Encounter (HOSPITAL_COMMUNITY): Payer: Self-pay

## 2018-06-01 DIAGNOSIS — I10 Essential (primary) hypertension: Secondary | ICD-10-CM | POA: Diagnosis not present

## 2018-06-01 DIAGNOSIS — Z7982 Long term (current) use of aspirin: Secondary | ICD-10-CM | POA: Diagnosis not present

## 2018-06-01 DIAGNOSIS — R319 Hematuria, unspecified: Secondary | ICD-10-CM | POA: Insufficient documentation

## 2018-06-01 DIAGNOSIS — R55 Syncope and collapse: Secondary | ICD-10-CM | POA: Diagnosis present

## 2018-06-01 DIAGNOSIS — O2342 Unspecified infection of urinary tract in pregnancy, second trimester: Secondary | ICD-10-CM | POA: Diagnosis not present

## 2018-06-01 DIAGNOSIS — O30002 Twin pregnancy, unspecified number of placenta and unspecified number of amniotic sacs, second trimester: Secondary | ICD-10-CM | POA: Diagnosis not present

## 2018-06-01 DIAGNOSIS — O0992 Supervision of high risk pregnancy, unspecified, second trimester: Secondary | ICD-10-CM

## 2018-06-01 DIAGNOSIS — Z79899 Other long term (current) drug therapy: Secondary | ICD-10-CM | POA: Insufficient documentation

## 2018-06-01 DIAGNOSIS — Z3A18 18 weeks gestation of pregnancy: Secondary | ICD-10-CM | POA: Insufficient documentation

## 2018-06-01 DIAGNOSIS — N390 Urinary tract infection, site not specified: Secondary | ICD-10-CM

## 2018-06-01 LAB — URINALYSIS, ROUTINE W REFLEX MICROSCOPIC
BILIRUBIN URINE: NEGATIVE
Glucose, UA: NEGATIVE mg/dL
Hgb urine dipstick: NEGATIVE
KETONES UR: 20 mg/dL — AB
Nitrite: NEGATIVE
PROTEIN: NEGATIVE mg/dL
SPECIFIC GRAVITY, URINE: 1.014 (ref 1.005–1.030)
pH: 6 (ref 5.0–8.0)

## 2018-06-01 LAB — COMPREHENSIVE METABOLIC PANEL
ALBUMIN: 3 g/dL — AB (ref 3.5–5.0)
ALK PHOS: 65 U/L (ref 38–126)
ALT: 42 U/L (ref 0–44)
AST: 35 U/L (ref 15–41)
Anion gap: 10 (ref 5–15)
BILIRUBIN TOTAL: 0.3 mg/dL (ref 0.3–1.2)
BUN: 8 mg/dL (ref 6–20)
CALCIUM: 9.4 mg/dL (ref 8.9–10.3)
CO2: 23 mmol/L (ref 22–32)
CREATININE: 0.78 mg/dL (ref 0.44–1.00)
Chloride: 102 mmol/L (ref 98–111)
GFR calc Af Amer: >60 mL/min (ref >60)
GFR calc non Af Amer: >60 mL/min (ref >60)
GLUCOSE: 78 mg/dL (ref 70–99)
Potassium: 3.5 mmol/L (ref 3.5–5.1)
SODIUM: 135 mmol/L (ref 135–145)
TOTAL PROTEIN: 7 g/dL (ref 6.5–8.1)

## 2018-06-01 LAB — CBC
HEMATOCRIT: 33.7 % — AB (ref 36.0–46.0)
HEMOGLOBIN: 10.5 g/dL — AB (ref 12.0–15.0)
MCH: 31.2 pg (ref 26.0–34.0)
MCHC: 31.2 g/dL (ref 30.0–36.0)
MCV: 100 fL (ref 78.0–100.0)
Platelets: 307 10*3/uL (ref 150–400)
RBC: 3.37 MIL/uL — AB (ref 3.87–5.11)
RDW: 12.8 % (ref 11.5–15.5)
WBC: 10.1 10*3/uL (ref 4.0–10.5)

## 2018-06-01 MED ORDER — SODIUM CHLORIDE 0.9 % IV SOLN
1.0000 g | Freq: Once | INTRAVENOUS | Status: AC
  Administered 2018-06-01: 1 g via INTRAVENOUS
  Filled 2018-06-01: qty 10

## 2018-06-01 MED ORDER — SODIUM CHLORIDE 0.9 % IV SOLN: 1000.0000 mL | INTRAVENOUS | Status: DC

## 2018-06-01 MED ORDER — CEPHALEXIN 500 MG PO CAPS: 500.0000 mg | ORAL_CAPSULE | Freq: Four times a day (QID) | ORAL | 0 refills | Status: DC

## 2018-06-01 MED ORDER — SODIUM CHLORIDE 0.9 % IV BOLUS (SEPSIS)
1000.0000 mL | Freq: Once | INTRAVENOUS | Status: AC
  Administered 2018-06-01: 1000 mL via INTRAVENOUS

## 2018-06-01 NOTE — ED Triage Notes (Signed)
Pt arrives to ED from work with complaints of near sycnope since this evening. EMS reports pt felt wobbly and lightheaded. Pt remained consciousness, experienced fall with right flank pain, did not hit head. Pt is [redacted] weeks pregnant with twins and has had abdominal pain and n/v intermittently for several weeks. VSS, CBG 94. Pt placed in position of comfort with bed locked and lowered, call bell in reach.

## 2018-06-01 NOTE — Discharge Instructions (Signed)
Drink plenty of fluids Take all antibiotics as prescribed Recheck with your doctor tomorrow.
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
## 2018-06-02 NOTE — ED Notes (Signed)
Discharge instructions/prescriptions reviewed with patient. Pt denied any further questions. Work note provided. Signature pad unavailable, pt verbalized understanding.

## 2018-06-04 ENCOUNTER — Ambulatory Visit: Payer: Medicaid Other

## 2018-06-04 ENCOUNTER — Other Ambulatory Visit: Payer: Medicaid Other

## 2018-06-05 ENCOUNTER — Other Ambulatory Visit: Payer: Self-pay

## 2018-06-05 ENCOUNTER — Encounter (HOSPITAL_COMMUNITY): Payer: Self-pay

## 2018-06-05 ENCOUNTER — Inpatient Hospital Stay (HOSPITAL_COMMUNITY)
Admission: AD | Admit: 2018-06-05 | Discharge: 2018-06-05 | Disposition: A | Discharge status: Home / Self Care | Payer: Medicaid Other | Source: Ambulatory Visit | Attending: Obstetrics and Gynecology | Admitting: Obstetrics and Gynecology

## 2018-06-05 DIAGNOSIS — R102 Pelvic and perineal pain: Secondary | ICD-10-CM | POA: Insufficient documentation

## 2018-06-05 DIAGNOSIS — Z3A17 17 weeks gestation of pregnancy: Secondary | ICD-10-CM | POA: Insufficient documentation

## 2018-06-05 DIAGNOSIS — N949 Unspecified condition associated with female genital organs and menstrual cycle: Secondary | ICD-10-CM | POA: Diagnosis not present

## 2018-06-05 DIAGNOSIS — O26892 Other specified pregnancy related conditions, second trimester: Secondary | ICD-10-CM | POA: Insufficient documentation

## 2018-06-05 DIAGNOSIS — R1030 Lower abdominal pain, unspecified: Secondary | ICD-10-CM | POA: Diagnosis present

## 2018-06-05 LAB — URINALYSIS, ROUTINE W REFLEX MICROSCOPIC
BILIRUBIN URINE: NEGATIVE
Bacteria, UA: NONE SEEN
Glucose, UA: NEGATIVE mg/dL
Hgb urine dipstick: NEGATIVE
Ketones, ur: NEGATIVE mg/dL
Nitrite: NEGATIVE
PROTEIN: NEGATIVE mg/dL
Specific Gravity, Urine: 1.02 (ref 1.005–1.030)
pH: 7 (ref 5.0–8.0)

## 2018-06-05 MED ORDER — CYCLOBENZAPRINE HCL 10 MG PO TABS: 10.0000 mg | ORAL_TABLET | Freq: Three times a day (TID) | ORAL | 0 refills | Status: DC | PRN

## 2018-06-05 NOTE — MAU Note (Signed)
Kerrie Timm is a 32 y.o. at [redacted]w[redacted]d here in MAU reporting:  +lower abdominal pain Burning in nature Ongoing for the past week Pain score: 8/10  +numbess in right arm and leg at times Vitals:   06/05/18 1406  BP: 125/73  Pulse: 99  Resp: 16  Temp: 98.1 F (36.7 C)  SpO2: 100%     Lab orders placed from triage: ua

## 2018-06-05 NOTE — MAU Provider Note (Signed)
History   G6P5005 @ 17.5 wks in with pulling type bilaterial low abd pain for over a week. States is a burning pain. Denies vag bleeding or ROM.   CSN: [id number]  Arrival date & time 06/05/18  1334   None     Chief Complaint  Patient presents with  . Abdominal Pain    HPI  Past Medical History:  Diagnosis Date  . Abnormal Pap smear    f/u was normal  . Chronic hypertension   . Chronic hypertension   . Depression    doind good now  . DEPRESSION, MAJOR, RECURRENT, MODERATE 04/18/2010   Qualifier: Diagnosis of  By: Georgina Snell MD, Ellard Artis    . Headache(784.0)   . History of abnormal Pap smear 07/28/2012  . Infection    trich, chlamydia, gonorrhea  . Ovarian cyst   . Urinary tract infection     Past Surgical History:  Procedure Laterality Date  . CESAREAN SECTION    . implanon removal 08/2012  08/2012  . TONSILLECTOMY    . WISDOM TOOTH EXTRACTION      Family History  Problem Relation Age of Onset  . Hypertension Mother   . Asthma Brother   . Cancer Maternal Uncle        lung  . Diabetes Maternal Grandmother   . Cancer Maternal Grandmother        thyroid  . Hypertension Maternal Grandmother   . Other Neg Hx     Social History   Tobacco Use  . Smoking status: Never Smoker  . Smokeless tobacco: Never Used  Substance Use Topics  . Alcohol use: Not Currently    Alcohol/week: 1.2 oz    Types: 1 Glasses of wine, 1 Shots of liquor per week    Comment: Not currently  . Drug use: Not Currently    Types: Marijuana    Comment: Not currently    OB History    Gravida  6   Para  5   Term  5   Preterm  0   AB  0   Living  5     SAB  0   TAB  0   Ectopic  0   Multiple  0   Live Births  5           Review of Systems  Constitutional: Negative.   HENT: Negative.   Eyes: Negative.   Respiratory: Negative.   Cardiovascular: Negative.   Gastrointestinal: Positive for abdominal pain.  Endocrine: Negative.   Genitourinary: Negative.   Musculoskeletal:  Negative.   Skin: Negative.   Allergic/Immunologic: Negative.   Neurological: Negative.   Hematological: Negative.     Allergies  Patient has no known allergies.  Home Medications   Current Outpatient Rx  . Order #: [id number] Class: Normal    BP 125/73 (BP Location: Right Arm)   Pulse 99   Temp 98.1 F (36.7 C) (Oral)   Resp 16   Wt 259 lb 0.6 oz (117.5 kg)   LMP 02/01/2018   SpO2 100% Comment: ra  BMI 47.38 kg/m   Physical Exam  Constitutional: She is oriented to person, place, and time. She appears well-developed and well-nourished.  HENT:  Head: Normocephalic.  Eyes: Pupils are equal, round, and reactive to light.  Cardiovascular: Normal rate, regular rhythm, normal heart sounds and intact distal pulses.  Pulmonary/Chest: Effort normal and breath sounds normal.  Abdominal: Soft. Normal appearance, normal aorta and bowel sounds are normal.  Genitourinary: Vagina normal and  uterus normal.  Neurological: She is alert and oriented to person, place, and time.  Skin: Skin is warm and dry.  Psychiatric: She has a normal mood and affect. Her behavior is normal.    MAU Course  Procedures (including critical care time)  Labs Reviewed  URINALYSIS, ROUTINE W REFLEX MICROSCOPIC - Abnormal; Notable for the following components:      Result Value   APPearance HAZY (*)    Leukocytes, UA SMALL (*)    All other components within normal limits   No results found.   1. Round ligament pain       MDM  Round ligament pain suggested support band. Will also give rx for flexeril and d/c home.

## 2018-06-05 NOTE — Discharge Instructions (Signed)

## 2018-06-09 ENCOUNTER — Encounter (HOSPITAL_COMMUNITY): Payer: Self-pay

## 2018-06-10 ENCOUNTER — Ambulatory Visit (INDEPENDENT_AMBULATORY_CARE_PROVIDER_SITE_OTHER): Payer: Medicaid Other | Admitting: Obstetrics and Gynecology

## 2018-06-10 VITALS — BP 121/69 | HR 105 | Wt 255.0 lb

## 2018-06-10 DIAGNOSIS — O26892 Other specified pregnancy related conditions, second trimester: Secondary | ICD-10-CM

## 2018-06-10 DIAGNOSIS — O98319 Other infections with a predominantly sexual mode of transmission complicating pregnancy, unspecified trimester: Secondary | ICD-10-CM

## 2018-06-10 DIAGNOSIS — O99210 Obesity complicating pregnancy, unspecified trimester: Secondary | ICD-10-CM

## 2018-06-10 DIAGNOSIS — A64 Unspecified sexually transmitted disease: Secondary | ICD-10-CM

## 2018-06-10 DIAGNOSIS — O30042 Twin pregnancy, dichorionic/diamniotic, second trimester: Secondary | ICD-10-CM

## 2018-06-10 DIAGNOSIS — Z6791 Unspecified blood type, Rh negative: Secondary | ICD-10-CM

## 2018-06-10 DIAGNOSIS — O1212 Gestational proteinuria, second trimester: Secondary | ICD-10-CM

## 2018-06-10 DIAGNOSIS — O0992 Supervision of high risk pregnancy, unspecified, second trimester: Secondary | ICD-10-CM

## 2018-06-10 DIAGNOSIS — A609 Anogenital herpesviral infection, unspecified: Secondary | ICD-10-CM

## 2018-06-10 DIAGNOSIS — Z6841 Body Mass Index (BMI) 40.0 and over, adult: Secondary | ICD-10-CM

## 2018-06-10 DIAGNOSIS — Z98891 History of uterine scar from previous surgery: Secondary | ICD-10-CM

## 2018-06-10 DIAGNOSIS — I10 Essential (primary) hypertension: Secondary | ICD-10-CM

## 2018-06-10 MED ORDER — FLUCONAZOLE 150 MG PO TABS: 150.0000 mg | ORAL_TABLET | Freq: Once | ORAL | 0 refills | Status: AC
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
## 2018-06-11 LAB — T4, FREE: FREE T4: 1.05 ng/dL (ref 0.82–1.77)

## 2018-06-12 LAB — URINE CULTURE, OB REFLEX

## 2018-06-12 LAB — CULTURE, OB URINE

## 2018-06-16 ENCOUNTER — Encounter (HOSPITAL_COMMUNITY): Payer: Self-pay

## 2018-06-16 ENCOUNTER — Ambulatory Visit (HOSPITAL_COMMUNITY)
Admission: RE | Admit: 2018-06-16 | Discharge: 2018-06-16 | Disposition: A | Discharge status: Home / Self Care | Payer: Medicaid Other | Source: Ambulatory Visit | Attending: Obstetrics and Gynecology | Admitting: Obstetrics and Gynecology

## 2018-06-16 DIAGNOSIS — O09892 Supervision of other high risk pregnancies, second trimester: Secondary | ICD-10-CM | POA: Insufficient documentation

## 2018-06-16 DIAGNOSIS — E669 Obesity, unspecified: Secondary | ICD-10-CM | POA: Diagnosis not present

## 2018-06-16 DIAGNOSIS — O30042 Twin pregnancy, dichorionic/diamniotic, second trimester: Secondary | ICD-10-CM | POA: Diagnosis present

## 2018-06-16 DIAGNOSIS — Z3A19 19 weeks gestation of pregnancy: Secondary | ICD-10-CM | POA: Diagnosis not present

## 2018-06-16 DIAGNOSIS — O360120 Maternal care for anti-D [Rh] antibodies, second trimester, not applicable or unspecified: Secondary | ICD-10-CM | POA: Insufficient documentation

## 2018-06-16 DIAGNOSIS — O0991 Supervision of high risk pregnancy, unspecified, first trimester: Secondary | ICD-10-CM

## 2018-06-16 DIAGNOSIS — O99212 Obesity complicating pregnancy, second trimester: Secondary | ICD-10-CM | POA: Diagnosis not present

## 2018-06-16 DIAGNOSIS — O10012 Pre-existing essential hypertension complicating pregnancy, second trimester: Secondary | ICD-10-CM | POA: Insufficient documentation

## 2018-06-16 DIAGNOSIS — O30043 Twin pregnancy, dichorionic/diamniotic, third trimester: Secondary | ICD-10-CM

## 2018-06-16 DIAGNOSIS — O34219 Maternal care for unspecified type scar from previous cesarean delivery: Secondary | ICD-10-CM

## 2018-06-16 DIAGNOSIS — O0992 Supervision of high risk pregnancy, unspecified, second trimester: Secondary | ICD-10-CM | POA: Diagnosis present

## 2018-06-17 ENCOUNTER — Other Ambulatory Visit (HOSPITAL_COMMUNITY): Payer: Self-pay | Admitting: *Deleted

## 2018-06-17 DIAGNOSIS — O30042 Twin pregnancy, dichorionic/diamniotic, second trimester: Secondary | ICD-10-CM

## 2018-06-29 ENCOUNTER — Telehealth (HOSPITAL_COMMUNITY): Payer: Self-pay | Admitting: *Deleted

## 2018-06-30 ENCOUNTER — Telehealth (HOSPITAL_COMMUNITY): Payer: Self-pay | Admitting: *Deleted

## 2018-06-30 NOTE — Telephone Encounter (Signed)
Pt returned call to RN.  Pt name and DOB verified.  Informed pt of Panorama coming back with no results.  Pt would like to have Panorama redrawn at her next MFM appt 07/14/18. Questions answered.

## 2018-07-01 ENCOUNTER — Other Ambulatory Visit (HOSPITAL_COMMUNITY): Payer: Self-pay

## 2018-07-02 ENCOUNTER — Encounter (HOSPITAL_COMMUNITY): Payer: Self-pay | Admitting: *Deleted

## 2018-07-02 ENCOUNTER — Inpatient Hospital Stay (HOSPITAL_COMMUNITY)
Admission: AD | Admit: 2018-07-02 | Discharge: 2018-07-02 | Disposition: A | Discharge status: Home / Self Care | Payer: Medicaid Other | Source: Ambulatory Visit | Attending: Obstetrics & Gynecology | Admitting: Obstetrics & Gynecology

## 2018-07-02 ENCOUNTER — Other Ambulatory Visit: Payer: Self-pay

## 2018-07-02 DIAGNOSIS — Z7982 Long term (current) use of aspirin: Secondary | ICD-10-CM | POA: Diagnosis not present

## 2018-07-02 DIAGNOSIS — Z3A21 21 weeks gestation of pregnancy: Secondary | ICD-10-CM | POA: Insufficient documentation

## 2018-07-02 DIAGNOSIS — Z79899 Other long term (current) drug therapy: Secondary | ICD-10-CM | POA: Diagnosis not present

## 2018-07-02 DIAGNOSIS — O99282 Endocrine, nutritional and metabolic diseases complicating pregnancy, second trimester: Secondary | ICD-10-CM | POA: Diagnosis not present

## 2018-07-02 DIAGNOSIS — O99280 Endocrine, nutritional and metabolic diseases complicating pregnancy, unspecified trimester: Secondary | ICD-10-CM

## 2018-07-02 DIAGNOSIS — O26892 Other specified pregnancy related conditions, second trimester: Secondary | ICD-10-CM

## 2018-07-02 DIAGNOSIS — O210 Mild hyperemesis gravidarum: Secondary | ICD-10-CM

## 2018-07-02 DIAGNOSIS — E860 Dehydration: Secondary | ICD-10-CM | POA: Diagnosis present

## 2018-07-02 DIAGNOSIS — Z8249 Family history of ischemic heart disease and other diseases of the circulatory system: Secondary | ICD-10-CM | POA: Insufficient documentation

## 2018-07-02 DIAGNOSIS — O26899 Other specified pregnancy related conditions, unspecified trimester: Secondary | ICD-10-CM

## 2018-07-02 DIAGNOSIS — O0992 Supervision of high risk pregnancy, unspecified, second trimester: Secondary | ICD-10-CM

## 2018-07-02 LAB — URINALYSIS, ROUTINE W REFLEX MICROSCOPIC
BILIRUBIN URINE: NEGATIVE
Glucose, UA: NEGATIVE mg/dL
Hgb urine dipstick: NEGATIVE
KETONES UR: 80 mg/dL — AB
LEUKOCYTES UA: NEGATIVE
Nitrite: NEGATIVE
PROTEIN: 30 mg/dL — AB
Specific Gravity, Urine: 1.018 (ref 1.005–1.030)
pH: 6 (ref 5.0–8.0)

## 2018-07-02 LAB — CBC WITH DIFFERENTIAL/PLATELET
BASOS ABS: 0 10*3/uL (ref 0.0–0.1)
BASOS PCT: 0 %
EOS ABS: 0.1 10*3/uL (ref 0.0–0.7)
Eosinophils Relative: 1 %
HCT: 30.2 % — ABNORMAL LOW (ref 36.0–46.0)
HEMOGLOBIN: 10.1 g/dL — AB (ref 12.0–15.0)
Lymphocytes Relative: 15 %
Lymphs Abs: 1.8 10*3/uL (ref 0.7–4.0)
MCH: 31.8 pg (ref 26.0–34.0)
MCHC: 33.4 g/dL (ref 30.0–36.0)
MCV: 95 fL (ref 78.0–100.0)
MONOS PCT: 3 %
Monocytes Absolute: 0.4 10*3/uL (ref 0.1–1.0)
NEUTROS ABS: 9.6 10*3/uL — AB (ref 1.7–7.7)
NEUTROS PCT: 81 %
Platelets: 375 10*3/uL (ref 150–400)
RBC: 3.18 MIL/uL — ABNORMAL LOW (ref 3.87–5.11)
RDW: 13.4 % (ref 11.5–15.5)
WBC: 11.9 10*3/uL — AB (ref 4.0–10.5)

## 2018-07-02 LAB — COMPREHENSIVE METABOLIC PANEL
ALBUMIN: 3 g/dL — AB (ref 3.5–5.0)
ALK PHOS: 75 U/L (ref 38–126)
ALT: 16 U/L (ref 0–44)
ANION GAP: 12 (ref 5–15)
AST: 18 U/L (ref 15–41)
BUN: 10 mg/dL (ref 6–20)
CALCIUM: 9.4 mg/dL (ref 8.9–10.3)
CO2: 18 mmol/L — AB (ref 22–32)
Chloride: 101 mmol/L (ref 98–111)
Creatinine, Ser: 0.64 mg/dL (ref 0.44–1.00)
GFR calc Af Amer: >60 mL/min (ref >60)
GFR calc non Af Amer: >60 mL/min (ref >60)
GLUCOSE: 64 mg/dL — AB (ref 70–99)
POTASSIUM: 4.2 mmol/L (ref 3.5–5.1)
Sodium: 131 mmol/L — ABNORMAL LOW (ref 135–145)
Total Bilirubin: 0.4 mg/dL (ref 0.3–1.2)
Total Protein: 6.8 g/dL (ref 6.5–8.1)

## 2018-07-02 LAB — GLUCOSE, CAPILLARY: Glucose-Capillary: 67 mg/dL — ABNORMAL LOW (ref 70–99)

## 2018-07-02 NOTE — Progress Notes (Signed)
Bedside U/S done by H. Norman Herrlich, CNM to verify [facility].  FHR visualized on both fetuses.

## 2018-07-02 NOTE — MAU Note (Signed)
Pt to MAU via EMS with c/o abdominal pain and syncopal episode.  Denies VB or LOF.  Reports was ambulating began feeling light headed and dizzy, states began feeling faint but didn't pass out.  She lowered herself to pavement into lateral position. Reports hasn't eaten today and 1.5 bottles of water.

## 2018-07-02 NOTE — Discharge Instructions (Signed)
Eating Plan for Pregnant Women While you are pregnant, your body will require additional nutrition to help support your growing baby. It is recommended that you consume:  150 additional calories each day during your first trimester.  300 additional calories each day during your second trimester.  300 additional calories each day during your third trimester.  Eating a healthy, well-balanced diet is very important for your health and for your baby's health. You also have a higher need for some vitamins and minerals, such as folic acid, calcium, iron, and vitamin D. What do I need to know about eating during pregnancy?  Do not try to lose weight or go on a diet during pregnancy.  Choose healthy, nutritious foods. Choose  of a sandwich with a glass of milk instead of a candy bar or a high-calorie sugar-sweetened beverage.  Limit your overall intake of foods that have "empty calories." These are foods that have little nutritional value, such as sweets, desserts, candies, sugar-sweetened beverages, and fried foods.  Eat a variety of foods, especially fruits and vegetables.  Take a prenatal vitamin to help meet the additional needs during pregnancy, specifically for folic acid, iron, calcium, and vitamin D.  Remember to stay active. Ask your health care provider for exercise recommendations that are specific to you.  Practice good food safety and cleanliness, such as washing your hands before you eat and after you prepare raw meat. This helps to prevent foodborne illnesses, such as listeriosis, that can be very dangerous for your baby. Ask your health care provider for more information about listeriosis. What does 150 extra calories look like? Healthy options for an additional 150 calories each day could be any of the following:  Plain low-fat yogurt (6-8 oz) with  cup of berries.  1 apple with 2 teaspoons of peanut butter.  Cut-up vegetables with  cup of hummus.  Low-fat chocolate milk  (8 oz or 1 cup).  1 string cheese with 1 medium orange.   of a peanut butter and jelly sandwich on whole-wheat bread (1 tsp of peanut butter).  For 300 calories, you could eat two of those healthy options each day. What is a healthy amount of weight to gain? The recommended amount of weight for you to gain is based on your pre-pregnancy BMI. If your pre-pregnancy BMI was:  Less than 18 (underweight), you should gain 28-40 lb.  18-24.9 (normal), you should gain 25-35 lb.  25-29.9 (overweight), you should gain 15-25 lb.  Greater than 30 (obese), you should gain 11-20 lb.  What if I am having twins or multiples? Generally, pregnant women who will be having twins or multiples may need to increase their daily calories by 300-600 calories each day. The recommended range for total weight gain is 25-54 lb, depending on your pre-pregnancy BMI. Talk with your health care provider for specific guidance about additional nutritional needs, weight gain, and exercise during your pregnancy. What foods can I eat? Grains Any grains. Try to choose whole grains, such as whole-wheat bread, oatmeal, or brown rice. Vegetables Any vegetables. Try to eat a variety of colors and types of vegetables to get a full range of vitamins and minerals. Remember to wash your vegetables well before eating. Fruits Any fruits. Try to eat a variety of colors and types of fruit to get a full range of vitamins and minerals. Remember to wash your fruits well before eating. Meats and Other Protein Sources Lean meats, including chicken, [place], fish, and lean cuts of beef, veal,  or pork. Make sure that all meats are cooked to "well done." Tofu. Tempeh. Beans. Eggs. Peanut butter and other nut butters. Seafood, such as shrimp, crab, and lobster. If you choose fish, select types that are higher in omega-3 fatty acids, including salmon, herring, mussels, trout, sardines, and pollock. Make sure that all meats are cooked to food-safe  temperatures. Dairy Pasteurized milk and milk alternatives. Pasteurized yogurt and pasteurized cheese. Cottage cheese. Sour cream. Beverages Water. Juices that contain 100% fruit juice or vegetable juice. Caffeine-free teas and decaffeinated coffee. Drinks that contain caffeine are okay to drink, but it is better to avoid caffeine. Keep your total caffeine intake to less than 200 mg each day (12 oz of coffee, tea, or soda) or as directed by your health care provider. Condiments Any pasteurized condiments. Sweets and Desserts Any sweets and desserts. Fats and Oils Any fats and oils. The items listed above may not be a complete list of recommended foods or beverages. Contact your dietitian for more options. What foods are not recommended? Vegetables Unpasteurized (raw) vegetable juices. Fruits Unpasteurized (raw) fruit juices. Meats and Other Protein Sources Cured meats that have nitrates, such as bacon, salami, and hotdogs. Luncheon meats, bologna, or other deli meats (unless they are reheated until they are steaming hot). Refrigerated pate, meat spreads from a meat counter, smoked seafood that is found in the refrigerated section of a store. Raw fish, such as sushi or sashimi. High mercury content fish, such as tilefish, shark, swordfish, and king mackerel. Raw meats, such as tuna or beef tartare. Undercooked meats and poultry. Make sure that all meats are cooked to food-safe temperatures. Dairy Unpasteurized (raw) milk and any foods that have raw milk in them. Soft cheeses, such as feta, queso blanco, queso fresco, Brie, Camembert cheeses, blue-veined cheeses, and Panela cheese (unless it is made with pasteurized milk, which must be stated on the label). Beverages Alcohol. Sugar-sweetened beverages, such as sodas, teas, or energy drinks. Condiments Homemade fermented foods and drinks, such as pickles, sauerkraut, or kombucha drinks. (Store-bought pasteurized versions of these are  okay.) Other Salads that are made in the store, such as ham salad, chicken salad, egg salad, tuna salad, and seafood salad. The items listed above may not be a complete list of foods and beverages to avoid. Contact your dietitian for more information. This information is not intended to replace advice given to you by your health care provider. Make sure you discuss any questions you have with your health care provider. Document Released: 09/01/2014 Document Revised: 04/24/2016 Document Reviewed: 05/02/2014 Elsevier Interactive Patient Education  [employer].

## 2018-07-02 NOTE — MAU Provider Note (Addendum)
History     CSN: [id number]  Arrival date and time: 07/02/18 1705   First Provider Initiated Contact with Patient 07/02/18 1802      Chief Complaint  Patient presents with  . Dizziness   [name] [name] is a 32 y.o. [id number] at [redacted]w[redacted]d who presents today with dizziness. She states that she "fell out" about 2 weeks ago, and then again today. She reports that she had not eaten all day, and she was walking to get something to eat. Then everything was bright and her ears were ringing. She sat down, and her vision was blurry. She tried to lay down and that only made it worse. Slowly it got better and now it has resolved. She is still "seeing little black dots". She never got anything to eat, and has not eaten at all today.   Dizziness  This is a new problem. The current episode started today. The problem occurs intermittently. The problem has been resolved. Pertinent negatives include no chills, fever, nausea or vomiting. The symptoms are aggravated by walking (not eating ). She has tried nothing for the symptoms.    OB History as of 04/19/2018    Gravida  6   Para  5   Term  5   Preterm  0   AB  0   Living  5     SAB  0   TAB  0   Ectopic  0   Multiple  0   Live Births  5           Past Medical History:  Diagnosis Date  . Abnormal Pap smear    f/u was normal  . Chronic hypertension   . Chronic hypertension   . Depression    doind good now  . DEPRESSION, MAJOR, RECURRENT, MODERATE 04/18/2010   Qualifier: Diagnosis of  By: Georgina Snell MD, Ellard Artis    . Headache(784.0)   . History of abnormal Pap smear 07/28/2012  . Infection    trich, chlamydia, gonorrhea  . Ovarian cyst   . Urinary tract infection     Past Surgical History:  Procedure Laterality Date  . CESAREAN SECTION    . implanon removal 08/2012  08/2012  . TONSILLECTOMY    . WISDOM TOOTH EXTRACTION      Family History  Problem Relation Age of Onset  . Hypertension Mother   . Asthma Brother   . Cancer Maternal  Uncle        lung  . Diabetes Maternal Grandmother   . Cancer Maternal Grandmother        thyroid  . Hypertension Maternal Grandmother   . Other Neg Hx     Social History   Tobacco Use  . Smoking status: Never Smoker  . Smokeless tobacco: Never Used  Substance Use Topics  . Alcohol use: Not Currently    Alcohol/week: 1.2 oz    Types: 1 Glasses of wine, 1 Shots of liquor per week    Comment: Not currently  . Drug use: Not Currently    Types: Marijuana    Comment: Not currently    Allergies: No Known Allergies  Medications Prior to Admission  Medication Sig Dispense Refill Last Dose  . aspirin EC 81 MG tablet Take 1 tablet (81 mg total) by mouth daily. Take after 12 weeks for prevention of preeclampsia later in pregnancy 300 tablet 2 Taking  . Butalbital-APAP-Caffeine 50-325-40 MG capsule Take 1 capsule by mouth every 6 (six) hours as needed for headache. [place]  capsule 0 Taking  . cephALEXin (KEFLEX) 500 MG capsule Take 1 capsule (500 mg total) by mouth 4 (four) times daily. (Patient not taking: Reported on 06/10/2018) 20 capsule 0 Not Taking  . cyclobenzaprine (FLEXERIL) 10 MG tablet Take 1 tablet (10 mg total) by mouth 3 (three) times daily as needed for muscle spasms. 60 tablet 0 Taking  . metoCLOPramide (REGLAN) 10 MG tablet Take 1 tablet (10 mg total) by mouth every 6 (six) hours as needed for nausea. 30 tablet 0 Taking  . ondansetron (ZOFRAN ODT) 4 MG disintegrating tablet Take 1 tablet (4 mg total) by mouth every 8 (eight) hours as needed for nausea or vomiting. 15 tablet 1 Taking  . Prenatal Vit-Fe Fumarate-FA (PRENATAL PLUS/IRON) 27-1 MG TABS Take 1 tablet by mouth daily. 90 each 6 Taking  . promethazine (PHENERGAN) 25 MG tablet Take 1 tablet (25 mg total) by mouth every 6 (six) hours as needed for nausea or vomiting. 30 tablet 1 Taking  . scopolamine (TRANSDERM-SCOP) 1 MG/3DAYS Place 1 patch (1.5 mg total) onto the skin every 3 (three) days. (Patient not taking: Reported on  06/10/2018) 10 patch 12 Not Taking    Review of Systems  Constitutional: Negative for chills and fever.  Eyes: Positive for visual disturbance.  Gastrointestinal: Negative for nausea and vomiting.  Neurological: Positive for dizziness.  All other systems reviewed and are negative.  Physical Exam   Blood pressure 129/75, pulse (!) 126, temperature 98.4 F (36.9 C), temperature source Oral, resp. rate 20, last menstrual period 02/01/2018, SpO2 99 %, unknown if currently breastfeeding.  Physical Exam  Nursing note and vitals reviewed. Constitutional: She is oriented to person, place, and time. She appears well-developed and well-nourished. No distress.  HENT:  Head: Normocephalic.  Cardiovascular: Normal rate.  Respiratory: Effort normal.  GI: Soft. There is no tenderness. There is no rebound.  Neurological: She is alert and oriented to person, place, and time.  Skin: Skin is warm and dry.  Psychiatric: She has a normal mood and affect.   Bedside US+FHT 160 baby A, 150 Baby B  Pt informed that the ultrasound is considered a limited OB ultrasound and is not intended to be a complete ultrasound exam.  Patient also informed that the ultrasound is not being completed with the intent of assessing for fetal or placental anomalies or any pelvic abnormalities.  Explained that the purpose of today's ultrasound is to assess for  viability.  Patient acknowledges the purpose of the exam and the limitations of the study.      Results for orders placed or performed during the hospital encounter of 07/02/18 (from the past 24 hour(s))  Glucose, capillary     Status: Abnormal   Collection Time: 07/02/18  6:17 PM  Result Value Ref Range   Glucose-Capillary 67 (L) 70 - 99 mg/dL  CBC with Differential/Platelet     Status: Abnormal   Collection Time: 07/02/18  6:18 PM  Result Value Ref Range   WBC 11.9 (H) 4.0 - 10.5 K/uL   RBC 3.18 (L) 3.87 - 5.11 MIL/uL   Hemoglobin 10.1 (L) 12.0 - 15.0 g/dL    HCT 30.2 (L) 36.0 - 46.0 %   MCV 95.0 78.0 - 100.0 fL   MCH 31.8 26.0 - 34.0 pg   MCHC 33.4 30.0 - 36.0 g/dL   RDW 13.4 11.5 - 15.5 %   Platelets 375 150 - 400 K/uL   Neutrophils Relative % 81 %   Neutro Abs 9.6 (H) 1.7 -  7.7 K/uL   Lymphocytes Relative 15 %   Lymphs Abs 1.8 0.7 - 4.0 K/uL   Monocytes Relative 3 %   Monocytes Absolute 0.4 0.1 - 1.0 K/uL   Eosinophils Relative 1 %   Eosinophils Absolute 0.1 0.0 - 0.7 K/uL   Basophils Relative 0 %   Basophils Absolute 0.0 0.0 - 0.1 K/uL  Comprehensive metabolic panel     Status: Abnormal   Collection Time: 07/02/18  6:18 PM  Result Value Ref Range   Sodium 131 (L) 135 - 145 mmol/L   Potassium 4.2 3.5 - 5.1 mmol/L   Chloride 101 98 - 111 mmol/L   CO2 18 (L) 22 - 32 mmol/L   Glucose, Bld 64 (L) 70 - 99 mg/dL   BUN 10 6 - 20 mg/dL   Creatinine, Ser 0.64 0.44 - 1.00 mg/dL   Calcium 9.4 8.9 - 10.3 mg/dL   Total Protein 6.8 6.5 - 8.1 g/dL   Albumin 3.0 (L) 3.5 - 5.0 g/dL   AST 18 15 - 41 U/L   ALT 16 0 - 44 U/L   Alkaline Phosphatase 75 38 - 126 U/L   Total Bilirubin 0.4 0.3 - 1.2 mg/dL   GFR calc non Af Amer >60 >60 mL/min   GFR calc Af Amer >60 >60 mL/min   Anion gap 12 5 - 15  Urinalysis, Routine w reflex microscopic     Status: Abnormal   Collection Time: 07/02/18  6:39 PM  Result Value Ref Range   Color, Urine YELLOW YELLOW   APPearance HAZY (A) CLEAR   Specific Gravity, Urine 1.018 1.005 - 1.030   pH 6.0 5.0 - 8.0   Glucose, UA NEGATIVE NEGATIVE mg/dL   Hgb urine dipstick NEGATIVE NEGATIVE   Bilirubin Urine NEGATIVE NEGATIVE   Ketones, ur 80 (A) NEGATIVE mg/dL   Protein, ur 30 (A) NEGATIVE mg/dL   Nitrite NEGATIVE NEGATIVE   Leukocytes, UA NEGATIVE NEGATIVE   RBC / HPF 0-5 0 - 5 RBC/hpf   WBC, UA 11-20 0 - 5 WBC/hpf   Bacteria, UA RARE (A) NONE SEEN   Squamous Epithelial / LPF 0-5 0 - 5   Mucus PRESENT     MAU Course  Procedures  MDM Patient has had PO hydration, and a dinner tray as she has not eaten  anything today. She reports that she is feeling better.   7:56 PM Care turned over to [facility], CNM Marcille Buffy 7:56 PM 07/02/18   Assessment and Plan  --Symptoms resolved after dinner tray and PO hydration --Dehydration in pregnancy r/t poor PO water intake --Elevated WBCs in UA, urine culture ordered --Discussed target daily minimum intake of 64 ounces for non-pregnant patients, double goal for multiple gestation --Patient to gain permission to carry water bottle at work --Discharge home in stable condition  F/U: [facility] 07/12/2018  Mallie Snooks, CNM 07/02/18  8:34 PM

## 2018-07-04 LAB — CULTURE, OB URINE

## 2018-07-12 ENCOUNTER — Ambulatory Visit (INDEPENDENT_AMBULATORY_CARE_PROVIDER_SITE_OTHER): Payer: Medicaid Other | Admitting: Family Medicine

## 2018-07-12 ENCOUNTER — Other Ambulatory Visit (HOSPITAL_COMMUNITY)
Admission: RE | Admit: 2018-07-12 | Discharge: 2018-07-12 | Disposition: A | Discharge status: Home / Self Care | Payer: Medicaid Other | Source: Ambulatory Visit | Attending: Family Medicine | Admitting: Family Medicine

## 2018-07-12 VITALS — BP 113/66 | HR 130 | Wt 262.0 lb

## 2018-07-12 DIAGNOSIS — O0992 Supervision of high risk pregnancy, unspecified, second trimester: Secondary | ICD-10-CM

## 2018-07-12 DIAGNOSIS — I10 Essential (primary) hypertension: Secondary | ICD-10-CM

## 2018-07-12 DIAGNOSIS — A609 Anogenital herpesviral infection, unspecified: Secondary | ICD-10-CM

## 2018-07-12 DIAGNOSIS — Z6841 Body Mass Index (BMI) 40.0 and over, adult: Secondary | ICD-10-CM

## 2018-07-12 DIAGNOSIS — A64 Unspecified sexually transmitted disease: Secondary | ICD-10-CM

## 2018-07-12 DIAGNOSIS — O26892 Other specified pregnancy related conditions, second trimester: Secondary | ICD-10-CM

## 2018-07-12 DIAGNOSIS — Z6791 Unspecified blood type, Rh negative: Secondary | ICD-10-CM

## 2018-07-12 DIAGNOSIS — O98319 Other infections with a predominantly sexual mode of transmission complicating pregnancy, unspecified trimester: Secondary | ICD-10-CM | POA: Insufficient documentation

## 2018-07-12 DIAGNOSIS — R7989 Other specified abnormal findings of blood chemistry: Secondary | ICD-10-CM

## 2018-07-12 DIAGNOSIS — O30042 Twin pregnancy, dichorionic/diamniotic, second trimester: Secondary | ICD-10-CM

## 2018-07-12 LAB — POCT URINALYSIS DIP (DEVICE)
Glucose, UA: NEGATIVE mg/dL
Glucose, UA: NEGATIVE mg/dL
Ketones, ur: NEGATIVE mg/dL
NITRITE: NEGATIVE
Nitrite: NEGATIVE
PH: 6.5 (ref 5.0–8.0)
PH: 6.5 (ref 5.0–8.0)
PROTEIN: 100 mg/dL — AB
PROTEIN: 100 mg/dL — AB
SPECIFIC GRAVITY, URINE: 1.025 (ref 1.005–1.030)
Specific Gravity, Urine: 1.025 (ref 1.005–1.030)
UROBILINOGEN UA: 0.2 mg/dL (ref 0.0–1.0)
UROBILINOGEN UA: 0.2 mg/dL (ref 0.0–1.0)
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
## 2018-07-13 ENCOUNTER — Other Ambulatory Visit: Payer: Self-pay | Admitting: Family Medicine

## 2018-07-13 ENCOUNTER — Telehealth: Payer: Self-pay

## 2018-07-13 LAB — CERVICOVAGINAL ANCILLARY ONLY
Chlamydia: POSITIVE — AB
Neisseria Gonorrhea: NEGATIVE
Trichomonas: NEGATIVE

## 2018-07-13 MED ORDER — AZITHROMYCIN 500 MG PO TABS: 1000.0000 mg | ORAL_TABLET | Freq: Once | ORAL | 1 refills | Status: AC

## 2018-07-13 NOTE — Telephone Encounter (Addendum)
-----   Message from Truett Mainland, DO sent at 07/13/2018  4:35 PM EDT ----- Patient has chlamydia - needs retreatment. I sent azithromycin to the pharmacy for her. Please let her know and also call in expedited partner treatment. No sex for 2 weeks after both are treated.  LM for pt to return call for results.

## 2018-07-14 ENCOUNTER — Encounter (HOSPITAL_COMMUNITY): Payer: Self-pay

## 2018-07-14 ENCOUNTER — Ambulatory Visit (HOSPITAL_COMMUNITY)
Admission: RE | Admit: 2018-07-14 | Discharge: 2018-07-14 | Disposition: A | Discharge status: Home / Self Care | Payer: Medicaid Other | Source: Ambulatory Visit | Attending: Obstetrics and Gynecology | Admitting: Obstetrics and Gynecology

## 2018-07-14 ENCOUNTER — Other Ambulatory Visit (HOSPITAL_COMMUNITY): Payer: Self-pay | Admitting: *Deleted

## 2018-07-14 DIAGNOSIS — O34219 Maternal care for unspecified type scar from previous cesarean delivery: Secondary | ICD-10-CM

## 2018-07-14 DIAGNOSIS — O10912 Unspecified pre-existing hypertension complicating pregnancy, second trimester: Secondary | ICD-10-CM | POA: Insufficient documentation

## 2018-07-14 DIAGNOSIS — O30042 Twin pregnancy, dichorionic/diamniotic, second trimester: Secondary | ICD-10-CM | POA: Insufficient documentation

## 2018-07-14 DIAGNOSIS — Z3A23 23 weeks gestation of pregnancy: Secondary | ICD-10-CM | POA: Insufficient documentation

## 2018-07-14 DIAGNOSIS — O99212 Obesity complicating pregnancy, second trimester: Secondary | ICD-10-CM | POA: Diagnosis not present

## 2018-07-14 DIAGNOSIS — O10012 Pre-existing essential hypertension complicating pregnancy, second trimester: Secondary | ICD-10-CM

## 2018-07-14 DIAGNOSIS — O30049 Twin pregnancy, dichorionic/diamniotic, unspecified trimester: Secondary | ICD-10-CM

## 2018-07-14 NOTE — Addendum Note (Signed)
Addended by: Diona Foley on: 07/14/2018 01:42 PM   Modules accepted: Orders

## 2018-07-14 NOTE — Telephone Encounter (Signed)
Called patient & informed her of results, medication sent to pharmacy, importance of partner treatment & abstaining from intercourse for 2 weeks following. Patient verbalized understanding to all & had no questions.

## 2018-07-15 LAB — PROTEIN, URINE, 24 HOUR
Protein, 24H Urine: 409 mg/24 hr — ABNORMAL HIGH (ref 30–150)
Protein, Ur: 21.5 mg/dL

## 2018-07-15 LAB — URINE CYTOLOGY ANCILLARY ONLY
BACTERIAL VAGINITIS: NEGATIVE
CANDIDA VAGINITIS: NEGATIVE

## 2018-07-18 ENCOUNTER — Other Ambulatory Visit: Payer: Self-pay

## 2018-07-18 ENCOUNTER — Encounter (HOSPITAL_COMMUNITY): Payer: Self-pay | Admitting: *Deleted

## 2018-07-18 ENCOUNTER — Inpatient Hospital Stay (HOSPITAL_COMMUNITY)
Admission: AD | Admit: 2018-07-18 | Discharge: 2018-07-18 | Disposition: A | Discharge status: Home / Self Care | Payer: Medicaid Other | Source: Ambulatory Visit | Attending: Obstetrics and Gynecology | Admitting: Obstetrics and Gynecology

## 2018-07-18 DIAGNOSIS — O30042 Twin pregnancy, dichorionic/diamniotic, second trimester: Secondary | ICD-10-CM | POA: Insufficient documentation

## 2018-07-18 DIAGNOSIS — O26892 Other specified pregnancy related conditions, second trimester: Secondary | ICD-10-CM | POA: Diagnosis not present

## 2018-07-18 DIAGNOSIS — R55 Syncope and collapse: Secondary | ICD-10-CM

## 2018-07-18 DIAGNOSIS — Z7982 Long term (current) use of aspirin: Secondary | ICD-10-CM | POA: Diagnosis not present

## 2018-07-18 DIAGNOSIS — Z3A23 23 weeks gestation of pregnancy: Secondary | ICD-10-CM | POA: Diagnosis not present

## 2018-07-18 DIAGNOSIS — O10012 Pre-existing essential hypertension complicating pregnancy, second trimester: Secondary | ICD-10-CM | POA: Insufficient documentation

## 2018-07-18 LAB — URINALYSIS, ROUTINE W REFLEX MICROSCOPIC
BILIRUBIN URINE: NEGATIVE
GLUCOSE, UA: NEGATIVE mg/dL
HGB URINE DIPSTICK: NEGATIVE
Ketones, ur: 20 mg/dL — AB
NITRITE: NEGATIVE
PROTEIN: 100 mg/dL — AB
Specific Gravity, Urine: 1.018 (ref 1.005–1.030)
WBC, UA: >50 WBC/hpf — ABNORMAL HIGH (ref 0–5)
pH: 6 (ref 5.0–8.0)

## 2018-07-18 MED ORDER — ONDANSETRON HCL 4 MG/2ML IJ SOLN
4.0000 mg | Freq: Once | INTRAMUSCULAR | Status: AC
  Administered 2018-07-18: 4 mg via INTRAVENOUS
  Filled 2018-07-18: qty 2

## 2018-07-18 MED ORDER — DEXTROSE 5 % IN LACTATED RINGERS IV BOLUS
1000.0000 mL | Freq: Once | INTRAVENOUS | Status: AC
  Administered 2018-07-18: 1000 mL via INTRAVENOUS

## 2018-07-18 NOTE — MAU Provider Note (Signed)
History     CSN: [id number]  Arrival date and time: 07/18/18 6160   First Provider Initiated Contact with Patient 07/18/18 1031      Chief Complaint  Patient presents with  . Loss of Consciousness    HPI [name] [name] is a 32 y.o. [id number] at [redacted]w[redacted]d who presents via EMS after an episode of syncope at work. She states she got hot and sweaty at work and woke up slumped over on the floor. It was a witnessed episode and she did not hit her head or abdomen. She states she has not eaten or drank anything since last night. She reports feeling nauseous at this time. Denies any pain, vaginal bleeding or leaking of fluid. Reports good fetal movement of both babies.   Her pregnancy is complicated by [facility] on aspirin and chlamydia. She states she took her treatment this week after she was called with the results. She denies any headache, visual changes or epigastric pain.  OB History    Gravida  6   Para  5   Term  5   Preterm  0   AB  0   Living  5     SAB  0   TAB  0   Ectopic  0   Multiple  0   Live Births  5           Past Medical History:  Diagnosis Date  . Abnormal Pap smear    f/u was normal  . Chronic hypertension   . Chronic hypertension   . Depression    doind good now  . DEPRESSION, MAJOR, RECURRENT, MODERATE 04/18/2010   Qualifier: Diagnosis of  By: Georgina Snell MD, Ellard Artis    . Headache(784.0)   . History of abnormal Pap smear 07/28/2012  . Infection    trich, chlamydia, gonorrhea  . Ovarian cyst   . Urinary tract infection     Past Surgical History:  Procedure Laterality Date  . CESAREAN SECTION    . implanon removal 08/2012  08/2012  . TONSILLECTOMY    . WISDOM TOOTH EXTRACTION      Family History  Problem Relation Age of Onset  . Hypertension Mother   . Asthma Brother   . Cancer Maternal Uncle        lung  . Diabetes Maternal Grandmother   . Cancer Maternal Grandmother        thyroid  . Hypertension Maternal Grandmother   . Other Neg Hx      Social History   Tobacco Use  . Smoking status: Never Smoker  . Smokeless tobacco: Never Used  Substance Use Topics  . Alcohol use: Not Currently    Alcohol/week: 2.0 standard drinks    Types: 1 Glasses of wine, 1 Shots of liquor per week    Comment: Not currently  . Drug use: Not Currently    Types: Marijuana    Comment: Not currently    Allergies: No Known Allergies  Medications Prior to Admission  Medication Sig Dispense Refill Last Dose  . aspirin EC 81 MG tablet Take 1 tablet (81 mg total) by mouth daily. Take after 12 weeks for prevention of preeclampsia later in pregnancy 300 tablet 2 Taking  . Butalbital-APAP-Caffeine 50-325-40 MG capsule Take 1 capsule by mouth every 6 (six) hours as needed for headache. 30 capsule 0 Taking  . Prenatal Vit-Fe Fumarate-FA (PRENATAL PLUS/IRON) 27-1 MG TABS Take 1 tablet by mouth daily. 90 each 6 Taking  . promethazine (PHENERGAN) 25 MG  tablet Take 1 tablet (25 mg total) by mouth every 6 (six) hours as needed for nausea or vomiting. 30 tablet 1 Taking    Review of Systems  Constitutional: Negative.  Negative for fatigue and fever.  HENT: Negative.   Respiratory: Negative.  Negative for shortness of breath.   Cardiovascular: Negative.  Negative for chest pain.  Gastrointestinal: Positive for nausea. Negative for abdominal pain, constipation, diarrhea and vomiting.  Genitourinary: Negative.  Negative for dysuria, vaginal bleeding and vaginal discharge.  Neurological: Positive for dizziness and syncope. Negative for headaches.   Physical Exam   Blood pressure 125/67, pulse (!) 111, temperature 98.3 F (36.8 C), temperature source Oral, resp. rate 18, last menstrual period 02/01/2018, SpO2 100 %, unknown if currently breastfeeding.  Physical Exam  Nursing note and vitals reviewed. Constitutional: She is oriented to person, place, and time. She appears well-developed and well-nourished. No distress.  HENT:  Head: Normocephalic.   Eyes: Pupils are equal, round, and reactive to light.  Cardiovascular: Normal rate, regular rhythm and normal heart sounds.  Respiratory: Effort normal and breath sounds normal. No respiratory distress.  GI: Soft. Bowel sounds are normal. She exhibits no distension. There is no tenderness.  Neurological: She is alert and oriented to person, place, and time.  Skin: Skin is warm and dry.  Psychiatric: She has a normal mood and affect. Her behavior is normal. Judgment and thought content normal.   Fetal Tracing:  Baby A:  Baseline: 150 Variability: moderate Accels: 10x10 Decels: none  Baby B:  Baseline: 145 Variability: moderate Accels: none Decels: none  [place]: none  MAU Course  Procedures Results for orders placed or performed during the hospital encounter of 07/18/18 (from the past 24 hour(s))  Urinalysis, Routine w reflex microscopic     Status: Abnormal   Collection Time: 07/18/18 10:36 AM  Result Value Ref Range   Color, Urine AMBER (A) YELLOW   APPearance CLOUDY (A) CLEAR   Specific Gravity, Urine 1.018 1.005 - 1.030   pH 6.0 5.0 - 8.0   Glucose, UA NEGATIVE NEGATIVE mg/dL   Hgb urine dipstick NEGATIVE NEGATIVE   Bilirubin Urine NEGATIVE NEGATIVE   Ketones, ur 20 (A) NEGATIVE mg/dL   Protein, ur 100 (A) NEGATIVE mg/dL   Nitrite NEGATIVE NEGATIVE   Leukocytes, UA MODERATE (A) NEGATIVE   RBC / HPF 6-10 0 - 5 RBC/hpf   WBC, UA >50 (H) 0 - 5 WBC/hpf   Bacteria, UA RARE (A) NONE SEEN   Squamous Epithelial / LPF 21-50 0 - 5   Mucus PRESENT    Hyaline Casts, UA PRESENT      MDM UA D5LR bolus Zofran 4mg  IV Tray of food ordered for patient  Patient reporting feeling better after zofran, tachycardia resolving.  Intermittent FHR tracing due to patient habitus and fetal activity. Overall reassuring for gestational age. Limited bedside u/s performed- both fetal heart rates noted and fetal activity seen on both babies  Lengthy discussion with patient about  importance of adequate food intake in pregnancy. Discussed necessity to eat small frequent meals or snacks high in protein. Food choices reviewed. Also emphasized the importance of hydration in pregnancy.   Assessment and Plan   1. Syncope, unspecified syncope type   2. [redacted] weeks gestation of pregnancy   3. Dichorionic diamniotic twin pregnancy in second trimester    -Discharge home in stable condition -Preterm labor precautions discussed -Patient advised to follow-up with [facility] as scheduled for prenatal care -Patient may return to MAU  as needed or if her condition were to change or worsen  Wende Mott CNM 07/18/2018, 10:31 AM

## 2018-07-18 NOTE — Discharge Instructions (Signed)
Eating Plan for Pregnant Women While you are pregnant, your body will require additional nutrition to help support your growing baby. It is recommended that you consume:  150 additional calories each day during your first trimester.  300 additional calories each day during your second trimester.  300 additional calories each day during your third trimester.  Eating a healthy, well-balanced diet is very important for your health and for your baby's health. You also have a higher need for some vitamins and minerals, such as folic acid, calcium, iron, and vitamin D. What do I need to know about eating during pregnancy?  Do not try to lose weight or go on a diet during pregnancy.  Choose healthy, nutritious foods. Choose  of a sandwich with a glass of milk instead of a candy bar or a high-calorie sugar-sweetened beverage.  Limit your overall intake of foods that have "empty calories." These are foods that have little nutritional value, such as sweets, desserts, candies, sugar-sweetened beverages, and fried foods.  Eat a variety of foods, especially fruits and vegetables.  Take a prenatal vitamin to help meet the additional needs during pregnancy, specifically for folic acid, iron, calcium, and vitamin D.  Remember to stay active. Ask your health care provider for exercise recommendations that are specific to you.  Practice good food safety and cleanliness, such as washing your hands before you eat and after you prepare raw meat. This helps to prevent foodborne illnesses, such as listeriosis, that can be very dangerous for your baby. Ask your health care provider for more information about listeriosis. What does 150 extra calories look like? Healthy options for an additional 150 calories each day could be any of the following:  Plain low-fat yogurt (6-8 oz) with  cup of berries.  1 apple with 2 teaspoons of peanut butter.  Cut-up vegetables with  cup of hummus.  Low-fat chocolate milk  (8 oz or 1 cup).  1 string cheese with 1 medium orange.   of a peanut butter and jelly sandwich on whole-wheat bread (1 tsp of peanut butter).  For 300 calories, you could eat two of those healthy options each day. What is a healthy amount of weight to gain? The recommended amount of weight for you to gain is based on your pre-pregnancy BMI. If your pre-pregnancy BMI was:  Less than 18 (underweight), you should gain 28-40 lb.  18-24.9 (normal), you should gain 25-35 lb.  25-29.9 (overweight), you should gain 15-25 lb.  Greater than 30 (obese), you should gain 11-20 lb.  What if I am having twins or multiples? Generally, pregnant women who will be having twins or multiples may need to increase their daily calories by 300-600 calories each day. The recommended range for total weight gain is 25-54 lb, depending on your pre-pregnancy BMI. Talk with your health care provider for specific guidance about additional nutritional needs, weight gain, and exercise during your pregnancy. What foods can I eat? Grains Any grains. Try to choose whole grains, such as whole-wheat bread, oatmeal, or brown rice. Vegetables Any vegetables. Try to eat a variety of colors and types of vegetables to get a full range of vitamins and minerals. Remember to wash your vegetables well before eating. Fruits Any fruits. Try to eat a variety of colors and types of fruit to get a full range of vitamins and minerals. Remember to wash your fruits well before eating. Meats and Other Protein Sources Lean meats, including chicken, [place], fish, and lean cuts of beef, veal,  or pork. Make sure that all meats are cooked to "well done." Tofu. Tempeh. Beans. Eggs. Peanut butter and other nut butters. Seafood, such as shrimp, crab, and lobster. If you choose fish, select types that are higher in omega-3 fatty acids, including salmon, herring, mussels, trout, sardines, and pollock. Make sure that all meats are cooked to food-safe  temperatures. Dairy Pasteurized milk and milk alternatives. Pasteurized yogurt and pasteurized cheese. Cottage cheese. Sour cream. Beverages Water. Juices that contain 100% fruit juice or vegetable juice. Caffeine-free teas and decaffeinated coffee. Drinks that contain caffeine are okay to drink, but it is better to avoid caffeine. Keep your total caffeine intake to less than 200 mg each day (12 oz of coffee, tea, or soda) or as directed by your health care provider. Condiments Any pasteurized condiments. Sweets and Desserts Any sweets and desserts. Fats and Oils Any fats and oils. The items listed above may not be a complete list of recommended foods or beverages. Contact your dietitian for more options. What foods are not recommended? Vegetables Unpasteurized (raw) vegetable juices. Fruits Unpasteurized (raw) fruit juices. Meats and Other Protein Sources Cured meats that have nitrates, such as bacon, salami, and hotdogs. Luncheon meats, bologna, or other deli meats (unless they are reheated until they are steaming hot). Refrigerated pate, meat spreads from a meat counter, smoked seafood that is found in the refrigerated section of a store. Raw fish, such as sushi or sashimi. High mercury content fish, such as tilefish, shark, swordfish, and king mackerel. Raw meats, such as tuna or beef tartare. Undercooked meats and poultry. Make sure that all meats are cooked to food-safe temperatures. Dairy Unpasteurized (raw) milk and any foods that have raw milk in them. Soft cheeses, such as feta, queso blanco, queso fresco, Brie, Camembert cheeses, blue-veined cheeses, and Panela cheese (unless it is made with pasteurized milk, which must be stated on the label). Beverages Alcohol. Sugar-sweetened beverages, such as sodas, teas, or energy drinks. Condiments Homemade fermented foods and drinks, such as pickles, sauerkraut, or kombucha drinks. (Store-bought pasteurized versions of these are  okay.) Other Salads that are made in the store, such as ham salad, chicken salad, egg salad, tuna salad, and seafood salad. The items listed above may not be a complete list of foods and beverages to avoid. Contact your dietitian for more information. This information is not intended to replace advice given to you by your health care provider. Make sure you discuss any questions you have with your health care provider. Document Released: 09/01/2014 Document Revised: 04/24/2016 Document Reviewed: 05/02/2014 Elsevier Interactive Patient Education  [employer].

## 2018-07-18 NOTE — MAU Note (Addendum)
EMS arrival. Pt is 24wks, preg with twins. Was at worked, started to get dizzy, hearing started to go, started to say something.  Next thing she knew, she was waking up on the floor. Manager said she passed out, but did not hit head or abd, pt diaphoretic.  IV started by EMS ( saline lock, no fluids infusing on arrival). cbg 84. Pt denies bleeding or loss of fluid.  Some cramping and pressure in lower abd.

## 2018-07-19 LAB — CULTURE, OB URINE

## 2018-07-24 ENCOUNTER — Encounter (HOSPITAL_COMMUNITY): Payer: Self-pay | Admitting: *Deleted

## 2018-07-24 ENCOUNTER — Other Ambulatory Visit (HOSPITAL_COMMUNITY): Payer: Self-pay

## 2018-07-24 ENCOUNTER — Inpatient Hospital Stay (HOSPITAL_COMMUNITY)
Admission: AD | Admit: 2018-07-24 | Discharge: 2018-07-24 | Disposition: A | Discharge status: Home / Self Care | Payer: Medicaid Other | Source: Ambulatory Visit | Attending: Obstetrics and Gynecology | Admitting: Obstetrics and Gynecology

## 2018-07-24 DIAGNOSIS — O162 Unspecified maternal hypertension, second trimester: Secondary | ICD-10-CM | POA: Insufficient documentation

## 2018-07-24 DIAGNOSIS — Z8249 Family history of ischemic heart disease and other diseases of the circulatory system: Secondary | ICD-10-CM | POA: Diagnosis not present

## 2018-07-24 DIAGNOSIS — R55 Syncope and collapse: Secondary | ICD-10-CM | POA: Diagnosis present

## 2018-07-24 DIAGNOSIS — Z825 Family history of asthma and other chronic lower respiratory diseases: Secondary | ICD-10-CM | POA: Insufficient documentation

## 2018-07-24 DIAGNOSIS — Z9889 Other specified postprocedural states: Secondary | ICD-10-CM | POA: Insufficient documentation

## 2018-07-24 DIAGNOSIS — Z3A24 24 weeks gestation of pregnancy: Secondary | ICD-10-CM | POA: Diagnosis not present

## 2018-07-24 DIAGNOSIS — R42 Dizziness and giddiness: Secondary | ICD-10-CM | POA: Diagnosis not present

## 2018-07-24 DIAGNOSIS — O9989 Other specified diseases and conditions complicating pregnancy, childbirth and the puerperium: Secondary | ICD-10-CM | POA: Diagnosis not present

## 2018-07-24 DIAGNOSIS — Z833 Family history of diabetes mellitus: Secondary | ICD-10-CM | POA: Insufficient documentation

## 2018-07-24 DIAGNOSIS — Z808 Family history of malignant neoplasm of other organs or systems: Secondary | ICD-10-CM | POA: Insufficient documentation

## 2018-07-24 DIAGNOSIS — O3482 Maternal care for other abnormalities of pelvic organs, second trimester: Secondary | ICD-10-CM | POA: Insufficient documentation

## 2018-07-24 DIAGNOSIS — O26892 Other specified pregnancy related conditions, second trimester: Secondary | ICD-10-CM | POA: Insufficient documentation

## 2018-07-24 DIAGNOSIS — N898 Other specified noninflammatory disorders of vagina: Secondary | ICD-10-CM

## 2018-07-24 LAB — COMPREHENSIVE METABOLIC PANEL
ALBUMIN: 3.2 g/dL — AB (ref 3.5–5.0)
ALK PHOS: 90 U/L (ref 38–126)
ALT: 21 U/L (ref 0–44)
AST: 20 U/L (ref 15–41)
Anion gap: 10 (ref 5–15)
BILIRUBIN TOTAL: 0.3 mg/dL (ref 0.3–1.2)
BUN: 11 mg/dL (ref 6–20)
CO2: 21 mmol/L — AB (ref 22–32)
Calcium: 9.4 mg/dL (ref 8.9–10.3)
Chloride: 102 mmol/L (ref 98–111)
Creatinine, Ser: 0.56 mg/dL (ref 0.44–1.00)
GFR calc Af Amer: >60 mL/min (ref >60)
GFR calc non Af Amer: >60 mL/min (ref >60)
Glucose, Bld: 79 mg/dL (ref 70–99)
Potassium: 3.9 mmol/L (ref 3.5–5.1)
Sodium: 133 mmol/L — ABNORMAL LOW (ref 135–145)
Total Protein: 7 g/dL (ref 6.5–8.1)

## 2018-07-24 LAB — URINALYSIS, ROUTINE W REFLEX MICROSCOPIC
BILIRUBIN URINE: NEGATIVE
Glucose, UA: NEGATIVE mg/dL
Hgb urine dipstick: NEGATIVE
Ketones, ur: 5 mg/dL — AB
NITRITE: NEGATIVE
PH: 5 (ref 5.0–8.0)
Protein, ur: 100 mg/dL — AB
SPECIFIC GRAVITY, URINE: 1.028 (ref 1.005–1.030)
Squamous Epithelial / LPF: >50 — ABNORMAL HIGH (ref 0–5)
WBC, UA: >50 WBC/hpf — ABNORMAL HIGH (ref 0–5)

## 2018-07-24 LAB — TSH: TSH: 0.977 u[IU]/mL (ref 0.350–4.500)

## 2018-07-24 LAB — CBC
HEMATOCRIT: 30.6 % — AB (ref 36.0–46.0)
HEMOGLOBIN: 10 g/dL — AB (ref 12.0–15.0)
MCH: 30.1 pg (ref 26.0–34.0)
MCHC: 32.7 g/dL (ref 30.0–36.0)
MCV: 92.2 fL (ref 78.0–100.0)
Platelets: 328 10*3/uL (ref 150–400)
RBC: 3.32 MIL/uL — ABNORMAL LOW (ref 3.87–5.11)
RDW: 13.4 % (ref 11.5–15.5)
WBC: 8 10*3/uL (ref 4.0–10.5)

## 2018-07-24 LAB — WET PREP, GENITAL
Clue Cells Wet Prep HPF POC: NONE SEEN
SPERM: NONE SEEN
Trich, Wet Prep: NONE SEEN
YEAST WET PREP: NONE SEEN

## 2018-07-24 LAB — GLUCOSE, CAPILLARY: GLUCOSE-CAPILLARY: 94 mg/dL (ref 70–99)

## 2018-07-24 LAB — T4, FREE: FREE T4: 0.7 ng/dL — AB (ref 0.82–1.77)

## 2018-07-24 NOTE — MAU Note (Addendum)
[name] [name] is a 32 y.o. at [redacted]w[redacted]d here in MAU reporting:  +syncope Reports being at home and passed out. Unsure if she hit anything. States when she came to she called for a ride to the hospital. Has a history of these episodes. Reports being hot and dizzy prior to. Expressed she would like to know why this is happening. Pain score: denies  +vaginal discharge and itching Reports white thick discharge Vitals:   07/24/18 1701  BP: 137/85  Pulse: (!) 139  Resp: 20  Temp: 98.4 F (36.9 C)  SpO2: 100%    Lab orders placed from triage: ua

## 2018-07-24 NOTE — MAU Provider Note (Signed)
Chief Complaint:  Dizziness; Loss of Consciousness; Vaginal Discharge; and Vaginal Itching   First Provider Initiated Contact with Patient 07/24/18 1737     HPI: [name] [name] is a 32 y.o. [id number] at [redacted]w[redacted]d with di/di twins who presents to maternity admissions reporting syncopal episode. States this has happened 4 or 5 times in the last month. States most times it has happened while she was at work but happened at home too. She feels hot, dizzy, and pre syncopal after getting out of the shower and when sitting on the toilet for a BM. Denies chest pain, SOB, headache. Last time she ate was at 6am this morning, she had 2 egg sandwiches. Reports drinking 4 (16 oz) bottles of water today. Last time she was seen in MAU for same symptoms, it has been 12+ hrs since she had eaten.  Currently asymptomatic.  Denies abdominal pain. Positive fetal movement x 2.    Past Medical History:  Diagnosis Date  . Abnormal Pap smear    f/u was normal  . Chronic hypertension   . DEPRESSION, MAJOR, RECURRENT, MODERATE 04/18/2010   Qualifier: Diagnosis of  By: Georgina Snell MD, Ellard Artis    . Headache(784.0)   . History of abnormal Pap smear 07/28/2012  . Infection    trich, chlamydia, gonorrhea  . Ovarian cyst   . Urinary tract infection    OB History  Gravida Para Term Preterm AB Living  6 5 5  0 0 5  SAB TAB Ectopic Multiple Live Births  0 0 0 0 5    # Outcome Date GA Lbr Len/2nd Weight Sex Delivery Anes PTL Lv  6 Current           5 Term 11/18/15 [redacted]w[redacted]d 02:35 / 00:15 3425 g M Vag-Spont EPI  LIV  4 Term 07/13/13 [redacted]w[redacted]d 02:11 3501 g M VBAC EPI  LIV     Birth Comments: extra digits on hands bilaterally  3 Term 11/22/10 [redacted]w[redacted]d  3317 g F VBAC  N LIV     Birth Comments: vbac  2 Term 09/23/09 [redacted]w[redacted]d  3487 g M VBAC  N LIV     Birth Comments: VBAC  1 Term 10/27/06 [redacted]w[redacted]d  3430 g F CS-LTranv  N DEC     Birth Comments: fetal decels   Past Surgical History:  Procedure Laterality Date  . CESAREAN SECTION    . TONSILLECTOMY     . WISDOM TOOTH EXTRACTION     Family History  Problem Relation Age of Onset  . Hypertension Mother   . Hypertension Father   . Asthma Brother   . Cancer Maternal Uncle        lung  . Diabetes Maternal Grandmother   . Cancer Maternal Grandmother        thyroid  . Hypertension Maternal Grandmother   . Other Neg Hx    Social History   Tobacco Use  . Smoking status: Never Smoker  . Smokeless tobacco: Never Used  Substance Use Topics  . Alcohol use: Not Currently    Alcohol/week: 2.0 standard drinks    Types: 1 Glasses of wine, 1 Shots of liquor per week    Comment: Not currently  . Drug use: Not Currently    Types: Marijuana    Comment: Not currently   No Known Allergies No medications prior to admission.    I have reviewed patient's Past Medical Hx, Surgical Hx, Family Hx, Social Hx, medications and allergies.   ROS:  Review of Systems  Constitutional: Negative.  Respiratory: Negative for shortness of breath.   Cardiovascular: Negative for chest pain.  Gastrointestinal: Negative.   Genitourinary: Positive for vaginal discharge. Negative for vaginal bleeding.  Neurological: Positive for dizziness, syncope and light-headedness. Negative for headaches.    Physical Exam   No data found.  Constitutional: Well-developed, well-nourished female in no acute distress.  Cardiovascular: normal rate & rhythm, no murmur Respiratory: normal effort, lung sounds clear throughout GI: Abd soft, non-tender, gravid appropriate for gestational age. Pos BS x 4 MS: Extremities nontender, no edema, normal ROM Neurologic: Alert and oriented x 4.   Fetal Tracing: Baby A Baseline: 150 Variability: moderate Accelerations: none Decelerations: none  Baby B Baseline: 155 Variability: moderate Accelerations: none Decelerations: none    Labs: No results found for this or any previous visit (from the past 24 hour(s)). MAU Course: Orders Placed This Encounter  Procedures  . Wet  prep, genital  . Urinalysis, Routine w reflex microscopic  . CBC  . Comprehensive metabolic panel  . TSH  . T4, free  . Glucose, capillary  . ED EKG  . Discharge patient   No orders of the defined types were placed in this encounter.   MDM: EKG = sinus tachy MHR initially 130s but came down to low 100s which appears to be her baseline during this pregnancy. VS otherwise stable CBC, CMP, & thyroid labs ordered. Pt had low TSH earlier in the pregnancy with plans to reassess at 28 wks.  CBG 94 Discussed possible causes of symptoms & ways to help prevent them. Will send msg to office for possible cardiology referral as the syncope is recurrent & has some tachycardia.  Thyroid labs pending.   Assessment: 1. Vasovagal syncope   2. Dizziness     Plan: Discharge home in stable condition.  Labs pending Prior to discharge patient admitted to not eating at all today; discuss importance of eating small meals throughout the day.  Msg to office for cards referral   Allergies as of 07/24/2018   No Known Allergies     Medication List    TAKE these medications   aspirin EC 81 MG tablet Take 1 tablet (81 mg total) by mouth daily. Take after 12 weeks for prevention of preeclampsia later in pregnancy   Butalbital-APAP-Caffeine 50-325-40 MG capsule Take 1 capsule by mouth every 6 (six) hours as needed for headache.   PRENATAL PLUS/IRON 27-1 MG Tabs Take 1 tablet by mouth daily.   promethazine 25 MG tablet Commonly known as:  PHENERGAN Take 1 tablet (25 mg total) by mouth every 6 (six) hours as needed for nausea or vomiting.       Jorje Guild, NP 07/25/2018 8:03 PM

## 2018-07-24 NOTE — Discharge Instructions (Signed)
Dizziness °Dizziness is a common problem. It makes you feel unsteady or light-headed. You may feel like you are about to pass out (faint). Dizziness can lead to getting hurt if you stumble or fall. Dizziness can be caused by many things, including: °· Medicines. °· Not having enough water in your body (dehydration). °· Illness. ° °Follow these instructions at home: °Eating and drinking °· Drink enough fluid to keep your pee (urine) clear or pale yellow. This helps to keep you from getting dehydrated. Try to drink more clear fluids, such as water. °· Do not drink alcohol. °· Limit how much caffeine you drink or eat, if your doctor tells you to do that. °· Limit how much salt (sodium) you drink or eat, if your doctor tells you to do that. °Activity °· Avoid making quick movements. °? When you stand up from sitting in a chair, steady yourself until you feel okay. °? In the morning, first sit up on the side of the bed. When you feel okay, stand slowly while you hold onto something. Do this until you know that your balance is fine. °· If you need to stand in one place for a long time, move your legs often. Tighten and relax the muscles in your legs while you are standing. °· Do not drive or use heavy machinery if you feel dizzy. °· Avoid bending down if you feel dizzy. Place items in your home so you can reach them easily without leaning over. °Lifestyle °· Do not use any products that contain nicotine or tobacco, such as cigarettes and e-cigarettes. If you need help quitting, ask your doctor. °· Try to lower your stress level. You can do this by using methods such as yoga or meditation. Talk with your doctor if you need help. °General instructions °· Watch your dizziness for any changes. °· Take over-the-counter and prescription medicines only as told by your doctor. Talk with your doctor if you think that you are dizzy because of a medicine that you are taking. °· Tell a friend or a family member that you are feeling  dizzy. If he or she notices any changes in your behavior, have this person call your doctor. °· Keep all follow-up visits as told by your doctor. This is important. °Contact a doctor if: °· Your dizziness does not go away. °· Your dizziness or light-headedness gets worse. °· You feel sick to your stomach (nauseous). °· You have trouble hearing. °· You have new symptoms. °· You are unsteady on your feet. °· You feel like the room is spinning. °Get help right away if: °· You throw up (vomit) or have watery poop (diarrhea), and you cannot eat or drink anything. °· You have trouble: °? Talking. °? Walking. °? Swallowing. °? Using your arms, hands, or legs. °· You feel generally weak. °· You are not thinking clearly, or you have trouble forming sentences. A friend or family member may notice this. °· You have: °? Chest pain. °? Pain in your belly (abdomen). °? Shortness of breath. °? Sweating. °· Your vision changes. °· You are bleeding. °· You have a very bad headache. °· You have neck pain or a stiff neck. °· You have a fever. °These symptoms may be an emergency. Do not wait to see if the symptoms will go away. Get medical help right away. Call your local emergency services (911 in the [place] Do not drive yourself to the hospital. °Summary °· Dizziness makes you feel unsteady or light-headed. You may   feel like you are about to pass out (faint).  Drink enough fluid to keep your pee (urine) clear or pale yellow. Do not drink alcohol.  Avoid making quick movements if you feel dizzy. Watch your dizziness for any changes. Vasovagal Syncope, Adult Syncope, which is commonly known as fainting or passing out, is a temporary loss of consciousness. It occurs when the blood flow to the brain is reduced. Vasovagal syncope, also called neurocardiogenic syncope, is a fainting spell that happens when blood flow to the brain is reduced because of a sudden drop in heart rate and blood pressure. Vasovagal syncope is usually  harmless. However, you can get injured if you fall during a fainting spell. What are the causes? This condition is caused by a drop in heart rate and blood pressure, usually in response to a trigger. Many things and situations can trigger an episode, including:  Pain.  Fear.  The sight of blood. This may occur during medical procedures, such as when blood is being drawn from a vein.  Common activities, such as coughing, swallowing, stretching, or going to the bathroom.  Emotional stress.  Being in a confined space.  Prolonged standing, especially in a warm environment.  Lack of sleep or rest.  Not eating for a long time.  Not drinking enough liquids.  Recent illness.  Drinking alcohol.  Taking drugs that affect blood pressure, such as marijuana, cocaine, opiates, or inhalants.  What are the signs or symptoms? Before a fainting episode, you may:  Feel dizzy or light-headed.  Become pale.  Sense that you are going to faint.  Feel like the room is spinning.  Only see directly ahead (tunnel vision).  Feel sick to your stomach (nauseous).  See spots.  Slowly lose vision.  Hear ringing in your ears.  Have a headache.  Feel warm and sweaty.  Feel a sensation of pins and needles.  During the fainting spell, you may twitch or make jerky movements. Fainting spells usually last no longer than a few minutes before you wake up. If you get up too quickly before your body can recover, you may faint again. How is this diagnosed? This condition is diagnosed based on your symptoms, your medical history, and a physical exam. Tests may be done to rule out other causes of fainting. Tests may include:  Blood tests.  Heart tests, such as an electrocardiogram (ECG), echocardiogram, or electrophysiology study.  A test to check your response to changes in position (tilt table test).  How is this treated? Usually, treatment is not needed for this condition. Your health care  provider may suggest ways to help prevent fainting episodes. These may include:  Drinking additional fluids if you are exposed to a trigger.  Sitting or lying down if you notice signs that an episode is coming.  If your fainting spells continue, your health care provider may recommend that you:  Take medicines to prevent fainting or to help reduce further episodes of fainting.  Do certain exercises.  Wear compression stockings.  Have surgery to place a pacemaker in your body (rare).  Follow these instructions at home:  Learn to identify the signs that an episode is coming.  Sit or lie down at the first sign of a fainting spell. If you sit down, put your head down between your legs. If you lie down, swing your legs up in the air to increase blood flow to the brain.  Avoid hot tubs and saunas.  Avoid standing for a long  time. If you have to stand for a long time, try: ? Crossing your legs. ? Flexing and stretching your leg muscles. ? Squatting. ? Moving your legs. ? Bending over.  Drink enough fluid to keep your urine clear or pale yellow.  Make changes to your diet that your health care provider recommends. You may be told to: ? Avoid caffeine. ? Eat more salt.  Take over-the-counter and prescription medicines only as told by your health care provider. Contact a health care provider if:  You continue to have fainting spells despite treatment.  You faint more often despite treatment.  You lose consciousness for more than a few minutes.  You faint during or after exercising or after being startled.  You have twitching or jerky movements for longer than a few seconds during a fainting spell.  You have an episode of twitching or jerky movements without fainting. Get help right away if:  A fainting spell leads to an injury or bleeding.  You have new symptoms that occur with the fainting spells, such as: ? Shortness of breath. ? Chest pain. ? Irregular  heartbeat.  You twitch or make jerky movements for more than 5 minutes.  You twitch or make jerky movements during more than one fainting spell. This information is not intended to replace advice given to you by your health care provider. Make sure you discuss any questions you have with your health care provider. Document Released: 11/03/2012 Document Revised: 04/30/2016 Document Reviewed: 09/15/2015 Elsevier Interactive Patient Education  [employer].   This information is not intended to replace advice given to you by your health care provider. Make sure you discuss any questions you have with your health care provider. Document Released: 11/06/2011 Document Revised: 12/04/2016 Document Reviewed: 12/04/2016 Elsevier Interactive Patient Education  2017 [employer].

## 2018-07-25 LAB — T3: T3 TOTAL: 217 ng/dL — AB (ref 71–180)

## 2018-07-27 ENCOUNTER — Telehealth: Payer: Self-pay

## 2018-07-27 DIAGNOSIS — R55 Syncope and collapse: Secondary | ICD-10-CM

## 2018-07-27 NOTE — Telephone Encounter (Signed)
Per Jorje Guild, NP pt needs a referral to cardiology for syncope episodes.   Appt scheduled @ [facility] for October 1st @ 0840 @ [place]. Pt will be notified @ next prenatal appt. MyChart message sent.

## 2018-07-28 ENCOUNTER — Telehealth (HOSPITAL_COMMUNITY): Payer: Self-pay | Admitting: *Deleted

## 2018-08-10 ENCOUNTER — Encounter: Payer: Self-pay | Admitting: *Deleted

## 2018-08-11 ENCOUNTER — Other Ambulatory Visit (HOSPITAL_COMMUNITY): Payer: Self-pay | Admitting: *Deleted

## 2018-08-11 ENCOUNTER — Ambulatory Visit (HOSPITAL_COMMUNITY)
Admission: RE | Admit: 2018-08-11 | Discharge: 2018-08-11 | Disposition: A | Discharge status: Home / Self Care | Payer: Medicaid Other | Source: Ambulatory Visit | Attending: Obstetrics and Gynecology | Admitting: Obstetrics and Gynecology

## 2018-08-11 ENCOUNTER — Encounter (HOSPITAL_COMMUNITY): Payer: Self-pay

## 2018-08-11 DIAGNOSIS — O99212 Obesity complicating pregnancy, second trimester: Secondary | ICD-10-CM | POA: Diagnosis not present

## 2018-08-11 DIAGNOSIS — O30049 Twin pregnancy, dichorionic/diamniotic, unspecified trimester: Secondary | ICD-10-CM

## 2018-08-11 DIAGNOSIS — Z3A27 27 weeks gestation of pregnancy: Secondary | ICD-10-CM | POA: Insufficient documentation

## 2018-08-11 DIAGNOSIS — O34219 Maternal care for unspecified type scar from previous cesarean delivery: Secondary | ICD-10-CM

## 2018-08-11 DIAGNOSIS — O30043 Twin pregnancy, dichorionic/diamniotic, third trimester: Secondary | ICD-10-CM | POA: Diagnosis not present

## 2018-08-11 DIAGNOSIS — O30042 Twin pregnancy, dichorionic/diamniotic, second trimester: Secondary | ICD-10-CM

## 2018-08-18 ENCOUNTER — Ambulatory Visit (INDEPENDENT_AMBULATORY_CARE_PROVIDER_SITE_OTHER): Payer: Medicaid Other | Admitting: Obstetrics & Gynecology

## 2018-08-18 ENCOUNTER — Encounter: Payer: Self-pay | Admitting: Family Medicine

## 2018-08-18 ENCOUNTER — Other Ambulatory Visit (HOSPITAL_COMMUNITY)
Admission: RE | Admit: 2018-08-18 | Discharge: 2018-08-18 | Disposition: A | Discharge status: Home / Self Care | Payer: Medicaid Other | Source: Ambulatory Visit | Attending: Obstetrics and Gynecology | Admitting: Obstetrics and Gynecology

## 2018-08-18 ENCOUNTER — Encounter: Payer: Medicaid Other | Admitting: Obstetrics and Gynecology

## 2018-08-18 VITALS — BP 130/78 | HR 121 | Wt 264.0 lb

## 2018-08-18 DIAGNOSIS — A609 Anogenital herpesviral infection, unspecified: Secondary | ICD-10-CM

## 2018-08-18 DIAGNOSIS — O0992 Supervision of high risk pregnancy, unspecified, second trimester: Secondary | ICD-10-CM

## 2018-08-18 DIAGNOSIS — Z3A28 28 weeks gestation of pregnancy: Secondary | ICD-10-CM | POA: Diagnosis not present

## 2018-08-18 DIAGNOSIS — O360930 Maternal care for other rhesus isoimmunization, third trimester, not applicable or unspecified: Secondary | ICD-10-CM | POA: Diagnosis present

## 2018-08-18 DIAGNOSIS — Z23 Encounter for immunization: Secondary | ICD-10-CM

## 2018-08-18 DIAGNOSIS — R7989 Other specified abnormal findings of blood chemistry: Secondary | ICD-10-CM

## 2018-08-18 DIAGNOSIS — Z6791 Unspecified blood type, Rh negative: Secondary | ICD-10-CM

## 2018-08-18 DIAGNOSIS — O30043 Twin pregnancy, dichorionic/diamniotic, third trimester: Secondary | ICD-10-CM

## 2018-08-18 DIAGNOSIS — O98319 Other infections with a predominantly sexual mode of transmission complicating pregnancy, unspecified trimester: Secondary | ICD-10-CM

## 2018-08-18 DIAGNOSIS — Z6841 Body Mass Index (BMI) 40.0 and over, adult: Secondary | ICD-10-CM

## 2018-08-18 DIAGNOSIS — A64 Unspecified sexually transmitted disease: Secondary | ICD-10-CM

## 2018-08-18 DIAGNOSIS — O26893 Other specified pregnancy related conditions, third trimester: Secondary | ICD-10-CM

## 2018-08-18 LAB — POCT URINALYSIS DIP (DEVICE)
GLUCOSE, UA: NEGATIVE mg/dL
HGB URINE DIPSTICK: NEGATIVE
Nitrite: NEGATIVE
PROTEIN: 100 mg/dL — AB
Specific Gravity, Urine: 1.02 (ref 1.005–1.030)
Urobilinogen, UA: 0.2 mg/dL (ref 0.0–1.0)
pH: 6.5 (ref 5.0–8.0)

## 2018-08-18 MED ORDER — FLUCONAZOLE 150 MG PO TABS: 150.0000 mg | ORAL_TABLET | Freq: Once | ORAL | 3 refills | Status: AC

## 2018-08-18 MED ORDER — RHO D IMMUNE GLOBULIN 1500 UNIT/2ML IJ SOSY
300.0000 ug | PREFILLED_SYRINGE | Freq: Once | INTRAMUSCULAR | Status: AC
  Administered 2018-08-18: 300 ug via INTRAMUSCULAR
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
## 2018-08-19 LAB — CBC
HEMATOCRIT: 26.6 % — AB (ref 34.0–46.6)
HEMOGLOBIN: 8.5 g/dL — AB (ref 11.1–15.9)
MCH: 27.7 pg (ref 26.6–33.0)
MCHC: 32 g/dL (ref 31.5–35.7)
MCV: 87 fL (ref 79–97)
Platelets: 368 10*3/uL (ref 150–450)
RBC: 3.07 x10E6/uL — ABNORMAL LOW (ref 3.77–5.28)
RDW: 15.4 % (ref 12.3–15.4)
WBC: 9.3 10*3/uL (ref 3.4–10.8)

## 2018-08-19 LAB — T4, FREE: FREE T4: 0.88 ng/dL (ref 0.82–1.77)

## 2018-08-19 LAB — CERVICOVAGINAL ANCILLARY ONLY
Chlamydia: NEGATIVE
Neisseria Gonorrhea: NEGATIVE
Trichomonas: NEGATIVE

## 2018-08-19 LAB — TSH: TSH: 1.37 u[IU]/mL (ref 0.450–4.500)

## 2018-08-19 LAB — HIV ANTIBODY (ROUTINE TESTING W REFLEX): HIV Screen 4th Generation wRfx: NONREACTIVE

## 2018-08-19 LAB — ANTIBODY SCREEN: ANTIBODY SCREEN: NEGATIVE

## 2018-08-19 LAB — RPR: RPR Ser Ql: NONREACTIVE

## 2018-08-20 ENCOUNTER — Other Ambulatory Visit: Payer: Medicaid Other

## 2018-08-22 LAB — URINE CULTURE, OB REFLEX

## 2018-08-22 LAB — CULTURE, OB URINE

## 2018-08-23 ENCOUNTER — Other Ambulatory Visit: Payer: Self-pay | Admitting: Obstetrics & Gynecology

## 2018-08-23 ENCOUNTER — Encounter: Payer: Self-pay | Admitting: Obstetrics and Gynecology

## 2018-08-23 DIAGNOSIS — O99019 Anemia complicating pregnancy, unspecified trimester: Secondary | ICD-10-CM | POA: Insufficient documentation

## 2018-08-23 DIAGNOSIS — O2340 Unspecified infection of urinary tract in pregnancy, unspecified trimester: Secondary | ICD-10-CM | POA: Insufficient documentation

## 2018-08-23 MED ORDER — SULFAMETHOXAZOLE-TRIMETHOPRIM 800-160 MG PO TABS: 1.0000 | ORAL_TABLET | Freq: Two times a day (BID) | ORAL | 0 refills | Status: DC

## 2018-08-23 NOTE — Progress Notes (Unsigned)
Bactrim prescribed for proteus UTI. Patient notified via [place].

## 2018-08-25 ENCOUNTER — Other Ambulatory Visit: Payer: Self-pay | Admitting: Obstetrics & Gynecology

## 2018-08-25 ENCOUNTER — Ambulatory Visit (INDEPENDENT_AMBULATORY_CARE_PROVIDER_SITE_OTHER): Payer: Medicaid Other | Admitting: Obstetrics & Gynecology

## 2018-08-25 VITALS — BP 117/63 | HR 128 | Wt 264.0 lb

## 2018-08-25 DIAGNOSIS — O0992 Supervision of high risk pregnancy, unspecified, second trimester: Secondary | ICD-10-CM

## 2018-08-25 DIAGNOSIS — F70 Mild intellectual disabilities: Secondary | ICD-10-CM

## 2018-08-25 DIAGNOSIS — I10 Essential (primary) hypertension: Secondary | ICD-10-CM

## 2018-08-25 DIAGNOSIS — A609 Anogenital herpesviral infection, unspecified: Secondary | ICD-10-CM

## 2018-08-25 DIAGNOSIS — O99213 Obesity complicating pregnancy, third trimester: Secondary | ICD-10-CM

## 2018-08-25 DIAGNOSIS — O0993 Supervision of high risk pregnancy, unspecified, third trimester: Secondary | ICD-10-CM

## 2018-08-25 DIAGNOSIS — O99210 Obesity complicating pregnancy, unspecified trimester: Secondary | ICD-10-CM

## 2018-08-25 DIAGNOSIS — O30043 Twin pregnancy, dichorionic/diamniotic, third trimester: Secondary | ICD-10-CM

## 2018-08-25 NOTE — Progress Notes (Signed)
Called [facility] to schedule feraheme.
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
## 2018-08-26 LAB — SPECIMEN STATUS REPORT

## 2018-08-26 LAB — FERRITIN: FERRITIN: 12 ng/mL — AB (ref 15–150)

## 2018-08-26 LAB — HEMOGLOBIN A1C
ESTIMATED AVERAGE GLUCOSE: 114 mg/dL
Hgb A1c MFr Bld: 5.6 % (ref 4.8–5.6)

## 2018-08-31 ENCOUNTER — Encounter: Payer: Self-pay | Admitting: Cardiovascular Disease

## 2018-08-31 ENCOUNTER — Ambulatory Visit (INDEPENDENT_AMBULATORY_CARE_PROVIDER_SITE_OTHER): Payer: Medicaid Other | Admitting: Cardiovascular Disease

## 2018-08-31 VITALS — BP 102/70 | HR 121 | Ht 62.0 in | Wt 265.0 lb

## 2018-08-31 DIAGNOSIS — R55 Syncope and collapse: Secondary | ICD-10-CM | POA: Diagnosis not present

## 2018-08-31 DIAGNOSIS — R000 Tachycardia, unspecified: Secondary | ICD-10-CM | POA: Diagnosis not present

## 2018-08-31 DIAGNOSIS — R0681 Apnea, not elsewhere classified: Secondary | ICD-10-CM | POA: Diagnosis not present

## 2018-08-31 NOTE — Progress Notes (Signed)
Cardiology Office Note   Date:  08/31/2018   ID:  [name] [name], DOB 07/16/1986, MRN [id number]  PCP:  Kathrene Alu, MD  Cardiologist:   Skeet Latch, MD   Chief Complaint  Patient presents with  . Follow-up    History of Present Illness: [name] [name] is a 32 y.o. pregnant female with morbid obesity who is being seen today for the evaluation of tachycardia and syncope at the request of Kathrene Alu, MD.  She reports 4-5 episodes of syncope since May or June.  The episodes typically occur after standing for a few minutes.  She starts seeing spots and a bright light.  She then feels like she is trying to hear underwater.  She is unsure how long she is unconscious.  She has no tonic-clonic movement, loss of bowel or bladder.  There is no post-ictal confusion.  She reports that she is eating and drinking well.  On questioning she typically eats two meals daily and has 1-2 snacks.  She drinks the equivalent of 3 water bottles daily.  There is no associated chest pain, shortness of breath, lower extremity edema, orthopnea, or PND.  She is currently [redacted] weeks pregnant with twins.  She has no syncope or presyncope prior to her current pregnancy.  [name] does not get much exercise.  She notes that she snores loudly and that it has been worse since being pregnant.  He mother notes that she often stops breathing when she sleeps.  She has no orthopnea or PND.  Her thyroid function has been hyperthyroid during pregnancy but most recently normalized.  In general she doesn't have palpitations but does feel her heart racing with exertion.  She has no heart racing prior to her syncopal episodes.     Past Medical History:  Diagnosis Date  . Abnormal Pap smear    f/u was normal  . Chronic hypertension   . DEPRESSION, MAJOR, RECURRENT, MODERATE 04/18/2010   Qualifier: Diagnosis of  By: Georgina Snell MD, Ellard Artis    . Headache(784.0)   . History of abnormal Pap smear 07/28/2012  . Infection    trich,  chlamydia, gonorrhea  . Ovarian cyst   . Urinary tract infection     Past Surgical History:  Procedure Laterality Date  . CESAREAN SECTION    . TONSILLECTOMY    . WISDOM TOOTH EXTRACTION       Current Outpatient Medications  Medication Sig Dispense Refill  . aspirin EC 81 MG tablet Take 1 tablet (81 mg total) by mouth daily. Take after 12 weeks for prevention of preeclampsia later in pregnancy 300 tablet 2  . Butalbital-APAP-Caffeine 50-325-40 MG capsule Take 1 capsule by mouth every 6 (six) hours as needed for headache. 30 capsule 0  . Prenatal Vit-Fe Fumarate-FA (PRENATAL PLUS/IRON) 27-1 MG TABS Take 1 tablet by mouth daily. 90 each 6  . promethazine (PHENERGAN) 25 MG tablet Take 1 tablet (25 mg total) by mouth every 6 (six) hours as needed for nausea or vomiting. 30 tablet 1  . sulfamethoxazole-trimethoprim (BACTRIM DS,SEPTRA DS) 800-160 MG tablet Take 1 tablet by mouth 2 (two) times daily. 14 tablet 0   No current facility-administered medications for this visit.     Allergies:   Patient has no known allergies.    Social History:  The patient  reports that she has never smoked. She has never used smokeless tobacco. She reports that she drank about 2.0 standard drinks of alcohol per week. She reports that she  has current or past drug history. Drug: Marijuana.   Family History:  The patient's family history includes Arthritis in her mother; Asthma in her brother; Cancer in her maternal grandmother and maternal uncle; Diabetes in her maternal grandmother; Hypertension in her father, maternal grandmother, and mother.    ROS:  Please see the history of present illness.   Otherwise, review of systems are positive for none.   All other systems are reviewed and negative.    PHYSICAL EXAM: VS:  BP 102/70   Pulse (!) 121   Ht 5\' 2"  (1.575 m)   Wt 265 lb (120.2 kg)   LMP 02/01/2018   BMI 48.47 kg/m  , BMI Body mass index is 48.47 kg/m.  Sitting 110/72 Standing 106/70  GENERAL:   Well appearing HEENT:  Pupils equal round and reactive, fundi not visualized, oral mucosa unremarkable NECK:  No jugular venous distention, waveform within normal limits, carotid upstroke brisk and symmetric, no bruits, no thyromegaly LYMPHATICS:  No cervical adenopathy LUNGS:  Clear to auscultation bilaterally HEART: Tachycardic.  Regular rhythm.  PMI not displaced or sustained,S1 and S2 within normal limits, no S3, no S4, no clicks, no rubs, no murmurs ABD:  Flat, positive bowel sounds normal in frequency in pitch, no bruits, no rebound, no guarding, no midline pulsatile mass, no hepatomegaly, no splenomegaly EXT:  2 plus pulses throughout, trace edema, no cyanosis no clubbing SKIN:  No rashes no nodules NEURO:  Cranial nerves II through XII grossly intact, motor grossly intact throughout PSYCH:  Cognitively intact, oriented to person place and time   EKG:  EKG is not ordered today.   Recent Labs: 07/24/2018: ALT 21; BUN 11; Creatinine, Ser 0.56; Potassium 3.9; Sodium 133 08/18/2018: Hemoglobin 8.5; Platelets 368; TSH 1.370    Lipid Panel No results found for: CHOL, TRIG, HDL, CHOLHDL, VLDL, LDLCALC, LDLDIRECT    Wt Readings from Last 3 Encounters:  08/31/18 265 lb (120.2 kg)  08/25/18 264 lb (119.7 kg)  08/18/18 264 lb (119.7 kg)      ASSESSMENT AND PLAN:  # Recurrent syncope:  [name] was hypotensive after standing for 3 minutes.  Her symptoms are likely 2/2 orthostasis.  She is not drinking well.  Recommended that she increase her fluid intake to 6-8 water bottles daily.  We will check an echo to assess for any structural abnormalities.  # Tachycardia:  Likely 2/2 mild hyperthyroidism, pregnancy, and intravascular volume depletion as above.  Increase fluid intake and check echo as above.     Current medicines are reviewed at length with the patient today.  The patient does not have concerns regarding medicines.  The following changes have been made:  no  change  Labs/ tests ordered today include:  No orders of the defined types were placed in this encounter.    Disposition:   FU with Darrelle Barrell C. Oval Linsey, MD, [facility] in 1 month.     Signed, Kendahl Bumgardner C. Oval Linsey, MD, [facility]  08/31/2018 9:58 AM    [facility]

## 2018-08-31 NOTE — Patient Instructions (Signed)
Medication Instructions:  Your physician recommends that you continue on your current medications as directed. Please refer to the Current Medication list given to you today.  Labwork: NONE  Testing/Procedures: Your physician has requested that you have an echocardiogram. Echocardiography is a painless test that uses sound waves to create images of your heart. It provides your doctor with information about the size and shape of your heart and how well your heart's chambers and valves are working. This procedure takes approximately one hour. There are no restrictions for this procedure.  Your physician has recommended that you have a sleep study. This test records several body functions during sleep, including: brain activity, eye movement, oxygen and carbon dioxide blood levels, heart rate and rhythm, breathing rate and rhythm, the flow of air through your mouth and nose, snoring, body muscle movements, and chest and belly movement. THE OFFICE WILL CALL YOU TO SCHEDULE ONCE YOUR INSURANCE HAS APPROVED   Follow-Up: Your physician recommends that you schedule a follow-up appointment in: 1 MONTH  Any Other Special Instructions Will Be Listed Below (If Applicable).  YOU NEED TO INCREASE YOUR WATER INTAKE SO YOU ARE DRINKING 8 BOTTLES OF WATER EACH DAY   If you need a refill on your cardiac medications before your next appointment, please call your pharmacy.  Echocardiogram An echocardiogram, or echocardiography, uses sound waves (ultrasound) to produce an image of your heart. The echocardiogram is simple, painless, obtained within a short period of time, and offers valuable information to your health care provider. The images from an echocardiogram can provide information such as:  Evidence of coronary artery disease (CAD).  Heart size.  Heart muscle function.  Heart valve function.  Aneurysm detection.  Evidence of a past heart attack.  Fluid buildup around the heart.  Heart muscle  thickening.  Assess heart valve function.  Tell a health care provider about:  Any allergies you have.  All medicines you are taking, including vitamins, herbs, eye drops, creams, and over-the-counter medicines.  Any problems you or family members have had with anesthetic medicines.  Any blood disorders you have.  Any surgeries you have had.  Any medical conditions you have.  Whether you are pregnant or may be pregnant. What happens before the procedure? No special preparation is needed. Eat and drink normally. What happens during the procedure?  In order to produce an image of your heart, gel will be applied to your chest and a wand-like tool (transducer) will be moved over your chest. The gel will help transmit the sound waves from the transducer. The sound waves will harmlessly bounce off your heart to allow the heart images to be captured in real-time motion. These images will then be recorded.  You may need an IV to receive a medicine that improves the quality of the pictures. What happens after the procedure? You may return to your normal schedule including diet, activities, and medicines, unless your health care provider tells you otherwise. This information is not intended to replace advice given to you by your health care provider. Make sure you discuss any questions you have with your health care provider. Document Released: 11/14/2000 Document Revised: 07/05/2016 Document Reviewed: 07/25/2013 Elsevier Interactive Patient Education  2017 [employer].  Sleep Studies A sleep study (polysomnogram) is a series of tests done while you are sleeping. It can show how well you sleep. This can help your health care provider diagnose a sleep disorder and show how severe your sleep disorder is. A sleep study may  lead to treatment that will help you sleep better and prevent other medical problems caused by poor sleep. If you have a sleep disorder, you may also be at risk  for:  Sleep-related accidents.  High blood pressure.  Heart disease.  Stroke.  Other medical conditions.  Sleep disorders are common. Your health care provider may suspect a sleep disorder if you:  Have loud snoring most nights.  Have brief periods when you stop breathing at night.  Feel sleepy on most days.  Fall asleep suddenly during the day.  Have trouble falling asleep or staying asleep.  Feel like you need to move your legs when trying to fall asleep.  Have dreams that seem very real shortly after falling asleep.  Feel like you cannot move when you first wake up.  Which tests will I need to have? Most sleep studies last all night and include these tests:  Recordings of your brain activity.  Recordings of your eye movements.  Recording of your heart rate and rhythm.  Blood pressure readings.  Readings of the amount of oxygen in your blood.  Measurements of your chest and belly movement as you breathe during sleep.  If you have signs of the sleep disorder called sleep apnea during your test, you may get a mask to wear for the second half of the night.  The mask provides continuous positive airway pressure (CPAP). This may improve sleep apnea significantly.  You will then have all tests done again with the mask in place to see if your measurements and recordings change.  How are sleep studies done? Most sleep studies are done over one full night of sleep.  You will arrive at the study center in the evening and can go home in the morning.  Bring your pajamas and toothbrush.  Do not have caffeine on the day of your sleep study.  Your health care provider will let you know if you need to stop taking any of your regular medicines before the test.  To do the tests included in a polysomnogram, you will have:  Round, sticky patches with sensors attached to recording wires (electrodes) placed on your scalp, face, chest, and limbs.  Wires from all the  electrodes and sensors run from your bed to a computer. The wires can be taken off and put back on if you need to get out of bed to go to the bathroom.  A sensor placed over your nose to measure airflow.  A finger clip put on one finger to measure your blood oxygen level.  A belt around your belly and a belt around your chest to measure breathing movements.  Where are sleep studies done? Sleep studies are done at sleep centers. A sleep center may be inside a hospital, office, or clinic. The room where you have the study may look like a hospital room or a hotel room. The health care providers doing the study may come in and out of the room during the study. Most of the time, they will be in another room monitoring your test. How is information from sleep studies helpful? A polysomnogram can be used along with your medical history and a physical exam to diagnose conditions, such as:  Sleep apnea.  Restless legs syndrome.  Sleep-related seizure disorders.  Sleep-related movement disorders.  A medical doctor who specializes in sleep will evaluate your sleep study. The specialist will share the results with your primary health care provider. Treatments based on your sleep study may include:  Improving your sleep habits (sleep hygiene).  Wearing a CPAP mask.  Wearing an oral device at night to improve breathing and reduce snoring.  Taking medicine for: ? Restless legs syndrome. ? Sleep-related seizure disorder. ? Sleep-related movement disorder.  This information is not intended to replace advice given to you by your health care provider. Make sure you discuss any questions you have with your health care provider. Document Released: 05/24/2003 Document Revised: 07/13/2016 Document Reviewed: 01/23/2014 Elsevier Interactive Patient Education  [employer].

## 2018-09-02 ENCOUNTER — Ambulatory Visit (HOSPITAL_COMMUNITY)
Admission: RE | Admit: 2018-09-02 | Discharge: 2018-09-02 | Disposition: A | Discharge status: Home / Self Care | Payer: Medicaid Other | Source: Ambulatory Visit | Attending: Obstetrics & Gynecology | Admitting: Obstetrics & Gynecology

## 2018-09-02 ENCOUNTER — Telehealth: Payer: Self-pay | Admitting: *Deleted

## 2018-09-02 DIAGNOSIS — O0992 Supervision of high risk pregnancy, unspecified, second trimester: Secondary | ICD-10-CM | POA: Diagnosis not present

## 2018-09-02 DIAGNOSIS — Z3A29 29 weeks gestation of pregnancy: Secondary | ICD-10-CM | POA: Diagnosis not present

## 2018-09-02 MED ORDER — SODIUM CHLORIDE 0.9 % IV SOLN
510.0000 mg | INTRAVENOUS | Status: DC
  Administered 2018-09-02: 510 mg via INTRAVENOUS
  Filled 2018-09-02: qty 17

## 2018-09-02 MED ORDER — SODIUM CHLORIDE 0.9 % IV SOLN: 510.0000 mg | INTRAVENOUS | Status: DC

## 2018-09-02 NOTE — Telephone Encounter (Signed)
Patient returned a call to me and was notified of sleep study scheduled on Tuesday 10/05/18.

## 2018-09-02 NOTE — Telephone Encounter (Signed)
Left message to return a call to notify of sleep study appointment.

## 2018-09-02 NOTE — Telephone Encounter (Signed)
-----   Message from Earvin Hansen, LPN sent at [phone]  5:26 PM EDT ----- Hello ladies Patient needs sleep study Thanks Gail

## 2018-09-02 NOTE — Discharge Instructions (Signed)

## 2018-09-07 ENCOUNTER — Ambulatory Visit (HOSPITAL_COMMUNITY): Payer: Medicaid Other

## 2018-09-08 ENCOUNTER — Other Ambulatory Visit: Payer: Medicaid Other

## 2018-09-08 ENCOUNTER — Ambulatory Visit (INDEPENDENT_AMBULATORY_CARE_PROVIDER_SITE_OTHER): Payer: Medicaid Other | Admitting: Obstetrics and Gynecology

## 2018-09-08 ENCOUNTER — Ambulatory Visit (HOSPITAL_COMMUNITY)
Admission: RE | Admit: 2018-09-08 | Discharge: 2018-09-08 | Disposition: A | Discharge status: Home / Self Care | Payer: Medicaid Other | Source: Ambulatory Visit | Attending: Obstetrics and Gynecology | Admitting: Obstetrics and Gynecology

## 2018-09-08 ENCOUNTER — Other Ambulatory Visit (HOSPITAL_COMMUNITY): Payer: Self-pay | Admitting: *Deleted

## 2018-09-08 ENCOUNTER — Encounter (HOSPITAL_COMMUNITY): Payer: Self-pay

## 2018-09-08 VITALS — BP 131/61 | HR 112 | Wt 264.1 lb

## 2018-09-08 DIAGNOSIS — O30043 Twin pregnancy, dichorionic/diamniotic, third trimester: Secondary | ICD-10-CM | POA: Diagnosis not present

## 2018-09-08 DIAGNOSIS — A609 Anogenital herpesviral infection, unspecified: Secondary | ICD-10-CM

## 2018-09-08 DIAGNOSIS — O360130 Maternal care for anti-D [Rh] antibodies, third trimester, not applicable or unspecified: Secondary | ICD-10-CM

## 2018-09-08 DIAGNOSIS — Z3A31 31 weeks gestation of pregnancy: Secondary | ICD-10-CM | POA: Insufficient documentation

## 2018-09-08 DIAGNOSIS — Z6791 Unspecified blood type, Rh negative: Secondary | ICD-10-CM | POA: Insufficient documentation

## 2018-09-08 DIAGNOSIS — O99213 Obesity complicating pregnancy, third trimester: Secondary | ICD-10-CM | POA: Diagnosis not present

## 2018-09-08 DIAGNOSIS — O30049 Twin pregnancy, dichorionic/diamniotic, unspecified trimester: Secondary | ICD-10-CM

## 2018-09-08 DIAGNOSIS — O2340 Unspecified infection of urinary tract in pregnancy, unspecified trimester: Secondary | ICD-10-CM

## 2018-09-08 DIAGNOSIS — O10013 Pre-existing essential hypertension complicating pregnancy, third trimester: Secondary | ICD-10-CM | POA: Insufficient documentation

## 2018-09-08 DIAGNOSIS — O0992 Supervision of high risk pregnancy, unspecified, second trimester: Secondary | ICD-10-CM

## 2018-09-08 DIAGNOSIS — O26893 Other specified pregnancy related conditions, third trimester: Secondary | ICD-10-CM | POA: Diagnosis not present

## 2018-09-08 DIAGNOSIS — O34219 Maternal care for unspecified type scar from previous cesarean delivery: Secondary | ICD-10-CM | POA: Diagnosis not present

## 2018-09-08 DIAGNOSIS — O99212 Obesity complicating pregnancy, second trimester: Secondary | ICD-10-CM | POA: Insufficient documentation

## 2018-09-08 DIAGNOSIS — I10 Essential (primary) hypertension: Secondary | ICD-10-CM

## 2018-09-08 DIAGNOSIS — O2343 Unspecified infection of urinary tract in pregnancy, third trimester: Secondary | ICD-10-CM

## 2018-09-08 LAB — POCT URINALYSIS DIP (DEVICE)
Glucose, UA: NEGATIVE mg/dL
HGB URINE DIPSTICK: NEGATIVE
KETONES UR: 15 mg/dL — AB
Nitrite: NEGATIVE
PH: 6.5 (ref 5.0–8.0)
Protein, ur: 30 mg/dL — AB
Specific Gravity, Urine: 1.02 (ref 1.005–1.030)
Urobilinogen, UA: 1 mg/dL (ref 0.0–1.0)

## 2018-09-08 MED ORDER — MAGNESIUM OXIDE 240 MG PO PACK: 1.0000 | PACK | Freq: Every day | ORAL | Status: DC | PRN

## 2018-09-08 MED ORDER — VALACYCLOVIR HCL 1 G PO TABS: 1000.0000 mg | ORAL_TABLET | Freq: Every day | ORAL | 1 refills | Status: DC

## 2018-09-08 MED ORDER — ABDOMINAL BINDER/ELASTIC XL MISC: 1.0000 [IU] | 0 refills | Status: DC | PRN

## 2018-09-08 NOTE — Addendum Note (Signed)
Addended by: Aletha Halim on: 09/08/2018 11:53 AM   Modules accepted: Orders
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
## 2018-09-09 ENCOUNTER — Encounter (HOSPITAL_COMMUNITY)
Admission: RE | Admit: 2018-09-09 | Discharge: 2018-09-09 | Disposition: A | Discharge status: Home / Self Care | Payer: Medicaid Other | Source: Ambulatory Visit | Attending: Obstetrics & Gynecology | Admitting: Obstetrics & Gynecology

## 2018-09-09 DIAGNOSIS — D649 Anemia, unspecified: Secondary | ICD-10-CM | POA: Insufficient documentation

## 2018-09-09 DIAGNOSIS — Z3A00 Weeks of gestation of pregnancy not specified: Secondary | ICD-10-CM | POA: Insufficient documentation

## 2018-09-09 DIAGNOSIS — O99013 Anemia complicating pregnancy, third trimester: Secondary | ICD-10-CM | POA: Insufficient documentation

## 2018-09-09 LAB — GLUCOSE TOLERANCE, 2 HOURS W/ 1HR
Glucose, 1 hour: 141 mg/dL (ref 65–179)
Glucose, 2 hour: 141 mg/dL (ref 65–152)
Glucose, Fasting: 77 mg/dL (ref 65–91)

## 2018-09-09 MED ORDER — SODIUM CHLORIDE 0.9 % IV SOLN
510.0000 mg | INTRAVENOUS | Status: DC
  Administered 2018-09-09: 510 mg via INTRAVENOUS
  Filled 2018-09-09: qty 17

## 2018-09-15 ENCOUNTER — Other Ambulatory Visit: Payer: Medicaid Other

## 2018-09-21 ENCOUNTER — Encounter (HOSPITAL_COMMUNITY): Payer: Self-pay | Admitting: *Deleted

## 2018-09-21 ENCOUNTER — Inpatient Hospital Stay (HOSPITAL_COMMUNITY)
Admission: AD | Admit: 2018-09-21 | Discharge: 2018-09-27 | DRG: 786 | Disposition: A | Discharge status: Home / Self Care | Payer: Medicaid Other | Attending: Obstetrics & Gynecology | Admitting: Obstetrics & Gynecology

## 2018-09-21 ENCOUNTER — Other Ambulatory Visit: Payer: Self-pay

## 2018-09-21 DIAGNOSIS — O368331 Maternal care for abnormalities of the fetal heart rate or rhythm, third trimester, fetus 1: Secondary | ICD-10-CM | POA: Diagnosis present

## 2018-09-21 DIAGNOSIS — Z3A33 33 weeks gestation of pregnancy: Secondary | ICD-10-CM

## 2018-09-21 DIAGNOSIS — O9902 Anemia complicating childbirth: Secondary | ICD-10-CM | POA: Diagnosis present

## 2018-09-21 DIAGNOSIS — O26893 Other specified pregnancy related conditions, third trimester: Secondary | ICD-10-CM | POA: Diagnosis present

## 2018-09-21 DIAGNOSIS — O30043 Twin pregnancy, dichorionic/diamniotic, third trimester: Secondary | ICD-10-CM | POA: Diagnosis present

## 2018-09-21 DIAGNOSIS — O4292 Full-term premature rupture of membranes, unspecified as to length of time between rupture and onset of labor: Secondary | ICD-10-CM | POA: Diagnosis not present

## 2018-09-21 DIAGNOSIS — O9832 Other infections with a predominantly sexual mode of transmission complicating childbirth: Secondary | ICD-10-CM | POA: Diagnosis present

## 2018-09-21 DIAGNOSIS — D649 Anemia, unspecified: Secondary | ICD-10-CM | POA: Diagnosis present

## 2018-09-21 DIAGNOSIS — O99214 Obesity complicating childbirth: Secondary | ICD-10-CM | POA: Diagnosis present

## 2018-09-21 DIAGNOSIS — A6000 Herpesviral infection of urogenital system, unspecified: Secondary | ICD-10-CM | POA: Diagnosis present

## 2018-09-21 DIAGNOSIS — Z6791 Unspecified blood type, Rh negative: Secondary | ICD-10-CM | POA: Diagnosis not present

## 2018-09-21 DIAGNOSIS — O321XX0 Maternal care for breech presentation, not applicable or unspecified: Secondary | ICD-10-CM | POA: Diagnosis not present

## 2018-09-21 DIAGNOSIS — A609 Anogenital herpesviral infection, unspecified: Secondary | ICD-10-CM | POA: Diagnosis present

## 2018-09-21 DIAGNOSIS — O321XX2 Maternal care for breech presentation, fetus 2: Secondary | ICD-10-CM | POA: Diagnosis present

## 2018-09-21 DIAGNOSIS — O411231 Chorioamnionitis, third trimester, fetus 1: Secondary | ICD-10-CM | POA: Diagnosis present

## 2018-09-21 DIAGNOSIS — O1002 Pre-existing essential hypertension complicating childbirth: Secondary | ICD-10-CM | POA: Diagnosis present

## 2018-09-21 DIAGNOSIS — O322XX1 Maternal care for transverse and oblique lie, fetus 1: Secondary | ICD-10-CM | POA: Diagnosis present

## 2018-09-21 DIAGNOSIS — O34211 Maternal care for low transverse scar from previous cesarean delivery: Secondary | ICD-10-CM | POA: Diagnosis present

## 2018-09-21 DIAGNOSIS — O99212 Obesity complicating pregnancy, second trimester: Secondary | ICD-10-CM | POA: Diagnosis not present

## 2018-09-21 DIAGNOSIS — Z98891 History of uterine scar from previous surgery: Secondary | ICD-10-CM

## 2018-09-21 DIAGNOSIS — O42913 Preterm premature rupture of membranes, unspecified as to length of time between rupture and onset of labor, third trimester: Principal | ICD-10-CM | POA: Diagnosis present

## 2018-09-21 DIAGNOSIS — O34219 Maternal care for unspecified type scar from previous cesarean delivery: Secondary | ICD-10-CM | POA: Diagnosis not present

## 2018-09-21 DIAGNOSIS — O4290 Premature rupture of membranes, unspecified as to length of time between rupture and onset of labor, unspecified weeks of gestation: Secondary | ICD-10-CM | POA: Diagnosis present

## 2018-09-21 DIAGNOSIS — O26899 Other specified pregnancy related conditions, unspecified trimester: Secondary | ICD-10-CM

## 2018-09-21 DIAGNOSIS — O30049 Twin pregnancy, dichorionic/diamniotic, unspecified trimester: Secondary | ICD-10-CM | POA: Diagnosis present

## 2018-09-21 DIAGNOSIS — O99810 Abnormal glucose complicating pregnancy: Secondary | ICD-10-CM | POA: Diagnosis present

## 2018-09-21 DIAGNOSIS — O42013 Preterm premature rupture of membranes, onset of labor within 24 hours of rupture, third trimester: Secondary | ICD-10-CM | POA: Diagnosis not present

## 2018-09-21 DIAGNOSIS — O10013 Pre-existing essential hypertension complicating pregnancy, third trimester: Secondary | ICD-10-CM | POA: Diagnosis not present

## 2018-09-21 LAB — URINALYSIS, ROUTINE W REFLEX MICROSCOPIC
BILIRUBIN URINE: NEGATIVE
Glucose, UA: NEGATIVE mg/dL
HGB URINE DIPSTICK: NEGATIVE
Ketones, ur: 80 mg/dL — AB
NITRITE: NEGATIVE
Protein, ur: NEGATIVE mg/dL
SPECIFIC GRAVITY, URINE: 1.016 (ref 1.005–1.030)
pH: 6 (ref 5.0–8.0)

## 2018-09-21 LAB — CBC WITH DIFFERENTIAL/PLATELET
BASOS PCT: 0 %
Basophils Absolute: 0 10*3/uL (ref 0.0–0.1)
EOS ABS: 0 10*3/uL (ref 0.0–0.5)
Eosinophils Relative: 0 %
HEMATOCRIT: 33 % — AB (ref 36.0–46.0)
Hemoglobin: 10.2 g/dL — ABNORMAL LOW (ref 12.0–15.0)
LYMPHS ABS: 5.4 10*3/uL — AB (ref 0.7–4.0)
Lymphocytes Relative: 56 %
MCH: 29.1 pg (ref 26.0–34.0)
MCHC: 30.9 g/dL (ref 30.0–36.0)
MCV: 94.3 fL (ref 80.0–100.0)
MONO ABS: 0.8 10*3/uL (ref 0.1–1.0)
MONOS PCT: 8 %
Neutro Abs: 3.4 10*3/uL (ref 1.7–7.7)
Neutrophils Relative %: 35 %
Platelets: 271 10*3/uL (ref 150–400)
RBC: 3.5 MIL/uL — ABNORMAL LOW (ref 3.87–5.11)
RDW: 24.7 % — AB (ref 11.5–15.5)
WBC: 9.5 10*3/uL (ref 4.0–10.5)

## 2018-09-21 LAB — WET PREP, GENITAL
Clue Cells Wet Prep HPF POC: NONE SEEN
SPERM: NONE SEEN
Trich, Wet Prep: NONE SEEN
YEAST WET PREP: NONE SEEN

## 2018-09-21 LAB — OB RESULTS CONSOLE GC/CHLAMYDIA: GC PROBE AMP, GENITAL: NEGATIVE

## 2018-09-21 LAB — AMNISURE RUPTURE OF MEMBRANE (ROM) NOT AT ARMC: AMNISURE: POSITIVE

## 2018-09-21 MED ORDER — BETAMETHASONE SOD PHOS & ACET 6 (3-3) MG/ML IJ SUSP
12.0000 mg | INTRAMUSCULAR | Status: AC
  Administered 2018-09-21 – 2018-09-22 (×2): 12 mg via INTRAMUSCULAR
  Filled 2018-09-21 (×2): qty 2

## 2018-09-21 MED ORDER — LACTATED RINGERS IV SOLN
INTRAVENOUS | Status: DC
  Administered 2018-09-21 – 2018-09-22 (×3): via INTRAVENOUS

## 2018-09-21 MED ORDER — DOCUSATE SODIUM 100 MG PO CAPS
100.0000 mg | ORAL_CAPSULE | Freq: Every day | ORAL | Status: DC
  Administered 2018-09-21 – 2018-09-22 (×2): 100 mg via ORAL
  Filled 2018-09-21 (×3): qty 1

## 2018-09-21 MED ORDER — SODIUM CHLORIDE 0.9 % IV SOLN
2.0000 g | Freq: Four times a day (QID) | INTRAVENOUS | Status: DC
  Administered 2018-09-21 – 2018-09-22 (×6): 2 g via INTRAVENOUS
  Filled 2018-09-21: qty 2
  Filled 2018-09-21: qty 2000
  Filled 2018-09-21 (×3): qty 2
  Filled 2018-09-21: qty 2000
  Filled 2018-09-21 (×2): qty 2

## 2018-09-21 MED ORDER — AZITHROMYCIN 500 MG PO TABS
500.0000 mg | ORAL_TABLET | Freq: Every day | ORAL | Status: DC
  Administered 2018-09-21 – 2018-09-22 (×2): 500 mg via ORAL
  Filled 2018-09-21 (×2): qty 2
  Filled 2018-09-21: qty 1

## 2018-09-21 MED ORDER — CALCIUM CARBONATE ANTACID 500 MG PO CHEW: 2.0000 | CHEWABLE_TABLET | ORAL | Status: DC | PRN

## 2018-09-21 MED ORDER — ACETAMINOPHEN 325 MG PO TABS: 650.0000 mg | ORAL_TABLET | ORAL | Status: DC | PRN

## 2018-09-21 MED ORDER — PRENATAL MULTIVITAMIN CH
1.0000 | ORAL_TABLET | Freq: Every day | ORAL | Status: DC
  Administered 2018-09-21 – 2018-09-22 (×2): 1 via ORAL
  Filled 2018-09-21 (×3): qty 1

## 2018-09-21 MED ORDER — ZOLPIDEM TARTRATE 5 MG PO TABS: 5.0000 mg | ORAL_TABLET | Freq: Every evening | ORAL | Status: DC | PRN

## 2018-09-21 NOTE — Consult Note (Signed)
Asked by Dr.Stinson to provide prenatal consultation for patient at risk for preterm delivery due to PROM.  Mother is 32 y.o. G6 P5 who is now 33.[redacted] weeks EGA, with pregnancy complicated by dichorionic same-sex twins (female).  She is being treated with betamethasone and antibiotics (azithromycin and ampicillin).  Plans are to defer delivery until at least 34 wks if no signs of infection or other complications.  Discussed usual expectations for preterm infants at 42 -[redacted] weeks gestation, including possible needs for DR resuscitation, respiratory support, and IV access.  Projected possible length of stay in NICU until 37 - [redacted] wks EGA.  Discussed advantages of feeding with mother's milk.  She plans to pump postnatally.  Patient was attentive, had appropriate questions, and was appreciative of my input.  Thank you for consulting Neonatology.  Total time 25 minute, 20 minutes face-to-face.  JWimmer, MD

## 2018-09-21 NOTE — MAU Note (Signed)
Two unsuccessful IV attempts, One in L wrist, one in R hand.  2nd RN to attempt IV start.
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
## 2018-09-21 NOTE — MAU Note (Signed)
Pt came in by EMS, 33 week twins, states she thinks she started leaking fluid around midnight last night, has continued having some leaking throughout the night. Unsure if fluid is clear.  Having occasional contractions.  Reports good fetal movement.

## 2018-09-22 ENCOUNTER — Other Ambulatory Visit: Payer: Medicaid Other

## 2018-09-22 ENCOUNTER — Encounter: Payer: Medicaid Other | Admitting: Family Medicine

## 2018-09-22 ENCOUNTER — Inpatient Hospital Stay (HOSPITAL_BASED_OUTPATIENT_CLINIC_OR_DEPARTMENT_OTHER): Payer: Medicaid Other

## 2018-09-22 DIAGNOSIS — O4292 Full-term premature rupture of membranes, unspecified as to length of time between rupture and onset of labor: Secondary | ICD-10-CM

## 2018-09-22 DIAGNOSIS — O360130 Maternal care for anti-D [Rh] antibodies, third trimester, not applicable or unspecified: Secondary | ICD-10-CM

## 2018-09-22 DIAGNOSIS — O99212 Obesity complicating pregnancy, second trimester: Secondary | ICD-10-CM

## 2018-09-22 DIAGNOSIS — O10013 Pre-existing essential hypertension complicating pregnancy, third trimester: Secondary | ICD-10-CM

## 2018-09-22 DIAGNOSIS — Z3A33 33 weeks gestation of pregnancy: Secondary | ICD-10-CM

## 2018-09-22 DIAGNOSIS — O34219 Maternal care for unspecified type scar from previous cesarean delivery: Secondary | ICD-10-CM

## 2018-09-22 LAB — GC/CHLAMYDIA PROBE AMP (~~LOC~~) NOT AT ARMC
Chlamydia: NEGATIVE
Neisseria Gonorrhea: NEGATIVE

## 2018-09-22 MED ORDER — AMOXICILLIN 500 MG PO CAPS
500.0000 mg | ORAL_CAPSULE | Freq: Three times a day (TID) | ORAL | Status: DC
  Filled 2018-09-22: qty 1

## 2018-09-22 MED ORDER — SODIUM CHLORIDE 0.9 % IV SOLN
INTRAVENOUS | Status: DC | PRN
  Administered 2018-09-22: 500 mL via INTRAVENOUS

## 2018-09-22 NOTE — Progress Notes (Signed)
Pt off unit to MFM for U/S.

## 2018-09-22 NOTE — Progress Notes (Signed)
Patient ID: [name] [name], female   DOB: 05-Jul-1986, 32 y.o.   MRN: [id number] [place] ANTEPARTUM COMPREHENSIVE PROGRESS NOTE  [name] [name] is a 32 y.o. [id number] at [redacted]w[redacted]d  who is admitted for PROM, di/di twins Fetal presentation is breech x 2 Length of Stay:  1  Days  Subjective: Pt without complaints this morning. Reports + FM x 2. Still some leaking of fluid. Denies ut ctx or VB. Tolerating diet.   Vitals:  Blood pressure 122/74, pulse (!) 106, temperature 98.2 F (36.8 C), temperature source Oral, resp. rate 18, height 5\' 2"  (1.575 m), weight 116.6 kg, last menstrual period 02/01/2018, SpO2 100 %, unknown if currently breastfeeding.   Physical Examination: Lungs clear Heart RRR Abd soft + BS gravid non tender Ext non tender  Fetal Monitoring:  130-140's x 2, + acels, no toco  Labs:  Results for orders placed or performed during the hospital encounter of 09/21/18 (from the past 24 hour(s))  Urinalysis, Routine w reflex microscopic   Collection Time: 09/21/18  9:34 AM  Result Value Ref Range   Color, Urine YELLOW YELLOW   APPearance HAZY (A) CLEAR   Specific Gravity, Urine 1.016 1.005 - 1.030   pH 6.0 5.0 - 8.0   Glucose, UA NEGATIVE NEGATIVE mg/dL   Hgb urine dipstick NEGATIVE NEGATIVE   Bilirubin Urine NEGATIVE NEGATIVE   Ketones, ur 80 (A) NEGATIVE mg/dL   Protein, ur NEGATIVE NEGATIVE mg/dL   Nitrite NEGATIVE NEGATIVE   Leukocytes, UA TRACE (A) NEGATIVE   RBC / HPF 0-5 0 - 5 RBC/hpf   WBC, UA 21-50 0 - 5 WBC/hpf   Bacteria, UA RARE (A) NONE SEEN   Squamous Epithelial / LPF 0-5 0 - 5   Mucus PRESENT   Wet prep, genital   Collection Time: 09/21/18  9:45 AM  Result Value Ref Range   Yeast Wet Prep HPF POC NONE SEEN NONE SEEN   Trich, Wet Prep NONE SEEN NONE SEEN   Clue Cells Wet Prep HPF POC NONE SEEN NONE SEEN   WBC, Wet Prep HPF POC FEW (A) NONE SEEN   Sperm NONE SEEN   Amnisure rupture of membrane (rom)not at [facility]   Collection Time: 09/21/18  9:45 AM   Result Value Ref Range   Amnisure ROM POSITIVE   CBC with Differential/Platelet   Collection Time: 09/21/18 10:29 AM  Result Value Ref Range   WBC 9.5 4.0 - 10.5 K/uL   RBC 3.50 (L) 3.87 - 5.11 MIL/uL   Hemoglobin 10.2 (L) 12.0 - 15.0 g/dL   HCT 33.0 (L) 36.0 - 46.0 %   MCV 94.3 80.0 - 100.0 fL   MCH 29.1 26.0 - 34.0 pg   MCHC 30.9 30.0 - 36.0 g/dL   RDW 24.7 (H) 11.5 - 15.5 %   Platelets 271 150 - 400 K/uL   Neutrophils Relative % 35 %   Neutro Abs 3.4 1.7 - 7.7 K/uL   Lymphocytes Relative 56 %   Lymphs Abs 5.4 (H) 0.7 - 4.0 K/uL   Monocytes Relative 8 %   Monocytes Absolute 0.8 0.1 - 1.0 K/uL   Eosinophils Relative 0 %   Eosinophils Absolute 0.0 0.0 - 0.5 K/uL   Basophils Relative 0 %   Basophils Absolute 0.0 0.0 - 0.1 K/uL  Type and screen [place]   Collection Time: 09/21/18 10:29 AM  Result Value Ref Range   ABO/RH(D) A NEG    Antibody Screen POS    Sample Expiration  09/24/2018    Antibody Identification PASSIVELY ACQUIRED ANTI-D    Unit Number [id number]    Blood Component Type RED CELLS,LR    Unit division 00    Status of Unit ALLOCATED    Transfusion Status OK TO TRANSFUSE    Crossmatch Result COMPATIBLE    Unit Number [id number]    Blood Component Type RBC LR PHER1    Unit division 00    Status of Unit ALLOCATED    Transfusion Status OK TO TRANSFUSE    Crossmatch Result COMPATIBLE   BPAM RBC   Collection Time: 09/21/18 10:29 AM  Result Value Ref Range   Blood Product Unit Number [id number]    Unit Type and Rh 0600    Blood Product Expiration Date [id number]    Blood Product Unit Number [id number]    Unit Type and Rh 0600    Blood Product Expiration Date [id number]     Imaging Studies:    NA   Medications:  Scheduled . azithromycin  500 mg Oral Daily  . betamethasone acetate-betamethasone sodium phosphate  12 mg Intramuscular Q24 Hr x 2  . docusate sodium  100 mg Oral Daily  . prenatal multivitamin  1 tablet  Oral Q1200   I have reviewed the patient's current medications.  ASSESSMENT: IUP 30 2/7 weeks DI/Di twins PROM  CHTN RH negative Prior c section with successful VBAC x 4   PLAN: Stable. To complete BMZ today. Continue with latency antibiotics. U/S today for presentation and AFI. No S/Sx of infection. Delivery for maternal or fetal indications.  Continue routine antenatal care.   Chancy Milroy 09/22/2018,9:12 AM

## 2018-09-23 ENCOUNTER — Inpatient Hospital Stay (HOSPITAL_COMMUNITY): Payer: Medicaid Other | Admitting: Anesthesiology

## 2018-09-23 ENCOUNTER — Encounter (HOSPITAL_COMMUNITY)
Admission: AD | Disposition: A | Discharge status: Home / Self Care | Payer: Self-pay | Source: Home / Self Care | Attending: Obstetrics & Gynecology

## 2018-09-23 ENCOUNTER — Encounter (HOSPITAL_COMMUNITY): Payer: Self-pay | Admitting: *Deleted

## 2018-09-23 ENCOUNTER — Other Ambulatory Visit (HOSPITAL_COMMUNITY): Payer: Medicaid Other

## 2018-09-23 DIAGNOSIS — O321XX0 Maternal care for breech presentation, not applicable or unspecified: Secondary | ICD-10-CM

## 2018-09-23 DIAGNOSIS — O30043 Twin pregnancy, dichorionic/diamniotic, third trimester: Secondary | ICD-10-CM

## 2018-09-23 DIAGNOSIS — Z3A33 33 weeks gestation of pregnancy: Secondary | ICD-10-CM

## 2018-09-23 DIAGNOSIS — O42013 Preterm premature rupture of membranes, onset of labor within 24 hours of rupture, third trimester: Secondary | ICD-10-CM

## 2018-09-23 LAB — CREATININE, SERUM: Creatinine, Ser: 0.61 mg/dL (ref 0.44–1.00)

## 2018-09-23 LAB — CBC
HCT: 28.9 % — ABNORMAL LOW (ref 36.0–46.0)
HEMATOCRIT: 30.4 % — AB (ref 36.0–46.0)
HEMOGLOBIN: 9.6 g/dL — AB (ref 12.0–15.0)
Hemoglobin: 9.3 g/dL — ABNORMAL LOW (ref 12.0–15.0)
MCH: 30.4 pg (ref 26.0–34.0)
MCH: 29.8 pg (ref 26.0–34.0)
MCHC: 32.2 g/dL (ref 30.0–36.0)
MCHC: 31.6 g/dL (ref 30.0–36.0)
MCV: 94.4 fL (ref 80.0–100.0)
MCV: 94.4 fL (ref 80.0–100.0)
PLATELETS: 267 10*3/uL (ref 150–400)
Platelets: 267 10*3/uL (ref 150–400)
RBC: 3.06 MIL/uL — ABNORMAL LOW (ref 3.87–5.11)
RBC: 3.22 MIL/uL — ABNORMAL LOW (ref 3.87–5.11)
RDW: 24.1 % — ABNORMAL HIGH (ref 11.5–15.5)
RDW: 24 % — ABNORMAL HIGH (ref 11.5–15.5)
WBC: 14.3 10*3/uL — AB (ref 4.0–10.5)
WBC: 11 10*3/uL — AB (ref 4.0–10.5)
nRBC: 0 % (ref 0.0–0.2)
nRBC: 0.6 % — ABNORMAL HIGH (ref 0.0–0.2)

## 2018-09-23 LAB — COMPREHENSIVE METABOLIC PANEL
ALBUMIN: 3.2 g/dL — AB (ref 3.5–5.0)
ALT: 17 U/L (ref 0–44)
ANION GAP: 11 (ref 5–15)
AST: 18 U/L (ref 15–41)
Alkaline Phosphatase: 85 U/L (ref 38–126)
BUN: 8 mg/dL (ref 6–20)
CO2: 22 mmol/L (ref 22–32)
Calcium: 8.7 mg/dL — ABNORMAL LOW (ref 8.9–10.3)
Chloride: 104 mmol/L (ref 98–111)
Creatinine, Ser: 0.52 mg/dL (ref 0.44–1.00)
GFR calc Af Amer: >60 mL/min (ref >60)
GFR calc non Af Amer: >60 mL/min (ref >60)
GLUCOSE: 108 mg/dL — AB (ref 70–99)
Potassium: 3.4 mmol/L — ABNORMAL LOW (ref 3.5–5.1)
SODIUM: 137 mmol/L (ref 135–145)
TOTAL PROTEIN: 6.9 g/dL (ref 6.5–8.1)
Total Bilirubin: 0.5 mg/dL (ref 0.3–1.2)

## 2018-09-23 LAB — CULTURE, BETA STREP (GROUP B ONLY)

## 2018-09-23 LAB — PROTEIN / CREATININE RATIO, URINE
Creatinine, Urine: 55 mg/dL
PROTEIN CREATININE RATIO: 0.15 mg/mg{creat} (ref 0.00–0.15)
Total Protein, Urine: 8 mg/dL

## 2018-09-23 SURGERY — Surgical Case
Anesthesia: Epidural

## 2018-09-23 MED ORDER — LACTATED RINGERS IV SOLN
500.0000 mL | INTRAVENOUS | Status: DC | PRN
  Administered 2018-09-23: 1000 mL via INTRAVENOUS

## 2018-09-23 MED ORDER — SODIUM CHLORIDE 0.9 % IV SOLN
5.0000 10*6.[IU] | Freq: Once | INTRAVENOUS | Status: DC
  Filled 2018-09-23: qty 5

## 2018-09-23 MED ORDER — OXYTOCIN 10 UNIT/ML IJ SOLN
INTRAMUSCULAR | Status: AC
  Filled 2018-09-23: qty 4

## 2018-09-23 MED ORDER — SCOPOLAMINE 1 MG/3DAYS TD PT72
MEDICATED_PATCH | TRANSDERMAL | Status: AC
  Filled 2018-09-23: qty 1

## 2018-09-23 MED ORDER — OXYCODONE HCL 5 MG PO TABS
10.0000 mg | ORAL_TABLET | ORAL | Status: DC | PRN
  Administered 2018-09-23 – 2018-09-27 (×12): 10 mg via ORAL
  Filled 2018-09-23 (×12): qty 2

## 2018-09-23 MED ORDER — TETANUS-DIPHTH-ACELL PERTUSSIS 5-2.5-18.5 LF-MCG/0.5 IM SUSP: 0.5000 mL | Freq: Once | INTRAMUSCULAR | Status: DC

## 2018-09-23 MED ORDER — ENOXAPARIN SODIUM 60 MG/0.6ML ~~LOC~~ SOLN
60.0000 mg | SUBCUTANEOUS | Status: DC
  Administered 2018-09-24 – 2018-09-27 (×4): 60 mg via SUBCUTANEOUS
  Filled 2018-09-23 (×5): qty 0.6

## 2018-09-23 MED ORDER — SIMETHICONE 80 MG PO CHEW
80.0000 mg | CHEWABLE_TABLET | Freq: Three times a day (TID) | ORAL | Status: DC
  Administered 2018-09-23 – 2018-09-27 (×11): 80 mg via ORAL
  Filled 2018-09-23 (×10): qty 1

## 2018-09-23 MED ORDER — CEFAZOLIN SODIUM-DEXTROSE 2-4 GM/100ML-% IV SOLN
2.0000 g | Freq: Once | INTRAVENOUS | Status: AC
  Administered 2018-09-23: 2 g via INTRAVENOUS

## 2018-09-23 MED ORDER — DIPHENHYDRAMINE HCL 50 MG/ML IJ SOLN: 12.5000 mg | INTRAMUSCULAR | Status: DC | PRN

## 2018-09-23 MED ORDER — PENICILLIN G 3 MILLION UNITS IVPB - SIMPLE MED: 3.0000 10*6.[IU] | INTRAVENOUS | Status: DC

## 2018-09-23 MED ORDER — LACTATED RINGERS IV SOLN
INTRAVENOUS | Status: DC
  Administered 2018-09-23: 06:00:00 via INTRAVENOUS

## 2018-09-23 MED ORDER — OXYCODONE-ACETAMINOPHEN 5-325 MG PO TABS: 1.0000 | ORAL_TABLET | ORAL | Status: DC | PRN

## 2018-09-23 MED ORDER — ONDANSETRON HCL 4 MG/2ML IJ SOLN
INTRAMUSCULAR | Status: AC
  Filled 2018-09-23: qty 2

## 2018-09-23 MED ORDER — SOD CITRATE-CITRIC ACID 500-334 MG/5ML PO SOLN
30.0000 mL | ORAL | Status: DC | PRN
  Administered 2018-09-23: 30 mL via ORAL
  Filled 2018-09-23: qty 15

## 2018-09-23 MED ORDER — CEFAZOLIN SODIUM-DEXTROSE 2-4 GM/100ML-% IV SOLN: 2.0000 g | INTRAVENOUS | Status: DC

## 2018-09-23 MED ORDER — PHENYLEPHRINE 40 MCG/ML (10ML) SYRINGE FOR IV PUSH (FOR BLOOD PRESSURE SUPPORT)
PREFILLED_SYRINGE | INTRAVENOUS | Status: AC
  Filled 2018-09-23: qty 10

## 2018-09-23 MED ORDER — SIMETHICONE 80 MG PO CHEW
80.0000 mg | CHEWABLE_TABLET | ORAL | Status: DC | PRN
  Administered 2018-09-25: 80 mg via ORAL
  Filled 2018-09-23: qty 1

## 2018-09-23 MED ORDER — MENTHOL 3 MG MT LOZG: 1.0000 | LOZENGE | OROMUCOSAL | Status: DC | PRN

## 2018-09-23 MED ORDER — VALACYCLOVIR HCL 500 MG PO TABS
1000.0000 mg | ORAL_TABLET | Freq: Every day | ORAL | Status: DC
  Filled 2018-09-23 (×2): qty 2

## 2018-09-23 MED ORDER — KETOROLAC TROMETHAMINE 30 MG/ML IJ SOLN
INTRAMUSCULAR | Status: AC
  Administered 2018-09-23: 30 mg via INTRAMUSCULAR
  Filled 2018-09-23: qty 1

## 2018-09-23 MED ORDER — EPHEDRINE 5 MG/ML INJ: 10.0000 mg | INTRAVENOUS | Status: DC | PRN

## 2018-09-23 MED ORDER — FENTANYL CITRATE (PF) 100 MCG/2ML IJ SOLN
INTRAMUSCULAR | Status: AC
  Filled 2018-09-23: qty 2

## 2018-09-23 MED ORDER — FENTANYL CITRATE (PF) 100 MCG/2ML IJ SOLN: 100.0000 ug | INTRAMUSCULAR | Status: DC | PRN

## 2018-09-23 MED ORDER — IBUPROFEN 600 MG PO TABS
600.0000 mg | ORAL_TABLET | Freq: Four times a day (QID) | ORAL | Status: DC
  Administered 2018-09-23 – 2018-09-27 (×16): 600 mg via ORAL
  Filled 2018-09-23 (×16): qty 1

## 2018-09-23 MED ORDER — OXYTOCIN 10 UNIT/ML IJ SOLN
INTRAVENOUS | Status: DC | PRN
  Administered 2018-09-23: 40 [IU] via INTRAVENOUS

## 2018-09-23 MED ORDER — FENTANYL CITRATE (PF) 100 MCG/2ML IJ SOLN
50.0000 ug | Freq: Once | INTRAMUSCULAR | Status: AC
  Administered 2018-09-23: 50 ug via INTRAVENOUS
  Filled 2018-09-23: qty 2

## 2018-09-23 MED ORDER — LIDOCAINE-EPINEPHRINE (PF) 2 %-1:200000 IJ SOLN
INTRAMUSCULAR | Status: AC
  Filled 2018-09-23: qty 20

## 2018-09-23 MED ORDER — FENTANYL CITRATE (PF) 100 MCG/2ML IJ SOLN
25.0000 ug | INTRAMUSCULAR | Status: DC | PRN
  Administered 2018-09-23: 50 ug via INTRAVENOUS

## 2018-09-23 MED ORDER — TERBUTALINE SULFATE 1 MG/ML IJ SOLN
INTRAMUSCULAR | Status: AC
  Filled 2018-09-23: qty 1

## 2018-09-23 MED ORDER — ACETAMINOPHEN 500 MG PO TABS
1000.0000 mg | ORAL_TABLET | Freq: Four times a day (QID) | ORAL | Status: DC | PRN
  Administered 2018-09-23: 1000 mg via ORAL
  Filled 2018-09-23: qty 2

## 2018-09-23 MED ORDER — MORPHINE SULFATE (PF) 0.5 MG/ML IJ SOLN
INTRAMUSCULAR | Status: AC
  Filled 2018-09-23: qty 10

## 2018-09-23 MED ORDER — OXYTOCIN 40 UNITS IN LACTATED RINGERS INFUSION - SIMPLE MED: 2.5000 [IU]/h | INTRAVENOUS | Status: DC

## 2018-09-23 MED ORDER — ACETAMINOPHEN 325 MG PO TABS
650.0000 mg | ORAL_TABLET | ORAL | Status: DC | PRN
  Administered 2018-09-23: 650 mg via ORAL
  Filled 2018-09-23: qty 2

## 2018-09-23 MED ORDER — KETOROLAC TROMETHAMINE 30 MG/ML IJ SOLN
30.0000 mg | Freq: Once | INTRAMUSCULAR | Status: AC
  Administered 2018-09-23: 30 mg via INTRAMUSCULAR

## 2018-09-23 MED ORDER — TERBUTALINE SULFATE 1 MG/ML IJ SOLN
0.2500 mg | Freq: Once | INTRAMUSCULAR | Status: AC
  Administered 2018-09-23: 0.25 mg via SUBCUTANEOUS

## 2018-09-23 MED ORDER — DEXAMETHASONE SODIUM PHOSPHATE 4 MG/ML IJ SOLN
INTRAMUSCULAR | Status: AC
  Filled 2018-09-23: qty 1

## 2018-09-23 MED ORDER — LACTATED RINGERS IV SOLN
INTRAVENOUS | Status: DC
  Administered 2018-09-23: 19:00:00 via INTRAVENOUS

## 2018-09-23 MED ORDER — FENTANYL 2.5 MCG/ML BUPIVACAINE 1/10 % EPIDURAL INFUSION (WH - ANES)
14.0000 mL/h | INTRAMUSCULAR | Status: DC | PRN
  Administered 2018-09-23: 14 mL/h via EPIDURAL
  Filled 2018-09-23: qty 100

## 2018-09-23 MED ORDER — WITCH HAZEL-GLYCERIN EX PADS: 1.0000 "application " | MEDICATED_PAD | CUTANEOUS | Status: DC | PRN

## 2018-09-23 MED ORDER — COCONUT OIL OIL: 1.0000 "application " | TOPICAL_OIL | Status: DC | PRN

## 2018-09-23 MED ORDER — OXYCODONE-ACETAMINOPHEN 5-325 MG PO TABS: 2.0000 | ORAL_TABLET | ORAL | Status: DC | PRN

## 2018-09-23 MED ORDER — PRENATAL MULTIVITAMIN CH
1.0000 | ORAL_TABLET | Freq: Every day | ORAL | Status: DC
  Administered 2018-09-24 – 2018-09-27 (×3): 1 via ORAL
  Filled 2018-09-23 (×3): qty 1

## 2018-09-23 MED ORDER — LIDOCAINE-EPINEPHRINE (PF) 2 %-1:200000 IJ SOLN
INTRAMUSCULAR | Status: DC | PRN
  Administered 2018-09-23: 4 mL via EPIDURAL
  Administered 2018-09-23: 3 mL via EPIDURAL

## 2018-09-23 MED ORDER — ACETAMINOPHEN 325 MG PO TABS: 650.0000 mg | ORAL_TABLET | ORAL | Status: DC | PRN

## 2018-09-23 MED ORDER — PHENYLEPHRINE 40 MCG/ML (10ML) SYRINGE FOR IV PUSH (FOR BLOOD PRESSURE SUPPORT): 80.0000 ug | PREFILLED_SYRINGE | INTRAVENOUS | Status: DC | PRN

## 2018-09-23 MED ORDER — PHENYLEPHRINE HCL 10 MG/ML IJ SOLN: INTRAMUSCULAR | Status: DC | PRN

## 2018-09-23 MED ORDER — PHENYLEPHRINE 8 MG IN D5W 100 ML (0.08MG/ML) PREMIX OPTIME
INJECTION | INTRAVENOUS | Status: DC | PRN
  Administered 2018-09-23: 45 ug/min via INTRAVENOUS

## 2018-09-23 MED ORDER — ONDANSETRON HCL 4 MG/2ML IJ SOLN
INTRAMUSCULAR | Status: DC | PRN
  Administered 2018-09-23: 4 mg via INTRAVENOUS

## 2018-09-23 MED ORDER — SIMETHICONE 80 MG PO CHEW
80.0000 mg | CHEWABLE_TABLET | ORAL | Status: DC
  Administered 2018-09-23 – 2018-09-27 (×3): 80 mg via ORAL
  Filled 2018-09-23 (×4): qty 1

## 2018-09-23 MED ORDER — LACTATED RINGERS IV SOLN
INTRAVENOUS | Status: DC | PRN
  Administered 2018-09-23: 11:00:00 via INTRAVENOUS

## 2018-09-23 MED ORDER — OXYTOCIN BOLUS FROM INFUSION: 500.0000 mL | Freq: Once | INTRAVENOUS | Status: DC

## 2018-09-23 MED ORDER — OXYCODONE HCL 5 MG PO TABS
5.0000 mg | ORAL_TABLET | ORAL | Status: DC | PRN
  Administered 2018-09-25 (×2): 5 mg via ORAL
  Filled 2018-09-23 (×2): qty 1

## 2018-09-23 MED ORDER — FENTANYL CITRATE (PF) 100 MCG/2ML IJ SOLN
INTRAMUSCULAR | Status: AC
  Administered 2018-09-23: 50 ug via INTRAVENOUS
  Filled 2018-09-23: qty 2

## 2018-09-23 MED ORDER — DIPHENHYDRAMINE HCL 25 MG PO CAPS: 25.0000 mg | ORAL_CAPSULE | Freq: Four times a day (QID) | ORAL | Status: DC | PRN

## 2018-09-23 MED ORDER — DIBUCAINE 1 % RE OINT: 1.0000 "application " | TOPICAL_OINTMENT | RECTAL | Status: DC | PRN

## 2018-09-23 MED ORDER — LIDOCAINE HCL (PF) 1 % IJ SOLN: 30.0000 mL | INTRAMUSCULAR | Status: DC | PRN

## 2018-09-23 MED ORDER — LACTATED RINGERS IV SOLN
500.0000 mL | Freq: Once | INTRAVENOUS | Status: AC
  Administered 2018-09-23: 500 mL via INTRAVENOUS

## 2018-09-23 MED ORDER — ACETAMINOPHEN 10 MG/ML IV SOLN
1000.0000 mg | Freq: Once | INTRAVENOUS | Status: AC
  Administered 2018-09-23: 1000 mg via INTRAVENOUS

## 2018-09-23 MED ORDER — PHENYLEPHRINE 40 MCG/ML (10ML) SYRINGE FOR IV PUSH (FOR BLOOD PRESSURE SUPPORT)
80.0000 ug | PREFILLED_SYRINGE | INTRAVENOUS | Status: DC | PRN
  Filled 2018-09-23 (×2): qty 10

## 2018-09-23 MED ORDER — ONDANSETRON HCL 4 MG/2ML IJ SOLN: 4.0000 mg | Freq: Four times a day (QID) | INTRAMUSCULAR | Status: DC | PRN

## 2018-09-23 MED ORDER — ACETAMINOPHEN 10 MG/ML IV SOLN
INTRAVENOUS | Status: AC
  Administered 2018-09-23: 1000 mg via INTRAVENOUS
  Filled 2018-09-23: qty 100

## 2018-09-23 MED ORDER — OXYTOCIN 40 UNITS IN LACTATED RINGERS INFUSION - SIMPLE MED: 2.5000 [IU]/h | INTRAVENOUS | Status: AC

## 2018-09-23 MED ORDER — SENNOSIDES-DOCUSATE SODIUM 8.6-50 MG PO TABS
2.0000 | ORAL_TABLET | ORAL | Status: DC
  Administered 2018-09-23 – 2018-09-27 (×4): 2 via ORAL
  Filled 2018-09-23 (×4): qty 2

## 2018-09-23 SURGICAL SUPPLY — 36 items
APL SKNCLS STERI-STRIP NONHPOA (GAUZE/BANDAGES/DRESSINGS) ×1
BENZOIN TINCTURE PRP APPL 2/3 (GAUZE/BANDAGES/DRESSINGS) ×1 IMPLANT
BRR ADH 6X5 SEPRAFILM 1 SHT (MISCELLANEOUS)
CHLORAPREP W/TINT 26ML (MISCELLANEOUS) ×2 IMPLANT
CLAMP CORD UMBIL (MISCELLANEOUS) IMPLANT
COVER LIGHT HANDLE  1/PK (MISCELLANEOUS) ×1
COVER LIGHT HANDLE 1/PK (MISCELLANEOUS) IMPLANT
DRESSING PREVENA PLUS CUSTOM (GAUZE/BANDAGES/DRESSINGS) IMPLANT
DRSG OPSITE POSTOP 4X10 (GAUZE/BANDAGES/DRESSINGS) ×2 IMPLANT
DRSG PREVENA PLUS CUSTOM (GAUZE/BANDAGES/DRESSINGS) ×2
ELECT REM PT RETURN 9FT ADLT (ELECTROSURGICAL) ×2
ELECTRODE REM PT RTRN 9FT ADLT (ELECTROSURGICAL) ×1 IMPLANT
EXTRACTOR VACUUM M CUP 4 TUBE (SUCTIONS) IMPLANT
GLOVE BIOGEL PI IND STRL 6.5 (GLOVE) ×1 IMPLANT
GLOVE BIOGEL PI IND STRL 7.0 (GLOVE) ×1 IMPLANT
GLOVE BIOGEL PI INDICATOR 6.5 (GLOVE) ×1
GLOVE BIOGEL PI INDICATOR 7.0 (GLOVE) ×1
GLOVE SURG SS PI 6.0 STRL IVOR (GLOVE) ×2 IMPLANT
GOWN STRL REUS W/TWL LRG LVL3 (GOWN DISPOSABLE) ×4 IMPLANT
KIT ABG SYR 3ML LUER SLIP (SYRINGE) IMPLANT
NDL HYPO 25X5/8 SAFETYGLIDE (NEEDLE) IMPLANT
NEEDLE HYPO 25X5/8 SAFETYGLIDE (NEEDLE) IMPLANT
NS IRRIG 1000ML POUR BTL (IV SOLUTION) ×2 IMPLANT
PACK C SECTION WH (CUSTOM PROCEDURE TRAY) ×2 IMPLANT
PAD OB MATERNITY 4.3X12.25 (PERSONAL CARE ITEMS) ×2 IMPLANT
PENCIL SMOKE EVAC W/HOLSTER (ELECTROSURGICAL) ×2 IMPLANT
RTRCTR C-SECT PINK 25CM LRG (MISCELLANEOUS) IMPLANT
SEPRAFILM MEMBRANE 5X6 (MISCELLANEOUS) IMPLANT
SPONGE LAP 18X18 RF (DISPOSABLE) ×6 IMPLANT
SUT PLAIN 0 NONE (SUTURE) IMPLANT
SUT PLAIN 2 0 XLH (SUTURE) ×1 IMPLANT
SUT VIC AB 0 CT1 36 (SUTURE) ×8 IMPLANT
SUT VIC AB 4-0 KS 27 (SUTURE) ×2 IMPLANT
TAPE STRIPS DRAPE STRL (GAUZE/BANDAGES/DRESSINGS) ×1 IMPLANT
TOWEL OR 17X24 6PK STRL BLUE (TOWEL DISPOSABLE) ×2 IMPLANT
TRAY FOLEY W/BAG SLVR 14FR LF (SET/KITS/TRAYS/PACK) ×2 IMPLANT

## 2018-09-23 NOTE — Anesthesia Procedure Notes (Signed)
Epidural Patient location during procedure: OB Start time: 09/23/2018 6:50 AM End time: 09/23/2018 7:10 AM  Staffing Anesthesiologist: Freddrick March, MD Performed: anesthesiologist   Preanesthetic Checklist Completed: patient identified, pre-op evaluation, timeout performed, IV checked, risks and benefits discussed and monitors and equipment checked  Epidural Patient position: sitting Prep: site prepped and draped and DuraPrep Patient monitoring: continuous pulse ox, blood pressure, heart rate and cardiac monitor Approach: midline Location: L3-L4 Injection technique: LOR air  Needle:  Needle type: Tuohy  Needle gauge: 17 G Needle length: 9 cm Needle insertion depth: 9 cm Catheter type: closed end flexible Catheter size: 19 Gauge Catheter at skin depth: 15 cm Test dose: negative  Assessment Sensory level: T8 Events: blood not aspirated, injection not painful, no injection resistance, negative IV test and no paresthesia  Additional Notes Patient identified. Risks/Benefits/Options discussed with patient including but not limited to bleeding, infection, nerve damage, paralysis, failed block, incomplete pain control, headache, blood pressure changes, nausea, vomiting, reactions to medication both or allergic, itching and postpartum back pain. Confirmed with bedside nurse the patient's most recent platelet count. Confirmed with patient that they are not currently taking any anticoagulation, have any bleeding history or any family history of bleeding disorders. Patient expressed understanding and wished to proceed. All questions were answered. Sterile technique was used throughout the entire procedure. Please see nursing notes for vital signs. Test dose was given through epidural catheter and negative prior to continuing to dose epidural or start infusion. Warning signs of high block given to the patient including shortness of breath, tingling/numbness in hands, complete motor block,  or any concerning symptoms with instructions to call for help. Patient was given instructions on fall risk and not to get out of bed. All questions and concerns addressed with instructions to call with any issues or inadequate analgesia.  Reason for block:procedure for pain

## 2018-09-23 NOTE — Progress Notes (Signed)
Pt declined to have DEBP set up at this time. She states she will call when she is ready. RN will recheck again later.

## 2018-09-23 NOTE — Progress Notes (Signed)
Baby was put on monitor. After 10 minutes babies HR remained in 175, 180's. Dr. Roselie Awkward was notified and continuous monitoring was ordered.

## 2018-09-23 NOTE — Progress Notes (Signed)
CSW acknowledged consult and is aware of custody concerns.  CSW will complete full assessment with MOB after L&D.  Laurey Arrow, MSW, LCSW Clinical Social Work [phone]

## 2018-09-23 NOTE — Progress Notes (Signed)
[name] [name] is a 32 y.o. G6P5005 at [redacted]w[redacted]d by ultrasound admitted for Preterm labor, PROM  Subjective:   Objective: BP 119/63 (BP Location: Left Arm)   Pulse (!) 123   Temp 99.3 F (37.4 C) (Oral)   Resp (!) 22   Ht 5\' 2"  (1.575 m)   Wt 116.6 kg   LMP 02/01/2018   SpO2 100%   BMI 47.02 kg/m  I/O last 3 completed shifts: In: 953.2 [I.V.:0.9; IV Piggyback:952.3] Out: -  No intake/output data recorded.   SVE:   Dilation: 4 Effacement (%): 50 Station: Ballotable Exam by:: Dory Larsen, RN Bedside US twin A cephalic Labs: Lab Results  Component Value Date   WBC 9.5 09/21/2018   HGB 10.2 (L) 09/21/2018   HCT 33.0 (L) 09/21/2018   MCV 94.3 09/21/2018   PLT 271 09/21/2018    Assessment / Plan: Preterm labor  Labor: Pretem labor Preeclampsia:  no signs or symptoms of toxicity Fetal Wellbeing:  Category II Pain Control:  Epidural I/D:  PCN GBS pending Anticipated MOD:  NSVD  Emeterio Reeve 09/23/2018, 5:06 AM

## 2018-09-23 NOTE — Anesthesia Preprocedure Evaluation (Signed)
Anesthesia Evaluation  Patient identified by MRN, date of birth, ID band Patient awake    Reviewed: Allergy & Precautions, NPO status , Patient's Chart, lab work & pertinent test results  Airway Mallampati: II  TM Distance: >3 FB Neck ROM: Full    Dental no notable dental hx. (+) Teeth Intact   Pulmonary    Pulmonary exam normal breath sounds clear to auscultation       Cardiovascular hypertension, Normal cardiovascular exam Rhythm:Regular Rate:Normal     Neuro/Psych  Headaches, negative psych ROS   GI/Hepatic negative GI ROS, Neg liver ROS,   Endo/Other  Morbid obesity  Renal/GU negative Renal ROS  negative genitourinary   Musculoskeletal negative musculoskeletal ROS (+)   Abdominal   Peds  Hematology negative hematology ROS (+) anemia ,   Anesthesia Other Findings Di/di twins  Reproductive/Obstetrics                             Anesthesia Physical Anesthesia Plan  ASA: III  Anesthesia Plan: Epidural   Post-op Pain Management:    Induction:   PONV Risk Score and Plan: Treatment may vary due to age or medical condition  Airway Management Planned: Natural Airway  Additional Equipment:   Intra-op Plan:   Post-operative Plan:   Informed Consent: I have reviewed the patients History and Physical, chart, labs and discussed the procedure including the risks, benefits and alternatives for the proposed anesthesia with the patient or authorized representative who has indicated his/her understanding and acceptance.     Plan Discussed with: Anesthesiologist  Anesthesia Plan Comments: (Patient identified. Risks, benefits, options discussed with patient including but not limited to bleeding, infection, nerve damage, paralysis, failed block, incomplete pain control, headache, blood pressure changes, nausea, vomiting, reactions to medication, itching, and post partum back pain. Confirmed  with bedside nurse the patient's most recent platelet count. Confirmed with the patient that they are not taking any anticoagulation, have any bleeding history or any family history of bleeding disorders. Patient expressed understanding and wishes to proceed. All questions were answered. )        Anesthesia Quick Evaluation

## 2018-09-23 NOTE — Anesthesia Postprocedure Evaluation (Signed)
Anesthesia Post Note  Patient: [name] [name]  Procedure(s) Performed: CESAREAN SECTION (N/A )     Patient location during evaluation: PACU Anesthesia Type: Epidural Level of consciousness: awake and alert Pain management: pain level controlled Vital Signs Assessment: post-procedure vital signs reviewed and stable Respiratory status: spontaneous breathing, nonlabored ventilation and respiratory function stable Cardiovascular status: stable Postop Assessment: no headache, no backache and epidural receding Anesthetic complications: no    Last Vitals:  Vitals:   09/23/18 1334 09/23/18 1345  BP: 135/85 (!) 142/84  Pulse: (!) 108 (!) 112  Resp: (!) 23 12  Temp:    SpO2: 92% 94%    Last Pain:  Vitals:   09/23/18 1345  TempSrc:   PainSc: 0-No pain   Pain Goal: Patients Stated Pain Goal: 3 (09/21/18 1245)               [name]

## 2018-09-23 NOTE — Progress Notes (Signed)
Subjective: Called to evaluate patient due to concerns for fetal malpresentation. Patient reports feeling well and is comfortable with epidural  Objective: BP (!) 106/91   Pulse (!) 225   Temp 98.7 F (37.1 C) (Axillary)   Resp 20   Ht 5\' 2"  (1.575 m)   Wt 116.6 kg   LMP 02/01/2018   SpO2 100%   BMI 47.02 kg/m  I/O last 3 completed shifts: In: 953.2 [I.V.:0.9; IV Piggyback:952.3] Out: -  No intake/output data recorded.  FHT:  Baseline 150/120, mod variability x 2, + accels, no decels UC:   irregular, every 5 minutes SVE:   Dilation: 4.5 Effacement (%): 60 Station: -2 Exam by:: Katherine g jones RN    Bedside ultrasound: baby A transverse with head on maternal right and baby B breech  Labs: Lab Results  Component Value Date   WBC 14.3 (H) 09/23/2018   HGB 9.3 (L) 09/23/2018   HCT 28.9 (L) 09/23/2018   MCV 94.4 09/23/2018   PLT 267 09/23/2018    Assessment / Plan: 32 yo G6P5 at [redacted]w[redacted]d with di-di twins admitted with PPROM and in spontaneous labor now with fetal malpresentation - Discussed delivery via cesarean section - Risks, benefits and alternatives were explained including but not limited to risks of bleeding, infection and damage to adjacent organs. Patient verbalized understanding and all questions were answered   Hyun Reali 09/23/2018, 9:29 AM

## 2018-09-23 NOTE — Anesthesia Pain Management Evaluation Note (Signed)
  CRNA Pain Management Visit Note  Patient: [name] [name], 32 y.o., female  "Hello I am a member of the anesthesia team at [facility]. We have an anesthesia team available at all times to provide care throughout the hospital, including epidural management and anesthesia for C-section. I don't know your plan for the delivery whether it a natural birth, water birth, IV sedation, nitrous supplementation, doula or epidural, but we want to meet your pain goals."   1.Was your pain managed to your expectations on prior hospitalizations?   Yes   2.What is your expectation for pain management during this hospitalization?     Epidural  3.How can we help you reach that goal?   Record the patient's initial score and the patient's pain goal.   Pain: 0  Pain Goal: 2 [facility] wants you to be able to say your pain was always managed very well.  Jabier Mutton 09/23/2018

## 2018-09-23 NOTE — Progress Notes (Signed)
OB/GYN Faculty Practice: Labor Progress Note  Subjective: Patient just received epidural. Received call from RN regarding headache. Started with placement of epidural. Patient describes as headache just as bad as migraine headaches. Usually takes Excedrin for migraines. Hasn't had a headache in awhile. Denies vision changes, abdominal pain.   Objective: BP (!) 114/44 (BP Location: Right Arm)   Pulse (!) 119   Temp 98.7 F (37.1 C) (Axillary)   Resp 20   Ht 5\' 2"  (1.575 m)   Wt 116.6 kg   LMP 02/01/2018   SpO2 100%   BMI 47.02 kg/m  Gen: uncomfortable appearing Dilation: 4 Effacement (%): 50 Cervical Position: Middle Station: Ballotable Exam by:: Dory Larsen, RN  Assessment and Plan: 32 y.o. [id number] [redacted]w[redacted]d here with PPROM, di/di twins.   Labor: Transferred from antepartum overnight because of painful contractions and SVE 4/50/ballotable. Presentation of Twin A confirmed to be vertex by BSUS.  -- plan for expectant management -- PPROM Twin A 10/22 midnight (over 48 hours ago) -- pain control: epidural now in place -- PPH Risk: medium   Fetal Well-Being: EFW Twin A 1693g (51%),Twin B 1629g (44%) at 31w2 . Cephalic by BSUS of Twin A, breech Twin B.  -- Category II - continuous fetal monitoring - Twin A with fetal tachycardia and occasional variables, receiving bolus at this time  no maternal fever  -- GBS negative -- low threshold to start antibiotics for presumed chorio given fetal tachycardia and elevated maternal temperature (though no fever at this time)   cHTN  Headache: Moderate range BP. Headache started acutely with epidural placement. Will notify anesthesia and try to treat with Tylenol, if no improvement will plan to start Mg++.  -- repeat PIH labs, UPC pending    Onda Kattner S. Juleen China, DO OB/GYN Fellow, Faculty Practice  7:22 AM

## 2018-09-23 NOTE — Op Note (Signed)
[name] [name] PROCEDURE DATE: 09/21/2018 - 09/23/2018  PREOPERATIVE DIAGNOSIS: Intrauterine pregnancy at  [redacted]w[redacted]d weeks gestation; PPROM, DI-Di twins in labor with fetal malpresentation  POSTOPERATIVE DIAGNOSIS: The same  PROCEDURE:     Cesarean Section  SURGEON:  Dr. Mora Bellman  ASSISTANT: Dr. Gerri Lins  INDICATIONS: [name] [name] is a 32 y.o. [id number] at [redacted]w[redacted]d scheduled for cesarean section secondary to PPROM, DI-Di twins in labor with fetal malpresentation.  The risks of cesarean section discussed with the patient included but were not limited to: bleeding which may require transfusion or reoperation; infection which may require antibiotics; injury to bowel, bladder, ureters or other surrounding organs; injury to the fetus; need for additional procedures including hysterectomy in the event of a life-threatening hemorrhage; placental abnormalities wth subsequent pregnancies, incisional problems, thromboembolic phenomenon and other postoperative/anesthesia complications. The patient concurred with the proposed plan, giving informed written consent for the procedure.    FINDINGS:  Viable female infants x 2: baby A transverse and baby B breech.  Apgars baby A 8 and 9, baby B apgars not availbe at time of note.  Clear amniotic fluid x 2.  Intact placenta, three vessel cord.  Normal uterus, fallopian tubes and ovaries bilaterally.  ANESTHESIA:    Spinal INTRAVENOUS FLUIDS:1500 ml ESTIMATED BLOOD LOSS: 463 ml URINE OUTPUT:  400 ml SPECIMENS: Placenta sent to L&D COMPLICATIONS: None immediate  PROCEDURE IN DETAIL:  The patient received intravenous antibiotics and had sequential compression devices applied to her lower extremities while in the preoperative area.  She was then taken to the operating room where anesthesia was induced and was found to be adequate. A foley catheter was placed into her bladder and attached to [name] [name] gravity. She was then placed in a dorsal supine position with a  leftward tilt, and prepped and draped in a sterile manner. After an adequate timeout was performed, a Pfannenstiel skin incision was made with scalpel and carried through to the underlying layer of fascia. The fascia was incised in the midline and this incision was extended bilaterally using the Mayo scissors. Kocher clamps were applied to the superior aspect of the fascial incision and the underlying rectus muscles were dissected off bluntly. A similar process was carried out on the inferior aspect of the facial incision. The rectus muscles were separated in the midline bluntly and the peritoneum was entered bluntly. The Alexis self-retaining retractor was introduced into the abdominal cavity. Attention was turned to the lower uterine segment where a bladder flap was created, and a transverse hysterotomy was made with a scalpel and extended bilaterally bluntly. The infants were successfully delivered and delayed cord clamping was performed for 1 minute for both infants. The cords were clamped and cut and infanst were handed over to awaiting neonatology team. Uterine massage was then administered and the placenta delivered intact with three-vessel cord. The uterus was cleared of clot and debris.  The hysterotomy was closed with 0 Vicryl in a running locked fashion, and an imbricating layer was also placed with a 0 Vicryl. Overall, excellent hemostasis was noted. The pelvis copiously irrigated and cleared of all clot and debris. Hemostasis was confirmed on all surfaces.  The peritoneum and the muscles were reapproximated using 0 vicryl interrupted stitches. The fascia was then closed using 0 Vicryl in a running fashion.  The subcutaneous layer was reapproximated with plain gut and the skin was closed in a subcuticular fashion using 3.0 Vicryl. Prevena wound vac was applied over the incision. The patient tolerated the procedure well.  Sponge, lap, instrument and needle counts were correct x 2. She was taken to the  recovery room in stable condition.    [name] [name] ConstantMD  09/23/2018 12:06 PM

## 2018-09-23 NOTE — Transfer of Care (Cosign Needed)
Immediate Anesthesia Transfer of Care Note  Patient: [name] [name]  Procedure(s) Performed: CESAREAN SECTION (N/A )  Patient Location: PACU  Anesthesia Type:Epidural  Level of Consciousness: awake, alert  and oriented  Airway & Oxygen Therapy: Patient Spontanous Breathing  Post-op Assessment: Report given to RN and Post -op Vital signs reviewed and stable  Post vital signs: Reviewed and stable  Last Vitals:  Vitals Value Taken Time  BP    Temp    Pulse    Resp    SpO2      Last Pain:  Vitals:   09/23/18 1000  TempSrc:   PainSc: 0-No pain      Patients Stated Pain Goal: 3 (35/33/17 4099)  Complications: No apparent anesthesia complications

## 2018-09-24 LAB — CBC
HCT: 28 % — ABNORMAL LOW (ref 36.0–46.0)
HEMOGLOBIN: 9 g/dL — AB (ref 12.0–15.0)
MCH: 30.3 pg (ref 26.0–34.0)
MCHC: 32.1 g/dL (ref 30.0–36.0)
MCV: 94.3 fL (ref 80.0–100.0)
Platelets: 229 10*3/uL (ref 150–400)
RBC: 2.97 MIL/uL — ABNORMAL LOW (ref 3.87–5.11)
RDW: 24.1 % — ABNORMAL HIGH (ref 11.5–15.5)
WBC: 13.1 10*3/uL — ABNORMAL HIGH (ref 4.0–10.5)
nRBC: 0 % (ref 0.0–0.2)

## 2018-09-24 NOTE — Lactation Note (Signed)
This note was copied from a baby's chart. Lactation Consultation Note  Patient Name: [name] [name] Today's Date: 09/24/2018   Attempted to visit with mom, but she was on the phone and didn't even responded when [facility] asked if she needed her to come back later. Spoke to RN, she let [place] know that mom is already pumping for her NICU twins but that she won't be taking them home, she'll be transferring their custody. LC will attempt to visit with mom later today.  Maternal Data    Feeding   Interventions    Lactation Tools Discussed/Used     Consult Status      Jamonta Goerner Francene Boyers 09/24/2018, 1:11 PM

## 2018-09-24 NOTE — Addendum Note (Signed)
Addendum  created 09/24/18 0745 by Kamari Buch, Waynard Reeds, CRNA   Sign clinical note

## 2018-09-24 NOTE — Progress Notes (Signed)
Subjective: Postpartum Day 1: Cesarean Delivery Patient reports some pain at incision but pain medication helps. Ambulating, voiding, tolerating diet and passing flatus. Breast pumping  Objective: Vital signs in last 24 hours: Temp:  [98.5 F (36.9 C)-99.7 F (37.6 C)] 98.5 F (36.9 C) (10/25 1138) Pulse Rate:  [102-126] 126 (10/25 1138) Resp:  [12-23] 18 (10/25 1138) BP: (116-149)/(69-88) 116/70 (10/25 1138) SpO2:  [92 %-100 %] 98 % (10/25 1138)  Physical Exam:  General: alert Lochia: appropriate Uterine Fundus: firm Incision: healing well Prevena in placed DVT Evaluation: No evidence of DVT seen on physical exam.  Recent Labs    09/23/18 1446 09/24/18 0453  HGB 9.6* 9.0*  HCT 30.4* 28.0*    Assessment/Plan: Status post Cesarean section. Doing well postoperatively.  Continue current care.  Chancy Milroy 09/24/2018, 1:21 PM

## 2018-09-24 NOTE — Clinical Social Work Maternal (Signed)
CLINICAL SOCIAL WORK MATERNAL/CHILD NOTE  Patient Details  Name: [name] [name] MRN: [id number] Date of Birth: 05/05/1986  Date:  09/24/2018  Clinical Social Worker Initiating Note:  Lael Pilch [name] Date/Time: Initiated:  09/24/18/1117     Child's Name:  Faith and Hope Schachter   Biological Parents:  Mother(MOB refused to provide any information regarding FOB. )   Need for Interpreter:  None   Reason for Referral:  Parental Support of Premature Babies < 32 weeks/or Critically Ill babies, Current CPS Involvement   Address:  [address] [name] [address]    Phone number:  [phone] (home)     Additional phone number:   Household Members/Support Persons (HM/SP):   (MOB has five children that are currently in CPS custody. )   HM/SP Name Relationship DOB or Age  HM/SP -1        HM/SP -2        HM/SP -3        HM/SP -4        HM/SP -5        HM/SP -6        HM/SP -7        HM/SP -8          Natural Supports (not living in the home):  Extended Family, Immediate Family, Parent, Friends   Professional Supports: Case Manager/Social Worker, Therapist(MOB Healthy Start worker is [name] [name] and her OBCM is [name] [name].  MOB also receives outpatient counseling at [facility] with [name] [name]. 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    Education:  9 to 11 years   Homebound arranged: No  Financial Resources:  Medicaid, SSI/Disability   Other Resources:  Food Stamps , WIC   Cultural/Religious Considerations Which May Impact Care:  Per MOB's face sheet MOB is [employer].   Strengths:  Ability to meet basic needs , Home prepared for child , Understanding of illness   Psychotropic Medications:         Pediatrician:       Pediatrician List:   [place]    [place]    [place]    [place]    [place]    [place]      Pediatrician Fax Number:    Risk Factors/Current Problems:  Mental Health  Concerns , Intellectual Development Disorder , DHHS Involvement    Cognitive State:  Able to Concentrate , Insightful , Linear Thinking    Mood/Affect:  Interested , Relaxed , Anxious , Overwhelmed    CSW Assessment: CSW met with MOB in room 310 a the request of MOB and for MH hx and CPS hx. When CSW arrived MOB was resting in bed and MOB's friend was on the couch on her phone.  MOB identified her friend as [name] [name] ([birth year]). CSW explained CSW's role and MOB gave CSW permission to complete the assessment while MOB's friend was present. MOB was polite and receptive to meeting with CSW.  CSW inquired about MOB's request to meet with CSW. MOB expressed interest in transferring custody of her twins to MGM ([name] [name]) whom lives in [place] ([id number]). CSW explained transfer of custody process and encouraged MOB to seek legal advise.  MOB stated that MOB has an attorney, [name] [name].  CSW asked why MOB was interested in transferring custody to MGM and MOB reported that MOB has 5 older children (11G, 9B, 7G, 5B, and 2B) currently in CPS custody.    MOB stated being compliant with her case plan but children has been in custody for over 2 years. CSW made MOB aware that due to MOB's CPS hx CSW will need to make a report to [place] CPS (report was made with [name] [name]). MOB became quiet and stated, "I know they are going to try and take my twins so that's why I want to give them to my mom."   CSW asked about MOB's MH hx and MOB openly shared and extensive hex of anxiety and depression.  MOB reported discontinue the use of Zoloft during pregnancy but plans to resume.  MOB shared that MOB receives outpatient counseling at [facility] with [name] [name] and MOB's Healthy Start worker is [name] [name].  CSW encouraged MOB to continue to comply with her community supports and to informed MOB's provider if MOB starts to demonstrate any PMAD signs or symtoms.   CSW provided education regarding the baby blues period vs. perinatal mood disorders, discussed treatment and gave resources for mental health follow up if concerns arise.  CSW recommends self-evaluation during the postpartum time period using the New Mom Checklist from Postpartum Progress and encouraged MOB to contact a medical professional if symptoms are noted at any time.  CSW assessed for safety and MOB denied SI, HI, and DV.  MOB did not present with any acute signs or symptoms.   At this time there are barriers to twins discharge with MOB until CPS communicate the safety disposition plan.  MOB's Foster Care worker is [name] [name] [phone].  CSW Plan/Description:  Psychosocial Support and Ongoing Assessment of Needs, Perinatal Mood and Anxiety Disorder (PMADs) Education, Other Information/Referral to Community Resources, Child Protective Service Report , CSW Awaiting CPS Disposition Plan   [name], MSW, LCSW Clinical Social Work [phone]  [name] Spagnuolo D BOYD-GILYARD, LCSW 09/24/2018, 11:26 AM 

## 2018-09-24 NOTE — Addendum Note (Signed)
Addendum  created 09/24/18 0802 by Auriah Hollings, Waynard Reeds, CRNA   Sign clinical note

## 2018-09-24 NOTE — Anesthesia Postprocedure Evaluation (Signed)
Anesthesia Post Note  Patient: [name] [name]  Procedure(s) Performed: CESAREAN SECTION (N/A )     Patient location during evaluation: [facility] Anesthesia Type: Epidural Level of consciousness: awake, awake and alert and oriented Pain management: pain level controlled Vital Signs Assessment: post-procedure vital signs reviewed and stable Respiratory status: spontaneous breathing, nonlabored ventilation and respiratory function stable Cardiovascular status: stable Postop Assessment: no headache, no backache, no apparent nausea or vomiting, adequate PO intake, able to ambulate and patient able to bend at knees Anesthetic complications: no    Last Vitals:  Vitals:   09/23/18 2341 09/24/18 0401  BP: (!) 142/74 128/69  Pulse: (!) 113 (!) 111  Resp: 20 17  Temp: 37.6 C 37.3 C  SpO2: 100% 100%    Last Pain:  Vitals:   09/24/18 0705  TempSrc:   PainSc: Asleep   Pain Goal: Patients Stated Pain Goal: 2 (09/24/18 0649)               Marcy Siren

## 2018-09-24 NOTE — Anesthesia Postprocedure Evaluation (Signed)
Anesthesia Post Note  Patient: [name] [name]  Procedure(s) Performed: CESAREAN SECTION (N/A )     Patient location during evaluation: [facility] Unit Anesthesia Type: Epidural Level of consciousness: awake, awake and alert and oriented Pain management: pain level controlled Vital Signs Assessment: post-procedure vital signs reviewed and stable Respiratory status: spontaneous breathing, nonlabored ventilation and respiratory function stable Cardiovascular status: stable Postop Assessment: no headache, no apparent nausea or vomiting, no backache, able to ambulate, adequate PO intake and patient able to bend at knees Anesthetic complications: no    Last Vitals:  Vitals:   09/23/18 2341 09/24/18 0401  BP: (!) 142/74 128/69  Pulse: (!) 113 (!) 111  Resp: 20 17  Temp: 37.6 C 37.3 C  SpO2: 100% 100%    Last Pain:  Vitals:   09/24/18 0705  TempSrc:   PainSc: Asleep   Pain Goal: Patients Stated Pain Goal: 2 (09/24/18 0649)               Marcy Siren

## 2018-09-25 ENCOUNTER — Encounter (HOSPITAL_COMMUNITY): Payer: Self-pay | Admitting: Obstetrics and Gynecology

## 2018-09-25 LAB — TYPE AND SCREEN
ABO/RH(D): A NEG
Antibody Screen: POSITIVE
UNIT DIVISION: 0
Unit division: 0

## 2018-09-25 LAB — BPAM RBC
Blood Product Expiration Date: 201911182359
Blood Product Expiration Date: 201911192359
UNIT TYPE AND RH: 600
Unit Type and Rh: 600

## 2018-09-25 NOTE — Lactation Note (Signed)
This note was copied from a baby's chart. Lactation Consultation Note: Follow up visit with this mom of 33w 3d twins in NICU. She reports that she pumped 5 times yesterday. Is concerned that she is not making much milk- states she obtained about 20 ml at last pumping. Reports her milk came in much faster with her other babies. I asked her about a pump for home and she states [facility] was supposed to come yesterday and bring her a pump but they did not, Offered [facility] loaner at [place] but mom states she does not have the money. I showed her how to use pump pieces as manual pump. States she will go to [facility] on Monday. I asked her where the babies would be going when they left the hospital and she said she did not want to talk about it. Reviewed engorgement prevention and treatment. Mom reports pumping is hurting some- #30 flanges given for mom to try- looks like better fit. States she will pump again after she eats breakfast. No questions at present. States she just wants her milk to come in. To call prn  Patient Name: [name] [name] [id number] Date: 09/25/2018 Reason for consult: Follow-up assessment;Preterm <34wks;NICU baby;Multiple gestation   Maternal Data Formula Feeding for Exclusion: No Has patient been taught Hand Expression?: Yes Does the patient have breastfeeding experience prior to this delivery?: Yes  Feeding Feeding Type: Donor Breast Milk  LATCH Score                   Interventions    Lactation Tools Discussed/Used WIC Program: Yes Pump Review: Setup, frequency, and cleaning Initiated by:: RN Date initiated:: 09/24/18   Consult Status Consult Status: Follow-up Date: 09/26/18 Follow-up type: In-patient    Truddie Crumble 09/25/2018, 8:03 AM

## 2018-09-25 NOTE — Progress Notes (Signed)
Subjective: Postpartum Day 2: Cesarean Delivery Patient reports incisional pain, tolerating PO and no problems voiding.    Objective: Vital signs in last 24 hours: Temp:  [97.5 F (36.4 C)-99.3 F (37.4 C)] 97.5 F (36.4 C) (10/26 0702) Pulse Rate:  [109-126] 109 (10/26 0702) Resp:  [18] 18 (10/26 0702) BP: (116-157)/(70-89) 133/80 (10/26 0702) SpO2:  [97 %-100 %] 100 % (10/26 2761)  Physical Exam:  General: alert, cooperative and no distress Lochia: appropriate Uterine Fundus: firm Incision: healing well wound vac in place working DVT Evaluation: No evidence of DVT seen on physical exam.  Recent Labs    09/23/18 1446 09/24/18 0453  HGB 9.6* 9.0*  HCT 30.4* 28.0*    Assessment/Plan: Status post Cesarean section. Doing well postoperatively.  Discharge tomorrow.  Florian Buff 09/25/2018, 7:42 AM

## 2018-09-25 NOTE — Plan of Care (Signed)
Pts. Condition will continue to improve 

## 2018-09-26 LAB — CBC
HEMATOCRIT: 28 % — AB (ref 36.0–46.0)
Hemoglobin: 9 g/dL — ABNORMAL LOW (ref 12.0–15.0)
MCH: 30.4 pg (ref 26.0–34.0)
MCHC: 32.1 g/dL (ref 30.0–36.0)
MCV: 94.6 fL (ref 80.0–100.0)
NRBC: 0.6 % — AB (ref 0.0–0.2)
Platelets: 317 10*3/uL (ref 150–400)
RBC: 2.96 MIL/uL — AB (ref 3.87–5.11)
RDW: 24.1 % — AB (ref 11.5–15.5)
WBC: 11.3 10*3/uL — ABNORMAL HIGH (ref 4.0–10.5)

## 2018-09-26 LAB — COMPREHENSIVE METABOLIC PANEL
ALT: 15 U/L (ref 0–44)
AST: 21 U/L (ref 15–41)
Albumin: 2.9 g/dL — ABNORMAL LOW (ref 3.5–5.0)
Alkaline Phosphatase: 72 U/L (ref 38–126)
Anion gap: 12 (ref 5–15)
BILIRUBIN TOTAL: 0.2 mg/dL — AB (ref 0.3–1.2)
BUN: 16 mg/dL (ref 6–20)
CHLORIDE: 104 mmol/L (ref 98–111)
CO2: 23 mmol/L (ref 22–32)
Calcium: 8.7 mg/dL — ABNORMAL LOW (ref 8.9–10.3)
Creatinine, Ser: 0.59 mg/dL (ref 0.44–1.00)
GLUCOSE: 106 mg/dL — AB (ref 70–99)
POTASSIUM: 3.7 mmol/L (ref 3.5–5.1)
Sodium: 139 mmol/L (ref 135–145)
Total Protein: 6.8 g/dL (ref 6.5–8.1)

## 2018-09-26 MED ORDER — FUROSEMIDE 40 MG PO TABS
40.0000 mg | ORAL_TABLET | Freq: Two times a day (BID) | ORAL | Status: DC
  Administered 2018-09-26 – 2018-09-27 (×3): 40 mg via ORAL
  Filled 2018-09-26 (×5): qty 1

## 2018-09-26 MED ORDER — ENALAPRIL MALEATE 10 MG PO TABS
10.0000 mg | ORAL_TABLET | Freq: Every day | ORAL | Status: DC
  Administered 2018-09-26 – 2018-09-27 (×2): 10 mg via ORAL
  Filled 2018-09-26 (×3): qty 1

## 2018-09-26 MED ORDER — POTASSIUM CHLORIDE CRYS ER 20 MEQ PO TBCR
40.0000 meq | EXTENDED_RELEASE_TABLET | Freq: Two times a day (BID) | ORAL | Status: DC
  Administered 2018-09-26 – 2018-09-27 (×3): 40 meq via ORAL
  Filled 2018-09-26 (×5): qty 2

## 2018-09-26 NOTE — Lactation Note (Signed)
This note was copied from a baby's chart. Lactation Consultation Note  Patient Name: [name] [name] Today's Date: 09/26/2018    Lactation Follow up note Mom has 20 ml of colostrum sitting on bedside table.  Inquired about when she pumped it and she stated 1155am.  Offered to put in breastmilk fridge for mom and she declined.  Urged mom to refrigerate breastmilk promptly to lessen bacterial growth. Mom reports she is going to see them at 2 pm.  Urged pumping 8-12 times day.  Mom reports that is a lot of pumping. Mom reports she would like to bf with them to help her milk come faster.  Urged her to talk with babies nurse.   Mom falling asleep while talking with her.  Will follow up PRN    Maternal Data    Feeding Feeding Type: Donor Breast Milk  LATCH Score                   Interventions    Lactation Tools Discussed/Used     Consult Status      [name] [name] 09/26/2018, 12:35 PM

## 2018-09-26 NOTE — Progress Notes (Signed)
Postpartum Day 3: Cesarean Delivery for fetal malpresentation, twins, CHTN  Subjective: Patient reports headaches since yesterday, not alleviated by pain meds. Denies visual symptoms, RUQ/epigastric pain. Patient reports incisional pain, tolerating PO and no problems voiding.  Had flatus.  Worried about swelling in legs. Babies stable in NICU. Breastfeeding.  Objective: Vital signs in last 24 hours: Temp:  [98.7 F (37.1 C)-98.9 F (37.2 C)] 98.9 F (37.2 C) (10/26 1945) Pulse Rate:  [122-124] 122 (10/26 1945) Resp:  [18] 18 (10/26 1945) BP: (131-138)/(83-95) 131/83 (10/26 1945) SpO2:  [100 %] 100 % (10/26 1945) Patient Vitals for the past 24 hrs:  BP Temp Temp src Pulse Resp SpO2  09/25/18 1945 131/83 98.9 F (37.2 C) Oral (!) 122 18 100 %  09/25/18 1526 (!) 138/95 98.7 F (37.1 C) Oral (!) 124 18 100 %    Physical Exam:  General: alert, cooperative and no distress Lochia: appropriate Uterine Fundus: firm Incision: healing well wound vac in place working DVT Evaluation: No evidence of DVT seen on physical exam. 3+ BLE edema.  CBC Latest Ref Rng & Units 09/24/2018 09/23/2018 09/23/2018  WBC 4.0 - 10.5 K/uL 13.1(H) 11.0(H) 14.3(H)  Hemoglobin 12.0 - 15.0 g/dL 9.0(L) 9.6(L) 9.3(L)  Hematocrit 36.0 - 46.0 % 28.0(L) 30.4(L) 28.9(L)  Platelets 150 - 400 K/uL 229 267 267   CMP Latest Ref Rng & Units 09/23/2018 09/23/2018 07/24/2018  Glucose 70 - 99 mg/dL - 108(H) 79  BUN 6 - 20 mg/dL - 8 11  Creatinine 0.44 - 1.00 mg/dL 0.61 0.52 0.56  Sodium 135 - 145 mmol/L - 137 133(L)  Potassium 3.5 - 5.1 mmol/L - 3.4(L) 3.9  Chloride 98 - 111 mmol/L - 104 102  CO2 22 - 32 mmol/L - 22 21(L)  Calcium 8.9 - 10.3 mg/dL - 8.7(L) 9.4  Total Protein 6.5 - 8.1 g/dL - 6.9 7.0  Total Bilirubin 0.3 - 1.2 mg/dL - 0.5 0.3  Alkaline Phos 38 - 126 U/L - 85 90  AST 15 - 41 U/L - 18 20  ALT 0 - 44 U/L - 17 21   Assessment/Plan: POD#3 status post Cesarean section.  Will start Enalapril 10 mg po  daily Lasix 40 mg bid ordered to help with edema; K-Dur 40 bid also ordered Advised to take stronger medication (Oxycodone) for headche, will check PEC labs. If headaches worsen, consider imaging. Discharge tomorrow if stable and no concerning symptoms.  Verita Schneiders, MD 09/26/2018, 10:03 AM

## 2018-09-27 MED ORDER — IBUPROFEN 600 MG PO TABS: 600.0000 mg | ORAL_TABLET | Freq: Four times a day (QID) | ORAL | 0 refills | Status: DC

## 2018-09-27 MED ORDER — HYDROCHLOROTHIAZIDE 25 MG PO TABS: 25.0000 mg | ORAL_TABLET | Freq: Every day | ORAL | 0 refills | Status: DC

## 2018-09-27 MED ORDER — ENALAPRIL MALEATE 10 MG PO TABS: 10.0000 mg | ORAL_TABLET | Freq: Every day | ORAL | 1 refills | Status: DC

## 2018-09-27 MED ORDER — OXYCODONE HCL 5 MG PO TABS: 5.0000 mg | ORAL_TABLET | ORAL | 0 refills | Status: DC | PRN

## 2018-09-27 NOTE — Discharge Instructions (Signed)

## 2018-09-27 NOTE — Lactation Note (Signed)
This note was copied from a baby's chart. Lactation Consultation Note  Patient Name: [name] [name] [id number] Date: 09/27/2018  Mom recently pumped 120 mls.  She states she is pumping every 2-3 hours.  Informed to go by [facility] office for a breast pump.  Instructed to bring pump pieces to hospital when she comes to the NICU.  Encouraged to call for assist/concerns.   Maternal Data    Feeding    LATCH Score                   Interventions    Lactation Tools Discussed/Used     Consult Status      Ave Filter 09/27/2018, 12:25 PM

## 2018-09-27 NOTE — Progress Notes (Signed)
Printed discharge instruction given. Pt verbalized an understanding. Pt states she is having difficulty obtaining a ride but is in the process of making phone calls to achieve that. Will continue to monitor. Toya Smothers, RN

## 2018-09-27 NOTE — Discharge Summary (Signed)
Physician Discharge Summary  Patient ID: [name] [name] MRN: [id number] DOB/AGE: 30-Aug-1986 30 y.o.  Admit date: 09/21/2018 Discharge date: 09/28/2018  Admission Diagnoses: Twins PROM malpresentation  Discharge Diagnoses:  Active Problems:   HSV (herpes simplex virus) anogenital infection   Rh negative, maternal   Dichorionic diamniotic twin pregnancy   History of vaginal birth after cesarean   Abnormal glucose affecting pregnancy   PROM (premature rupture of membranes)   Discharged Condition: good  Hospital Course: DiDi twins with PPROM and malpresentation and eventaully contrations Uncomplicated Caesarean section with uncomplicated postpartum course provena placed  Consults: None  Significant Diagnostic Studies: labs:   Treatments: post op management  Discharge Exam: Blood pressure 125/86, pulse (!) 105, temperature 98.6 F (37 C), temperature source Oral, resp. rate 18, height 5\' 2"  (1.575 m), weight 257 lb 0.9 oz (116.6 kg), last menstrual period 02/01/2018, SpO2 100 %, unknown if currently breastfeeding. General appearance: alert, cooperative and no distress GI: soft, non-tender; bowel sounds normal; no masses,  no organomegaly Incision/Wound:provena in place working  Disposition: Discharge disposition: 01-Home or Self Care       Discharge Instructions    Call MD for:  persistant nausea and vomiting   Complete by:  As directed    Call MD for:  severe uncontrolled pain   Complete by:  As directed    Call MD for:  temperature >100.4   Complete by:  As directed    Diet - low sodium heart healthy   Complete by:  As directed    Discharge wound care:   Complete by:  As directed    Keep provena on and remove in office 7 days postpartum   Driving Restrictions   Complete by:  As directed    No driving   Increase activity slowly   Complete by:  As directed    Lifting restrictions   Complete by:  As directed    Do not lift more than 10 pounds   Sexual  Activity Restrictions   Complete by:  As directed    None for 6 weeks     Allergies as of 09/27/2018   No Known Allergies     Medication List    STOP taking these medications   aspirin EC 81 MG tablet   PRENATAL PLUS/IRON 27-1 MG Tabs   promethazine 25 MG tablet Commonly known as:  PHENERGAN   sulfamethoxazole-trimethoprim 800-160 MG tablet Commonly known as:  BACTRIM DS,SEPTRA DS     TAKE these medications   Abdominal Binder/Elastic XL Misc 1 Units by Does not apply route as needed.   Butalbital-APAP-Caffeine 50-325-40 MG capsule Take 1 capsule by mouth every 6 (six) hours as needed for headache.   enalapril 10 MG tablet Commonly known as:  VASOTEC Take 1 tablet (10 mg total) by mouth daily.   hydrochlorothiazide 25 MG tablet Commonly known as:  HYDRODIURIL Take 1 tablet (25 mg total) by mouth daily.   ibuprofen 600 MG tablet Commonly known as:  ADVIL,MOTRIN Take 1 tablet (600 mg total) by mouth every 6 (six) hours.   Magnesium Oxide 240 MG Pack Take 1 tablet by mouth daily as needed (Headache).   oxyCODONE 5 MG immediate release tablet Commonly known as:  Oxy IR/ROXICODONE Take 1 tablet (5 mg total) by mouth every 4 (four) hours as needed (pain scale 4-7).   PRENATAL ADULT GUMMY/DHA/FA PO Take 2 tablets by mouth daily.   valACYclovir 1000 MG tablet Commonly known as:  VALTREX Take 1 tablet (1,000 mg  total) by mouth daily.            Discharge Care Instructions  (From admission, onward)         Start     Ordered   09/27/18 0000  Discharge wound care:    Comments:  Keep provena on and remove in office 7 days postpartum   09/27/18 [place] Follow up on 09/30/2018.   Why:  removal of provena Contact information: [place] [address] [phone]          Signed: Florian Buff 09/28/2018, 3:53 PM

## 2018-09-28 ENCOUNTER — Encounter (HOSPITAL_COMMUNITY): Payer: Self-pay | Admitting: *Deleted

## 2018-09-28 ENCOUNTER — Inpatient Hospital Stay (HOSPITAL_COMMUNITY): Payer: Medicaid Other

## 2018-09-28 ENCOUNTER — Inpatient Hospital Stay (HOSPITAL_COMMUNITY)
Admission: AD | Admit: 2018-09-28 | Discharge: 2018-09-28 | Disposition: A | Discharge status: Home / Self Care | Payer: Medicaid Other | Source: Ambulatory Visit | Attending: Family Medicine | Admitting: Family Medicine

## 2018-09-28 DIAGNOSIS — Z9889 Other specified postprocedural states: Secondary | ICD-10-CM | POA: Insufficient documentation

## 2018-09-28 DIAGNOSIS — L7682 Other postprocedural complications of skin and subcutaneous tissue: Secondary | ICD-10-CM

## 2018-09-28 DIAGNOSIS — I10 Essential (primary) hypertension: Secondary | ICD-10-CM | POA: Insufficient documentation

## 2018-09-28 DIAGNOSIS — Z7952 Long term (current) use of systemic steroids: Secondary | ICD-10-CM | POA: Diagnosis not present

## 2018-09-28 DIAGNOSIS — Z8249 Family history of ischemic heart disease and other diseases of the circulatory system: Secondary | ICD-10-CM | POA: Insufficient documentation

## 2018-09-28 DIAGNOSIS — Z98891 History of uterine scar from previous surgery: Secondary | ICD-10-CM

## 2018-09-28 DIAGNOSIS — Z833 Family history of diabetes mellitus: Secondary | ICD-10-CM | POA: Insufficient documentation

## 2018-09-28 DIAGNOSIS — K5900 Constipation, unspecified: Secondary | ICD-10-CM | POA: Diagnosis not present

## 2018-09-28 DIAGNOSIS — R140 Abdominal distension (gaseous): Secondary | ICD-10-CM | POA: Insufficient documentation

## 2018-09-28 DIAGNOSIS — Z79899 Other long term (current) drug therapy: Secondary | ICD-10-CM | POA: Diagnosis not present

## 2018-09-28 DIAGNOSIS — R109 Unspecified abdominal pain: Secondary | ICD-10-CM | POA: Diagnosis present

## 2018-09-28 LAB — COMPREHENSIVE METABOLIC PANEL
ALT: 17 U/L (ref 0–44)
AST: 21 U/L (ref 15–41)
Albumin: 3.2 g/dL — ABNORMAL LOW (ref 3.5–5.0)
Alkaline Phosphatase: 76 U/L (ref 38–126)
Anion gap: 10 (ref 5–15)
BUN: 19 mg/dL (ref 6–20)
CHLORIDE: 101 mmol/L (ref 98–111)
CO2: 28 mmol/L (ref 22–32)
CREATININE: 0.63 mg/dL (ref 0.44–1.00)
Calcium: 9.2 mg/dL (ref 8.9–10.3)
GFR calc Af Amer: >60 mL/min (ref >60)
GFR calc non Af Amer: >60 mL/min (ref >60)
Glucose, Bld: 82 mg/dL (ref 70–99)
Potassium: 3.9 mmol/L (ref 3.5–5.1)
SODIUM: 139 mmol/L (ref 135–145)
Total Bilirubin: 0.4 mg/dL (ref 0.3–1.2)
Total Protein: 7.5 g/dL (ref 6.5–8.1)

## 2018-09-28 LAB — CBC WITH DIFFERENTIAL/PLATELET
BASOS ABS: 0 10*3/uL (ref 0.0–0.1)
Basophils Relative: 0 %
Eosinophils Absolute: 0.1 10*3/uL (ref 0.0–0.5)
Eosinophils Relative: 1 %
HCT: 32.3 % — ABNORMAL LOW (ref 36.0–46.0)
Hemoglobin: 10.1 g/dL — ABNORMAL LOW (ref 12.0–15.0)
LYMPHS ABS: 5.4 10*3/uL — AB (ref 0.7–4.0)
Lymphocytes Relative: 46 %
MCH: 29.4 pg (ref 26.0–34.0)
MCHC: 31.3 g/dL (ref 30.0–36.0)
MCV: 94.2 fL (ref 80.0–100.0)
MONO ABS: 0.5 10*3/uL (ref 0.1–1.0)
MONOS PCT: 4 %
NRBC: 0 % (ref 0.0–0.2)
Neutro Abs: 5.8 10*3/uL (ref 1.7–7.7)
Neutrophils Relative %: 49 %
PLATELETS: 365 10*3/uL (ref 150–400)
RBC: 3.43 MIL/uL — AB (ref 3.87–5.11)
RDW: 23.7 % — ABNORMAL HIGH (ref 11.5–15.5)
WBC: 11.8 10*3/uL — AB (ref 4.0–10.5)

## 2018-09-28 MED ORDER — POLYETHYLENE GLYCOL 3350 17 G PO PACK: 17.0000 g | PACK | Freq: Every day | ORAL | 0 refills | Status: DC

## 2018-09-28 MED ORDER — OXYCODONE-ACETAMINOPHEN 5-325 MG PO TABS
2.0000 | ORAL_TABLET | Freq: Once | ORAL | Status: AC
  Administered 2018-09-28: 2 via ORAL
  Filled 2018-09-28: qty 2

## 2018-09-28 NOTE — MAU Note (Signed)
Provider Hansel Feinstein, CNM removed wound vac.

## 2018-09-28 NOTE — MAU Note (Signed)
Pt presents to MAU with complaints of pain at her incision site. PT had a C/S on October the 25th. Wound vac to incision

## 2018-09-28 NOTE — Discharge Instructions (Signed)
Constipation, Adult Constipation is when a person has fewer bowel movements in a week than normal, has difficulty having a bowel movement, or has stools that are dry, hard, or larger than normal. Constipation may be caused by an underlying condition. It may become worse with age if a person takes certain medicines and does not take in enough fluids. Follow these instructions at home: Eating and drinking   Eat foods that have a lot of fiber, such as fresh fruits and vegetables, whole grains, and beans.  Limit foods that are high in fat, low in fiber, or overly processed, such as french fries, hamburgers, cookies, candies, and soda.  Drink enough fluid to keep your urine clear or pale yellow. General instructions  Exercise regularly or as told by your health care provider.  Go to the restroom when you have the urge to go. Do not hold it in.  Take over-the-counter and prescription medicines only as told by your health care provider. These include any fiber supplements.  Practice pelvic floor retraining exercises, such as deep breathing while relaxing the lower abdomen and pelvic floor relaxation during bowel movements.  Watch your condition for any changes.  Keep all follow-up visits as told by your health care provider. This is important. Contact a health care provider if:  You have pain that gets worse.  You have a fever.  You do not have a bowel movement after 4 days.  You vomit.  You are not hungry.  You lose weight.  You are bleeding from the anus.  You have thin, pencil-like stools. Get help right away if:  You have a fever and your symptoms suddenly get worse.  You leak stool or have blood in your stool.  Your abdomen is bloated.  You have severe pain in your abdomen.  You feel dizzy or you faint. This information is not intended to replace advice given to you by your health care provider. Make sure you discuss any questions you have with your health care  provider. Document Released: 08/15/2004 Document Revised: 06/06/2016 Document Reviewed: 05/07/2016 Elsevier Interactive Patient Education  2018 [place] Sutures are stitches that can be used to close wounds. Taking care of your wound properly can help to prevent pain and infection. It can also help your wound to heal more quickly. How is this treated? Wound Care  Keep the wound clean and dry.  If you were given a bandage (dressing), you should change it at least once per day or as directed by your health care provider. You should also change it if it becomes wet or dirty.  Keep the wound completely dry for the first 24 hours or as directed by your health care provider. After that time, you may shower or bathe. However, make sure that the wound is not soaked in water until the sutures have been removed.  Clean the wound one time each day or as directed by your health care provider. ? Wash the wound with soap and water. ? Rinse the wound with water to remove all soap. ? Pat the wound dry with a clean towel. Do not rub the wound.  Aftercleaning the wound, apply a thin layer of antibioticointment as directed by your health care provider. This will help to prevent infection and keep the dressing from sticking to the wound.  Have the sutures removed as directed by your health care provider. General Instructions  Take or apply medicines only as directed by your health care provider.  To help prevent scarring, make sure to cover your wound with sunscreen whenever you are outside after the sutures are removed and the wound is healed. Make sure to wear a sunscreen of at least 30 SPF.  If you were prescribed an antibiotic medicine or ointment, finish all of it even if you start to feel better.  Do not scratch or pick at the wound.  Keep all follow-up visits as directed by your health care provider. This is important.  Check your wound every day for signs of  infection. Watch for: ? Redness, swelling, or pain. ? Fluid, blood, or pus.  Raise (elevate) the injured area above the level of your heart while you are sitting or lying down, if possible.  Avoid stretching your wound.  Drink enough fluids to keep your urine clear or pale yellow. Contact a health care provider if:  You received a tetanus shot and you have swelling, severe pain, redness, or bleeding at the injection site.  You have a fever.  A wound that was closed breaks open.  You notice a bad smell coming from the wound.  You notice something coming out of the wound, such as wood or glass.  Your pain is not controlled with medicine.  You have increased redness, swelling, or pain at the site of your wound.  You have fluid, blood, or pus coming from your wound.  You notice a change in the color of your skin near your wound.  You need to change the dressing frequently due to fluid, blood, or pus draining from the wound.  You develop a new rash.  You develop numbness around the wound. Get help right away if:  You develop severe swelling around the injury site.  Your pain suddenly increases and is severe.  You develop painful lumps near the wound or on skin that is anywhere on your body.  You have a red streak going away from your wound.  The wound is on your hand or foot and you cannot properly move a finger or toe.  The wound is on your hand or foot and you notice that your fingers or toes look pale or bluish. This information is not intended to replace advice given to you by your health care provider. Make sure you discuss any questions you have with your health care provider. Document Released: 12/25/2004 Document Revised: 04/24/2016 Document Reviewed: 06/29/2013 Elsevier Interactive Patient Education  2017 [employer].

## 2018-09-28 NOTE — MAU Provider Note (Signed)
Chief Complaint:  Abdominal Pain   First Provider Initiated Contact with Patient 09/28/18 2008     HPI: [name] [name] is a 32 y.o. [phone] who is 5 days post-op from C/S for malpresentation of twins who presents to maternity admissions reporting increased abdominal pain since surgery.  Has been taking ibuprofen but has only taken one Percocet all day.  States this is more pain than in prior C/S. States she noticed an odor coming from incision.  Worried she has an infection in her wound.  Vac is alarming, seal is broken. . She reports no vaginal itching/burning, urinary symptoms, h/a, dizziness, n/v, or fever/chills.    Abdominal Pain  This is a recurrent problem. The current episode started today. The onset quality is gradual. The problem occurs constantly. The problem has been gradually worsening. The pain is located in the generalized abdominal region, LLQ and RLQ (over incision and lower half of abdomen). The quality of the pain is sharp, a sensation of fullness, cramping and dull. The abdominal pain does not radiate. Associated symptoms include nausea. Pertinent negatives include no constipation (reports daily BM but yesterday's was small and did not have one today), diarrhea, fever (but does have chills), headaches or vomiting. The pain is aggravated by certain positions, deep breathing, movement and palpation. The pain is relieved by nothing. She has tried oral narcotic analgesics (ibuprofen) for the symptoms. The treatment provided no relief.   RN Note: Pt presents to MAU with complaints of pain at her incision site. PT had a C/S on October the 25th. Wound vac to incision  Past Medical History: Past Medical History:  Diagnosis Date  . Abnormal Pap smear    f/u was normal  . Chronic hypertension   . DEPRESSION, MAJOR, RECURRENT, MODERATE 04/18/2010   Qualifier: Diagnosis of  By: Georgina Snell MD, Ellard Artis    . Headache(784.0)   . History of abnormal Pap smear 07/28/2012  . Infection    trich,  chlamydia, gonorrhea  . Ovarian cyst   . Urinary tract infection     Past obstetric history: OB History  Gravida Para Term Preterm AB Living  6 6 5 1  0 7  SAB TAB Ectopic Multiple Live Births  0 0 0 1 7    # Outcome Date GA Lbr Len/2nd Weight Sex Delivery Anes PTL Lv  6A Preterm 09/23/18 [redacted]w[redacted]d  1800 g F CS-LTranv EPI  LIV  6B Preterm 09/23/18 [redacted]w[redacted]d  2090 g F CS-LTranv EPI  LIV  5 Term 11/18/15 [redacted]w[redacted]d 02:35 / 00:15 3425 g M Vag-Spont EPI  LIV  4 Term 07/13/13 [redacted]w[redacted]d 02:11 3501 g M VBAC EPI  LIV     Birth Comments: extra digits on hands bilaterally  3 Term 11/22/10 [redacted]w[redacted]d  3317 g F VBAC  N LIV     Birth Comments: vbac  2 Term 09/23/09 [redacted]w[redacted]d  3487 g M VBAC  N LIV     Birth Comments: VBAC  1 Term 10/27/06 [redacted]w[redacted]d  3430 g F CS-LTranv  N DEC     Birth Comments: fetal decels    Past Surgical History: Past Surgical History:  Procedure Laterality Date  . CESAREAN SECTION    . CESAREAN SECTION N/A 09/23/2018   Procedure: CESAREAN SECTION;  Surgeon: Mora Bellman, MD;  Location: [facility];  Service: Obstetrics;  Laterality: N/A;  . TONSILLECTOMY    . WISDOM TOOTH EXTRACTION      Family History: Family History  Problem Relation Age of Onset  . Hypertension Mother   .  Arthritis Mother   . Hypertension Father   . Asthma Brother   . Cancer Maternal Uncle        lung  . Diabetes Maternal Grandmother   . Cancer Maternal Grandmother        thyroid  . Hypertension Maternal Grandmother   . Other Neg Hx     Social History: Social History   Tobacco Use  . Smoking status: Never Smoker  . Smokeless tobacco: Never Used  Substance Use Topics  . Alcohol use: Not Currently    Alcohol/week: 2.0 standard drinks    Types: 1 Glasses of wine, 1 Shots of liquor per week    Comment: Not currently  . Drug use: Not Currently    Types: Marijuana    Comment: Not currently    Allergies: No Known Allergies  Meds:  Medications Prior to Admission  Medication Sig Dispense Refill  Last Dose  . Butalbital-APAP-Caffeine 50-325-40 MG capsule Take 1 capsule by mouth every 6 (six) hours as needed for headache. 30 capsule 0 Past Week at Unknown time  . Elastic Bandages & Supports (ABDOMINAL BINDER/ELASTIC XL) MISC 1 Units by Does not apply route as needed. 1 each 0   . enalapril (VASOTEC) 10 MG tablet Take 1 tablet (10 mg total) by mouth daily. 30 tablet 1   . hydrochlorothiazide (HYDRODIURIL) 25 MG tablet Take 1 tablet (25 mg total) by mouth daily. 30 tablet 0   . ibuprofen (ADVIL,MOTRIN) 600 MG tablet Take 1 tablet (600 mg total) by mouth every 6 (six) hours. 30 tablet 0   . Magnesium Oxide (MAGNESIUM OXIDE 400) 240 MG PACK Take 1 tablet by mouth daily as needed (Headache).   Past Week at Unknown time  . oxyCODONE (OXY IR/ROXICODONE) 5 MG immediate release tablet Take 1 tablet (5 mg total) by mouth every 4 (four) hours as needed (pain scale 4-7). 30 tablet 0   . Prenatal MV & Min w/FA-DHA (PRENATAL ADULT GUMMY/DHA/FA PO) Take 2 tablets by mouth daily.   Past Month at Unknown time  . valACYclovir (VALTREX) 1000 MG tablet Take 1 tablet (1,000 mg total) by mouth daily. 30 tablet 1 Past Week at Unknown time    I have reviewed patient's Past Medical Hx, Surgical Hx, Family Hx, Social Hx, medications and allergies.  ROS:  Review of Systems  Constitutional: Negative for fever (but does have chills).  Gastrointestinal: Positive for abdominal pain and nausea. Negative for constipation (reports daily BM but yesterday's was small and did not have one today), diarrhea and vomiting.  Neurological: Negative for headaches.   Other systems negative     Physical Exam   Patient Vitals for the past 24 hrs:  BP Temp Pulse Resp  09/28/18 2001 (!) 144/84 98.6 F (37 C) (!) 113 16   Constitutional: Well-developed, well-nourished female in no acute distress, but uncomfortable looking  Cardiovascular: normal rate and rhythm Respiratory: normal effort, no distress.  GI: Abd soft, tender.   Moderately distended.  No rebound, Mild guarding.  Bowel Sounds hypoactive.  Only one sound in a minute of auscultation over 4 quadrants     [place] has suction light on, right corner of dressing is disrupted.   Small amount of dark red drainage in resevoir MS: Extremities nontender, no edema, normal ROM Neurologic: Alert and oriented x 4.   Grossly nonfocal. GU: Neg CVAT. Skin:  Warm and Dry Psych:  Affect appropriate.  PELVIC EXAM: deferred   Labs: Results for orders placed or performed during the  hospital encounter of 09/28/18 (from the past 24 hour(s))  CBC with Differential/Platelet     Status: Abnormal   Collection Time: 09/28/18  8:40 PM  Result Value Ref Range   WBC 11.8 (H) 4.0 - 10.5 K/uL   RBC 3.43 (L) 3.87 - 5.11 MIL/uL   Hemoglobin 10.1 (L) 12.0 - 15.0 g/dL   HCT 32.3 (L) 36.0 - 46.0 %   MCV 94.2 80.0 - 100.0 fL   MCH 29.4 26.0 - 34.0 pg   MCHC 31.3 30.0 - 36.0 g/dL   RDW 23.7 (H) 11.5 - 15.5 %   Platelets 365 150 - 400 K/uL   nRBC 0.0 0.0 - 0.2 %   Neutrophils Relative % 49 %   Lymphocytes Relative 46 %   Monocytes Relative 4 %   Eosinophils Relative 1 %   Basophils Relative 0 %   Neutro Abs 5.8 1.7 - 7.7 K/uL   Lymphs Abs 5.4 (H) 0.7 - 4.0 K/uL   Monocytes Absolute 0.5 0.1 - 1.0 K/uL   Eosinophils Absolute 0.1 0.0 - 0.5 K/uL   Basophils Absolute 0.0 0.0 - 0.1 K/uL  Comprehensive metabolic panel     Status: Abnormal   Collection Time: 09/28/18  8:40 PM  Result Value Ref Range   Sodium 139 135 - 145 mmol/L   Potassium 3.9 3.5 - 5.1 mmol/L   Chloride 101 98 - 111 mmol/L   CO2 28 22 - 32 mmol/L   Glucose, Bld 82 70 - 99 mg/dL   BUN 19 6 - 20 mg/dL   Creatinine, Ser 0.63 0.44 - 1.00 mg/dL   Calcium 9.2 8.9 - 10.3 mg/dL   Total Protein 7.5 6.5 - 8.1 g/dL   Albumin 3.2 (L) 3.5 - 5.0 g/dL   AST 21 15 - 41 U/L   ALT 17 0 - 44 U/L   Alkaline Phosphatase 76 38 - 126 U/L   Total Bilirubin 0.4 0.3 - 1.2 mg/dL   GFR calc non Af Amer >60 >60 mL/min   GFR calc Af  Amer >60 >60 mL/min   Anion gap 10 5 - 15    --/--/A NEG (10/22 1029)  Imaging:  Dg Abd 2 Views  Result Date: 09/28/2018 CLINICAL DATA:  Abdominal distension, status post Cesarian section 4 days prior. EXAM: ABDOMEN - 2 VIEW COMPARISON:  None. FINDINGS: The bowel gas pattern is normal. Moderate amount of retained large bowel stool. Moderate pubic symphyseal osteoarthrosis. There is no evidence of free air. No radio-opaque calculi or other significant radiographic abnormality is seen. Widened pubic symphysis. IMPRESSION: 1. Moderate volume retained large bowel stool. Normal bowel gas pattern. 2. Widened pubic symphysis attributable to recent pregnancy. Electronically Signed   By: Elon Alas M.D.   On: 09/28/2018 21:48   MAU Course/MDM: I have ordered labs as follows:  CBC and CMET. Both are within normal limits Imaging ordered: Supine and upright abdominal films, showed stool burden and widened symphysis.  No evidence of ileus Results reviewed.   Consult Dr Nehemiah Settle.  OK to remove wound vac..   Treatments in MAU included removal of wound vac.and steristrips  Pt stable at time of discharge.  Assessment: Postoperative Day #5 Abdominal and incisional pain Abdominal distension with minimal bowel sounds, constipation  Plan: Discharge home Keep wound clean and dry Rx sent for Miralax for constipation Recommend ibuprofen for pain, use percocet as needed followup in office as scheduled  Encouraged to return here or to other Urgent Care/ED if she develops worsening of  symptoms, increase in pain, fever, or other concerning symptoms.   Hansel Feinstein CNM, MSN Certified Nurse-Midwife 09/28/2018 8:08 PM

## 2018-10-05 ENCOUNTER — Ambulatory Visit (HOSPITAL_BASED_OUTPATIENT_CLINIC_OR_DEPARTMENT_OTHER): Payer: Medicaid Other | Attending: Cardiovascular Disease

## 2018-10-06 ENCOUNTER — Ambulatory Visit: Payer: Medicaid Other | Admitting: Cardiovascular Disease

## 2018-10-06 ENCOUNTER — Encounter (HOSPITAL_COMMUNITY): Payer: Self-pay | Admitting: Cardiovascular Disease

## 2018-10-06 ENCOUNTER — Encounter (HOSPITAL_COMMUNITY): Payer: Self-pay

## 2018-10-06 ENCOUNTER — Ambulatory Visit (HOSPITAL_COMMUNITY)
Admission: RE | Admit: 2018-10-06 | Discharge: 2018-10-06 | Disposition: A | Discharge status: Home / Self Care | Payer: Medicaid Other | Source: Ambulatory Visit | Attending: Obstetrics and Gynecology | Admitting: Obstetrics and Gynecology

## 2018-10-07 ENCOUNTER — Ambulatory Visit: Payer: Medicaid Other

## 2018-10-11 ENCOUNTER — Telehealth: Payer: Self-pay

## 2018-10-11 NOTE — Telephone Encounter (Signed)
New message    Just an FYI. We have made several attempts to contact this patient including sending a letter to schedule or reschedule their echocardiogram. We will be removing the patient from the echo WQ.   Thank you 

## 2018-10-14 ENCOUNTER — Encounter: Payer: Self-pay | Admitting: Cardiovascular Disease

## 2018-10-20 ENCOUNTER — Encounter: Payer: Self-pay | Admitting: Family Medicine

## 2018-10-20 ENCOUNTER — Ambulatory Visit (INDEPENDENT_AMBULATORY_CARE_PROVIDER_SITE_OTHER): Payer: Medicaid Other

## 2018-10-20 ENCOUNTER — Ambulatory Visit: Payer: Medicaid Other

## 2018-10-20 VITALS — BP 121/85 | HR 74 | Wt 227.5 lb

## 2018-10-20 DIAGNOSIS — Z5189 Encounter for other specified aftercare: Secondary | ICD-10-CM

## 2018-10-20 NOTE — Progress Notes (Signed)
I have reviewed the chart and agree with nursing staff's documentation of this patient's encounter.  Laury Deep, CNM 10/20/2018 5:28 PM

## 2018-10-20 NOTE — Progress Notes (Signed)
Pt here for wound check,  states from incision is having odor but no drainage, nor bleeding & a little pain. Is taking Ibuprofen for pain. Helps sometimes.

## 2018-11-04 ENCOUNTER — Encounter: Payer: Self-pay | Admitting: Obstetrics and Gynecology

## 2018-11-04 ENCOUNTER — Ambulatory Visit (INDEPENDENT_AMBULATORY_CARE_PROVIDER_SITE_OTHER): Payer: Medicaid Other | Admitting: Obstetrics and Gynecology

## 2018-11-04 ENCOUNTER — Ambulatory Visit (INDEPENDENT_AMBULATORY_CARE_PROVIDER_SITE_OTHER): Payer: Self-pay | Admitting: Clinical

## 2018-11-04 VITALS — BP 144/95 | HR 73 | Wt 234.0 lb

## 2018-11-04 DIAGNOSIS — Z8751 Personal history of pre-term labor: Secondary | ICD-10-CM | POA: Insufficient documentation

## 2018-11-04 DIAGNOSIS — N912 Amenorrhea, unspecified: Secondary | ICD-10-CM

## 2018-11-04 DIAGNOSIS — I10 Essential (primary) hypertension: Secondary | ICD-10-CM

## 2018-11-04 DIAGNOSIS — Z6379 Other stressful life events affecting family and household: Secondary | ICD-10-CM

## 2018-11-04 HISTORY — DX: Personal history of pre-term labor: Z87.51

## 2018-11-04 LAB — POCT PREGNANCY, URINE: PREG TEST UR: NEGATIVE

## 2018-11-04 NOTE — BH Specialist Note (Signed)
Integrated Behavioral Health Initial Visit  MRN: [id number] Name: [name] [name]  Number of [facility] Clinician visits:: 1/6 Session Start time: 2:09  Session End time: 2:32 Total time: 20 minutes  Type of Service: [place] Interpretor:No. Interpretor Name and Language: n/a   Warm Hand Off Completed.       SUBJECTIVE: [name] [name] is a 32 y.o. female accompanied by n/a Patient was referred by Aletha Halim, MD for postpartum check . Patient reports the following symptoms/concerns: Pt states her primary concern today is she is having a difficult time sleeping at night, since she is not with her babies, and is alone. Pt's goals today are to keep up her milk supply, and to have her children back home with her. She has established care with therapist at [facility]. Pt states she used to cope with stress, with alcohol, but does not plan to consume alcohol again while breastfeeding/pumping milk.  Duration of problem: Postpartum; Severity of problem: mild  OBJECTIVE: Mood: Normal and Affect: Appropriate Risk of harm to self or others: No plan to harm self or others  LIFE CONTEXT: Family and Social: Pt lives by herself, has 7 children (oldest deceased, 10,9,6,3,twin newborns); pt's mother is supportive School/Work: - Self-Care: - Life Changes: Recent childbirth; children in CPS care  GOALS ADDRESSED: Patient will: 1. Reduce symptoms of: insomnia 2. Increase knowledge and/or ability of: healthy habits  3. Demonstrate ability to: Increase healthy adjustment to current life circumstances  INTERVENTIONS: Interventions utilized: Supportive Counseling and Psychoeducation and/or Health Education  Standardized Assessments completed: Not Needed  ASSESSMENT: Patient currently experiencing Stressful life event affecting family   Patient may benefit from psychoeducation and brief therapeutic interventions  regarding coping with symptoms of depression, anxiety, and safe alcohol use .  PLAN: 1. Follow up with behavioral health clinician on : As needed 2. Behavioral recommendations:  -Continue attending therapy sessions at [facility] working with current lawyer on CPS case -Begin taking OTC melatonin, as approved by medical provider; discontinue if sleep does not improve -Consider limiting alcoholic beverage to only 1 serving/day, when alcohol use resumes -Read educational materials regarding coping with symptoms of depression, anxiety, and safe alcohol use  3. Referral(s): [facility] (In Clinic) 4. "From scale of 1-10, how likely are you to follow plan?": -  Garlan Fair, LCSW  Depression screen [facility] 2/9 09/08/2018 08/18/2018 06/10/2018 05/20/2018 05/06/2018  Decreased Interest 1 0 0 0 0  Down, Depressed, Hopeless 1 1 0 0 0  PHQ - 2 Score 2 1 0 0 0  Altered sleeping 3 3 2 3  -  Tired, decreased energy 3 3 2 1  -  Change in appetite 2 1 1 2  -  Feeling bad or failure about yourself  1 3 0 1 -  Trouble concentrating 0 0 0 0 -  Moving slowly or fidgety/restless 0 0 0 0 -  Suicidal thoughts 0 0 0 0 -  PHQ-9 Score 11 11 5 7  -  Difficult doing work/chores - - - - -  Some recent data might be hidden   GAD 7 : Generalized Anxiety Score 09/08/2018 08/18/2018 06/10/2018 05/20/2018  Nervous, Anxious, on Edge 0 1 0 0  Control/stop worrying 1 1 1 1   Worry too much - different things 1 3 1 1   Trouble relaxing 1 1 0 0  Restless 2 0 0 0  Easily annoyed or irritable 1 1 2 2   Afraid -  awful might happen 2 1 0 1  Total GAD 7 Score 8 8 4  5

## 2018-11-04 NOTE — Progress Notes (Signed)
Obstetrics and Gynecology Postpartum Visit  Appointment Date: 11/04/2018  OBGYN Clinic: [facility]  Primary Care Provider: Kathrene Alu [facility] medicine clinic)  Chief Complaint:  Chief Complaint  Patient presents with  . Postpartum Care    History of Present Illness: [name] [name] is a 32 y.o. African-American [phone] (Patient's last menstrual period was 02/01/2018.), seen for the above chief complaint.   She is s/p rpt LTCS on 10/24 @ 33wks for PTL, malpresentation ; she was discharged to home on POD#4  Vaginal bleeding or discharge: No  Breast or formula feeding: pumping Intercourse: Yes  Contraception after delivery: No  Any bowel or bladder issues: No  Pap smear: no abnormalities (date: 2019)  Review of Systems: as noted in the History of Present Illness.  Medications None  Allergies Patient has no known allergies.  Physical Exam:  BP (!) 144/95   Pulse 73   Wt 234 lb (106.1 kg)   LMP 02/01/2018   BMI 42.80 kg/m  Body mass index is 42.8 kg/m. General appearance: Well nourished, well developed female in no acute distress.  Cardiovascular: normal s1 and s2.  No murmurs, rubs or gallops. Respiratory:  Clear to auscultation bilateral. Normal respiratory effort Abdomen: obese, soft, nttp, nd. Well healed low transverse incision Neuro/Psych:  Normal mood and affect.  Skin:  Warm and dry.   Laboratory: none  PP Depression Screening:  EPDS 12  Assessment: pt stable  Plan:  Will get UPT. D/w pt if no period by mid January to call us. It sounds like she had some aub usually had a period approx qmonth prior to last pregnancy. Strongly recommended BC to her; she states she doesn't want anymore kids but also doesn't want to do [facility]. Options d/w her and handout given. Risks of short interval pregnancy to her and risk of PTB d/w her.  Pt to see jamie with [facility] today  RTC PRN  Durene Romans MD Attending Center for [employer]  [facility])

## 2018-11-08 ENCOUNTER — Encounter: Payer: Self-pay | Admitting: Obstetrics and Gynecology

## 2019-01-05 IMAGING — CT CT ABD-PELV W/ CM
2 of 4 series · 17 of 46 positions shown, 19 images · IV contrast (Omni 300)
Comparison: 12/01/2010

CLINICAL DATA: Right lower quadrant abdominal pain.

EXAM:
CT ABDOMEN AND PELVIS WITH CONTRAST
TECHNIQUE: Multidetector CT imaging of the abdomen and pelvis was performed
using the standard protocol following bolus administration of
intravenous contrast.
CONTRAST:  100mL HST9ZR-[id number] IOPAMIDOL (HST9ZR-0EE) INJECTION 61%

[Series 3: a/p w/ 5mm · axial · 0.98mm/px · z∈[-447,-17]mm · 14 of 96 slices shown, 16 images]
[im 5/96  soft-tissue]
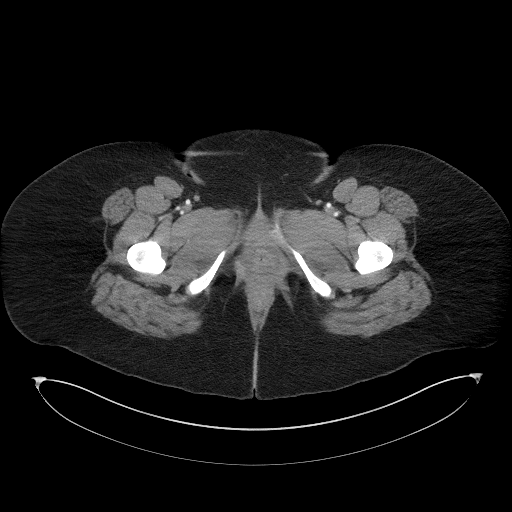
[im 5/96  bone]
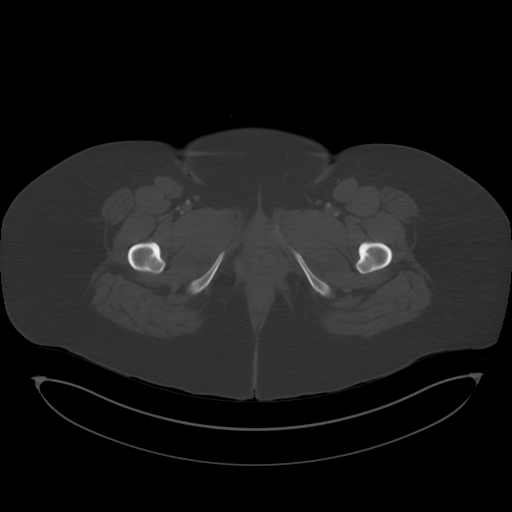
[im 13/96  soft-tissue]
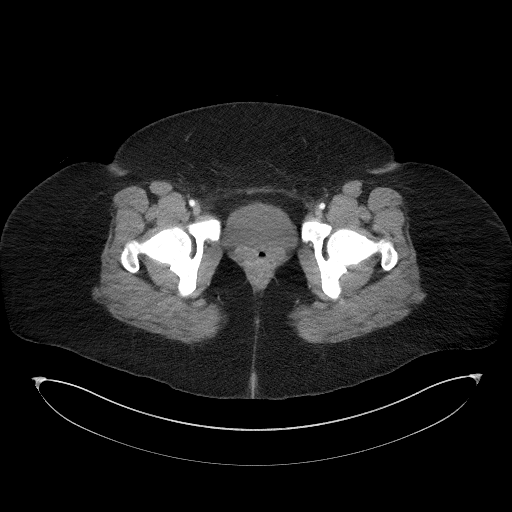
[im 18/96  soft-tissue]
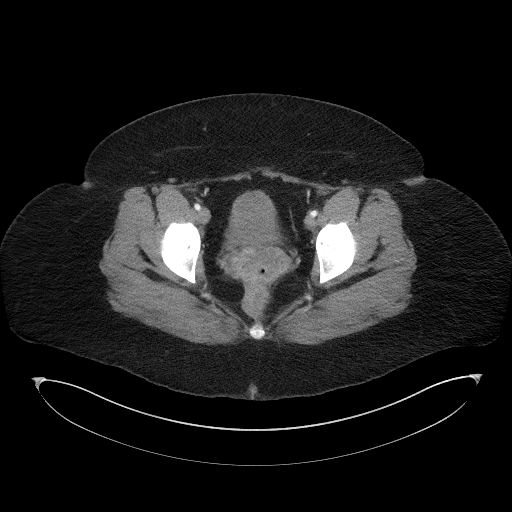
[im 26/96  soft-tissue]
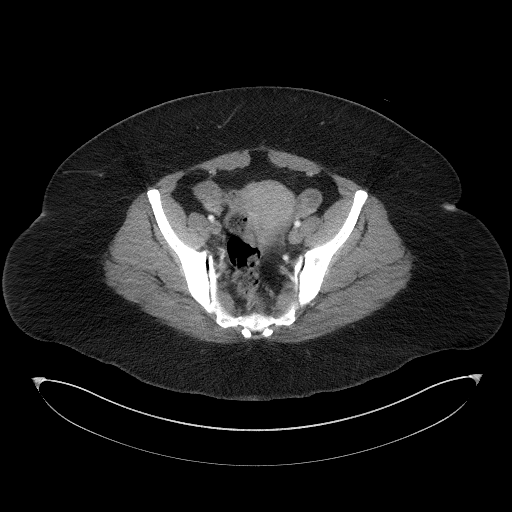
[im 31/96  soft-tissue]
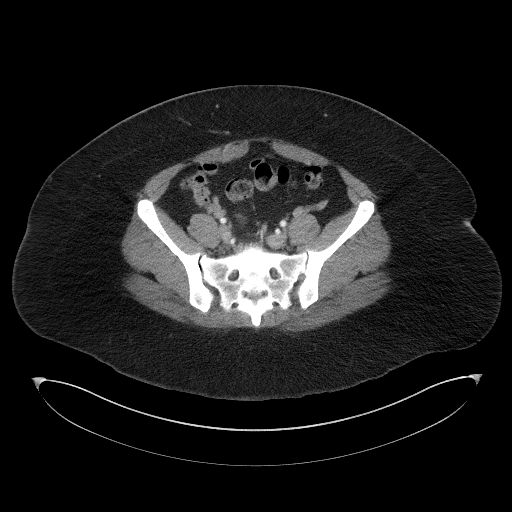
[im 39/96  soft-tissue]
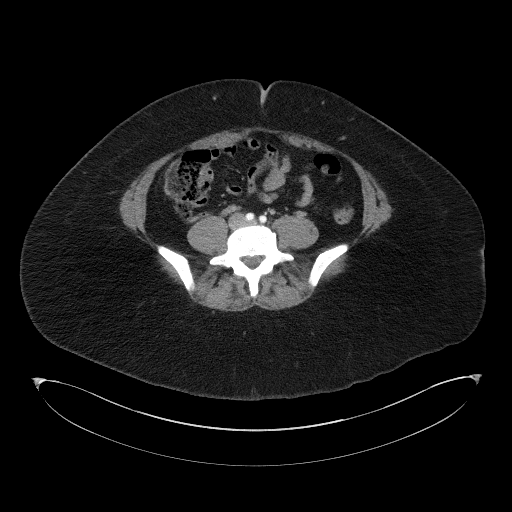
[im 44/96  soft-tissue]
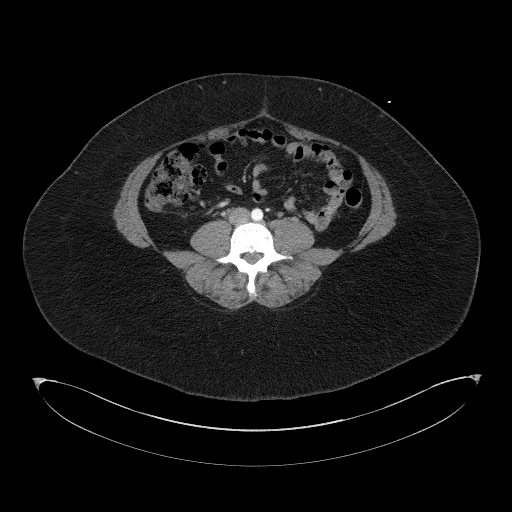
[im 52/96  soft-tissue]
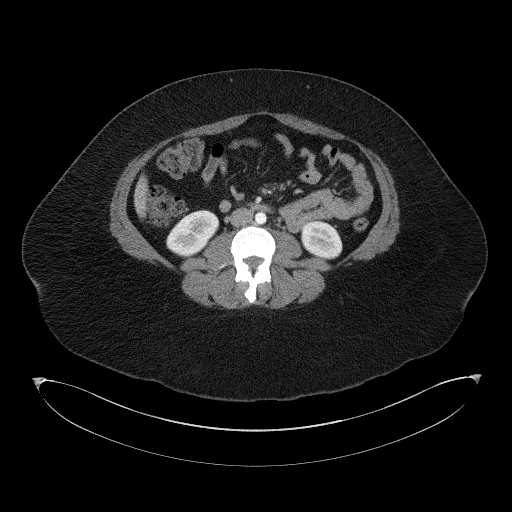
[im 57/96  soft-tissue]
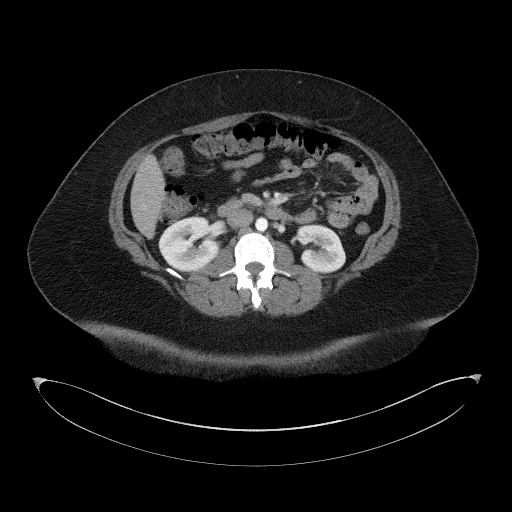
[im 57/96  bone]
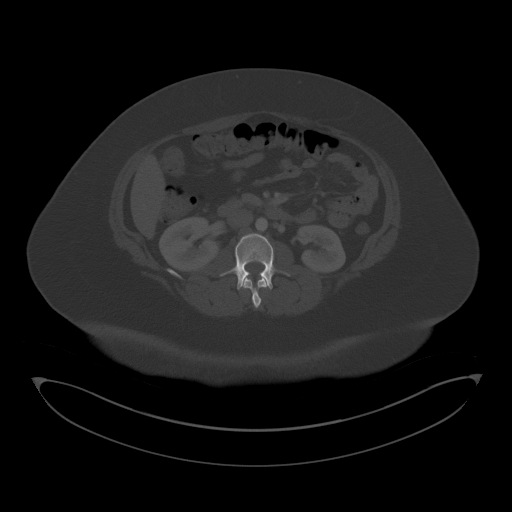
[im 65/96  soft-tissue]
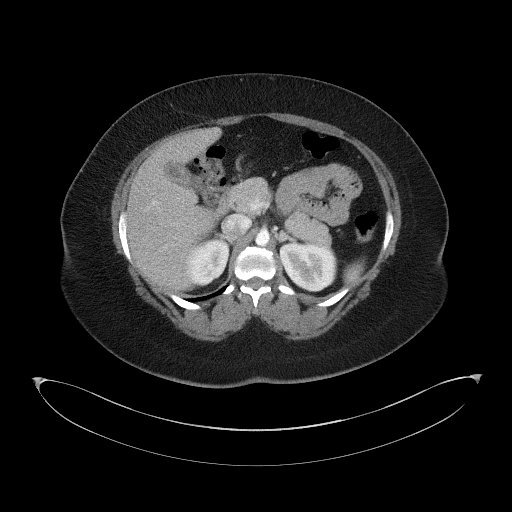
[im 70/96  soft-tissue]
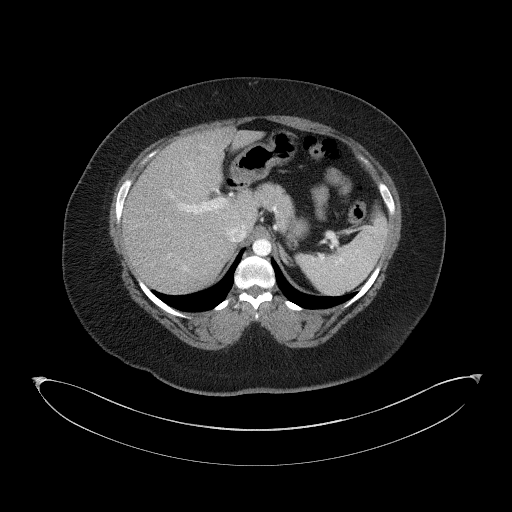
[im 78/96  soft-tissue]
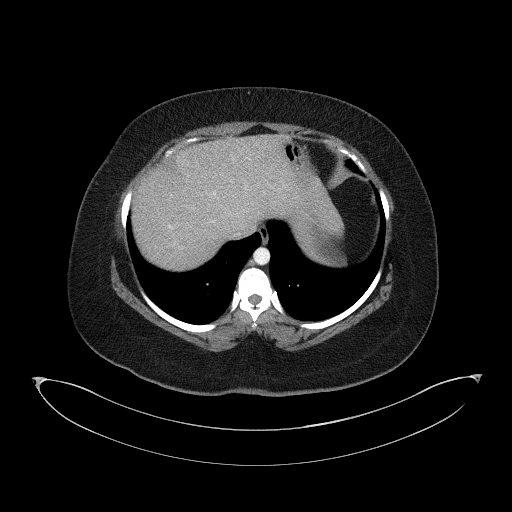
[im 83/96  soft-tissue]
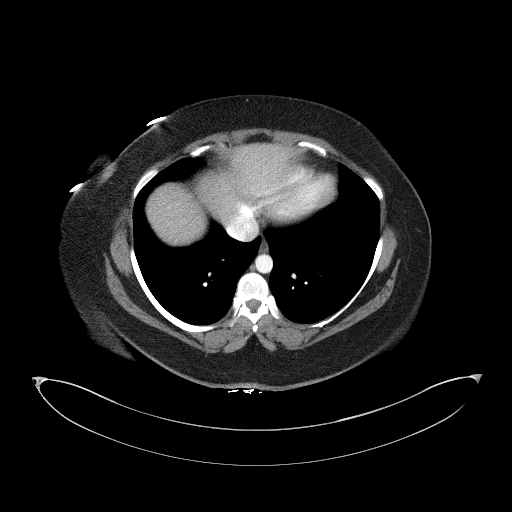
[im 91/96  soft-tissue]
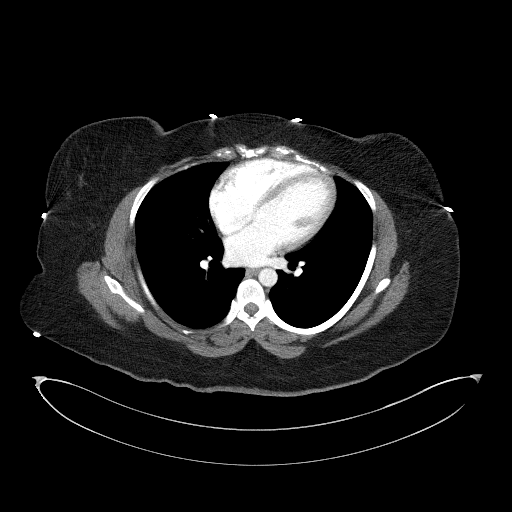

[Series 6: a/p w/ cor · coronal · 0.93mm/px · 3 of 178 slices shown]
[im 60/178  soft-tissue]
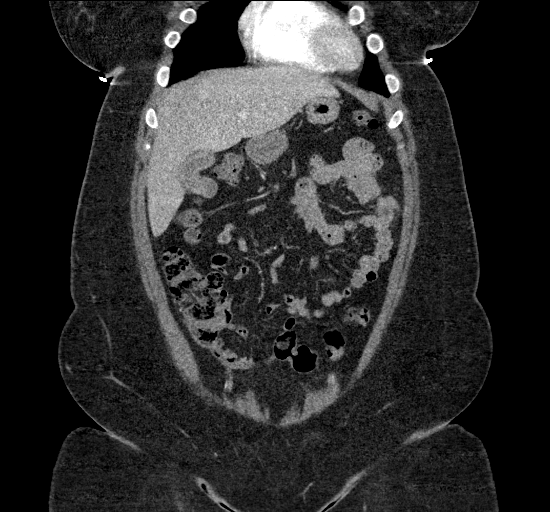
[im 79/178  soft-tissue]
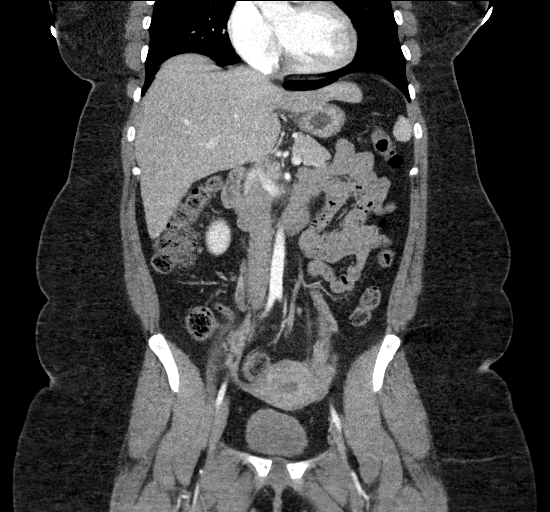
[im 99/178  soft-tissue]
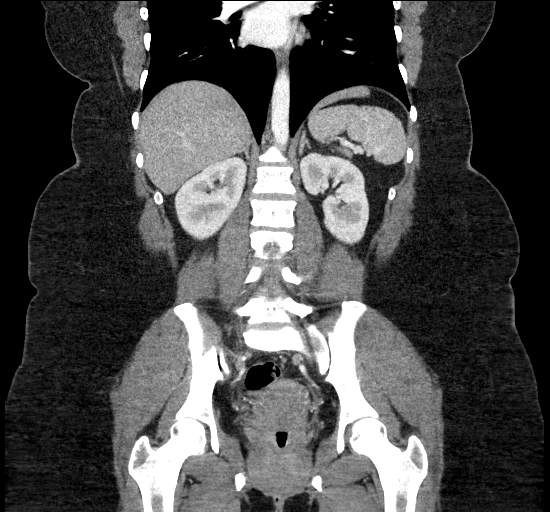

[17 of 46 positions shown; findings below may reference images not displayed]

FINDINGS: Lower chest: Normal

Hepatobiliary: Liver parenchyma is normal.  No calcified gallstones.

Pancreas: Normal

Spleen: Normal

Adrenals/Urinary Tract: Adrenal glands are normal. Kidneys are
normal. No cyst, mass, stone or hydronephrosis.

Stomach/Bowel: Normal appearing appendix. No other abnormal bowel
finding.

Vascular/Lymphatic: Normal

Reproductive: Uterus and adnexal regions appear normal. Normal
presumably functional ovarian cysts in a person of this age.

Other: No free fluid or air.

Musculoskeletal: Normal
IMPRESSION: Normal examination. No cause of right lower quadrant pain
identified. Normal appearing appendix.

## 2019-01-30 ENCOUNTER — Encounter (HOSPITAL_COMMUNITY): Payer: Self-pay | Admitting: Emergency Medicine

## 2019-01-30 ENCOUNTER — Emergency Department (HOSPITAL_COMMUNITY): Payer: Medicaid Other

## 2019-01-30 ENCOUNTER — Emergency Department (HOSPITAL_COMMUNITY)
Admission: EM | Admit: 2019-01-30 | Discharge: 2019-01-30 | Disposition: A | Discharge status: Home / Self Care | Payer: Medicaid Other | Attending: Emergency Medicine | Admitting: Emergency Medicine

## 2019-01-30 ENCOUNTER — Other Ambulatory Visit: Payer: Self-pay

## 2019-01-30 DIAGNOSIS — N760 Acute vaginitis: Secondary | ICD-10-CM | POA: Diagnosis not present

## 2019-01-30 DIAGNOSIS — B9689 Other specified bacterial agents as the cause of diseases classified elsewhere: Secondary | ICD-10-CM

## 2019-01-30 DIAGNOSIS — R0789 Other chest pain: Secondary | ICD-10-CM | POA: Diagnosis not present

## 2019-01-30 DIAGNOSIS — J029 Acute pharyngitis, unspecified: Secondary | ICD-10-CM | POA: Diagnosis present

## 2019-01-30 LAB — I-STAT BETA HCG BLOOD, ED (MC, WL, AP ONLY)

## 2019-01-30 LAB — CBC WITH DIFFERENTIAL/PLATELET
Abs Immature Granulocytes: 0.02 10*3/uL (ref 0.00–0.07)
Basophils Absolute: 0 10*3/uL (ref 0.0–0.1)
Basophils Relative: 0 %
Eosinophils Absolute: 0.2 10*3/uL (ref 0.0–0.5)
Eosinophils Relative: 3 %
HCT: 37.5 % (ref 36.0–46.0)
Hemoglobin: 12 g/dL (ref 12.0–15.0)
Immature Granulocytes: 0 %
Lymphocytes Relative: 42 %
Lymphs Abs: 2.9 10*3/uL (ref 0.7–4.0)
MCH: 31.8 pg (ref 26.0–34.0)
MCHC: 32 g/dL (ref 30.0–36.0)
MCV: 99.5 fL (ref 80.0–100.0)
Monocytes Absolute: 0.4 10*3/uL (ref 0.1–1.0)
Monocytes Relative: 6 %
Neutro Abs: 3.3 10*3/uL (ref 1.7–7.7)
Neutrophils Relative %: 49 %
Platelets: 282 10*3/uL (ref 150–400)
RBC: 3.77 MIL/uL — ABNORMAL LOW (ref 3.87–5.11)
RDW: 13.1 % (ref 11.5–15.5)
WBC: 6.8 10*3/uL (ref 4.0–10.5)
nRBC: 0 % (ref 0.0–0.2)

## 2019-01-30 LAB — WET PREP, GENITAL
Sperm: NONE SEEN
Trich, Wet Prep: NONE SEEN

## 2019-01-30 LAB — COMPREHENSIVE METABOLIC PANEL
ALT: 23 U/L (ref 0–44)
AST: 36 U/L (ref 15–41)
Albumin: 3.4 g/dL — ABNORMAL LOW (ref 3.5–5.0)
Alkaline Phosphatase: 51 U/L (ref 38–126)
Anion gap: 7 (ref 5–15)
BUN: <5 mg/dL — ABNORMAL LOW (ref 6–20)
CO2: 24 mmol/L (ref 22–32)
Calcium: 8.2 mg/dL — ABNORMAL LOW (ref 8.9–10.3)
Chloride: 108 mmol/L (ref 98–111)
Creatinine, Ser: 0.66 mg/dL (ref 0.44–1.00)
GFR calc non Af Amer: >60 mL/min (ref >60)
Glucose, Bld: 82 mg/dL (ref 70–99)
Potassium: 5.1 mmol/L (ref 3.5–5.1)
Sodium: 139 mmol/L (ref 135–145)
Total Bilirubin: 1.1 mg/dL (ref 0.3–1.2)
Total Protein: 6.8 g/dL (ref 6.5–8.1)

## 2019-01-30 LAB — URINALYSIS, ROUTINE W REFLEX MICROSCOPIC
Bilirubin Urine: NEGATIVE
Glucose, UA: NEGATIVE mg/dL
HGB URINE DIPSTICK: NEGATIVE
Ketones, ur: NEGATIVE mg/dL
Leukocytes,Ua: NEGATIVE
NITRITE: NEGATIVE
PROTEIN: NEGATIVE mg/dL
SPECIFIC GRAVITY, URINE: 1.02 (ref 1.005–1.030)
pH: 7 (ref 5.0–8.0)

## 2019-01-30 LAB — TROPONIN I: Troponin I: <0.03 ng/mL (ref <0.03)

## 2019-01-30 LAB — PREGNANCY, URINE: Preg Test, Ur: NEGATIVE

## 2019-01-30 MED ORDER — METRONIDAZOLE 500 MG PO TABS: 500.0000 mg | ORAL_TABLET | Freq: Two times a day (BID) | ORAL | 0 refills | Status: DC

## 2019-01-30 NOTE — ED Triage Notes (Signed)
Pt reports bilat ear pain, sore throat x 1-2 weeks with nonproductive cough with intermittent SOB with exertion x 3.  Pt reports dysuria with frequent urination, reports diarrhea, states, "That's been going on for months."
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
## 2019-01-30 NOTE — ED Notes (Addendum)
Discharge instructions and prescription discussed with Pt. Pt verbalized understanding. Pt stable and ambulatory.    

## 2019-01-30 NOTE — Discharge Instructions (Signed)
Flagyl as prescribed.  We will call you if your cultures indicate you require further treatment or action.  Follow-up with your primary doctor if your symptoms or not improving in the next week.

## 2019-01-31 LAB — GC/CHLAMYDIA PROBE AMP (~~LOC~~) NOT AT ARMC
Chlamydia: NEGATIVE
Neisseria Gonorrhea: NEGATIVE

## 2019-02-01 LAB — HIV ANTIBODY (ROUTINE TESTING W REFLEX): HIV Screen 4th Generation wRfx: NONREACTIVE

## 2019-05-09 ENCOUNTER — Inpatient Hospital Stay (HOSPITAL_COMMUNITY): Payer: Medicaid Other

## 2019-05-09 ENCOUNTER — Other Ambulatory Visit: Payer: Self-pay

## 2019-05-09 ENCOUNTER — Inpatient Hospital Stay (HOSPITAL_COMMUNITY)
Admission: AD | Admit: 2019-05-09 | Discharge: 2019-05-09 | Disposition: A | Discharge status: Home / Self Care | Payer: Medicaid Other | Attending: Obstetrics & Gynecology | Admitting: Obstetrics & Gynecology

## 2019-05-09 ENCOUNTER — Encounter (HOSPITAL_COMMUNITY): Payer: Self-pay | Admitting: Emergency Medicine

## 2019-05-09 DIAGNOSIS — O360110 Maternal care for anti-D [Rh] antibodies, first trimester, not applicable or unspecified: Secondary | ICD-10-CM | POA: Diagnosis not present

## 2019-05-09 DIAGNOSIS — Z3A10 10 weeks gestation of pregnancy: Secondary | ICD-10-CM | POA: Diagnosis not present

## 2019-05-09 DIAGNOSIS — Z3A09 9 weeks gestation of pregnancy: Secondary | ICD-10-CM | POA: Insufficient documentation

## 2019-05-09 DIAGNOSIS — O26891 Other specified pregnancy related conditions, first trimester: Secondary | ICD-10-CM | POA: Diagnosis not present

## 2019-05-09 DIAGNOSIS — Z6791 Unspecified blood type, Rh negative: Secondary | ICD-10-CM | POA: Insufficient documentation

## 2019-05-09 DIAGNOSIS — O4691 Antepartum hemorrhage, unspecified, first trimester: Secondary | ICD-10-CM | POA: Diagnosis not present

## 2019-05-09 DIAGNOSIS — O26899 Other specified pregnancy related conditions, unspecified trimester: Secondary | ICD-10-CM

## 2019-05-09 DIAGNOSIS — Z3490 Encounter for supervision of normal pregnancy, unspecified, unspecified trimester: Secondary | ICD-10-CM

## 2019-05-09 DIAGNOSIS — O4690 Antepartum hemorrhage, unspecified, unspecified trimester: Secondary | ICD-10-CM

## 2019-05-09 DIAGNOSIS — O209 Hemorrhage in early pregnancy, unspecified: Secondary | ICD-10-CM | POA: Insufficient documentation

## 2019-05-09 DIAGNOSIS — Z8679 Personal history of other diseases of the circulatory system: Secondary | ICD-10-CM

## 2019-05-09 LAB — CBC WITH DIFFERENTIAL/PLATELET
Abs Immature Granulocytes: 0.03 10*3/uL (ref 0.00–0.07)
Basophils Absolute: 0 10*3/uL (ref 0.0–0.1)
Basophils Relative: 0 %
Eosinophils Absolute: 0.1 10*3/uL (ref 0.0–0.5)
Eosinophils Relative: 1 %
HCT: 34.6 % — ABNORMAL LOW (ref 36.0–46.0)
Hemoglobin: 11.3 g/dL — ABNORMAL LOW (ref 12.0–15.0)
Immature Granulocytes: 0 %
Lymphocytes Relative: 39 %
Lymphs Abs: 3.4 10*3/uL (ref 0.7–4.0)
MCH: 32.4 pg (ref 26.0–34.0)
MCHC: 32.7 g/dL (ref 30.0–36.0)
MCV: 99.1 fL (ref 80.0–100.0)
Monocytes Absolute: 0.4 10*3/uL (ref 0.1–1.0)
Monocytes Relative: 5 %
Neutro Abs: 4.7 10*3/uL (ref 1.7–7.7)
Neutrophils Relative %: 55 %
Platelets: 361 10*3/uL (ref 150–400)
RBC: 3.49 MIL/uL — ABNORMAL LOW (ref 3.87–5.11)
RDW: 12.9 % (ref 11.5–15.5)
WBC: 8.6 10*3/uL (ref 4.0–10.5)
nRBC: 0 % (ref 0.0–0.2)

## 2019-05-09 LAB — BASIC METABOLIC PANEL
Anion gap: 10 (ref 5–15)
BUN: 9 mg/dL (ref 6–20)
CO2: 25 mmol/L (ref 22–32)
Calcium: 9.4 mg/dL (ref 8.9–10.3)
Chloride: 103 mmol/L (ref 98–111)
Creatinine, Ser: 0.61 mg/dL (ref 0.44–1.00)
GFR calc Af Amer: >60 mL/min (ref >60)
GFR calc non Af Amer: >60 mL/min (ref >60)
Glucose, Bld: 91 mg/dL (ref 70–99)
Potassium: 3.9 mmol/L (ref 3.5–5.1)
Sodium: 138 mmol/L (ref 135–145)

## 2019-05-09 LAB — WET PREP, GENITAL
Clue Cells Wet Prep HPF POC: NONE SEEN
Sperm: NONE SEEN
Trich, Wet Prep: NONE SEEN
Yeast Wet Prep HPF POC: NONE SEEN

## 2019-05-09 LAB — URINALYSIS, ROUTINE W REFLEX MICROSCOPIC
Bilirubin Urine: NEGATIVE
Glucose, UA: NEGATIVE mg/dL
Ketones, ur: NEGATIVE mg/dL
Leukocytes,Ua: NEGATIVE
Nitrite: NEGATIVE
Protein, ur: NEGATIVE mg/dL
Specific Gravity, Urine: 1.023 (ref 1.005–1.030)
pH: 6 (ref 5.0–8.0)

## 2019-05-09 LAB — HCG, QUANTITATIVE, PREGNANCY: hCG, Beta Chain, Quant, S: 108238 m[IU]/mL — ABNORMAL HIGH (ref <5)

## 2019-05-09 LAB — I-STAT BETA HCG BLOOD, ED (MC, WL, AP ONLY): I-stat hCG, quantitative: >2000 m[IU]/mL — ABNORMAL HIGH (ref <5)

## 2019-05-09 MED ORDER — RHO D IMMUNE GLOBULIN 1500 UNIT/2ML IJ SOSY
300.0000 ug | PREFILLED_SYRINGE | Freq: Once | INTRAMUSCULAR | Status: AC
  Administered 2019-05-09: 300 ug via INTRAMUSCULAR
  Filled 2019-05-09: qty 2

## 2019-05-09 NOTE — MAU Note (Signed)
Marquerite Forsman is a 33 y.o. at [redacted]w[redacted]d here in MAU reporting: vaginal bleeding on and off since April . Lower abdominal cramping LMP: 03/02/2019 Onset of complaint: a month Pain score:5 Vitals:   05/09/19 1209  BP: 132/81  Pulse: 88  Resp: 18  Temp: 98.8 F (37.1 C)  SpO2: 100%      Lab orders placed from triage: UA

## 2019-05-09 NOTE — Discharge Instructions (Signed)
[facility]     Phone: [phone]  [facility] at [place]  Phone: [phone]  [facility] [place]  Phone: [place] [facility] at [place]                           Phone: [place] [facility] [place] at [facility]          Phone: [phone]  [facility]    Phone: [phone]   [facility] [place])    Phone: [facility] [facility]    Phone: [phone]  [facility]    Phone: [place]    Phone: [phone]  [place] [facility]  Phone: [place]               Phone: [phone]  [facility]   Phone: [phone]  [facility]    Phone: [phone]   Vaginal Bleeding During Pregnancy, First Trimester  A small amount of bleeding from the vagina (spotting) is relatively common during early pregnancy. It usually stops on its own. Various things may cause bleeding or spotting during early pregnancy. Some bleeding may be related to the pregnancy, and some may not. In many cases, the bleeding is normal and is not a problem. However, bleeding can also be a sign of something serious. Be sure to tell your health care provider about any vaginal bleeding right away. Some possible causes of vaginal bleeding during the first trimester include:  Infection or inflammation of the cervix.  Growths (polyps) on the cervix.  Miscarriage or threatened miscarriage.  Pregnancy tissue developing outside of the uterus (ectopic pregnancy).  A mass of tissue developing in the uterus due to an egg being fertilized incorrectly (molar pregnancy). Follow these instructions at home: Activity  Follow instructions from your health care  provider about limiting your activity. Ask what activities are safe for you.  If needed, make plans for someone to help with your regular activities.  Do not have sex or orgasms until your health care provider says that this is safe. General instructions  Take over-the-counter and prescription medicines only as told by your health care provider.  Pay attention to any changes in your symptoms.  Do not use tampons or douche.  Write down how many pads you use each day, how often you change pads, and how soaked (saturated) they are.  If you pass any tissue from your vagina, save the tissue so you can show it to your health care provider.  Keep all follow-up visits as told by your health care provider. This is important. Contact a health care provider if:  You have vaginal bleeding during any part of your pregnancy.  You have cramps or labor pains.  You have a fever. Get help right away if:  You have severe cramps in your back or abdomen.  You pass large clots or a large amount of tissue from your vagina.  Your bleeding increases.  You feel light-headed or weak, or you faint.  You have chills.  You are leaking fluid or have a gush of fluid from your vagina. Summary  A small amount of bleeding (spotting) from the vagina is relatively common during early pregnancy.  Various things may cause bleeding or spotting in early  pregnancy.  Be sure to tell your health care provider about any vaginal bleeding right away. This information is not intended to replace advice given to you by your health care provider. Make sure you discuss any questions you have with your health care provider. Document Released: 08/27/2005 Document Revised: 02/19/2017 Document Reviewed: 02/19/2017 Elsevier Interactive Patient Education  2019 [employer].

## 2019-05-09 NOTE — ED Triage Notes (Signed)
Pt reports presents with vaginal bleeding since April 25 to May 25 and then came back heavy again. Pt denies going to her OBGYN for care but is now concerned because this morning there were clots.

## 2019-05-09 NOTE — MAU Provider Note (Signed)
History     CSN: [id number]  Arrival date and time: 05/09/19 1131   First Provider Initiated Contact with Patient 05/09/19 1525      Chief Complaint  Patient presents with  . Vaginal Bleeding   [name] is a 33 y.o. [id number] at [redacted]w[redacted]d who presents to MAU for vaginal bleeding which began 1 month ago.  Passing blood clots? yes, since last Thursday, largest one the size of a half dollar or larger Blood soaking clothes? no Lightheaded/dizzy? no Significant pelvic pain or cramping? yes, rates 5/10 Passed any tissue? no  Current pregnancy problems? pt just found out today that she was pregnant Blood Type? A NEGATIVE Allergies? NKDA Current medications? Fioricet PRN, Flexeril PRN  Pt denies vaginal discharge/odor/itching. Pt denies N/V, abdominal pain, constipation, diarrhea, or urinary problems. Pt denies fever, chills, fatigue, sweating or changes in appetite. Pt denies SOB or chest pain. Pt denies dizziness, HA, light-headedness, weakness.   OB History    Gravida  7   Para  6   Term  5   Preterm  1   AB  0   Living  7     SAB  0   TAB  0   Ectopic  0   Multiple  1   Live Births  7           Past Medical History:  Diagnosis Date  . Abnormal Pap smear    f/u was normal  . Chronic hypertension   . DEPRESSION, MAJOR, RECURRENT, MODERATE 04/18/2010   Qualifier: Diagnosis of  By: Georgina Snell MD, Ellard Artis    . Headache(784.0)   . History of abnormal Pap smear 07/28/2012  . History of preterm delivery 11/04/2018  . Infection    trich, chlamydia, gonorrhea  . Ovarian cyst   . Urinary tract infection     Past Surgical History:  Procedure Laterality Date  . CESAREAN SECTION    . CESAREAN SECTION N/A 09/23/2018   Procedure: CESAREAN SECTION;  Surgeon: Mora Bellman, MD;  Location: [place];  Service: Obstetrics;  Laterality: N/A;  . TONSILLECTOMY    . WISDOM TOOTH EXTRACTION      Family History  Problem Relation Age of Onset  .  Hypertension Mother   . Arthritis Mother   . Hypertension Father   . Asthma Brother   . Cancer Maternal Uncle        lung  . Diabetes Maternal Grandmother   . Cancer Maternal Grandmother        thyroid  . Hypertension Maternal Grandmother   . Other Neg Hx     Social History   Tobacco Use  . Smoking status: Never Smoker  . Smokeless tobacco: Never Used  Substance Use Topics  . Alcohol use: Not Currently    Alcohol/week: 2.0 standard drinks    Types: 1 Glasses of wine, 1 Shots of liquor per week    Comment: Not currently  . Drug use: Not Currently    Types: Marijuana    Comment: Not currently    Allergies: No Known Allergies  Medications Prior to Admission  Medication Sig Dispense Refill Last Dose  . Butalbital-APAP-Caffeine 50-325-40 MG capsule Take 1 capsule by mouth every 6 (six) hours as needed for headache. (Patient not taking: Reported on 10/20/2018) 30 capsule 0 Not Taking  . Elastic Bandages & Supports (ABDOMINAL BINDER/ELASTIC XL) MISC 1 Units by Does not apply route as needed. (Patient not taking: Reported on 10/20/2018) 1 each 0 Not Taking  .  enalapril (VASOTEC) 10 MG tablet Take 1 tablet (10 mg total) by mouth daily. (Patient not taking: Reported on 10/20/2018) 30 tablet 1 Not Taking  . hydrochlorothiazide (HYDRODIURIL) 25 MG tablet Take 1 tablet (25 mg total) by mouth daily. (Patient not taking: Reported on 10/20/2018) 30 tablet 0 Not Taking  . ibuprofen (ADVIL,MOTRIN) 600 MG tablet Take 1 tablet (600 mg total) by mouth every 6 (six) hours. 30 tablet 0 Taking  . Magnesium Oxide (MAGNESIUM OXIDE 400) 240 MG PACK Take 1 tablet by mouth daily as needed (Headache). (Patient not taking: Reported on 10/20/2018)   Not Taking  . metroNIDAZOLE (FLAGYL) 500 MG tablet Take 1 tablet (500 mg total) by mouth 2 (two) times daily. One po bid x 7 days 14 tablet 0   . oxyCODONE (OXY IR/ROXICODONE) 5 MG immediate release tablet Take 1 tablet (5 mg total) by mouth every 4 (four) hours  as needed (pain scale 4-7). 30 tablet 0 Taking  . polyethylene glycol (MIRALAX) packet Take 17 g by mouth daily. (Patient not taking: Reported on 10/20/2018) 14 each 0 Not Taking  . Prenatal MV & Min w/FA-DHA (PRENATAL ADULT GUMMY/DHA/FA PO) Take 2 tablets by mouth daily.   Not Taking  . valACYclovir (VALTREX) 1000 MG tablet Take 1 tablet (1,000 mg total) by mouth daily. (Patient not taking: Reported on 10/20/2018) 30 tablet 1 Not Taking    Review of Systems  Constitutional: Negative for chills, diaphoresis, fatigue and fever.  Respiratory: Negative for shortness of breath.   Cardiovascular: Negative for chest pain.  Gastrointestinal: Negative for abdominal pain, constipation, diarrhea, nausea and vomiting.  Genitourinary: Positive for pelvic pain and vaginal bleeding. Negative for decreased urine volume, dysuria, flank pain, frequency and vaginal discharge.  Neurological: Negative for dizziness, weakness, light-headedness and headaches.   Physical Exam   Blood pressure (!) 151/73, pulse 94, temperature 98.9 F (37.2 C), resp. rate 16, height 5\' 2"  (1.575 m), weight 106.6 kg, last menstrual period 03/02/2019, SpO2 100 %, unknown if currently breastfeeding.  Patient Vitals for the past 24 hrs:  BP Temp Temp src Pulse Resp SpO2 Height Weight  05/09/19 1454 (!) 151/73 98.9 F (37.2 C) - 94 16 100 % - -  05/09/19 1211 - - - - - - 5\' 2"  (1.575 m) 106.6 kg  05/09/19 1209 132/81 98.8 F (37.1 C) Oral 88 18 100 % - -   Physical Exam  Constitutional: She is oriented to person, place, and time. She appears well-developed and well-nourished. No distress.  HENT:  Head: Normocephalic and atraumatic.  Respiratory: Effort normal.  GI: Soft. She exhibits no distension and no mass. There is no abdominal tenderness. There is no rebound and no guarding.  Genitourinary: There is no rash, tenderness or lesion on the right labia. There is no rash, tenderness or lesion on the left labia. Uterus is  enlarged. Uterus is not fixed and not tender. Cervix exhibits no motion tenderness, no discharge and no friability. Right adnexum displays no mass, no tenderness and no fullness. Left adnexum displays no mass, no tenderness and no fullness.    Vaginal bleeding present.     No vaginal discharge or tenderness.  There is bleeding in the vagina. No tenderness in the vagina.  Neurological: She is alert and oriented to person, place, and time.  Skin: Skin is warm and dry. She is not diaphoretic.  Psychiatric: She has a normal mood and affect. Her behavior is normal. Judgment and thought content normal.   Results for  orders placed or performed during the hospital encounter of 05/09/19 (from the past 24 hour(s))  Rh IG workup (includes ABO/Rh)     Status: None (Preliminary result)   Collection Time: 05/09/19 12:16 PM  Result Value Ref Range   Gestational Age(Wks) 9    ABO/RH(D) A NEG    Antibody Screen NEG    Unit Number [id number]    Blood Component Type RHIG    Unit division 00    Status of Unit ISSUED    Transfusion Status      OK TO TRANSFUSE Performed at [facility] [address] [place], [place] [address]   I-Stat beta hCG blood, ED     Status: Abnormal   Collection Time: 05/09/19 12:52 PM  Result Value Ref Range   I-stat hCG, quantitative >2,000.0 (H) <5 mIU/mL   Comment 3          CBC with Differential     Status: Abnormal   Collection Time: 05/09/19  1:00 PM  Result Value Ref Range   WBC 8.6 4.0 - 10.5 K/uL   RBC 3.49 (L) 3.87 - 5.11 MIL/uL   Hemoglobin 11.3 (L) 12.0 - 15.0 g/dL   HCT 34.6 (L) 36.0 - 46.0 %   MCV 99.1 80.0 - 100.0 fL   MCH 32.4 26.0 - 34.0 pg   MCHC 32.7 30.0 - 36.0 g/dL   RDW 12.9 11.5 - 15.5 %   Platelets 361 150 - 400 K/uL   nRBC 0.0 0.0 - 0.2 %   Neutrophils Relative % 55 %   Neutro Abs 4.7 1.7 - 7.7 K/uL   Lymphocytes Relative 39 %   Lymphs Abs 3.4 0.7 - 4.0 K/uL   Monocytes Relative 5 %   Monocytes Absolute 0.4 0.1 - 1.0 K/uL    Eosinophils Relative 1 %   Eosinophils Absolute 0.1 0.0 - 0.5 K/uL   Basophils Relative 0 %   Basophils Absolute 0.0 0.0 - 0.1 K/uL   Immature Granulocytes 0 %   Abs Immature Granulocytes 0.03 0.00 - 0.07 K/uL  Basic metabolic panel     Status: None   Collection Time: 05/09/19  1:00 PM  Result Value Ref Range   Sodium 138 135 - 145 mmol/L   Potassium 3.9 3.5 - 5.1 mmol/L   Chloride 103 98 - 111 mmol/L   CO2 25 22 - 32 mmol/L   Glucose, Bld 91 70 - 99 mg/dL   BUN 9 6 - 20 mg/dL   Creatinine, Ser 0.61 0.44 - 1.00 mg/dL   Calcium 9.4 8.9 - 10.3 mg/dL   GFR calc non Af Amer >60 >60 mL/min   GFR calc Af Amer >60 >60 mL/min   Anion gap 10 5 - 15  hCG, quantitative, pregnancy     Status: Abnormal   Collection Time: 05/09/19  1:00 PM  Result Value Ref Range   hCG, Beta Chain, Quant, S 108,238 (H) <5 mIU/mL  Urinalysis, Routine w reflex microscopic     Status: Abnormal   Collection Time: 05/09/19  3:20 PM  Result Value Ref Range   Color, Urine YELLOW YELLOW   APPearance HAZY (A) CLEAR   Specific Gravity, Urine 1.023 1.005 - 1.030   pH 6.0 5.0 - 8.0   Glucose, UA NEGATIVE NEGATIVE mg/dL   Hgb urine dipstick LARGE (A) NEGATIVE   Bilirubin Urine NEGATIVE NEGATIVE   Ketones, ur NEGATIVE NEGATIVE mg/dL   Protein, ur NEGATIVE NEGATIVE mg/dL   Nitrite NEGATIVE NEGATIVE  Leukocytes,Ua NEGATIVE NEGATIVE   RBC / HPF 0-5 0 - 5 RBC/hpf   WBC, UA 0-5 0 - 5 WBC/hpf   Bacteria, UA RARE (A) NONE SEEN   Squamous Epithelial / LPF 0-5 0 - 5  Wet prep, genital     Status: Abnormal   Collection Time: 05/09/19  3:38 PM  Result Value Ref Range   Yeast Wet Prep HPF POC NONE SEEN NONE SEEN   Trich, Wet Prep NONE SEEN NONE SEEN   Clue Cells Wet Prep HPF POC NONE SEEN NONE SEEN   WBC, Wet Prep HPF POC MANY (A) NONE SEEN   Sperm NONE SEEN    US Ob Comp Less 14 Wks  Result Date: 05/09/2019 CLINICAL DATA:  33 year old female reportedly 9 weeks 5 days pregnant with intermittent vaginal bleeding EXAM:  OBSTETRIC <14 WK [place] AND TRANSVAGINAL OB US TECHNIQUE: Both transabdominal and transvaginal ultrasound examinations were performed for complete evaluation of the gestation as well as the maternal uterus, adnexal regions, and pelvic cul-de-sac. Transvaginal technique was performed to assess early pregnancy. COMPARISON:  None. FINDINGS: Intrauterine gestational sac: Single Yolk sac:  Not Visualized. Embryo:  Visualized. Cardiac Activity: Visualized. Heart Rate: 166 bpm CRL:  31.7 mm   10 w   0 d                  [place] EDC: December 05, 2019 Subchorionic hemorrhage: Small amount of crescentic hypoechogenicity deep to the chorion. Findings consistent with small subchorionic hemorrhage. Maternal uterus/adnexae: Both ovaries are well visualized and are unremarkable in appearance. IMPRESSION: 1. Single viable intrauterine pregnancy. The fetal heart rate is 166 beats per minute. By crown-rump length, the estimated gestational age is 10 weeks 0 days. 2. Small subchorionic hemorrhage noted. Electronically Signed   By: Jacqulynn Cadet M.D.   On: 05/09/2019 16:18    MAU Course  Procedures  MDM -r/o ectopic work-up -UA: hazy/lg hgb/rare bacteria -CBC w/diff: WNL for pregnancy (performed by ED) -[place]: single IUP, [redacted]w[redacted]d, sm subchorionic hemorrhage, FHB 166 -hCG: 053,976 -ABO/RhoGAM: A NEGATIVE, RhoGAM report negative for antibodies, OK to transfuse -WetPrep: many WBCs, otherwise WNL -GC/CT collected -RhoGAM given -spoke with Dr. Rosana Hoes @1706  re: cHTN, pt to be discharged without BP medications and pt to be instructed to call OB of choice tomorrow @0800  to schedule NOB appt and discuss BP meds in pregnancy -pt discharged to home in stable condition  Orders Placed This Encounter  Procedures  . Wet prep, genital    Standing Status:   Standing    Number of Occurrences:   1  . US OB Comp Less 14 Wks    Standing Status:   Standing    Number of Occurrences:   1    Order Specific Question:   Symptom/Reason for Exam     Answer:   Vaginal bleeding in pregnancy [[id number]]  . CBC with Differential    Standing Status:   Standing    Number of Occurrences:   1  . Basic metabolic panel    Standing Status:   Standing    Number of Occurrences:   1  . Urinalysis, Routine w reflex microscopic    Standing Status:   Standing    Number of Occurrences:   1  . hCG, quantitative, pregnancy    Standing Status:   Standing    Number of Occurrences:   1  . Diet NPO time specified    Standing Status:   Standing    Number of Occurrences:  1  . Pelvic cart to bedside    Standing Status:   Standing    Number of Occurrences:   1  . I-Stat beta hCG blood, ED    Standing Status:   Standing    Number of Occurrences:   1  . Rh IG workup (includes ABO/Rh)    Standing Status:   Standing    Number of Occurrences:   1    Order Specific Question:   Weeks of Gestation    Answer:   9  . Discharge patient    Order Specific Question:   Discharge disposition    Answer:   01-Home or Self Care [1]    Order Specific Question:   Discharge patient date    Answer:   05/09/2019   Meds ordered this encounter  Medications  . rho (d) immune globulin (RHIG/RHOPHYLAC) injection 300 mcg   Assessment and Plan   1. Vaginal bleeding in pregnancy   2. Intrauterine pregnancy   3. [redacted] weeks gestation of pregnancy   4. Rh negative state in antepartum period    Allergies as of 05/09/2019   No Known Allergies     Medication List    STOP taking these medications   enalapril 10 MG tablet Commonly known as:  VASOTEC   hydrochlorothiazide 25 MG tablet Commonly known as:  HYDRODIURIL   ibuprofen 600 MG tablet Commonly known as:  ADVIL     TAKE these medications   Abdominal Binder/Elastic XL Misc 1 Units by Does not apply route as needed.   Butalbital-APAP-Caffeine 50-325-40 MG capsule Take 1 capsule by mouth every 6 (six) hours as needed for headache.   Magnesium Oxide 240 MG Pack Commonly known as:  Magnesium Oxide 400 Take 1 tablet  by mouth daily as needed (Headache).   metroNIDAZOLE 500 MG tablet Commonly known as:  Flagyl Take 1 tablet (500 mg total) by mouth 2 (two) times daily. One po bid x 7 days   oxyCODONE 5 MG immediate release tablet Commonly known as:  Oxy IR/ROXICODONE Take 1 tablet (5 mg total) by mouth every 4 (four) hours as needed (pain scale 4-7).   polyethylene glycol 17 g packet Commonly known as:  MiraLax Take 17 g by mouth daily.   PRENATAL ADULT GUMMY/DHA/FA PO Take 2 tablets by mouth daily.   valACYclovir 1000 MG tablet Commonly known as:  VALTREX Take 1 tablet (1,000 mg total) by mouth daily.      -will call with culture results, if positive -list of OB providers in [place] given -pt instructed to call OB of choice tomorrow at 0800 to schedule NOB and discuss BP meds in pregnancy -return MAU precautions given -pt discharged to home in stable condition  Elmyra Ricks E Nugent 05/09/2019, 5:24 PM

## 2019-05-09 NOTE — ED Notes (Signed)
MAU called and spoke with Elmyra Ricks transport will take patient over.
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
## 2019-05-10 ENCOUNTER — Telehealth: Payer: Self-pay

## 2019-05-10 LAB — RH IG WORKUP (INCLUDES ABO/RH)
ABO/RH(D): A NEG
Antibody Screen: NEGATIVE
Gestational Age(Wks): 9
Unit division: 0

## 2019-05-10 LAB — GC/CHLAMYDIA PROBE AMP (~~LOC~~) NOT AT ARMC
Chlamydia: NEGATIVE
Neisseria Gonorrhea: NEGATIVE

## 2019-05-10 NOTE — Telephone Encounter (Signed)
The health department called to schedule the appointment due to the patient being told she is to be on blood pressure medication. She stated she the health department doesn't see patients on blood pressure meds. She also stated the patient has not been seen there with in the last year.

## 2019-05-16 ENCOUNTER — Telehealth: Payer: Self-pay | Admitting: Family Medicine

## 2019-05-16 NOTE — Telephone Encounter (Signed)
Attempted to call patient about her appointment on 6/16 @ 8:15. No answer, left voicemail instructing patient to be sure she has the [employer] before her appointment. Instructed that she is needs any assistance getting the app to give the office a call back. Screening not completed due to it being a virtual appointment.

## 2019-05-17 ENCOUNTER — Other Ambulatory Visit: Payer: Self-pay

## 2019-05-17 ENCOUNTER — Telehealth: Payer: Self-pay | Admitting: Family Medicine

## 2019-05-17 ENCOUNTER — Ambulatory Visit (INDEPENDENT_AMBULATORY_CARE_PROVIDER_SITE_OTHER): Payer: Medicaid Other | Admitting: *Deleted

## 2019-05-17 DIAGNOSIS — O0990 Supervision of high risk pregnancy, unspecified, unspecified trimester: Secondary | ICD-10-CM

## 2019-05-17 DIAGNOSIS — O26899 Other specified pregnancy related conditions, unspecified trimester: Secondary | ICD-10-CM

## 2019-05-17 DIAGNOSIS — Z6791 Unspecified blood type, Rh negative: Secondary | ICD-10-CM | POA: Insufficient documentation

## 2019-05-17 DIAGNOSIS — O10919 Unspecified pre-existing hypertension complicating pregnancy, unspecified trimester: Secondary | ICD-10-CM

## 2019-05-17 DIAGNOSIS — O34219 Maternal care for unspecified type scar from previous cesarean delivery: Secondary | ICD-10-CM | POA: Insufficient documentation

## 2019-05-17 MED ORDER — DOCUSATE SODIUM 100 MG PO CAPS: 100.0000 mg | ORAL_CAPSULE | Freq: Two times a day (BID) | ORAL | 0 refills | Status: DC

## 2019-05-17 MED ORDER — POLYETHYLENE GLYCOL 3350 17 G PO PACK: 17.0000 g | PACK | Freq: Every day | ORAL | 0 refills | Status: DC

## 2019-05-17 NOTE — Telephone Encounter (Signed)
Called patient to see if she had the MyChart app downloaded for her visit this morning. The first number I called was not her's, and the lady that answered the phone hung up on me.

## 2019-05-17 NOTE — Telephone Encounter (Signed)
Per Diane Day RN patient needs a code to get into her MyChart. Called patient, and left her a VM with the [place] number.

## 2019-05-17 NOTE — Progress Notes (Signed)
Called pt @ 0825 in order to begin scheduled New Ob intake video visit. Pt stated she had the MyChart App downloaded but was having difficulty creating an account. I advised that one of our staff members would call her back in order to assist. Pt agreed.   0900  Pt still unable to create account in MyChart App - visit to be completed by phone.  I connected with  [name] [name] on 05/17/19 at  8:15 AM EDT by telephone and verified that I am speaking with the correct person using two identifiers.   I discussed the limitations, risks, security and privacy concerns of performing an evaluation and management service by telephone and the availability of in person appointments. I also discussed with the patient that there may be a patient responsible charge related to this service. The patient expressed understanding and agreed to proceed.  New Ob intake completed. Pt advised that due to Covid-19 her prenatal care will include a combination of virtual visits as well as face to visits. She was also informed that she will not be able to have a support person accompany her to office visits. Pt agrees to check BP twice weekly per request of Dr. Rosana Hoes and enter results to [facility] app. She was advised to contact the office immediately of she has BP >160/100 in order that BP medication can be considered. Pt reports current constipation - Rx for Miralax and Colace sent to pharmacy. Pt voiced understanding of all information and instructions given.   [name] [name], Ronnell Freshwater, RN 05/17/2019  10:13 AM

## 2019-05-18 NOTE — Progress Notes (Signed)
I have reviewed the chart and agree with nursing staff's documentation of this patient's encounter.  Verita Schneiders, MD 05/18/2019 10:01 AM

## 2019-05-31 ENCOUNTER — Telehealth: Payer: Self-pay | Admitting: Advanced Practice Midwife

## 2019-05-31 NOTE — Telephone Encounter (Signed)
Called the patient to complete the pre-screen. The patient answered no to COVID19 symptoms and/or being previously diagnosed. Informed the patient of the wearing a face mask, sanitizing hands at the sanitizing station upon entering our office, and no visitors or children are allowed due to the COVID19 restrictions. The patient verbalized understanding. °

## 2019-06-01 ENCOUNTER — Ambulatory Visit (INDEPENDENT_AMBULATORY_CARE_PROVIDER_SITE_OTHER): Payer: Medicaid Other | Admitting: Obstetrics and Gynecology

## 2019-06-01 ENCOUNTER — Other Ambulatory Visit: Payer: Self-pay

## 2019-06-01 ENCOUNTER — Encounter: Payer: Self-pay | Admitting: Obstetrics and Gynecology

## 2019-06-01 VITALS — BP 117/75 | HR 100 | Wt 251.2 lb

## 2019-06-01 DIAGNOSIS — O10919 Unspecified pre-existing hypertension complicating pregnancy, unspecified trimester: Secondary | ICD-10-CM

## 2019-06-01 DIAGNOSIS — O09899 Supervision of other high risk pregnancies, unspecified trimester: Secondary | ICD-10-CM | POA: Insufficient documentation

## 2019-06-01 DIAGNOSIS — O26899 Other specified pregnancy related conditions, unspecified trimester: Secondary | ICD-10-CM

## 2019-06-01 DIAGNOSIS — O0990 Supervision of high risk pregnancy, unspecified, unspecified trimester: Secondary | ICD-10-CM

## 2019-06-01 DIAGNOSIS — O34219 Maternal care for unspecified type scar from previous cesarean delivery: Secondary | ICD-10-CM

## 2019-06-01 DIAGNOSIS — Z6791 Unspecified blood type, Rh negative: Secondary | ICD-10-CM

## 2019-06-01 DIAGNOSIS — O09299 Supervision of pregnancy with other poor reproductive or obstetric history, unspecified trimester: Secondary | ICD-10-CM | POA: Insufficient documentation

## 2019-06-01 MED ORDER — AMBULATORY NON FORMULARY MEDICATION: 1.0000 | 0 refills | Status: DC

## 2019-06-01 MED ORDER — ASPIRIN EC 81 MG PO TBEC: 81.0000 mg | DELAYED_RELEASE_TABLET | Freq: Every day | ORAL | 2 refills | Status: DC

## 2019-06-01 MED ORDER — PREPLUS 27-1 MG PO TABS: 1.0000 | ORAL_TABLET | Freq: Every day | ORAL | 13 refills | Status: DC

## 2019-06-01 MED ORDER — ONDANSETRON 4 MG PO TBDP: 4.0000 mg | ORAL_TABLET | Freq: Four times a day (QID) | ORAL | 0 refills | Status: DC | PRN

## 2019-06-01 NOTE — Progress Notes (Signed)
Pt states she did not receive the blood pressure cuff or a call from [facility].

## 2019-06-01 NOTE — Patient Instructions (Signed)
First Trimester of Pregnancy The first trimester of pregnancy is from week 1 until the end of week 13 (months 1 through 3). A week after a sperm fertilizes an egg, the egg will implant on the wall of the uterus. This embryo will begin to develop into a baby. Genes from you and your partner will form the baby. The female genes will determine whether the baby will be a boy or a girl. At 6-8 weeks, the eyes and face will be formed, and the heartbeat can be seen on ultrasound. At the end of 12 weeks, all the baby's organs will be formed. Now that you are pregnant, you will want to do everything you can to have a healthy baby. Two of the most important things are to get good prenatal care and to follow your health care provider's instructions. Prenatal care is all the medical care you receive before the baby's birth. This care will help prevent, find, and treat any problems during the pregnancy and childbirth. Body changes during your first trimester Your body goes through many changes during pregnancy. The changes vary from woman to woman.  You may gain or lose a couple of pounds at first.  You may feel sick to your stomach (nauseous) and you may throw up (vomit). If the vomiting is uncontrollable, call your health care provider.  You may tire easily.  You may develop headaches that can be relieved by medicines. All medicines should be approved by your health care provider.  You may urinate more often. Painful urination may mean you have a bladder infection.  You may develop heartburn as a result of your pregnancy.  You may develop constipation because certain hormones are causing the muscles that push stool through your intestines to slow down.  You may develop hemorrhoids or swollen veins (varicose veins).  Your breasts may begin to grow larger and become tender. Your nipples may stick out more, and the tissue that surrounds them (areola) may become darker.  Your gums may bleed and may be  sensitive to brushing and flossing.  Dark spots or blotches (chloasma, mask of pregnancy) may develop on your face. This will likely fade after the baby is born.  Your menstrual periods will stop.  You may have a loss of appetite.  You may develop cravings for certain kinds of food.  You may have changes in your emotions from day to day, such as being excited to be pregnant or being concerned that something may go wrong with the pregnancy and baby.  You may have more vivid and strange dreams.  You may have changes in your hair. These can include thickening of your hair, rapid growth, and changes in texture. Some women also have hair loss during or after pregnancy, or hair that feels dry or thin. Your hair will most likely return to normal after your baby is born. What to expect at prenatal visits During a routine prenatal visit:  You will be weighed to make sure you and the baby are growing normally.  Your blood pressure will be taken.  Your abdomen will be measured to track your baby's growth.  The fetal heartbeat will be listened to between weeks 10 and 14 of your pregnancy.  Test results from any previous visits will be discussed. Your health care provider may ask you:  How you are feeling.  If you are feeling the baby move.  If you have had any abnormal symptoms, such as leaking fluid, bleeding, severe headaches, or abdominal   cramping.  If you are using any tobacco products, including cigarettes, chewing tobacco, and electronic cigarettes.  If you have any questions. Other tests that may be performed during your first trimester include:  Blood tests to find your blood type and to check for the presence of any previous infections. The tests will also be used to check for low iron levels (anemia) and protein on red blood cells (Rh antibodies). Depending on your risk factors, or if you previously had diabetes during pregnancy, you may have tests to check for high blood sugar  that affects pregnant women (gestational diabetes).  Urine tests to check for infections, diabetes, or protein in the urine.  An ultrasound to confirm the proper growth and development of the baby.  Fetal screens for spinal cord problems (spina bifida) and Down syndrome.  HIV (human immunodeficiency virus) testing. Routine prenatal testing includes screening for HIV, unless you choose not to have this test.  You may need other tests to make sure you and the baby are doing well. Follow these instructions at home: Medicines  Follow your health care provider's instructions regarding medicine use. Specific medicines may be either safe or unsafe to take during pregnancy.  Take a prenatal vitamin that contains at least 600 micrograms (mcg) of folic acid.  If you develop constipation, try taking a stool softener if your health care provider approves. Eating and drinking   Eat a balanced diet that includes fresh fruits and vegetables, whole grains, good sources of protein such as meat, eggs, or tofu, and low-fat dairy. Your health care provider will help you determine the amount of weight gain that is right for you.  Avoid raw meat and uncooked cheese. These carry germs that can cause birth defects in the baby.  Eating four or five small meals rather than three large meals a day may help relieve nausea and vomiting. If you start to feel nauseous, eating a few soda crackers can be helpful. Drinking liquids between meals, instead of during meals, also seems to help ease nausea and vomiting.  Limit foods that are high in fat and processed sugars, such as fried and sweet foods.  To prevent constipation: ? Eat foods that are high in fiber, such as fresh fruits and vegetables, whole grains, and beans. ? Drink enough fluid to keep your urine clear or pale yellow. Activity  Exercise only as directed by your health care provider. Most women can continue their usual exercise routine during  pregnancy. Try to exercise for 30 minutes at least 5 days a week. Exercising will help you: ? Control your weight. ? Stay in shape. ? Be prepared for labor and delivery.  Experiencing pain or cramping in the lower abdomen or lower back is a good sign that you should stop exercising. Check with your health care provider before continuing with normal exercises.  Try to avoid standing for long periods of time. Move your legs often if you must stand in one place for a long time.  Avoid heavy lifting.  Wear low-heeled shoes and practice good posture.  You may continue to have sex unless your health care provider tells you not to. Relieving pain and discomfort  Wear a good support bra to relieve breast tenderness.  Take warm sitz baths to soothe any pain or discomfort caused by hemorrhoids. Use hemorrhoid cream if your health care provider approves.  Rest with your legs elevated if you have leg cramps or low back pain.  If you develop varicose veins in   your legs, wear support hose. Elevate your feet for 15 minutes, 3-4 times a day. Limit salt in your diet. Prenatal care  Schedule your prenatal visits by the twelfth week of pregnancy. They are usually scheduled monthly at first, then more often in the last 2 months before delivery.  Write down your questions. Take them to your prenatal visits.  Keep all your prenatal visits as told by your health care provider. This is important. Safety  Wear your seat belt at all times when driving.  Make a list of emergency phone numbers, including numbers for family, friends, the hospital, and police and fire departments. General instructions  Ask your health care provider for a referral to a local prenatal education class. Begin classes no later than the beginning of month 6 of your pregnancy.  Ask for help if you have counseling or nutritional needs during pregnancy. Your health care provider can offer advice or refer you to specialists for help  with various needs.  Do not use hot tubs, steam rooms, or saunas.  Do not douche or use tampons or scented sanitary pads.  Do not cross your legs for long periods of time.  Avoid cat litter boxes and soil used by cats. These carry germs that can cause birth defects in the baby and possibly loss of the fetus by miscarriage or stillbirth.  Avoid all smoking, herbs, alcohol, and medicines not prescribed by your health care provider. Chemicals in these products affect the formation and growth of the baby.  Do not use any products that contain nicotine or tobacco, such as cigarettes and e-cigarettes. If you need help quitting, ask your health care provider. You may receive counseling support and other resources to help you quit.  Schedule a dentist appointment. At home, brush your teeth with a soft toothbrush and be gentle when you floss. Contact a health care provider if:  You have dizziness.  You have mild pelvic cramps, pelvic pressure, or nagging pain in the abdominal area.  You have persistent nausea, vomiting, or diarrhea.  You have a bad smelling vaginal discharge.  You have pain when you urinate.  You notice increased swelling in your face, hands, legs, or ankles.  You are exposed to fifth disease or chickenpox.  You are exposed to German measles (rubella) and have never had it. Get help right away if:  You have a fever.  You are leaking fluid from your vagina.  You have spotting or bleeding from your vagina.  You have severe abdominal cramping or pain.  You have rapid weight gain or loss.  You vomit blood or material that looks like coffee grounds.  You develop a severe headache.  You have shortness of breath.  You have any kind of trauma, such as from a fall or a car accident. Summary  The first trimester of pregnancy is from week 1 until the end of week 13 (months 1 through 3).  Your body goes through many changes during pregnancy. The changes vary from  woman to woman.  You will have routine prenatal visits. During those visits, your health care provider will examine you, discuss any test results you may have, and talk with you about how you are feeling. This information is not intended to replace advice given to you by your health care provider. Make sure you discuss any questions you have with your health care provider. Document Released: 11/11/2001 Document Revised: 10/30/2017 Document Reviewed: 10/29/2016 Elsevier Patient Education  2020 [employer]  

## 2019-06-02 LAB — PROTEIN / CREATININE RATIO, URINE
Creatinine, Urine: 201.5 mg/dL
Protein, Ur: 26.7 mg/dL
Protein/Creat Ratio: 133 mg/g creat (ref 0–200)

## 2019-06-03 LAB — OBSTETRIC PANEL, INCLUDING HIV
Basophils Absolute: 0 10*3/uL (ref 0.0–0.2)
Basos: 0 %
EOS (ABSOLUTE): 0.1 10*3/uL (ref 0.0–0.4)
Eos: 1 %
HIV Screen 4th Generation wRfx: NONREACTIVE
Hematocrit: 34.4 % (ref 34.0–46.6)
Hemoglobin: 11.7 g/dL (ref 11.1–15.9)
Hepatitis B Surface Ag: NEGATIVE
Immature Grans (Abs): 0 10*3/uL (ref 0.0–0.1)
Immature Granulocytes: 0 %
Lymphocytes Absolute: 2.8 10*3/uL (ref 0.7–3.1)
Lymphs: 36 %
MCH: 32.1 pg (ref 26.6–33.0)
MCHC: 34 g/dL (ref 31.5–35.7)
MCV: 95 fL (ref 79–97)
Monocytes Absolute: 0.3 10*3/uL (ref 0.1–0.9)
Monocytes: 4 %
Neutrophils Absolute: 4.4 10*3/uL (ref 1.4–7.0)
Neutrophils: 59 %
Platelets: 425 10*3/uL (ref 150–450)
RBC: 3.64 x10E6/uL — ABNORMAL LOW (ref 3.77–5.28)
RDW: 12.2 % (ref 11.7–15.4)
RPR Ser Ql: NONREACTIVE
Rh Factor: NEGATIVE
Rubella Antibodies, IGG: 2.47 index (ref >0.99)
WBC: 7.7 10*3/uL (ref 3.4–10.8)

## 2019-06-03 LAB — COMPREHENSIVE METABOLIC PANEL
ALT: 32 IU/L (ref 0–32)
AST: 17 IU/L (ref 0–40)
Albumin/Globulin Ratio: 1.3 (ref 1.2–2.2)
Albumin: 4.1 g/dL (ref 3.8–4.8)
Alkaline Phosphatase: 55 IU/L (ref 39–117)
BUN/Creatinine Ratio: 15 (ref 9–23)
BUN: 11 mg/dL (ref 6–20)
Bilirubin Total: <0.2 mg/dL (ref 0.0–1.2)
CO2: 22 mmol/L (ref 20–29)
Calcium: 9.8 mg/dL (ref 8.7–10.2)
Chloride: 98 mmol/L (ref 96–106)
Creatinine, Ser: 0.72 mg/dL (ref 0.57–1.00)
GFR calc Af Amer: 129 mL/min/{1.73_m2} (ref >59)
GFR calc non Af Amer: 112 mL/min/{1.73_m2} (ref >59)
Globulin, Total: 3.1 g/dL (ref 1.5–4.5)
Glucose: 86 mg/dL (ref 65–99)
Potassium: 4.4 mmol/L (ref 3.5–5.2)
Sodium: 136 mmol/L (ref 134–144)
Total Protein: 7.2 g/dL (ref 6.0–8.5)

## 2019-06-03 LAB — AB SCR+ANTIBODY ID: Antibody Screen: POSITIVE — AB

## 2019-06-03 LAB — HEMOGLOBIN A1C
Est. average glucose Bld gHb Est-mCnc: 94 mg/dL
Hgb A1c MFr Bld: 4.9 % (ref 4.8–5.6)

## 2019-06-03 LAB — TSH: TSH: 0.645 u[IU]/mL (ref 0.450–4.500)

## 2019-06-05 LAB — CULTURE, OB URINE

## 2019-06-05 LAB — URINE CULTURE, OB REFLEX
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
## 2019-06-07 ENCOUNTER — Encounter: Payer: Self-pay | Admitting: *Deleted

## 2019-06-17 ENCOUNTER — Encounter (HOSPITAL_COMMUNITY): Payer: Self-pay | Admitting: *Deleted

## 2019-06-17 ENCOUNTER — Inpatient Hospital Stay (HOSPITAL_COMMUNITY)
Admission: EM | Admit: 2019-06-17 | Discharge: 2019-06-17 | Disposition: A | Discharge status: Home / Self Care | Payer: Medicaid Other | Attending: Obstetrics and Gynecology | Admitting: Obstetrics and Gynecology

## 2019-06-17 DIAGNOSIS — O0992 Supervision of high risk pregnancy, unspecified, second trimester: Secondary | ICD-10-CM

## 2019-06-17 DIAGNOSIS — Z3A15 15 weeks gestation of pregnancy: Secondary | ICD-10-CM | POA: Insufficient documentation

## 2019-06-17 DIAGNOSIS — R55 Syncope and collapse: Secondary | ICD-10-CM | POA: Insufficient documentation

## 2019-06-17 DIAGNOSIS — O10912 Unspecified pre-existing hypertension complicating pregnancy, second trimester: Secondary | ICD-10-CM

## 2019-06-17 DIAGNOSIS — Z8249 Family history of ischemic heart disease and other diseases of the circulatory system: Secondary | ICD-10-CM | POA: Insufficient documentation

## 2019-06-17 DIAGNOSIS — Z8261 Family history of arthritis: Secondary | ICD-10-CM | POA: Diagnosis not present

## 2019-06-17 DIAGNOSIS — Z825 Family history of asthma and other chronic lower respiratory diseases: Secondary | ICD-10-CM | POA: Insufficient documentation

## 2019-06-17 DIAGNOSIS — Z87440 Personal history of urinary (tract) infections: Secondary | ICD-10-CM | POA: Diagnosis not present

## 2019-06-17 DIAGNOSIS — O99282 Endocrine, nutritional and metabolic diseases complicating pregnancy, second trimester: Secondary | ICD-10-CM | POA: Insufficient documentation

## 2019-06-17 DIAGNOSIS — Z7982 Long term (current) use of aspirin: Secondary | ICD-10-CM | POA: Diagnosis not present

## 2019-06-17 DIAGNOSIS — O26892 Other specified pregnancy related conditions, second trimester: Secondary | ICD-10-CM | POA: Insufficient documentation

## 2019-06-17 DIAGNOSIS — E860 Dehydration: Secondary | ICD-10-CM

## 2019-06-17 DIAGNOSIS — R51 Headache: Secondary | ICD-10-CM | POA: Insufficient documentation

## 2019-06-17 DIAGNOSIS — O34219 Maternal care for unspecified type scar from previous cesarean delivery: Secondary | ICD-10-CM | POA: Diagnosis not present

## 2019-06-17 DIAGNOSIS — Z8679 Personal history of other diseases of the circulatory system: Secondary | ICD-10-CM

## 2019-06-17 DIAGNOSIS — R42 Dizziness and giddiness: Secondary | ICD-10-CM | POA: Diagnosis present

## 2019-06-17 DIAGNOSIS — O0990 Supervision of high risk pregnancy, unspecified, unspecified trimester: Secondary | ICD-10-CM

## 2019-06-17 DIAGNOSIS — O10919 Unspecified pre-existing hypertension complicating pregnancy, unspecified trimester: Secondary | ICD-10-CM

## 2019-06-17 LAB — BASIC METABOLIC PANEL
Anion gap: 9 (ref 5–15)
BUN: 8 mg/dL (ref 6–20)
CO2: 24 mmol/L (ref 22–32)
Calcium: 9.2 mg/dL (ref 8.9–10.3)
Chloride: 104 mmol/L (ref 98–111)
Creatinine, Ser: 0.53 mg/dL (ref 0.44–1.00)
GFR calc Af Amer: >60 mL/min (ref >60)
GFR calc non Af Amer: >60 mL/min (ref >60)
Glucose, Bld: 86 mg/dL (ref 70–99)
Potassium: 4.1 mmol/L (ref 3.5–5.1)
Sodium: 137 mmol/L (ref 135–145)

## 2019-06-17 LAB — URINALYSIS, ROUTINE W REFLEX MICROSCOPIC
Bilirubin Urine: NEGATIVE
Glucose, UA: NEGATIVE mg/dL
Hgb urine dipstick: NEGATIVE
Ketones, ur: NEGATIVE mg/dL
Nitrite: NEGATIVE
Protein, ur: 30 mg/dL — AB
Specific Gravity, Urine: 1.02 (ref 1.005–1.030)
pH: 5 (ref 5.0–8.0)

## 2019-06-17 LAB — CBC
HCT: 35 % — ABNORMAL LOW (ref 36.0–46.0)
Hemoglobin: 11.3 g/dL — ABNORMAL LOW (ref 12.0–15.0)
MCH: 31.9 pg (ref 26.0–34.0)
MCHC: 32.3 g/dL (ref 30.0–36.0)
MCV: 98.9 fL (ref 80.0–100.0)
Platelets: 279 10*3/uL (ref 150–400)
RBC: 3.54 MIL/uL — ABNORMAL LOW (ref 3.87–5.11)
RDW: 13 % (ref 11.5–15.5)
WBC: 7.4 10*3/uL (ref 4.0–10.5)
nRBC: 0 % (ref 0.0–0.2)

## 2019-06-17 MED ORDER — DEXAMETHASONE SODIUM PHOSPHATE 10 MG/ML IJ SOLN
10.0000 mg | Freq: Once | INTRAMUSCULAR | Status: AC
  Administered 2019-06-17: 10 mg via INTRAVENOUS
  Filled 2019-06-17: qty 1

## 2019-06-17 MED ORDER — DIPHENHYDRAMINE HCL 50 MG/ML IJ SOLN
25.0000 mg | Freq: Once | INTRAMUSCULAR | Status: AC
  Administered 2019-06-17: 25 mg via INTRAVENOUS
  Filled 2019-06-17: qty 1

## 2019-06-17 MED ORDER — METOCLOPRAMIDE HCL 5 MG/ML IJ SOLN
10.0000 mg | Freq: Once | INTRAMUSCULAR | Status: AC
  Administered 2019-06-17: 10 mg via INTRAVENOUS
  Filled 2019-06-17: qty 2

## 2019-06-17 MED ORDER — LACTATED RINGERS IV BOLUS
1000.0000 mL | Freq: Once | INTRAVENOUS | Status: AC
  Administered 2019-06-17: 1000 mL via INTRAVENOUS

## 2019-06-17 NOTE — MAU Provider Note (Signed)
History     CSN: [id number]  Arrival date and time: 06/17/19 1319  First Provider Initiated Contact with Patient 06/17/19 1534     Chief Complaint  Patient presents with  . Dizziness  . Abdominal Pain   [id number] @15 .2 wks presenting after syncope at work around noon. Reports feeling well prior to incident just had felt nausea. She thinks she fell, only remembers a co-worker helping her up. Admits to not eating anything today. Had 1/2 bottle of water. Reports pain in hips and low back since fall. Has occasional sharp pain in abdomen. Denies VB or cramping. Reports frontal HA. Rates 7/10. No visual disturbances.   OB History    Gravida  7   Para  6   Term  5   Preterm  1   AB  0   Living  7     SAB  0   TAB  0   Ectopic  0   Multiple  1   Live Births  7           Past Medical History:  Diagnosis Date  . Abnormal Pap smear    f/u was normal  . Chronic hypertension   . DEPRESSION, MAJOR, RECURRENT, MODERATE 04/18/2010   Qualifier: Diagnosis of  By: Georgina Snell MD, Ellard Artis    . Headache(784.0)   . History of abnormal Pap smear 07/28/2012  . History of preterm delivery 11/04/2018  . Infection    trich, chlamydia, gonorrhea  . Ovarian cyst   . Urinary tract infection     Past Surgical History:  Procedure Laterality Date  . CESAREAN SECTION    . CESAREAN SECTION N/A 09/23/2018   Procedure: CESAREAN SECTION;  Surgeon: Mora Bellman, MD;  Location: [facility];  Service: Obstetrics;  Laterality: N/A;  . TONSILLECTOMY    . WISDOM TOOTH EXTRACTION      Family History  Problem Relation Age of Onset  . Hypertension Mother   . Arthritis Mother   . Hypertension Father   . Asthma Brother   . Cancer Maternal Uncle        lung  . Diabetes Maternal Grandmother   . Cancer Maternal Grandmother        thyroid  . Hypertension Maternal Grandmother   . Other Neg Hx     Social History   Tobacco Use  . Smoking status: Never Smoker  . Smokeless tobacco: Never  Used  Substance Use Topics  . Alcohol use: Not Currently    Alcohol/week: 2.0 standard drinks    Types: 1 Glasses of wine, 1 Shots of liquor per week    Comment: Not currently. Last use May 2020  . Drug use: Not Currently    Types: Marijuana    Comment: Not currently. Last use 2017    Allergies: No Known Allergies  Medications Prior to Admission  Medication Sig Dispense Refill Last Dose  . AMBULATORY NON FORMULARY MEDICATION 1 Device by Other route once a week. Blood Pressure Cuff/ Medium Monitored Regularly at home ICD 10: O09.90 (Patient not taking: Reported on 06/01/2019) 1 kit 0   . aspirin EC 81 MG tablet Take 1 tablet (81 mg total) by mouth daily. Take after 12 weeks for prevention of preeclampsia later in pregnancy 300 tablet 2   . Butalbital-APAP-Caffeine 50-325-40 MG capsule Take 1 capsule by mouth every 6 (six) hours as needed for headache. (Patient not taking: Reported on 10/20/2018) 30 capsule 0   . docusate sodium (COLACE) 100 MG capsule Take  1 capsule (100 mg total) by mouth 2 (two) times daily. 10 capsule 0   . ondansetron (ZOFRAN ODT) 4 MG disintegrating tablet Take 1 tablet (4 mg total) by mouth every 6 (six) hours as needed for nausea. 20 tablet 0   . oxyCODONE (OXY IR/ROXICODONE) 5 MG immediate release tablet Take 1 tablet (5 mg total) by mouth every 4 (four) hours as needed (pain scale 4-7). (Patient not taking: Reported on 05/17/2019) 30 tablet 0   . polyethylene glycol (MIRALAX) 17 g packet Take 17 g by mouth daily. 14 each 0   . Prenatal MV & Min w/FA-DHA (PRENATAL ADULT GUMMY/DHA/FA PO) Take 2 tablets by mouth daily.     . Prenatal Vit-Fe Fumarate-FA (PREPLUS) 27-1 MG TABS Take 1 tablet by mouth daily. 30 tablet 13   . valACYclovir (VALTREX) 1000 MG tablet Take 1 tablet (1,000 mg total) by mouth daily. (Patient not taking: Reported on 10/20/2018) 30 tablet 1     Review of Systems  Constitutional: Negative for chills and fever.  Respiratory: Negative for shortness  of breath.   Cardiovascular: Negative for chest pain.  Gastrointestinal: Positive for abdominal pain and nausea. Negative for vomiting.  Genitourinary: Negative for vaginal bleeding and vaginal discharge.  Musculoskeletal: Positive for back pain.  Neurological: Positive for syncope and headaches.   Physical Exam   Blood pressure 115/76, pulse 92, temperature 98.2 F (36.8 C), resp. rate 18, last menstrual period 03/02/2019, SpO2 100 %, unknown if currently breastfeeding.  Physical Exam  Nursing note and vitals reviewed. Constitutional: She is oriented to person, place, and time. She appears well-developed and well-nourished. No distress.  HENT:  Head: Normocephalic and atraumatic.  Neck: Normal range of motion.  Cardiovascular: Normal rate, regular rhythm and normal heart sounds.  Respiratory: Effort normal and breath sounds normal. No respiratory distress. She has no wheezes. She has no rales.  GI: Soft. She exhibits no distension. There is no abdominal tenderness.  Musculoskeletal: Normal range of motion.  Neurological: She is alert and oriented to person, place, and time. No cranial nerve deficit.  Skin: Skin is warm and dry.  Psychiatric: She has a normal mood and affect.  FHT: 166  Results for orders placed or performed during the hospital encounter of 06/17/19 (from the past 24 hour(s))  Urinalysis, Routine w reflex microscopic     Status: Abnormal   Collection Time: 06/17/19  2:31 PM  Result Value Ref Range   Color, Urine YELLOW YELLOW   APPearance HAZY (A) CLEAR   Specific Gravity, Urine 1.020 1.005 - 1.030   pH 5.0 5.0 - 8.0   Glucose, UA NEGATIVE NEGATIVE mg/dL   Hgb urine dipstick NEGATIVE NEGATIVE   Bilirubin Urine NEGATIVE NEGATIVE   Ketones, ur NEGATIVE NEGATIVE mg/dL   Protein, ur 30 (A) NEGATIVE mg/dL   Nitrite NEGATIVE NEGATIVE   Leukocytes,Ua TRACE (A) NEGATIVE   RBC / HPF 0-5 0 - 5 RBC/hpf   WBC, UA 11-20 0 - 5 WBC/hpf   Bacteria, UA RARE (A) NONE SEEN    Squamous Epithelial / LPF 0-5 0 - 5   Mucus PRESENT   CBC     Status: Abnormal   Collection Time: 06/17/19  3:24 PM  Result Value Ref Range   WBC 7.4 4.0 - 10.5 K/uL   RBC 3.54 (L) 3.87 - 5.11 MIL/uL   Hemoglobin 11.3 (L) 12.0 - 15.0 g/dL   HCT 35.0 (L) 36.0 - 46.0 %   MCV 98.9 80.0 - 100.0 fL  MCH 31.9 26.0 - 34.0 pg   MCHC 32.3 30.0 - 36.0 g/dL   RDW 13.0 11.5 - 15.5 %   Platelets 279 150 - 400 K/uL   nRBC 0.0 0.0 - 0.2 %  Basic metabolic panel     Status: None   Collection Time: 06/17/19  3:24 PM  Result Value Ref Range   Sodium 137 135 - 145 mmol/L   Potassium 4.1 3.5 - 5.1 mmol/L   Chloride 104 98 - 111 mmol/L   CO2 24 22 - 32 mmol/L   Glucose, Bld 86 70 - 99 mg/dL   BUN 8 6 - 20 mg/dL   Creatinine, Ser 0.53 0.44 - 1.00 mg/dL   Calcium 9.2 8.9 - 10.3 mg/dL   GFR calc non Af Amer >60 >60 mL/min   GFR calc Af Amer >60 >60 mL/min   Anion gap 9 5 - 15   MAU Course  Procedures Orders Placed This Encounter  Procedures  . Culture, OB Urine    Standing Status:   Standing    Number of Occurrences:   1  . Urinalysis, Routine w reflex microscopic    Standing Status:   Standing    Number of Occurrences:   1  . CBC    Standing Status:   Standing    Number of Occurrences:   1  . Basic metabolic panel    Standing Status:   Standing    Number of Occurrences:   1  . Diet regular Room service appropriate? Yes; Fluid consistency: Thin    Standing Status:   Standing    Number of Occurrences:   1    Order Specific Question:   Room service appropriate?    Answer:   Yes    Order Specific Question:   Fluid consistency:    Answer:   Thin  . Orthostatic vital signs    Standing Status:   Standing    Number of Occurrences:   1  . EKG 12-Lead    Standing Status:   Standing    Number of Occurrences:   1    Order Specific Question:   Reason for Exam    Answer:   syncope   Meds ordered this encounter  Medications  . lactated ringers bolus 1,000 mL  . metoCLOPramide (REGLAN)  injection 10 mg  . dexamethasone (DECADRON) injection 10 mg  . diphenhydrAMINE (BENADRYL) injection 25 mg   MDM Labs ordered and reviewed. EKG normal. Normal neuro exam. HA resolved with meds. Sx likely d/t poor hydration and poor food intake. Discussed importance of eating often and good hydration. Stable for discharge home.  Assessment and Plan  [redacted] weeks gestation Syncope Dehydration Headache in pregnancy Discharge home Follow up at [facility] as scheduled Return precautions  Allergies as of 06/17/2019   No Known Allergies     Medication List    STOP taking these medications   oxyCODONE 5 MG immediate release tablet Commonly known as: Oxy IR/ROXICODONE   PRENATAL ADULT GUMMY/DHA/FA PO     TAKE these medications   AMBULATORY NON FORMULARY MEDICATION 1 Device by Other route once a week. Blood Pressure Cuff/ Medium Monitored Regularly at home ICD 10: O09.90   aspirin EC 81 MG tablet Take 1 tablet (81 mg total) by mouth daily. Take after 12 weeks for prevention of preeclampsia later in pregnancy   Butalbital-APAP-Caffeine 50-325-40 MG capsule Take 1 capsule by mouth every 6 (six) hours as needed for headache.   docusate sodium 100  MG capsule Commonly known as: Colace Take 1 capsule (100 mg total) by mouth 2 (two) times daily.   ondansetron 4 MG disintegrating tablet Commonly known as: Zofran ODT Take 1 tablet (4 mg total) by mouth every 6 (six) hours as needed for nausea.   polyethylene glycol 17 g packet Commonly known as: MiraLax Take 17 g by mouth daily.   PrePLUS 27-1 MG Tabs Take 1 tablet by mouth daily.   valACYclovir 1000 MG tablet Commonly known as: VALTREX Take 1 tablet (1,000 mg total) by mouth daily.       Julianne Handler, CNM 06/17/2019, 7:38 PM

## 2019-06-17 NOTE — Discharge Instructions (Signed)
Syncope Syncope is when you pass out (faint) for a short time. It is caused by a sudden decrease in blood flow to the brain. Signs that you may be about to pass out include:  Feeling dizzy or light-headed.  Feeling sick to your stomach (nauseous).  Seeing all white or all black.  Having cold, clammy skin. If you pass out, get help right away. Call your local emergency services (911 in the [place] Do not drive yourself to the hospital. Follow these instructions at home: Watch for any changes in your symptoms. Take these actions to stay safe and help with your symptoms: Lifestyle  Do not drive, use machinery, or play sports until your doctor says it is okay.  Do not drink alcohol.  Do not use any products that contain nicotine or tobacco, such as cigarettes and e-cigarettes. If you need help quitting, ask your doctor.  Drink enough fluid to keep your pee (urine) pale yellow. General instructions  Take over-the-counter and prescription medicines only as told by your doctor.  If you are taking blood pressure or heart medicine, sit up and stand up slowly. Spend a few minutes getting ready to sit and then stand. This can help you feel less dizzy.  Have someone stay with you until you feel stable.  If you start to feel like you might pass out, lie down right away and raise (elevate) your feet above the level of your heart. Breathe deeply and steadily. Wait until all of the symptoms are gone.  Keep all follow-up visits as told by your doctor. This is important. Get help right away if:  You have a very bad headache.  You pass out once or more than once.  You have pain in your chest, belly, or back.  You have a very fast or uneven heartbeat (palpitations).  It hurts to breathe.  You are bleeding from your mouth or your bottom (rectum).  You have black or tarry poop (stool).  You have jerky movements that you cannot control (seizure).  You are confused.  You have trouble  walking.  You are very weak.  You have vision problems. These symptoms may be an emergency. Do not wait to see if the symptoms will go away. Get medical help right away. Call your local emergency services (911 in the [place] Do not drive yourself to the hospital. Summary  Syncope is when you pass out (faint) for a short time. It is caused by a sudden decrease in blood flow to the brain.  Signs that you may be about to faint include feeling dizzy, light-headed, or sick to your stomach, seeing all white or all black, or having cold, clammy skin.  If you start to feel like you might pass out, lie down right away and raise (elevate) your feet above the level of your heart. Breathe deeply and steadily. Wait until all of the symptoms are gone. This information is not intended to replace advice given to you by your health care provider. Make sure you discuss any questions you have with your health care provider. Document Released: 05/05/2008 Document Revised: 12/30/2017 Document Reviewed: 12/30/2017 Elsevier Patient Education  2020 [place] for Pregnant Women While you are pregnant, your body requires additional nutrition to help support your growing baby. You also have a higher need for some vitamins and minerals, such as folic acid, calcium, iron, and vitamin D. Eating a healthy, well-balanced diet is very important for your health and your baby's health.  Your need for extra calories varies for the three 2-month segments of your pregnancy (trimesters). For most women, it is recommended to consume:  150 extra calories a day during the first trimester.  300 extra calories a day during the second trimester.  300 extra calories a day during the third trimester. What are tips for following this plan?   Do not try to lose weight or go on a diet during pregnancy.  Limit your overall intake of foods that have "empty calories." These are foods that have little nutritional value,  such as sweets, desserts, candies, and sugar-sweetened beverages.  Eat a variety of foods (especially fruits and vegetables) to get a full range of vitamins and minerals.  Take a prenatal vitamin to help meet your additional vitamin and mineral needs during pregnancy, specifically for folic acid, iron, calcium, and vitamin D.  Remember to stay active. Ask your health care provider what types of exercise and activities are safe for you.  Practice good food safety and cleanliness. Wash your hands before you eat and after you prepare raw meat. Wash all fruits and vegetables well before peeling or eating. Taking these actions can help to prevent food-borne illnesses that can be very dangerous to your baby, such as listeriosis. Ask your health care provider for more information about listeriosis. What does 150 extra calories look like? Healthy options that provide 150 extra calories each day could be any of the following:  6-8 oz (170-230 g) of plain low-fat yogurt with  cup of berries.  1 apple with 2 teaspoons (11 g) of peanut butter.  Cut-up vegetables with  cup (60 g) of hummus.  8 oz (230 mL) or 1 cup of low-fat chocolate milk.  1 stick of string cheese with 1 medium orange.  1 peanut butter and jelly sandwich that is made with one slice of whole-wheat bread and 1 tsp (5 g) of peanut butter. For 300 extra calories, you could eat two of those healthy options each day. What is a healthy amount of weight to gain? The right amount of weight gain for you is based on your BMI before you became pregnant. If your BMI:  Was less than 18 (underweight), you should gain 28-40 lb (13-18 kg).  Was 18-24.9 (normal), you should gain 25-35 lb (11-16 kg).  Was 25-29.9 (overweight), you should gain 15-25 lb (7-11 kg).  Was 30 or greater (obese), you should gain 11-20 lb (5-9 kg). What if I am having twins or multiples? Generally, if you are carrying twins or multiples:  You may need to eat  300-600 extra calories a day.  The recommended range for total weight gain is 25-54 lb (11-25 kg), depending on your BMI before pregnancy.  Talk with your health care provider to find out about nutritional needs, weight gain, and exercise that is right for you. What foods can I eat?  Grains All grains. Choose whole grains, such as whole-wheat bread, oatmeal, or brown rice. Vegetables All vegetables. Eat a variety of colors and types of vegetables. Remember to wash your vegetables well before peeling or eating. Fruits All fruits. Eat a variety of colors and types of fruit. Remember to wash your fruits well before peeling or eating. Meats and other protein foods Lean meats, including chicken, [place], fish, and lean cuts of beef, veal, or pork. If you eat fish or seafood, choose options that are higher in omega-3 fatty acids and lower in mercury, such as salmon, herring, mussels, trout, sardines, pollock, shrimp,  crab, and lobster. Tofu. Tempeh. Beans. Eggs. Peanut butter and other nut butters. Make sure that all meats, poultry, and eggs are cooked to food-safe temperatures or "well-done." Two or more servings of fish are recommended each week in order to get the most benefits from omega-3 fatty acids that are found in seafood. Choose fish that are lower in mercury. You can find more information online:  [employer]Gaming.ch Dairy Pasteurized milk and milk alternatives (such as almond milk). Pasteurized yogurt and pasteurized cheese. Cottage cheese. Sour cream. Beverages Water. Juices that contain 100% fruit juice or vegetable juice. Caffeine-free teas and decaffeinated coffee. Drinks that contain caffeine are okay to drink, but it is better to avoid caffeine. Keep your total caffeine intake to less than 200 mg each day (which is 12 oz or 355 mL of coffee, tea, or soda) or the limit as told by your health care provider. Fats and oils Fats and oils are okay to include in moderation. Sweets and  desserts Sweets and desserts are okay to include in moderation. Seasoning and other foods All pasteurized condiments. The items listed above may not be a complete list of recommended foods and beverages. Contact your dietitian for more options. The items listed above may not be a complete list of foods and beverages [you/your child] can eat. Contact a dietitian for more information. What foods are not recommended? Vegetables Raw (unpasteurized) vegetable juices. Fruits Unpasteurized fruit juices. Meats and other protein foods Lunch meats, bologna, hot dogs, or other deli meats. (If you must eat those meats, reheat them until they are steaming hot.) Refrigerated pat, meat spreads from a meat counter, smoked seafood that is found in the refrigerated section of a store. Raw or undercooked meats, poultry, and eggs. Raw fish, such as sushi or sashimi. Fish that have high mercury content, such as tilefish, shark, swordfish, and king mackerel. To learn more about mercury in fish, talk with your health care provider or look for online resources, such as:  [employer]aming.ch Dairy Raw (unpasteurized) milk and any foods that have raw milk in them. Soft cheeses, such as feta, queso blanco, queso fresco, Brie, Camembert cheeses, blue-veined cheeses, and Panela cheese (unless it is made with pasteurized milk, which must be stated on the label). Beverages Alcohol. Sugar-sweetened beverages, such as sodas, teas, or energy drinks. Seasoning and other foods Homemade fermented foods and drinks, such as pickles, sauerkraut, or kombucha drinks. (Store-bought pasteurized versions of these are okay.) Salads that are made in a store or deli, such as ham salad, chicken salad, egg salad, tuna salad, and seafood salad. The items listed above may not be a complete list of foods and beverages to avoid. Contact your dietitian for more information. The items listed above may not be a complete list of foods and beverages  [you/your child] should avoid. Contact a dietitian for more information. Where to find more information To calculate the number of calories you need based on your height, weight, and activity level, you can use an online calculator such as:  MobileTransition.ch To calculate how much weight you should gain during pregnancy, you can use an online pregnancy weight gain calculator such as:  StreamingFood.com.cy Summary  While you are pregnant, your body requires additional nutrition to help support your growing baby.  Eat a variety of foods, especially fruits and vegetables to get a full range of vitamins and minerals.  Practice good food safety and cleanliness. Wash your hands before you eat and after you prepare raw meat. Wash all fruits  and vegetables well before peeling or eating. Taking these actions can help to prevent food-borne illnesses, such as listeriosis, that can be very dangerous to your baby.  Do not eat raw meat or fish. Do not eat fish that have high mercury content, such as tilefish, shark, swordfish, and king mackerel. Do not eat unpasteurized (raw) dairy.  Take a prenatal vitamin to help meet your additional vitamin and mineral needs during pregnancy, specifically for folic acid, iron, calcium, and vitamin D. This information is not intended to replace advice given to you by your health care provider. Make sure you discuss any questions you have with your health care provider. Document Released: 09/01/2014 Document Revised: 03/10/2019 Document Reviewed: 08/14/2017 Elsevier Patient Education  2020 [employer].

## 2019-06-17 NOTE — MAU Note (Signed)
.   [name] [name] is a 33 y.o. at [redacted]w[redacted]d here in MAU reporting: dizziness and lower abdominal cramping. PT states she passed out at work today and when she woke up her coworkers were standing around her. Doesn't remember any of it   Pain score: 7 Vitals:   06/17/19 1418  BP: 115/76  Pulse: 92  Resp: 18  Temp: 98.2 F (36.8 C)  SpO2: 100%     FHT Lab orders placed from triage: UA

## 2019-06-19 LAB — CULTURE, OB URINE: Culture: <10000 — AB

## 2019-06-29 ENCOUNTER — Telehealth: Payer: Medicaid Other | Admitting: Obstetrics & Gynecology

## 2019-06-29 ENCOUNTER — Other Ambulatory Visit: Payer: Self-pay

## 2019-06-29 ENCOUNTER — Encounter: Payer: Self-pay | Admitting: *Deleted

## 2019-06-29 ENCOUNTER — Telehealth (INDEPENDENT_AMBULATORY_CARE_PROVIDER_SITE_OTHER): Payer: Medicaid Other | Admitting: Obstetrics & Gynecology

## 2019-06-29 DIAGNOSIS — O09292 Supervision of pregnancy with other poor reproductive or obstetric history, second trimester: Secondary | ICD-10-CM

## 2019-06-29 DIAGNOSIS — O26892 Other specified pregnancy related conditions, second trimester: Secondary | ICD-10-CM

## 2019-06-29 DIAGNOSIS — Z6841 Body Mass Index (BMI) 40.0 and over, adult: Secondary | ICD-10-CM

## 2019-06-29 DIAGNOSIS — O09899 Supervision of other high risk pregnancies, unspecified trimester: Secondary | ICD-10-CM

## 2019-06-29 DIAGNOSIS — O0990 Supervision of high risk pregnancy, unspecified, unspecified trimester: Secondary | ICD-10-CM

## 2019-06-29 DIAGNOSIS — O34219 Maternal care for unspecified type scar from previous cesarean delivery: Secondary | ICD-10-CM

## 2019-06-29 DIAGNOSIS — Z3A17 17 weeks gestation of pregnancy: Secondary | ICD-10-CM

## 2019-06-29 DIAGNOSIS — O09892 Supervision of other high risk pregnancies, second trimester: Secondary | ICD-10-CM | POA: Diagnosis not present

## 2019-06-29 DIAGNOSIS — O10912 Unspecified pre-existing hypertension complicating pregnancy, second trimester: Secondary | ICD-10-CM | POA: Diagnosis not present

## 2019-06-29 DIAGNOSIS — Z6791 Unspecified blood type, Rh negative: Secondary | ICD-10-CM

## 2019-06-29 DIAGNOSIS — O10919 Unspecified pre-existing hypertension complicating pregnancy, unspecified trimester: Secondary | ICD-10-CM

## 2019-06-29 DIAGNOSIS — O0992 Supervision of high risk pregnancy, unspecified, second trimester: Secondary | ICD-10-CM | POA: Diagnosis not present

## 2019-06-29 DIAGNOSIS — Z8679 Personal history of other diseases of the circulatory system: Secondary | ICD-10-CM

## 2019-06-29 DIAGNOSIS — F70 Mild intellectual disabilities: Secondary | ICD-10-CM

## 2019-06-29 DIAGNOSIS — O26899 Other specified pregnancy related conditions, unspecified trimester: Secondary | ICD-10-CM
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
## 2019-06-29 NOTE — Progress Notes (Signed)
C/o bleeding last 3- 4 days - states it was red and only noted when she wipes, then it changed to brown yesterday. C/o fainting once at work

## 2019-06-29 NOTE — Progress Notes (Signed)
I connected with  [name] [name] on 06/29/19 at  9:55 AM EDT by telephone and verified that I am speaking with the correct person using two identifiers.   I discussed the limitations, risks, security and privacy concerns of performing an evaluation and management service by telephone and virtually and the availability of in person appointments. I also discussed with the patient that there may be a patient responsible charge related to this service. The patient expressed understanding and agreed to proceed.   Linda,RN 06/29/2019  9:49 AM

## 2019-07-01 MED ORDER — BLOOD PRESSURE KIT DEVI: 1.0000 | 0 refills | Status: DC | PRN

## 2019-07-01 NOTE — Addendum Note (Signed)
Addended by: Samuel Germany on: 07/01/2019 09:41 AM   Modules accepted: Orders

## 2019-07-08 ENCOUNTER — Emergency Department (HOSPITAL_COMMUNITY)
Admission: AD | Admit: 2019-07-08 | Discharge: 2019-07-08 | Disposition: A | Discharge status: Home / Self Care | Payer: Medicaid Other | Attending: Emergency Medicine | Admitting: Emergency Medicine

## 2019-07-08 ENCOUNTER — Encounter (HOSPITAL_COMMUNITY): Payer: Self-pay

## 2019-07-08 DIAGNOSIS — R10816 Epigastric abdominal tenderness: Secondary | ICD-10-CM | POA: Insufficient documentation

## 2019-07-08 DIAGNOSIS — Z7982 Long term (current) use of aspirin: Secondary | ICD-10-CM | POA: Insufficient documentation

## 2019-07-08 DIAGNOSIS — Z8679 Personal history of other diseases of the circulatory system: Secondary | ICD-10-CM

## 2019-07-08 DIAGNOSIS — O26822 Pregnancy related peripheral neuritis, second trimester: Secondary | ICD-10-CM | POA: Insufficient documentation

## 2019-07-08 DIAGNOSIS — R51 Headache: Secondary | ICD-10-CM | POA: Diagnosis not present

## 2019-07-08 DIAGNOSIS — O34219 Maternal care for unspecified type scar from previous cesarean delivery: Secondary | ICD-10-CM | POA: Diagnosis not present

## 2019-07-08 DIAGNOSIS — O10912 Unspecified pre-existing hypertension complicating pregnancy, second trimester: Secondary | ICD-10-CM | POA: Diagnosis not present

## 2019-07-08 DIAGNOSIS — O360920 Maternal care for other rhesus isoimmunization, second trimester, not applicable or unspecified: Secondary | ICD-10-CM | POA: Diagnosis not present

## 2019-07-08 DIAGNOSIS — R0789 Other chest pain: Secondary | ICD-10-CM | POA: Insufficient documentation

## 2019-07-08 DIAGNOSIS — Z3A18 18 weeks gestation of pregnancy: Secondary | ICD-10-CM | POA: Diagnosis not present

## 2019-07-08 DIAGNOSIS — Z79899 Other long term (current) drug therapy: Secondary | ICD-10-CM | POA: Diagnosis not present

## 2019-07-08 DIAGNOSIS — K219 Gastro-esophageal reflux disease without esophagitis: Secondary | ICD-10-CM | POA: Diagnosis not present

## 2019-07-08 DIAGNOSIS — R42 Dizziness and giddiness: Secondary | ICD-10-CM | POA: Diagnosis not present

## 2019-07-08 DIAGNOSIS — R519 Headache, unspecified: Secondary | ICD-10-CM

## 2019-07-08 DIAGNOSIS — O10919 Unspecified pre-existing hypertension complicating pregnancy, unspecified trimester: Secondary | ICD-10-CM

## 2019-07-08 DIAGNOSIS — O0992 Supervision of high risk pregnancy, unspecified, second trimester: Secondary | ICD-10-CM

## 2019-07-08 DIAGNOSIS — Z6791 Unspecified blood type, Rh negative: Secondary | ICD-10-CM

## 2019-07-08 DIAGNOSIS — O0990 Supervision of high risk pregnancy, unspecified, unspecified trimester: Secondary | ICD-10-CM

## 2019-07-08 DIAGNOSIS — R079 Chest pain, unspecified: Secondary | ICD-10-CM | POA: Diagnosis present

## 2019-07-08 LAB — CBC WITH DIFFERENTIAL/PLATELET
Abs Immature Granulocytes: 0.05 10*3/uL (ref 0.00–0.07)
Basophils Absolute: 0 10*3/uL (ref 0.0–0.1)
Basophils Relative: 0 %
Eosinophils Absolute: 0.1 10*3/uL (ref 0.0–0.5)
Eosinophils Relative: 1 %
HCT: 32.8 % — ABNORMAL LOW (ref 36.0–46.0)
Hemoglobin: 10.7 g/dL — ABNORMAL LOW (ref 12.0–15.0)
Immature Granulocytes: 1 %
Lymphocytes Relative: 22 %
Lymphs Abs: 1.9 10*3/uL (ref 0.7–4.0)
MCH: 31.5 pg (ref 26.0–34.0)
MCHC: 32.6 g/dL (ref 30.0–36.0)
MCV: 96.5 fL (ref 80.0–100.0)
Monocytes Absolute: 0.5 10*3/uL (ref 0.1–1.0)
Monocytes Relative: 6 %
Neutro Abs: 5.9 10*3/uL (ref 1.7–7.7)
Neutrophils Relative %: 70 %
Platelets: 323 10*3/uL (ref 150–400)
RBC: 3.4 MIL/uL — ABNORMAL LOW (ref 3.87–5.11)
RDW: 12.8 % (ref 11.5–15.5)
WBC: 8.3 10*3/uL (ref 4.0–10.5)
nRBC: 0 % (ref 0.0–0.2)

## 2019-07-08 LAB — URINALYSIS, ROUTINE W REFLEX MICROSCOPIC
Bilirubin Urine: NEGATIVE
Glucose, UA: NEGATIVE mg/dL
Hgb urine dipstick: NEGATIVE
Ketones, ur: 5 mg/dL — AB
Nitrite: NEGATIVE
Protein, ur: 30 mg/dL — AB
Specific Gravity, Urine: 1.016 (ref 1.005–1.030)
pH: 6 (ref 5.0–8.0)

## 2019-07-08 MED ORDER — FAMOTIDINE 20 MG PO TABS
40.0000 mg | ORAL_TABLET | Freq: Once | ORAL | Status: AC
  Administered 2019-07-08: 40 mg via ORAL
  Filled 2019-07-08: qty 2

## 2019-07-08 NOTE — MAU Provider Note (Addendum)
History     CSN: [id number]  Arrival date and time: 07/08/19 1338   First Provider Initiated Contact with Patient 07/08/19 1440      Chief Complaint  Patient presents with  . Dizziness  . Back Pain  . Abdominal Pain  . Vaginal Bleeding   HPI   [name] [name] is a 33 y.o. female [phone] @ 64w2dhere with dizziness and abdominal pain. 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 She attests to eating crackers only at breakfast around 11:00. She did not pass out, she did not hit her head. She became very dizzy and at the same time had some pain in the center of her chest. Says this pain in her chest has been present off and on for weeks. She states she has had CP with every pregnancy. She states she doesn't know of family cardiac problems, "because we don't talk about medical stuff." Has had some off and on upper abdominal pain and lower back pain that has been present for a while. She also complains of a H/A, but has not taken anything for the H/A.  OB History    Gravida  7   Para  6   Term  5   Preterm  1   AB  0   Living  7     SAB  0   TAB  0   Ectopic  0   Multiple  1   Live Births  7           Past Medical History:  Diagnosis Date  . Abnormal Pap smear    f/u was normal  . Chronic hypertension   . DEPRESSION, MAJOR, RECURRENT, MODERATE 04/18/2010   Qualifier: Diagnosis of  By: CGeorgina SnellMD, EEllard Artis   . Headache(784.0)   . History of abnormal Pap smear 07/28/2012  . History of preterm delivery 11/04/2018  . Infection    trich, chlamydia, gonorrhea  . Ovarian cyst   . Urinary tract infection     Past Surgical History:  Procedure Laterality Date  . CESAREAN SECTION    . CESAREAN SECTION N/A 09/23/2018   Procedure: CESAREAN SECTION;  Surgeon: CMora Bellman MD;  Location: [facility]  Service: Obstetrics;  Laterality: N/A;  . TONSILLECTOMY    . WISDOM TOOTH EXTRACTION      Family History  Problem Relation Age of  Onset  . Hypertension Mother   . Arthritis Mother   . Hypertension Father   . Asthma Brother   . Cancer Maternal Uncle        lung  . Diabetes Maternal Grandmother   . Cancer Maternal Grandmother        thyroid  . Hypertension Maternal Grandmother   . Other Neg Hx     Social History   Tobacco Use  . Smoking status: Never Smoker  . Smokeless tobacco: Never Used  Substance Use Topics  . Alcohol use: Not Currently    Alcohol/week: 2.0 standard drinks    Types: 1 Glasses of wine, 1 Shots of liquor per week    Comment: Not currently. Last use May 2020  . Drug use: Not Currently    Types: Marijuana    Comment: Not currently. Last use 2017    Allergies: No Known Allergies  Medications Prior to Admission  Medication Sig Dispense Refill Last Dose  . AMBULATORY NON FORMULARY MEDICATION 1 Device by Other route once a week. Blood Pressure Cuff/ Medium Monitored Regularly  at home ICD 10: O09.90 (Patient not taking: Reported on 06/01/2019) 1 kit 0   . aspirin EC 81 MG tablet Take 1 tablet (81 mg total) by mouth daily. Take after 12 weeks for prevention of preeclampsia later in pregnancy 300 tablet 2 06/24/2019  . Blood Pressure Monitoring (BLOOD PRESSURE KIT) DEVI 1 Device by Does not apply route as needed. ICD 10:  O09.90 1 Device 0   . Butalbital-APAP-Caffeine 50-325-40 MG capsule Take 1 capsule by mouth every 6 (six) hours as needed for headache. (Patient not taking: Reported on 10/20/2018) 30 capsule 0   . diphenhydramine-acetaminophen (TYLENOL PM) 25-500 MG TABS tablet Take 1 tablet by mouth at bedtime as needed.   Unknown at Unknown time  . docusate sodium (COLACE) 100 MG capsule Take 1 capsule (100 mg total) by mouth 2 (two) times daily. 10 capsule 0 Unknown at Unknown time  . ondansetron (ZOFRAN ODT) 4 MG disintegrating tablet Take 1 tablet (4 mg total) by mouth every 6 (six) hours as needed for nausea. 20 tablet 0 Unknown at Unknown time  . polyethylene glycol (MIRALAX) 17 g packet  Take 17 g by mouth daily. 14 each 0 Unknown at Unknown time  . Prenatal Vit-Fe Fumarate-FA (PREPLUS) 27-1 MG TABS Take 1 tablet by mouth daily. 30 tablet 13   . valACYclovir (VALTREX) 1000 MG tablet Take 1 tablet (1,000 mg total) by mouth daily. (Patient not taking: Reported on 10/20/2018) 30 tablet 1    No results found for this or any previous visit (from the past 48 hour(s)).  Review of Systems  Constitutional: Negative.   HENT: Negative.   Eyes: Negative.   Respiratory: Negative.   Cardiovascular: Positive for chest pain (with walking).  Gastrointestinal: Negative.   Endocrine: Negative.   Genitourinary: Negative.   Musculoskeletal: Negative.   Skin: Negative.   Allergic/Immunologic: Negative.   Neurological: Positive for dizziness and headaches. Negative for light-headedness.  Hematological: Negative.   Psychiatric/Behavioral: Negative.    Physical Exam   Blood pressure (!) 144/58, pulse 96, temperature 98.2 F (36.8 C), temperature source Oral, resp. rate 18, last menstrual period 03/02/2019, SpO2 100 %.  Physical Exam  Constitutional: She is oriented to person, place, and time. She appears well-developed and well-nourished. No distress.  HENT:  Head: Normocephalic.  Eyes: Pupils are equal, round, and reactive to light.  Neck: Normal range of motion.  Cardiovascular: Normal rate, regular rhythm, normal heart sounds and intact distal pulses.  Respiratory: Effort normal and breath sounds normal.  GI: Soft. Bowel sounds are decreased.  Musculoskeletal: Normal range of motion.  Neurological: She is alert and oriented to person, place, and time.  Skin: Skin is warm and dry. She is not diaphoretic.  Psychiatric: Her speech is normal and behavior is normal. Judgment and thought content normal. Cognition and memory are normal.  Flat affect   FHTs by doppler: 162 bpm  MAU Course  Procedures  None  MDM  Pepcid PO EKG: CBC:  Orthostatic vitals  + fetal heart tones via  doppler  Report given to [place] who resumes care of the patient   Noni Saupe 07/08/2019, 3:32 PM  Results for orders placed or performed during the hospital encounter of 07/08/19 (from the past 24 hour(s))  CBC with Differential/Platelet     Status: Abnormal   Collection Time: 07/08/19  2:57 PM  Result Value Ref Range   WBC 8.3 4.0 - 10.5 K/uL   RBC 3.40 (L) 3.87 - 5.11 MIL/uL  Hemoglobin 10.7 (L) 12.0 - 15.0 g/dL   HCT 32.8 (L) 36.0 - 46.0 %   MCV 96.5 80.0 - 100.0 fL   MCH 31.5 26.0 - 34.0 pg   MCHC 32.6 30.0 - 36.0 g/dL   RDW 12.8 11.5 - 15.5 %   Platelets 323 150 - 400 K/uL   nRBC 0.0 0.0 - 0.2 %   Neutrophils Relative % 70 %   Neutro Abs 5.9 1.7 - 7.7 K/uL   Lymphocytes Relative 22 %   Lymphs Abs 1.9 0.7 - 4.0 K/uL   Monocytes Relative 6 %   Monocytes Absolute 0.5 0.1 - 1.0 K/uL   Eosinophils Relative 1 %   Eosinophils Absolute 0.1 0.0 - 0.5 K/uL   Basophils Relative 0 %   Basophils Absolute 0.0 0.0 - 0.1 K/uL   Immature Granulocytes 1 %   Abs Immature Granulocytes 0.05 0.00 - 0.07 K/uL    *Consult with Dr. Darl Householder @ 1725 - notified of patient's complaints, assessments, lab & EKG results  since OB status is cleared, CNM requests to transfer to [facility] for further cardiac evaluation - agrees with plan  Assessment and Plan  Chest pain of unknown etiology  - After review of EKG with Dr. Dione Plover and d/t abnormal EKG and h/o abnormal EKG, determination is made to transfer patient to [facility] for further cardiac evaluation - Unknown family h/o cardiac medical problems  Dizziness  - Possibly d/t not eating enough food and going long hours without eating - Transfer to [facility]  Acute intractable headache, unspecified headache type - Will try Tylenol 1000 mg po for H/A - Needs regular diet   Pregnancy - Reassurance given that pregnancy is ok at the time of evaluation - Continue OB care as scheduled  Laury Deep, CNM 07/08/2019 5:29 PM

## 2019-07-08 NOTE — Discharge Instructions (Addendum)
Take Maalox as directed. Follow up with OB as scheduled. Return to ER for worsening or concerning symptoms.

## 2019-07-08 NOTE — MAU Note (Signed)
Pt was at work [employer]) when she felt dizzy and about to faint.  "I caught myself and sat down and called EMS."  Reports feeling nauseated and dizzy.  C/o back pain that started this morning when she woke up and abdominal pain that started yesterday.  Reports vaginal bleeding when she woke up this morning.  None now.

## 2019-07-08 NOTE — ED Notes (Signed)
Pt from MAU (pt [redacted] weeks pregnant) for further evaluation of CP; pt arrived at MAU this afternoon for evaluation of a syncopal episode around 1300 today; pt also c/o central chest pressure that began around the same time; pt endorses diaphoreses and sob that accompanied the CP and syncopal episode; chest pressure currently 6/10, denies radiation; states she has had CP off and on for the past year, worse with pregnancy.
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
## 2019-07-14 ENCOUNTER — Other Ambulatory Visit: Payer: Self-pay

## 2019-07-14 ENCOUNTER — Ambulatory Visit (HOSPITAL_COMMUNITY)
Admission: RE | Admit: 2019-07-14 | Discharge: 2019-07-14 | Disposition: A | Discharge status: Home / Self Care | Payer: Medicaid Other | Source: Ambulatory Visit | Attending: Obstetrics and Gynecology | Admitting: Obstetrics and Gynecology

## 2019-07-14 ENCOUNTER — Encounter: Payer: Self-pay | Admitting: Obstetrics & Gynecology

## 2019-07-14 ENCOUNTER — Ambulatory Visit (HOSPITAL_COMMUNITY): Payer: Medicaid Other | Admitting: *Deleted

## 2019-07-14 ENCOUNTER — Encounter (HOSPITAL_COMMUNITY): Payer: Self-pay

## 2019-07-14 DIAGNOSIS — Z8679 Personal history of other diseases of the circulatory system: Secondary | ICD-10-CM

## 2019-07-14 DIAGNOSIS — Z6791 Unspecified blood type, Rh negative: Secondary | ICD-10-CM | POA: Diagnosis present

## 2019-07-14 DIAGNOSIS — O0940 Supervision of pregnancy with grand multiparity, unspecified trimester: Secondary | ICD-10-CM | POA: Insufficient documentation

## 2019-07-14 DIAGNOSIS — O34219 Maternal care for unspecified type scar from previous cesarean delivery: Secondary | ICD-10-CM | POA: Diagnosis not present

## 2019-07-14 DIAGNOSIS — O26899 Other specified pregnancy related conditions, unspecified trimester: Secondary | ICD-10-CM | POA: Diagnosis present

## 2019-07-14 DIAGNOSIS — O360190 Maternal care for anti-D [Rh] antibodies, unspecified trimester, not applicable or unspecified: Secondary | ICD-10-CM

## 2019-07-14 DIAGNOSIS — O09892 Supervision of other high risk pregnancies, second trimester: Secondary | ICD-10-CM

## 2019-07-14 DIAGNOSIS — O09899 Supervision of other high risk pregnancies, unspecified trimester: Secondary | ICD-10-CM | POA: Diagnosis present

## 2019-07-14 DIAGNOSIS — O99212 Obesity complicating pregnancy, second trimester: Secondary | ICD-10-CM

## 2019-07-14 DIAGNOSIS — R0789 Other chest pain: Secondary | ICD-10-CM | POA: Insufficient documentation

## 2019-07-14 DIAGNOSIS — R079 Chest pain, unspecified: Secondary | ICD-10-CM

## 2019-07-14 DIAGNOSIS — O0990 Supervision of high risk pregnancy, unspecified, unspecified trimester: Secondary | ICD-10-CM

## 2019-07-14 DIAGNOSIS — O10919 Unspecified pre-existing hypertension complicating pregnancy, unspecified trimester: Secondary | ICD-10-CM | POA: Insufficient documentation

## 2019-07-14 DIAGNOSIS — O43129 Velamentous insertion of umbilical cord, unspecified trimester: Secondary | ICD-10-CM | POA: Insufficient documentation

## 2019-07-14 DIAGNOSIS — O10012 Pre-existing essential hypertension complicating pregnancy, second trimester: Secondary | ICD-10-CM

## 2019-07-14 DIAGNOSIS — O09292 Supervision of pregnancy with other poor reproductive or obstetric history, second trimester: Secondary | ICD-10-CM

## 2019-07-14 DIAGNOSIS — O0942 Supervision of pregnancy with grand multiparity, second trimester: Secondary | ICD-10-CM

## 2019-07-14 DIAGNOSIS — Z3A19 19 weeks gestation of pregnancy: Secondary | ICD-10-CM

## 2019-07-14 DIAGNOSIS — O09212 Supervision of pregnancy with history of pre-term labor, second trimester: Secondary | ICD-10-CM | POA: Diagnosis not present

## 2019-07-15 ENCOUNTER — Other Ambulatory Visit: Payer: Medicaid Other

## 2019-07-15 ENCOUNTER — Ambulatory Visit (INDEPENDENT_AMBULATORY_CARE_PROVIDER_SITE_OTHER): Payer: Medicaid Other | Admitting: Obstetrics & Gynecology

## 2019-07-15 ENCOUNTER — Other Ambulatory Visit (HOSPITAL_COMMUNITY): Payer: Self-pay | Admitting: *Deleted

## 2019-07-15 ENCOUNTER — Encounter: Payer: Self-pay | Admitting: Family Medicine

## 2019-07-15 VITALS — BP 100/73 | HR 96 | Wt 259.0 lb

## 2019-07-15 DIAGNOSIS — Z3A19 19 weeks gestation of pregnancy: Secondary | ICD-10-CM

## 2019-07-15 DIAGNOSIS — Z6841 Body Mass Index (BMI) 40.0 and over, adult: Secondary | ICD-10-CM

## 2019-07-15 DIAGNOSIS — O43122 Velamentous insertion of umbilical cord, second trimester: Secondary | ICD-10-CM

## 2019-07-15 DIAGNOSIS — R55 Syncope and collapse: Secondary | ICD-10-CM

## 2019-07-15 DIAGNOSIS — O0990 Supervision of high risk pregnancy, unspecified, unspecified trimester: Secondary | ICD-10-CM

## 2019-07-15 DIAGNOSIS — O09899 Supervision of other high risk pregnancies, unspecified trimester: Secondary | ICD-10-CM

## 2019-07-15 DIAGNOSIS — O10919 Unspecified pre-existing hypertension complicating pregnancy, unspecified trimester: Secondary | ICD-10-CM

## 2019-07-15 DIAGNOSIS — F70 Mild intellectual disabilities: Secondary | ICD-10-CM

## 2019-07-15 DIAGNOSIS — O0992 Supervision of high risk pregnancy, unspecified, second trimester: Secondary | ICD-10-CM

## 2019-07-15 DIAGNOSIS — O34219 Maternal care for unspecified type scar from previous cesarean delivery: Secondary | ICD-10-CM
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
## 2019-07-19 ENCOUNTER — Encounter: Payer: Self-pay | Admitting: Physician Assistant

## 2019-07-19 ENCOUNTER — Other Ambulatory Visit: Payer: Self-pay

## 2019-07-19 ENCOUNTER — Ambulatory Visit (INDEPENDENT_AMBULATORY_CARE_PROVIDER_SITE_OTHER): Payer: Medicaid Other | Admitting: Physician Assistant

## 2019-07-19 ENCOUNTER — Telehealth: Payer: Self-pay | Admitting: *Deleted

## 2019-07-19 VITALS — BP 129/91 | HR 97 | Ht 62.0 in | Wt 260.8 lb

## 2019-07-19 DIAGNOSIS — R002 Palpitations: Secondary | ICD-10-CM

## 2019-07-19 DIAGNOSIS — R079 Chest pain, unspecified: Secondary | ICD-10-CM

## 2019-07-19 DIAGNOSIS — R0789 Other chest pain: Secondary | ICD-10-CM

## 2019-07-19 DIAGNOSIS — R55 Syncope and collapse: Secondary | ICD-10-CM | POA: Diagnosis not present

## 2019-07-19 DIAGNOSIS — Z3A19 19 weeks gestation of pregnancy: Secondary | ICD-10-CM

## 2019-07-19 NOTE — Patient Instructions (Addendum)
Medication Instructions:  Your physician recommends that you continue on your current medications as directed. Please refer to the Current Medication list given to you today.   If you need a refill on your cardiac medications before your next appointment, please call your pharmacy.   Lab work: NONE ordered at this time of appointment   If you have labs (blood work) drawn today and your tests are completely normal, you will receive your results only by: Marland Kitchen MyChart Message (if you have MyChart) OR . A paper copy in the mail If you have any lab test that is abnormal or we need to change your treatment, we will call you to review the results.  Testing/Procedures: Your physician has requested that you have an echocardiogram. Echocardiography is a painless test that uses sound waves to create images of your heart. It provides your doctor with information about the size and shape of your heart and how well your heart's chambers and valves are working. This procedure takes approximately one hour. There are no restrictions for this procedure.  Please schedule for 2 weeks   Your physician has recommended that you wear a 14 DAY ZIO-PATCH monitor. The Zio patch cardiac monitor continuously records heart rhythm data for up to 14 days, this is for patients being evaluated for multiple types heart rhythms. For the first 24 hours post application, please avoid getting the Zio monitor wet in the shower or by excessive sweating during exercise. After that, feel free to carry on with regular activities. Keep soaps and lotions away from the ZIO XT Patch.  This will be placed at our [place] location - [address], [address].         Follow-Up: At [facility], you and your health needs are our priority.  As part of our continuing mission to provide you with exceptional heart care, we have created designated Provider Care Teams.  These Care Teams include your primary Cardiologist (physician) and Advanced  Practice Providers (APPs -  Physician Assistants and Nurse Practitioners) who all work together to provide you with the care you need, when you need it. You will need a follow up appointment in 3 months.  Please call our office 2 months in advance to schedule this appointment.  You may see Skeet Latch, MD or one of the following Advanced Practice Providers on your designated Care Team:   Kerin Ransom, PA-C Roby Lofts, [place] . Sande Rives, PA-C  Any Other Special Instructions Will Be Listed Below (If Applicable).

## 2019-07-19 NOTE — Addendum Note (Signed)
Addended by: Jacqulynn Cadet on: 07/19/2019 10:14 AM   Modules accepted: Orders

## 2019-07-19 NOTE — Telephone Encounter (Signed)
14 day ZIO XT long term holter monitor to be mailed to patients home.  Instructions reviewed briefly as they are included in the monitor kit.

## 2019-07-19 NOTE — Progress Notes (Signed)
Cardiology Office Note    Date:  07/19/2019   ID:  [name] [name], DOB 23-Apr-1986, MRN [id number]  PCP:  Kathrene Alu, MD  Cardiologist:  Dr. Oval Linsey   Chief Complaint  Patient presents with  . Follow-up    syncope. Seen for Dr. Oval Linsey    History of Present Illness:  [name] [name] is a 33 y.o. female with PMH of chronic hypertension, morbid obesity and history of syncope and tachycardia.  Patient was initially referred to cardiology service for evaluation of syncope and tachycardia.  Symptoms typically occur after standing up for a few minutes.  It was suspected her symptom is associated with orthostasis during pregnancy.  She was instructed to increase her fluid intake.  She also had mild hyperthyroidism with elevated T3 and low TSH at the time.  This resolved on subsequent recheck.  The combination of hypovolemia and hyperthyroidism likely contributed to the tachycardia during pregnancy.  She was last seen by Dr. Oval Linsey in October 2019 who recommended a echocardiogram to rule out structural heart disease and also a sleep study because she has symptoms consistent with obstructive sleep apnea.  Unfortunately neither study was done before she was admitted on 09/21/2018 with premature rupture of membrane at 33 weeks of pregnancy and had a successful delivery of twin girls via C-section.    She went to the hospital again on 05/09/2019 for vaginal bleeding, lab work at that time confirmed she is roughly [redacted] weeks pregnant.  More recently, she had another syncope on 06/17/2019.  Recent prenatal ultrasound confirms the presence of a baby boy. She returned to the ED on 07/08/2019 with chest pain.  Chest pain quickly resolved with Maalox.  EKG was negative.  Symptom was consistent with GERD.  She presents today for cardiology office evaluation.  She says during the current pregnancy, she has passed out twice so far.  Prior to passing out spell, she has flushing sensation and dizziness.  Most recent  passing out spell occurred about 1 and half weeks ago.  She also have occasional feeling of bilateral arm tingling and skipped heartbeat.  I plan to obtain Zio patch monitor for the next 2 weeks.  She will also need a echocardiogram to rule out structural heart disease.  At this time, she continue to work at [employer].  It is not unreasonable for her to be on medical leave at this point as I suspect her passing out spell is due to vasovagal syncope related to the current pregnancy.  If the echocardiogram and Zio patch are normal, I would expect her symptoms to completely resolve after delivery.  However prior to her delivery, I suspect there is strong likelihood for the symptoms to recur.  Note: Orthostatic vital signs was obtained during today's visit  Lying 137/94 heart rate is 96 Sitting 177/83 heart rate 94 Standing 0-minute 123/83 heart rate 104 Standing 3 minutes 139/85 heart rate 107   Past Medical History:  Diagnosis Date  . Abnormal Pap smear    f/u was normal  . Chronic hypertension   . DEPRESSION, MAJOR, RECURRENT, MODERATE 04/18/2010   Qualifier: Diagnosis of  By: Georgina Snell MD, Ellard Artis    . Headache(784.0)   . History of abnormal Pap smear 07/28/2012  . History of preterm delivery 11/04/2018  . Infection    trich, chlamydia, gonorrhea  . Ovarian cyst   . Urinary tract infection     Past Surgical History:  Procedure Laterality Date  . CESAREAN SECTION    .  CESAREAN SECTION N/A 09/23/2018   Procedure: CESAREAN SECTION;  Surgeon: Mora Bellman, MD;  Location: [place];  Service: Obstetrics;  Laterality: N/A;  . TONSILLECTOMY    . WISDOM TOOTH EXTRACTION      Current Medications: Outpatient Medications Prior to Visit  Medication Sig Dispense Refill  . AMBULATORY NON FORMULARY MEDICATION 1 Device by Other route once a week. Blood Pressure Cuff/ Medium Monitored Regularly at home ICD 10: O09.90 (Patient not taking: Reported on 06/01/2019) 1 kit 0  . aspirin EC 81 MG  tablet Take 1 tablet (81 mg total) by mouth daily. Take after 12 weeks for prevention of preeclampsia later in pregnancy (Patient not taking: Reported on 07/14/2019) 300 tablet 2  . Blood Pressure Monitoring (BLOOD PRESSURE KIT) DEVI 1 Device by Does not apply route as needed. ICD 10:  O09.90 (Patient not taking: Reported on 07/15/2019) 1 Device 0  . Butalbital-APAP-Caffeine 50-325-40 MG capsule Take 1 capsule by mouth every 6 (six) hours as needed for headache. (Patient not taking: Reported on 07/15/2019) 30 capsule 0  . diphenhydramine-acetaminophen (TYLENOL PM) 25-500 MG TABS tablet Take 1 tablet by mouth at bedtime as needed.    . docusate sodium (COLACE) 100 MG capsule Take 1 capsule (100 mg total) by mouth 2 (two) times daily. (Patient not taking: Reported on 07/14/2019) 10 capsule 0  . ondansetron (ZOFRAN ODT) 4 MG disintegrating tablet Take 1 tablet (4 mg total) by mouth every 6 (six) hours as needed for nausea. (Patient not taking: Reported on 07/14/2019) 20 tablet 0  . polyethylene glycol (MIRALAX) 17 g packet Take 17 g by mouth daily. (Patient not taking: Reported on 07/14/2019) 14 each 0  . Prenatal Vit-Fe Fumarate-FA (PREPLUS) 27-1 MG TABS Take 1 tablet by mouth daily. (Patient not taking: Reported on 07/14/2019) 30 tablet 13  . valACYclovir (VALTREX) 1000 MG tablet Take 1 tablet (1,000 mg total) by mouth daily. (Patient not taking: Reported on 10/20/2018) 30 tablet 1   No facility-administered medications prior to visit.      Allergies:   Patient has no known allergies.   Social History   Socioeconomic History  . Marital status: Single    Spouse name: Not on file  . Number of children: 4  . Years of education: 47  . Highest education level: Not on file  Occupational History    Employer: [employer]  . Financial resource strain: Not on file  . Food insecurity    Worry: Never true    Inability: Never true  . Transportation needs    Medical: No    Non-medical: No   Tobacco Use  . Smoking status: Never Smoker  . Smokeless tobacco: Never Used  Substance and Sexual Activity  . Alcohol use: Not Currently    Alcohol/week: 2.0 standard drinks    Types: 1 Glasses of wine, 1 Shots of liquor per week    Comment: Not currently. Last use May 2020  . Drug use: Not Currently    Types: Marijuana    Comment: Not currently. Last use 2017  . Sexual activity: Yes    Birth control/protection: None  Lifestyle  . Physical activity    Days per week: Not on file    Minutes per session: Not on file  . Stress: Not on file  Relationships  . Social Herbalist on phone: Not on file    Gets together: Not on file    Attends religious service: Not on file  Active member of club or organization: Not on file    Attends meetings of clubs or organizations: Not on file    Relationship status: Not on file  Other Topics Concern  . Not on file  Social History Narrative   Lives at home with four children   Drinks soda occasionally      Family History:  The patient's family history includes Arthritis in her mother; Asthma in her brother; Cancer in her maternal grandmother and maternal uncle; Diabetes in her maternal grandmother; Hypertension in her father, maternal grandmother, and mother.   ROS:   Please see the history of present illness.    ROS All other systems reviewed and are negative.   PHYSICAL EXAM:   VS:  BP (!) 129/91   Pulse 97   Ht 5' 2"  (1.575 m)   Wt 260 lb 12.8 oz (118.3 kg)   LMP 03/02/2019   SpO2 98%   BMI 47.70 kg/m    GEN: Well nourished, well developed, in no acute distress  HEENT: normal  Neck: no JVD, carotid bruits, or masses Cardiac: RRR; no murmurs, rubs, or gallops,no edema  Respiratory:  clear to auscultation bilaterally, normal work of breathing GI: soft, nontender, nondistended, + BS MS: no deformity or atrophy  Skin: warm and dry, no rash Neuro:  Alert and Oriented x 3, Strength and sensation are intact Psych:  euthymic mood, full affect  Wt Readings from Last 3 Encounters:  07/19/19 260 lb 12.8 oz (118.3 kg)  07/15/19 259 lb (117.5 kg)  07/08/19 251 lb (113.9 kg)      Studies/Labs Reviewed:   EKG:  EKG is ordered today.  The ekg ordered today demonstrates normal sinus rhythm without significant ST-T wave changes.  Recent Labs: 06/01/2019: ALT 32; TSH 0.645 06/17/2019: BUN 8; Creatinine, Ser 0.53; Potassium 4.1; Sodium 137 07/08/2019: Hemoglobin 10.7; Platelets 323   Lipid Panel No results found for: CHOL, TRIG, HDL, CHOLHDL, VLDL, LDLCALC, LDLDIRECT  Additional studies/ records that were reviewed today include:   N/A    ASSESSMENT:    1. Syncope, unspecified syncope type   2. [redacted] weeks gestation of pregnancy   3. Chest pain of uncertain etiology   4. Palpitations      PLAN:  In order of problems listed above:  1. Syncope: Likely vasovagal syncope based on symptom.  She had history of syncope during her previous pregnancy as well.  I plan to obtain an echocardiogram and a 2-week Zio patch heart monitor to rule out structural heart disease and irregular rhythm.  If both are normal, no further cardiac work-up is expected.  Unfortunately with vasovagal syncope, it is unlikely that we can prevent further spell.  Symptoms may be improved with adequate hydration, however may not be completely eliminated.  Ultimate solution would be delivery of the baby.  I expect her to have resolution of symptoms after her delivery.  Therefore, it is reasonable to consider medical leave for the time being.  2. Pregnancy: She is currently 19 weeks and 6 days pregnant.  3. Chest pain: EKG showed no sign of ischemia.  Symptoms occur at random and does not associate with exertion.  OB/GYN service recommended baby aspirin daily.  I would not recommend any invasive or radioactive work-up during pregnancy.  4. Palpitation: Tachycardic at baseline related to pregnancy.  Recommended 2-week Zio patch monitor to rule  out significant arrhythmia.   Medication Adjustments/Labs and Tests Ordered: Current medicines are reviewed at length with the patient today.  Concerns regarding medicines are outlined above.  Medication changes, Labs and Tests ordered today are listed in the Patient Instructions below. Patient Instructions  Medication Instructions:  Your physician recommends that you continue on your current medications as directed. Please refer to the Current Medication list given to you today.  If you need a refill on your cardiac medications before your next appointment, please call your pharmacy.   Lab work: NONE ordered at this time of appointment   If you have labs (blood work) drawn today and your tests are completely normal, you will receive your results only by: Marland Kitchen MyChart Message (if you have MyChart) OR . A paper copy in the mail If you have any lab test that is abnormal or we need to change your treatment, we will call you to review the results.  Testing/Procedures: Your physician has requested that you have an Echocardiogram. Echocardiography is a painless test that uses sound waves to create images of your heart. It provides your doctor with information about the size and shape of your heart and how well your heart's chambers and valves are working. This procedure takes approximately one hour. There are no restrictions for this procedure.   Please schedule for 2 weeks  Your physician has recommended that you wear a 14 DAY ZIO-PATCH monitor. The Zio patch cardiac monitor continuously records heart rhythm data for up to 14 days, this is for patients being evaluated for multiple types heart rhythms. For the first 24 hours post application, please avoid getting the Zio monitor wet in the shower or by excessive sweating during exercise. After that, feel free to carry on with regular activities. Keep soaps and lotions away from the ZIO XT Patch.  This will be placed at our [facility] location - [address].         Follow-Up: At [facility], you and your health needs are our priority.  As part of our continuing mission to provide you with exceptional heart care, we have created designated Provider Care Teams.  These Care Teams include your primary Cardiologist (physician) and Advanced Practice Providers (APPs -  Physician Assistants and Nurse Practitioners) who all work together to provide you with the care you need, when you need it. .   Any Other Special Instructions Will Be Listed Below (If Applicable).       Hilbert Corrigan, [place]  07/19/2019 10:03 AM    [place], [place], [place]  [address] Phone: [phone]; Fax: [phone]

## 2019-07-20 ENCOUNTER — Telehealth: Payer: Self-pay

## 2019-07-20 NOTE — Telephone Encounter (Signed)
Called pt and informed pt that we have her BP cuff here at the office if she could please come pick it up.  Pt stated that she will try and come this week to pick up cuff.

## 2019-07-21 ENCOUNTER — Ambulatory Visit (INDEPENDENT_AMBULATORY_CARE_PROVIDER_SITE_OTHER): Payer: Medicaid Other | Admitting: General Practice

## 2019-07-21 ENCOUNTER — Other Ambulatory Visit: Payer: Self-pay

## 2019-07-21 DIAGNOSIS — O10919 Unspecified pre-existing hypertension complicating pregnancy, unspecified trimester: Secondary | ICD-10-CM

## 2019-07-21 LAB — AFP, SERUM, OPEN SPINA BIFIDA
AFP MoM: 1.09
AFP Value: 43.9 ng/mL
Gest. Age on Collection Date: 19.3 weeks
Maternal Age At EDD: 31.7 yr
OSBR Risk 1 IN: 10000
Test Results:: NEGATIVE
Weight: 259 [lb_av]

## 2019-07-21 NOTE — Progress Notes (Signed)
Patient presents to office today to pick up BP cuff supplied by [facility]. BP Cuff given to patient. I asked patient if she had babyscripts and she states no she never received the email. Per review, patient was pregnant last year and thus hasn't received the registration email because one was already sent in the last calendar year. Told patient I will contact Babyscripts and she should receive that email within the next few hours, if not call us tomorrow to inform us. Discussed how to use app and log BP and discussed importance of use. Patient verbalized understanding to all.  Koren Bound RN BSN 07/21/19

## 2019-07-22 ENCOUNTER — Other Ambulatory Visit: Payer: Self-pay | Admitting: General Practice

## 2019-07-22 DIAGNOSIS — O0940 Supervision of pregnancy with grand multiparity, unspecified trimester: Secondary | ICD-10-CM

## 2019-07-24 ENCOUNTER — Ambulatory Visit (INDEPENDENT_AMBULATORY_CARE_PROVIDER_SITE_OTHER): Payer: Medicaid Other

## 2019-07-24 DIAGNOSIS — R55 Syncope and collapse: Secondary | ICD-10-CM | POA: Diagnosis not present

## 2019-07-24 DIAGNOSIS — R002 Palpitations: Secondary | ICD-10-CM | POA: Diagnosis not present

## 2019-08-02 ENCOUNTER — Other Ambulatory Visit (HOSPITAL_COMMUNITY): Payer: Medicaid Other

## 2019-08-11 ENCOUNTER — Other Ambulatory Visit: Payer: Self-pay

## 2019-08-11 ENCOUNTER — Other Ambulatory Visit (HOSPITAL_COMMUNITY): Payer: Self-pay | Admitting: *Deleted

## 2019-08-11 ENCOUNTER — Ambulatory Visit (HOSPITAL_COMMUNITY): Payer: Medicaid Other | Admitting: *Deleted

## 2019-08-11 ENCOUNTER — Encounter (HOSPITAL_COMMUNITY): Payer: Self-pay

## 2019-08-11 ENCOUNTER — Ambulatory Visit (HOSPITAL_COMMUNITY)
Admission: RE | Admit: 2019-08-11 | Discharge: 2019-08-11 | Disposition: A | Discharge status: Home / Self Care | Payer: Medicaid Other | Source: Ambulatory Visit | Attending: Obstetrics and Gynecology | Admitting: Obstetrics and Gynecology

## 2019-08-11 DIAGNOSIS — Z362 Encounter for other antenatal screening follow-up: Secondary | ICD-10-CM | POA: Diagnosis not present

## 2019-08-11 DIAGNOSIS — Z3A40 40 weeks gestation of pregnancy: Secondary | ICD-10-CM

## 2019-08-11 DIAGNOSIS — Z8679 Personal history of other diseases of the circulatory system: Secondary | ICD-10-CM | POA: Diagnosis present

## 2019-08-11 DIAGNOSIS — O99213 Obesity complicating pregnancy, third trimester: Secondary | ICD-10-CM | POA: Diagnosis not present

## 2019-08-11 DIAGNOSIS — R0789 Other chest pain: Secondary | ICD-10-CM | POA: Diagnosis present

## 2019-08-11 DIAGNOSIS — Z6791 Unspecified blood type, Rh negative: Secondary | ICD-10-CM | POA: Insufficient documentation

## 2019-08-11 DIAGNOSIS — O43122 Velamentous insertion of umbilical cord, second trimester: Secondary | ICD-10-CM | POA: Insufficient documentation

## 2019-08-11 DIAGNOSIS — O0990 Supervision of high risk pregnancy, unspecified, unspecified trimester: Secondary | ICD-10-CM | POA: Diagnosis present

## 2019-08-11 DIAGNOSIS — O09893 Supervision of other high risk pregnancies, third trimester: Secondary | ICD-10-CM

## 2019-08-11 DIAGNOSIS — O34219 Maternal care for unspecified type scar from previous cesarean delivery: Secondary | ICD-10-CM | POA: Diagnosis present

## 2019-08-11 DIAGNOSIS — O09213 Supervision of pregnancy with history of pre-term labor, third trimester: Secondary | ICD-10-CM | POA: Diagnosis not present

## 2019-08-11 DIAGNOSIS — O10919 Unspecified pre-existing hypertension complicating pregnancy, unspecified trimester: Secondary | ICD-10-CM | POA: Insufficient documentation

## 2019-08-11 DIAGNOSIS — O10013 Pre-existing essential hypertension complicating pregnancy, third trimester: Secondary | ICD-10-CM | POA: Diagnosis not present

## 2019-08-11 DIAGNOSIS — O360130 Maternal care for anti-D [Rh] antibodies, third trimester, not applicable or unspecified: Secondary | ICD-10-CM

## 2019-08-11 DIAGNOSIS — O09293 Supervision of pregnancy with other poor reproductive or obstetric history, third trimester: Secondary | ICD-10-CM

## 2019-08-11 DIAGNOSIS — O26899 Other specified pregnancy related conditions, unspecified trimester: Secondary | ICD-10-CM | POA: Insufficient documentation

## 2019-08-11 DIAGNOSIS — O0943 Supervision of pregnancy with grand multiparity, third trimester: Secondary | ICD-10-CM

## 2019-08-11 DIAGNOSIS — R079 Chest pain, unspecified: Secondary | ICD-10-CM

## 2019-08-12 ENCOUNTER — Encounter: Payer: Medicaid Other | Admitting: Family Medicine

## 2019-08-19 ENCOUNTER — Other Ambulatory Visit (HOSPITAL_COMMUNITY): Payer: Medicaid Other

## 2019-08-25 ENCOUNTER — Other Ambulatory Visit (HOSPITAL_COMMUNITY): Payer: Medicaid Other

## 2019-08-26 ENCOUNTER — Other Ambulatory Visit: Payer: Self-pay

## 2019-08-26 ENCOUNTER — Other Ambulatory Visit: Payer: Self-pay | Admitting: Family Medicine

## 2019-08-26 ENCOUNTER — Ambulatory Visit (HOSPITAL_COMMUNITY): Payer: Medicaid Other | Attending: Cardiology

## 2019-08-26 ENCOUNTER — Telehealth: Payer: Self-pay | Admitting: Internal Medicine

## 2019-08-26 DIAGNOSIS — R079 Chest pain, unspecified: Secondary | ICD-10-CM

## 2019-08-26 DIAGNOSIS — I5189 Other ill-defined heart diseases: Secondary | ICD-10-CM

## 2019-08-26 DIAGNOSIS — R0789 Other chest pain: Secondary | ICD-10-CM | POA: Insufficient documentation

## 2019-08-26 DIAGNOSIS — D151 Benign neoplasm of heart: Secondary | ICD-10-CM | POA: Insufficient documentation

## 2019-08-26 DIAGNOSIS — R55 Syncope and collapse: Secondary | ICD-10-CM | POA: Diagnosis not present

## 2019-08-26 MED ORDER — ENOXAPARIN SODIUM 120 MG/0.8ML ~~LOC~~ SOLN: 120.0000 mg | Freq: Two times a day (BID) | SUBCUTANEOUS | 1 refills | Status: DC

## 2019-08-26 NOTE — Telephone Encounter (Signed)
Phone Note  I was called as DOD regarding patient [name] [name] who has undergone a 2D echo due to syncope in the setting of being [redacted] weeks pregnant.   I have reviewed the echo with Dr. Stanford Breed and Dr. Aundra Dubin. 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 Unclear if it extends down to the IVC or up to the SVC but was not seen there. Mass is mobile.  I called OBGYN on call. I discussed findings with the patient who is asymptomatic. She will go to the [facility] clinic [address] on Monday and be started on Lovenox. She will be maintained on Lovenox for 3 months and have a repeat echo. If mass is gone, then thrombus will be assumed to have been the etiology. 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   Followup in cardiology in 3 months. The OBGYN service to manage prescribing and dosing of anti-coagulation.  I warned the patient that if she were to experience severe chest pain or sob or syncope this weekend to go to the ED.  Mikle Bosworth.D.

## 2019-08-26 NOTE — Progress Notes (Signed)
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 Advised by pharmacy to use full body weight for anti-coagulation. Rx sent to pharmacy and note to office to call her in for teaching ASAP Monday am.

## 2019-08-29 ENCOUNTER — Ambulatory Visit (INDEPENDENT_AMBULATORY_CARE_PROVIDER_SITE_OTHER): Payer: Medicaid Other

## 2019-08-29 ENCOUNTER — Telehealth: Payer: Self-pay | Admitting: Family Medicine

## 2019-08-29 ENCOUNTER — Telehealth: Payer: Self-pay | Admitting: *Deleted

## 2019-08-29 ENCOUNTER — Other Ambulatory Visit: Payer: Self-pay

## 2019-08-29 DIAGNOSIS — O0990 Supervision of high risk pregnancy, unspecified, unspecified trimester: Secondary | ICD-10-CM

## 2019-08-29 MED ORDER — PROMETHAZINE HCL 25 MG PO TABS: 25.0000 mg | ORAL_TABLET | Freq: Four times a day (QID) | ORAL | 0 refills | Status: DC | PRN

## 2019-08-29 NOTE — Telephone Encounter (Signed)
Attempted to reach patient twice about coming in today per the request of the provider, and it was imperative that we reach her today.

## 2019-08-29 NOTE — Telephone Encounter (Signed)
See phone note 08/26/19 Dr Lovena Le spoke with patient and on call OBGYN  Discussed with Dr Oval Linsey and will just see patient in follow up as scheduled in November

## 2019-08-29 NOTE — Telephone Encounter (Signed)
-----   Message from Skeet Latch, MD sent at 08/28/2019 11:12 AM EDT ----- Please set her up for cardiac MRI to evaluate for RA mass and syncope.

## 2019-08-29 NOTE — Progress Notes (Signed)
Patient seen and assessed by nursing staff.  Agree with documentation and plan.  

## 2019-08-29 NOTE — Progress Notes (Signed)
Pt presents to office today for Lovenox teaching. Pt has not picked medication up from pharmacy. Pt states that she would not like to wait for teaching.   Administration instructions given to patient. Patient is able to teach back process of administration including where to administer medicine. Discussed how to dispose of needles safely. Discussed increased risk of bleeding while on this medication.   Pt reports ongoing nausea with no relief from Zofran. Phenergan 25mg  Q6 rx sent to preferred pharmacy.  Pt will follow up at regular prenatal visit on Wednesday with any additional concerns.   Apolonio Schneiders RN 08/29/19

## 2019-08-30 ENCOUNTER — Telehealth: Payer: Self-pay | Admitting: Family Medicine

## 2019-08-30 NOTE — Telephone Encounter (Signed)
Attempted to call patient about her appointment on 9/30 @ 9:15. No answer and could not leave a message because the number stated that the call could not be completed at this time.

## 2019-08-31 ENCOUNTER — Encounter: Payer: Medicaid Other | Admitting: Family Medicine

## 2019-09-08 ENCOUNTER — Ambulatory Visit (HOSPITAL_COMMUNITY)
Admission: RE | Admit: 2019-09-08 | Discharge: 2019-09-08 | Disposition: A | Discharge status: Home / Self Care | Payer: Medicaid Other | Source: Ambulatory Visit | Attending: Obstetrics and Gynecology | Admitting: Obstetrics and Gynecology

## 2019-09-08 ENCOUNTER — Other Ambulatory Visit (HOSPITAL_COMMUNITY): Payer: Self-pay | Admitting: *Deleted

## 2019-09-08 ENCOUNTER — Encounter (HOSPITAL_COMMUNITY): Payer: Self-pay

## 2019-09-08 ENCOUNTER — Ambulatory Visit (HOSPITAL_COMMUNITY): Payer: Medicaid Other | Admitting: *Deleted

## 2019-09-08 ENCOUNTER — Other Ambulatory Visit: Payer: Self-pay

## 2019-09-08 DIAGNOSIS — R079 Chest pain, unspecified: Secondary | ICD-10-CM | POA: Diagnosis present

## 2019-09-08 DIAGNOSIS — Z8679 Personal history of other diseases of the circulatory system: Secondary | ICD-10-CM

## 2019-09-08 DIAGNOSIS — O99212 Obesity complicating pregnancy, second trimester: Secondary | ICD-10-CM

## 2019-09-08 DIAGNOSIS — O34219 Maternal care for unspecified type scar from previous cesarean delivery: Secondary | ICD-10-CM | POA: Insufficient documentation

## 2019-09-08 DIAGNOSIS — O10919 Unspecified pre-existing hypertension complicating pregnancy, unspecified trimester: Secondary | ICD-10-CM | POA: Insufficient documentation

## 2019-09-08 DIAGNOSIS — O0990 Supervision of high risk pregnancy, unspecified, unspecified trimester: Secondary | ICD-10-CM | POA: Insufficient documentation

## 2019-09-08 DIAGNOSIS — O09212 Supervision of pregnancy with history of pre-term labor, second trimester: Secondary | ICD-10-CM | POA: Diagnosis not present

## 2019-09-08 DIAGNOSIS — O10012 Pre-existing essential hypertension complicating pregnancy, second trimester: Secondary | ICD-10-CM | POA: Diagnosis not present

## 2019-09-08 DIAGNOSIS — O0942 Supervision of pregnancy with grand multiparity, second trimester: Secondary | ICD-10-CM

## 2019-09-08 DIAGNOSIS — O360120 Maternal care for anti-D [Rh] antibodies, second trimester, not applicable or unspecified: Secondary | ICD-10-CM

## 2019-09-08 DIAGNOSIS — Z3A27 27 weeks gestation of pregnancy: Secondary | ICD-10-CM

## 2019-09-08 DIAGNOSIS — Z362 Encounter for other antenatal screening follow-up: Secondary | ICD-10-CM | POA: Diagnosis not present

## 2019-09-08 DIAGNOSIS — O09892 Supervision of other high risk pregnancies, second trimester: Secondary | ICD-10-CM

## 2019-09-08 DIAGNOSIS — O09292 Supervision of pregnancy with other poor reproductive or obstetric history, second trimester: Secondary | ICD-10-CM

## 2019-09-09 ENCOUNTER — Encounter: Payer: Medicaid Other | Admitting: Obstetrics & Gynecology

## 2019-09-21 ENCOUNTER — Other Ambulatory Visit: Payer: Self-pay

## 2019-09-21 ENCOUNTER — Ambulatory Visit (INDEPENDENT_AMBULATORY_CARE_PROVIDER_SITE_OTHER): Payer: Medicaid Other | Admitting: Obstetrics and Gynecology

## 2019-09-21 ENCOUNTER — Encounter: Payer: Self-pay | Admitting: Obstetrics and Gynecology

## 2019-09-21 ENCOUNTER — Other Ambulatory Visit (HOSPITAL_COMMUNITY)
Admission: RE | Admit: 2019-09-21 | Discharge: 2019-09-21 | Disposition: A | Discharge status: Home / Self Care | Payer: Medicaid Other | Source: Ambulatory Visit | Attending: Obstetrics & Gynecology | Admitting: Obstetrics & Gynecology

## 2019-09-21 VITALS — BP 122/84 | HR 101 | Temp 98.7°F | Wt 259.9 lb

## 2019-09-21 DIAGNOSIS — Z113 Encounter for screening for infections with a predominantly sexual mode of transmission: Secondary | ICD-10-CM

## 2019-09-21 DIAGNOSIS — O43123 Velamentous insertion of umbilical cord, third trimester: Secondary | ICD-10-CM

## 2019-09-21 DIAGNOSIS — O99213 Obesity complicating pregnancy, third trimester: Secondary | ICD-10-CM

## 2019-09-21 DIAGNOSIS — O34219 Maternal care for unspecified type scar from previous cesarean delivery: Secondary | ICD-10-CM

## 2019-09-21 DIAGNOSIS — Z3A29 29 weeks gestation of pregnancy: Secondary | ICD-10-CM

## 2019-09-21 DIAGNOSIS — O0993 Supervision of high risk pregnancy, unspecified, third trimester: Secondary | ICD-10-CM

## 2019-09-21 DIAGNOSIS — O10919 Unspecified pre-existing hypertension complicating pregnancy, unspecified trimester: Secondary | ICD-10-CM

## 2019-09-21 DIAGNOSIS — O0940 Supervision of pregnancy with grand multiparity, unspecified trimester: Secondary | ICD-10-CM

## 2019-09-21 DIAGNOSIS — O26893 Other specified pregnancy related conditions, third trimester: Secondary | ICD-10-CM

## 2019-09-21 DIAGNOSIS — O10913 Unspecified pre-existing hypertension complicating pregnancy, third trimester: Secondary | ICD-10-CM

## 2019-09-21 DIAGNOSIS — O0943 Supervision of pregnancy with grand multiparity, third trimester: Secondary | ICD-10-CM

## 2019-09-21 DIAGNOSIS — I5189 Other ill-defined heart diseases: Secondary | ICD-10-CM

## 2019-09-21 DIAGNOSIS — O360130 Maternal care for anti-D [Rh] antibodies, third trimester, not applicable or unspecified: Secondary | ICD-10-CM

## 2019-09-21 DIAGNOSIS — Z6791 Unspecified blood type, Rh negative: Secondary | ICD-10-CM

## 2019-09-21 DIAGNOSIS — O0990 Supervision of high risk pregnancy, unspecified, unspecified trimester: Secondary | ICD-10-CM

## 2019-09-21 DIAGNOSIS — R300 Dysuria: Secondary | ICD-10-CM

## 2019-09-21 MED ORDER — RHO D IMMUNE GLOBULIN 1500 UNIT/2ML IJ SOSY
300.0000 ug | PREFILLED_SYRINGE | Freq: Once | INTRAMUSCULAR | Status: AC
  Administered 2019-09-21: 15:00:00 300 ug via INTRAMUSCULAR

## 2019-09-21 NOTE — Progress Notes (Signed)
Pt states has pain in buttocks hips & legs on right side, gets numb.

## 2019-09-21 NOTE — Progress Notes (Signed)
Medicaid Home form completed-09/21/2019
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
## 2019-09-22 LAB — CBC
Hematocrit: 33.1 % — ABNORMAL LOW (ref 34.0–46.6)
Hemoglobin: 11.1 g/dL (ref 11.1–15.9)
MCH: 30.7 pg (ref 26.6–33.0)
MCHC: 33.5 g/dL (ref 31.5–35.7)
MCV: 91 fL (ref 79–97)
Platelets: 347 10*3/uL (ref 150–450)
RBC: 3.62 x10E6/uL — ABNORMAL LOW (ref 3.77–5.28)
RDW: 12.9 % (ref 11.7–15.4)
WBC: 6.9 10*3/uL (ref 3.4–10.8)

## 2019-09-22 LAB — RPR: RPR Ser Ql: NONREACTIVE

## 2019-09-22 LAB — HIV ANTIBODY (ROUTINE TESTING W REFLEX): HIV Screen 4th Generation wRfx: NONREACTIVE

## 2019-09-22 LAB — ANTIBODY SCREEN: Antibody Screen: NEGATIVE

## 2019-09-22 LAB — URINE CULTURE

## 2019-09-27 ENCOUNTER — Encounter: Payer: Self-pay | Admitting: Medical

## 2019-09-27 ENCOUNTER — Telehealth: Payer: Self-pay | Admitting: Medical

## 2019-09-27 ENCOUNTER — Other Ambulatory Visit: Payer: Medicaid Other

## 2019-09-27 NOTE — Telephone Encounter (Signed)
Called the patient in regards to missed appointment. Left a voicemail requesting a call back to our office. Also mailing the patient an missed appointment letter.

## 2019-09-29 LAB — CERVICOVAGINAL ANCILLARY ONLY
Bacterial Vaginitis (gardnerella): POSITIVE — AB
Candida Glabrata: NEGATIVE
Candida Vaginitis: NEGATIVE
Chlamydia: NEGATIVE
Comment: NEGATIVE
Comment: NEGATIVE
Comment: NEGATIVE
Comment: NEGATIVE
Comment: NEGATIVE
Comment: NORMAL
Neisseria Gonorrhea: NEGATIVE
Trichomonas: NEGATIVE

## 2019-09-30 MED ORDER — METRONIDAZOLE 500 MG PO TABS: 500.0000 mg | ORAL_TABLET | Freq: Two times a day (BID) | ORAL | 0 refills | Status: DC

## 2019-09-30 NOTE — Addendum Note (Signed)
Addended by: Vivien Rota on: 09/30/2019 07:37 PM   Modules accepted: Orders

## 2019-10-06 ENCOUNTER — Ambulatory Visit (HOSPITAL_COMMUNITY)
Admission: RE | Admit: 2019-10-06 | Discharge: 2019-10-06 | Disposition: A | Discharge status: Home / Self Care | Payer: Medicaid Other | Source: Ambulatory Visit | Attending: Obstetrics and Gynecology | Admitting: Obstetrics and Gynecology

## 2019-10-06 ENCOUNTER — Ambulatory Visit (HOSPITAL_COMMUNITY): Payer: Medicaid Other

## 2019-10-10 ENCOUNTER — Encounter: Payer: Medicaid Other | Admitting: Obstetrics and Gynecology

## 2019-10-10 ENCOUNTER — Encounter: Payer: Self-pay | Admitting: Obstetrics and Gynecology

## 2019-10-10 ENCOUNTER — Telehealth: Payer: Self-pay | Admitting: Obstetrics and Gynecology

## 2019-10-10 NOTE — Telephone Encounter (Signed)
Attempted to call patient to get her rescheduled for her missed ob appointment. No answer , left voicemail for patient to give the office a call back to be rescheduled. No show letter mailed °

## 2019-10-14 ENCOUNTER — Other Ambulatory Visit: Payer: Self-pay

## 2019-10-14 ENCOUNTER — Ambulatory Visit (HOSPITAL_COMMUNITY)
Admission: RE | Admit: 2019-10-14 | Discharge: 2019-10-14 | Disposition: A | Discharge status: Home / Self Care | Payer: Medicaid Other | Source: Ambulatory Visit | Attending: Obstetrics and Gynecology | Admitting: Obstetrics and Gynecology

## 2019-10-14 ENCOUNTER — Ambulatory Visit (HOSPITAL_COMMUNITY): Payer: Medicaid Other | Admitting: *Deleted

## 2019-10-14 ENCOUNTER — Encounter (HOSPITAL_COMMUNITY): Payer: Self-pay

## 2019-10-14 DIAGNOSIS — R079 Chest pain, unspecified: Secondary | ICD-10-CM

## 2019-10-14 DIAGNOSIS — O0943 Supervision of pregnancy with grand multiparity, third trimester: Secondary | ICD-10-CM

## 2019-10-14 DIAGNOSIS — Z6791 Unspecified blood type, Rh negative: Secondary | ICD-10-CM | POA: Insufficient documentation

## 2019-10-14 DIAGNOSIS — O10919 Unspecified pre-existing hypertension complicating pregnancy, unspecified trimester: Secondary | ICD-10-CM | POA: Diagnosis not present

## 2019-10-14 DIAGNOSIS — O09893 Supervision of other high risk pregnancies, third trimester: Secondary | ICD-10-CM

## 2019-10-14 DIAGNOSIS — Z362 Encounter for other antenatal screening follow-up: Secondary | ICD-10-CM

## 2019-10-14 DIAGNOSIS — O09213 Supervision of pregnancy with history of pre-term labor, third trimester: Secondary | ICD-10-CM | POA: Diagnosis not present

## 2019-10-14 DIAGNOSIS — O26893 Other specified pregnancy related conditions, third trimester: Secondary | ICD-10-CM | POA: Insufficient documentation

## 2019-10-14 DIAGNOSIS — O34219 Maternal care for unspecified type scar from previous cesarean delivery: Secondary | ICD-10-CM | POA: Insufficient documentation

## 2019-10-14 DIAGNOSIS — O360130 Maternal care for anti-D [Rh] antibodies, third trimester, not applicable or unspecified: Secondary | ICD-10-CM

## 2019-10-14 DIAGNOSIS — Z8679 Personal history of other diseases of the circulatory system: Secondary | ICD-10-CM | POA: Insufficient documentation

## 2019-10-14 DIAGNOSIS — O0990 Supervision of high risk pregnancy, unspecified, unspecified trimester: Secondary | ICD-10-CM | POA: Insufficient documentation

## 2019-10-14 DIAGNOSIS — Z3A32 32 weeks gestation of pregnancy: Secondary | ICD-10-CM

## 2019-10-14 DIAGNOSIS — O09293 Supervision of pregnancy with other poor reproductive or obstetric history, third trimester: Secondary | ICD-10-CM

## 2019-10-14 DIAGNOSIS — O99213 Obesity complicating pregnancy, third trimester: Secondary | ICD-10-CM

## 2019-10-14 DIAGNOSIS — O10013 Pre-existing essential hypertension complicating pregnancy, third trimester: Secondary | ICD-10-CM

## 2019-10-16 ENCOUNTER — Emergency Department (HOSPITAL_COMMUNITY): Payer: Medicaid Other

## 2019-10-16 ENCOUNTER — Emergency Department (HOSPITAL_BASED_OUTPATIENT_CLINIC_OR_DEPARTMENT_OTHER): Payer: Medicaid Other

## 2019-10-16 ENCOUNTER — Other Ambulatory Visit: Payer: Self-pay

## 2019-10-16 ENCOUNTER — Emergency Department (HOSPITAL_COMMUNITY)
Admission: EM | Admit: 2019-10-16 | Discharge: 2019-10-16 | Disposition: A | Discharge status: Home / Self Care | Payer: Medicaid Other | Attending: Emergency Medicine | Admitting: Emergency Medicine

## 2019-10-16 ENCOUNTER — Encounter (HOSPITAL_COMMUNITY): Payer: Self-pay | Admitting: Emergency Medicine

## 2019-10-16 DIAGNOSIS — Z79899 Other long term (current) drug therapy: Secondary | ICD-10-CM | POA: Insufficient documentation

## 2019-10-16 DIAGNOSIS — U071 COVID-19: Secondary | ICD-10-CM | POA: Insufficient documentation

## 2019-10-16 DIAGNOSIS — R05 Cough: Secondary | ICD-10-CM | POA: Insufficient documentation

## 2019-10-16 DIAGNOSIS — O10013 Pre-existing essential hypertension complicating pregnancy, third trimester: Secondary | ICD-10-CM | POA: Diagnosis not present

## 2019-10-16 DIAGNOSIS — R079 Chest pain, unspecified: Secondary | ICD-10-CM | POA: Diagnosis not present

## 2019-10-16 DIAGNOSIS — Z3A32 32 weeks gestation of pregnancy: Secondary | ICD-10-CM | POA: Diagnosis not present

## 2019-10-16 DIAGNOSIS — R430 Anosmia: Secondary | ICD-10-CM | POA: Diagnosis present

## 2019-10-16 DIAGNOSIS — Z7901 Long term (current) use of anticoagulants: Secondary | ICD-10-CM | POA: Insufficient documentation

## 2019-10-16 DIAGNOSIS — O98513 Other viral diseases complicating pregnancy, third trimester: Secondary | ICD-10-CM | POA: Diagnosis not present

## 2019-10-16 DIAGNOSIS — R0602 Shortness of breath: Secondary | ICD-10-CM

## 2019-10-16 LAB — TROPONIN I (HIGH SENSITIVITY)
Troponin I (High Sensitivity): 4 ng/L (ref <18)
Troponin I (High Sensitivity): 3 ng/L (ref <18)

## 2019-10-16 LAB — CBC
HCT: 34.8 % — ABNORMAL LOW (ref 36.0–46.0)
Hemoglobin: 11.3 g/dL — ABNORMAL LOW (ref 12.0–15.0)
MCH: 30.8 pg (ref 26.0–34.0)
MCHC: 32.5 g/dL (ref 30.0–36.0)
MCV: 94.8 fL (ref 80.0–100.0)
Platelets: 261 10*3/uL (ref 150–400)
RBC: 3.67 MIL/uL — ABNORMAL LOW (ref 3.87–5.11)
RDW: 13.7 % (ref 11.5–15.5)
WBC: 5.4 10*3/uL (ref 4.0–10.5)
nRBC: 0 % (ref 0.0–0.2)

## 2019-10-16 LAB — BASIC METABOLIC PANEL
Anion gap: 12 (ref 5–15)
BUN: 5 mg/dL — ABNORMAL LOW (ref 6–20)
CO2: 23 mmol/L (ref 22–32)
Calcium: 8.4 mg/dL — ABNORMAL LOW (ref 8.9–10.3)
Chloride: 102 mmol/L (ref 98–111)
Creatinine, Ser: 0.52 mg/dL (ref 0.44–1.00)
GFR calc Af Amer: >60 mL/min (ref >60)
GFR calc non Af Amer: >60 mL/min (ref >60)
Glucose, Bld: 86 mg/dL (ref 70–99)
Potassium: 3.3 mmol/L — ABNORMAL LOW (ref 3.5–5.1)
Sodium: 137 mmol/L (ref 135–145)

## 2019-10-16 LAB — SARS CORONAVIRUS 2 (TAT 6-24 HRS): SARS Coronavirus 2: POSITIVE — AB

## 2019-10-16 MED ORDER — ENOXAPARIN SODIUM 120 MG/0.8ML ~~LOC~~ SOLN
120.0000 mg | Freq: Two times a day (BID) | SUBCUTANEOUS | Status: DC
  Filled 2019-10-16: qty 0.8

## 2019-10-16 MED ORDER — SODIUM CHLORIDE 0.9% FLUSH: 3.0000 mL | Freq: Once | INTRAVENOUS | Status: DC

## 2019-10-16 MED ORDER — ENOXAPARIN SODIUM 120 MG/0.8ML ~~LOC~~ SOLN
120.0000 mg | Freq: Once | SUBCUTANEOUS | Status: AC
  Administered 2019-10-16: 120 mg via SUBCUTANEOUS
  Filled 2019-10-16: qty 0.8

## 2019-10-16 NOTE — ED Notes (Signed)
Patient O2 98% before ambulation. Patient O2 stayed at 98% during ambulation.

## 2019-10-16 NOTE — Progress Notes (Signed)
Spoke with Dr. Roselie Awkward. Pt is a G8 P7 at 32 4/[redacted] weeks gestation presenting with loss of smell, body aches since 10/10/2019. Says other family members have had symptoms. Pt gets her care at the [facility]. She has chronic hypertension, says she found out she has a clot in her heart in Sept. Pt has been on lovenox. Pt has a velamentous insertion of the cord and is a previous C/S. Chest xray and covid swab are pending. Pt is a febrile, 02 sats are normal, BP is normal. No vaginal bleeding or leaking of fluid. FHR baseline is 155 BPM, moderate variability, no accels, no decels. Pt is OB cleared and ED staff can call Dr. Roselie Awkward for any concerns at [phone].

## 2019-10-16 NOTE — ED Notes (Signed)
Rapid OB nurse at bedside

## 2019-10-16 NOTE — ED Notes (Signed)
Patient verbalizes understanding of discharge instructions. Opportunity for questioning and answers were provided. Armband removed by staff, pt discharged from ED.  

## 2019-10-16 NOTE — Progress Notes (Signed)
Pt is a G8 P7 at 32 4/[redacted] weeks gestation here because she is experiencing body aches, diarrhea, and loss of smell. Says the father of the baby has had covid symptoms since last week. She says hers started  10/10/2019. Pt says she gets her care at the [facility] across from [place] her baby has a marginal cord insertion. She says she has chronic hypertension.

## 2019-10-16 NOTE — Progress Notes (Signed)
Pt has a velamentous insertion of the cord. Pt is also a previous C/S. She saysshe found out in Sept that she has a clot in her heart. Says she has been on lovenox since then.

## 2019-10-16 NOTE — ED Notes (Signed)
C/o fatigue, no smell, has lost most of taste, states having trouble breathing -- pressure in midchest-- hurts to breath Also is [redacted] weeks pregnant-- 8th pregnancy-- goes to high risk clinic  States the baby's father has covid symptoms, was seen here for same last week-- they have NOT been separated in house, he has not been quarantined.

## 2019-10-16 NOTE — ED Triage Notes (Signed)
C/o loss of smell, fatigue, body aches, and diarrhea x 1 week.  Chest pressure since Thursday.   32 weeks 4 days pregnant.

## 2019-10-16 NOTE — Progress Notes (Addendum)
VASCULAR LAB PRELIMINARY  PRELIMINARY  PRELIMINARY  PRELIMINARY  Bilateral lower extremity venous duplex completed.    Preliminary report:  See CV proc for preliminary results.   Gave report to Nuala Alpha, PA-C  Cniyah Sproull, [facility], RVT 10/16/2019, 5:23 PM

## 2019-10-16 NOTE — ED Notes (Signed)
US at bedside

## 2019-10-16 NOTE — Discharge Instructions (Addendum)
You have been diagnosed today with COVID-19 virus infection.  At this time there does not appear to be the presence of an emergent medical condition, however there is always the potential for conditions to change. Please read and follow the below instructions.  Please return to the Emergency Department immediately for any new or worsening symptoms. Please be sure to follow up with your Primary Care Provider within one week regarding your visit today; please call their office to schedule an appointment even if you are feeling better for a follow-up visit. You have been diagnosed today with the COVID-19 virus, please call your primary care doctor's office tomorrow morning to schedule a follow-up televisit.  Return to the ER immediately for any new or worsening symptoms. Please continue to take your daily Lovenox as prescribed by your doctors. Call your OB/GYN's office first thing tomorrow morning to inform them of your diagnosis of COVID-19 virus.  Get help right away if: You have trouble breathing. You have pain or pressure in your chest. You have confusion. You have bluish lips and fingernails. You have difficulty waking from sleep. You have signs or symptoms of labor before 37 weeks of pregnancy. These include: Contractions that are 5 minutes or less apart, or that increase in frequency, intensity, or length. Sudden, sharp pain in the abdomen or in the lower back. A gush or trickle of fluid from your vagina. You have any new/concerning or worsening of symptoms.  Please read the additional information packets attached to your discharge summary.  Do not take your medicine if  develop an itchy rash, swelling in your mouth or lips, or difficulty breathing; call 911 and seek immediate emergency medical attention if this occurs.  Note: Portions of this text may have been transcribed using voice recognition software. Every effort was made to ensure accuracy; however, inadvertent computerized  transcription errors may still be present.
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
## 2019-10-17 ENCOUNTER — Other Ambulatory Visit (HOSPITAL_COMMUNITY): Payer: Self-pay | Admitting: *Deleted

## 2019-10-17 ENCOUNTER — Encounter: Payer: Self-pay | Admitting: *Deleted

## 2019-10-17 DIAGNOSIS — O10913 Unspecified pre-existing hypertension complicating pregnancy, third trimester: Secondary | ICD-10-CM

## 2019-10-19 ENCOUNTER — Encounter: Payer: Self-pay | Admitting: *Deleted

## 2019-10-20 ENCOUNTER — Ambulatory Visit: Payer: Medicaid Other | Admitting: Cardiovascular Disease

## 2019-10-21 ENCOUNTER — Encounter: Payer: Self-pay | Admitting: Medical

## 2019-10-21 DIAGNOSIS — U071 COVID-19: Secondary | ICD-10-CM

## 2019-10-21 HISTORY — DX: COVID-19: U07.1

## 2019-11-11 ENCOUNTER — Ambulatory Visit (HOSPITAL_COMMUNITY): Payer: Medicaid Other

## 2019-11-11 ENCOUNTER — Ambulatory Visit (HOSPITAL_COMMUNITY): Payer: Medicaid Other | Attending: Obstetrics and Gynecology

## 2019-11-11 ENCOUNTER — Encounter (HOSPITAL_COMMUNITY): Payer: Self-pay

## 2019-11-30 ENCOUNTER — Inpatient Hospital Stay (HOSPITAL_COMMUNITY): Payer: Medicaid Other

## 2019-11-30 ENCOUNTER — Encounter (HOSPITAL_COMMUNITY): Payer: Self-pay | Admitting: Obstetrics and Gynecology

## 2019-11-30 ENCOUNTER — Ambulatory Visit (HOSPITAL_COMMUNITY): Payer: Medicaid Other | Admitting: *Deleted

## 2019-11-30 ENCOUNTER — Ambulatory Visit (HOSPITAL_BASED_OUTPATIENT_CLINIC_OR_DEPARTMENT_OTHER)
Admission: RE | Admit: 2019-11-30 | Discharge: 2019-11-30 | Disposition: A | Discharge status: Home / Self Care | Payer: Medicaid Other | Source: Ambulatory Visit | Attending: Obstetrics and Gynecology | Admitting: Obstetrics and Gynecology

## 2019-11-30 ENCOUNTER — Encounter (HOSPITAL_COMMUNITY): Payer: Self-pay

## 2019-11-30 ENCOUNTER — Other Ambulatory Visit: Payer: Self-pay

## 2019-11-30 ENCOUNTER — Ambulatory Visit (INDEPENDENT_AMBULATORY_CARE_PROVIDER_SITE_OTHER): Payer: Medicaid Other | Admitting: Obstetrics & Gynecology

## 2019-11-30 ENCOUNTER — Inpatient Hospital Stay (HOSPITAL_COMMUNITY)
Admission: AD | Admit: 2019-11-30 | Discharge: 2019-12-05 | DRG: 806 | Disposition: A | Discharge status: Home / Self Care | Payer: Medicaid Other | Attending: Obstetrics & Gynecology | Admitting: Obstetrics & Gynecology

## 2019-11-30 DIAGNOSIS — Z3A39 39 weeks gestation of pregnancy: Secondary | ICD-10-CM

## 2019-11-30 DIAGNOSIS — O09893 Supervision of other high risk pregnancies, third trimester: Secondary | ICD-10-CM

## 2019-11-30 DIAGNOSIS — O0993 Supervision of high risk pregnancy, unspecified, third trimester: Secondary | ICD-10-CM

## 2019-11-30 DIAGNOSIS — O360130 Maternal care for anti-D [Rh] antibodies, third trimester, not applicable or unspecified: Secondary | ICD-10-CM

## 2019-11-30 DIAGNOSIS — O99213 Obesity complicating pregnancy, third trimester: Secondary | ICD-10-CM | POA: Diagnosis not present

## 2019-11-30 DIAGNOSIS — O10919 Unspecified pre-existing hypertension complicating pregnancy, unspecified trimester: Secondary | ICD-10-CM | POA: Insufficient documentation

## 2019-11-30 DIAGNOSIS — O10913 Unspecified pre-existing hypertension complicating pregnancy, third trimester: Secondary | ICD-10-CM

## 2019-11-30 DIAGNOSIS — O0940 Supervision of pregnancy with grand multiparity, unspecified trimester: Secondary | ICD-10-CM | POA: Diagnosis not present

## 2019-11-30 DIAGNOSIS — Z8616 Personal history of COVID-19: Secondary | ICD-10-CM | POA: Diagnosis present

## 2019-11-30 DIAGNOSIS — O0943 Supervision of pregnancy with grand multiparity, third trimester: Secondary | ICD-10-CM

## 2019-11-30 DIAGNOSIS — O10013 Pre-existing essential hypertension complicating pregnancy, third trimester: Secondary | ICD-10-CM | POA: Diagnosis not present

## 2019-11-30 DIAGNOSIS — O09293 Supervision of pregnancy with other poor reproductive or obstetric history, third trimester: Secondary | ICD-10-CM

## 2019-11-30 DIAGNOSIS — O0990 Supervision of high risk pregnancy, unspecified, unspecified trimester: Secondary | ICD-10-CM

## 2019-11-30 DIAGNOSIS — F70 Mild intellectual disabilities: Secondary | ICD-10-CM | POA: Diagnosis not present

## 2019-11-30 DIAGNOSIS — I361 Nonrheumatic tricuspid (valve) insufficiency: Secondary | ICD-10-CM | POA: Diagnosis not present

## 2019-11-30 DIAGNOSIS — O99824 Streptococcus B carrier state complicating childbirth: Secondary | ICD-10-CM | POA: Diagnosis present

## 2019-11-30 DIAGNOSIS — A6000 Herpesviral infection of urogenital system, unspecified: Secondary | ICD-10-CM | POA: Diagnosis present

## 2019-11-30 DIAGNOSIS — O99210 Obesity complicating pregnancy, unspecified trimester: Secondary | ICD-10-CM

## 2019-11-30 DIAGNOSIS — D151 Benign neoplasm of heart: Secondary | ICD-10-CM | POA: Diagnosis present

## 2019-11-30 DIAGNOSIS — Z8679 Personal history of other diseases of the circulatory system: Secondary | ICD-10-CM | POA: Insufficient documentation

## 2019-11-30 DIAGNOSIS — O1002 Pre-existing essential hypertension complicating childbirth: Secondary | ICD-10-CM | POA: Diagnosis present

## 2019-11-30 DIAGNOSIS — O43123 Velamentous insertion of umbilical cord, third trimester: Secondary | ICD-10-CM

## 2019-11-30 DIAGNOSIS — O09213 Supervision of pregnancy with history of pre-term labor, third trimester: Secondary | ICD-10-CM

## 2019-11-30 DIAGNOSIS — O09899 Supervision of other high risk pregnancies, unspecified trimester: Secondary | ICD-10-CM

## 2019-11-30 DIAGNOSIS — O26893 Other specified pregnancy related conditions, third trimester: Secondary | ICD-10-CM | POA: Diagnosis not present

## 2019-11-30 DIAGNOSIS — O9832 Other infections with a predominantly sexual mode of transmission complicating childbirth: Secondary | ICD-10-CM | POA: Diagnosis present

## 2019-11-30 DIAGNOSIS — Z6791 Unspecified blood type, Rh negative: Secondary | ICD-10-CM

## 2019-11-30 DIAGNOSIS — Z8659 Personal history of other mental and behavioral disorders: Secondary | ICD-10-CM

## 2019-11-30 DIAGNOSIS — O34219 Maternal care for unspecified type scar from previous cesarean delivery: Secondary | ICD-10-CM | POA: Diagnosis not present

## 2019-11-30 DIAGNOSIS — B951 Streptococcus, group B, as the cause of diseases classified elsewhere: Secondary | ICD-10-CM | POA: Diagnosis present

## 2019-11-30 DIAGNOSIS — R079 Chest pain, unspecified: Secondary | ICD-10-CM

## 2019-11-30 DIAGNOSIS — I517 Cardiomegaly: Secondary | ICD-10-CM | POA: Diagnosis not present

## 2019-11-30 DIAGNOSIS — I318 Other specified diseases of pericardium: Secondary | ICD-10-CM | POA: Diagnosis present

## 2019-11-30 DIAGNOSIS — O9902 Anemia complicating childbirth: Secondary | ICD-10-CM | POA: Diagnosis present

## 2019-11-30 DIAGNOSIS — O34211 Maternal care for low transverse scar from previous cesarean delivery: Secondary | ICD-10-CM | POA: Diagnosis not present

## 2019-11-30 DIAGNOSIS — I5189 Other ill-defined heart diseases: Secondary | ICD-10-CM | POA: Diagnosis not present

## 2019-11-30 DIAGNOSIS — D509 Iron deficiency anemia, unspecified: Secondary | ICD-10-CM | POA: Diagnosis present

## 2019-11-30 DIAGNOSIS — O26899 Other specified pregnancy related conditions, unspecified trimester: Secondary | ICD-10-CM

## 2019-11-30 DIAGNOSIS — O99214 Obesity complicating childbirth: Secondary | ICD-10-CM | POA: Diagnosis present

## 2019-11-30 HISTORY — DX: Post-traumatic stress disorder, unspecified: F43.10

## 2019-11-30 HISTORY — DX: Herpesviral infection, unspecified: B00.9

## 2019-11-30 HISTORY — DX: Other ill-defined heart diseases: I51.89

## 2019-11-30 LAB — URINALYSIS, ROUTINE W REFLEX MICROSCOPIC
Bilirubin Urine: NEGATIVE
Glucose, UA: NEGATIVE mg/dL
Hgb urine dipstick: NEGATIVE
Ketones, ur: 5 mg/dL — AB
Nitrite: NEGATIVE
Protein, ur: 30 mg/dL — AB
Specific Gravity, Urine: 1.019 (ref 1.005–1.030)
pH: 6 (ref 5.0–8.0)

## 2019-11-30 LAB — PROTEIN / CREATININE RATIO, URINE
Creatinine, Urine: 205.61 mg/dL
Protein Creatinine Ratio: 0.14 mg/mg{Cre} (ref 0.00–0.15)
Total Protein, Urine: 28 mg/dL

## 2019-11-30 LAB — TYPE AND SCREEN
ABO/RH(D): A NEG
Antibody Screen: NEGATIVE

## 2019-11-30 LAB — COMPREHENSIVE METABOLIC PANEL
ALT: 27 U/L (ref 0–44)
AST: 19 U/L (ref 15–41)
Albumin: 3.1 g/dL — ABNORMAL LOW (ref 3.5–5.0)
Alkaline Phosphatase: 118 U/L (ref 38–126)
Anion gap: 12 (ref 5–15)
BUN: 7 mg/dL (ref 6–20)
CO2: 21 mmol/L — ABNORMAL LOW (ref 22–32)
Calcium: 9.2 mg/dL (ref 8.9–10.3)
Chloride: 104 mmol/L (ref 98–111)
Creatinine, Ser: 0.47 mg/dL (ref 0.44–1.00)
GFR calc Af Amer: >60 mL/min (ref >60)
GFR calc non Af Amer: >60 mL/min (ref >60)
Glucose, Bld: 74 mg/dL (ref 70–99)
Potassium: 4 mmol/L (ref 3.5–5.1)
Sodium: 137 mmol/L (ref 135–145)
Total Bilirubin: 0.6 mg/dL (ref 0.3–1.2)
Total Protein: 6.9 g/dL (ref 6.5–8.1)

## 2019-11-30 LAB — CBC
HCT: 37.7 % (ref 36.0–46.0)
Hemoglobin: 12.1 g/dL (ref 12.0–15.0)
MCH: 29.7 pg (ref 26.0–34.0)
MCHC: 32.1 g/dL (ref 30.0–36.0)
MCV: 92.6 fL (ref 80.0–100.0)
Platelets: 358 10*3/uL (ref 150–400)
RBC: 4.07 MIL/uL (ref 3.87–5.11)
RDW: 14 % (ref 11.5–15.5)
WBC: 9.3 10*3/uL (ref 4.0–10.5)
nRBC: 0 % (ref 0.0–0.2)

## 2019-11-30 LAB — HEMOGLOBIN A1C
Hgb A1c MFr Bld: 4.6 % — ABNORMAL LOW (ref 4.8–5.6)
Mean Plasma Glucose: 85.32 mg/dL

## 2019-11-30 LAB — GLUCOSE, CAPILLARY: Glucose-Capillary: 140 mg/dL — ABNORMAL HIGH (ref 70–99)

## 2019-11-30 MED ORDER — LABETALOL HCL 5 MG/ML IV SOLN: 80.0000 mg | INTRAVENOUS | Status: DC | PRN

## 2019-11-30 MED ORDER — LABETALOL HCL 5 MG/ML IV SOLN
INTRAVENOUS | Status: AC
  Filled 2019-11-30: qty 4

## 2019-11-30 MED ORDER — VALACYCLOVIR HCL 500 MG PO TABS
1000.0000 mg | ORAL_TABLET | Freq: Every day | ORAL | Status: DC
  Administered 2019-12-01: 1000 mg via ORAL
  Filled 2019-11-30 (×2): qty 2

## 2019-11-30 MED ORDER — HYDRALAZINE HCL 20 MG/ML IJ SOLN: 10.0000 mg | INTRAMUSCULAR | Status: DC | PRN

## 2019-11-30 MED ORDER — ACETAMINOPHEN 325 MG PO TABS: 650.0000 mg | ORAL_TABLET | ORAL | Status: DC | PRN

## 2019-11-30 MED ORDER — CALCIUM CARBONATE ANTACID 500 MG PO CHEW: 2.0000 | CHEWABLE_TABLET | ORAL | Status: DC | PRN

## 2019-11-30 MED ORDER — LABETALOL HCL 200 MG PO TABS
200.0000 mg | ORAL_TABLET | Freq: Two times a day (BID) | ORAL | Status: DC
  Administered 2019-11-30: 200 mg via ORAL
  Filled 2019-11-30: qty 1

## 2019-11-30 MED ORDER — LACTATED RINGERS IV SOLN: INTRAVENOUS | Status: DC

## 2019-11-30 MED ORDER — LABETALOL HCL 5 MG/ML IV SOLN
20.0000 mg | INTRAVENOUS | Status: DC | PRN
  Administered 2019-11-30 – 2019-12-01 (×3): 20 mg via INTRAVENOUS
  Filled 2019-11-30 (×2): qty 4

## 2019-11-30 MED ORDER — ZOLPIDEM TARTRATE 5 MG PO TABS: 5.0000 mg | ORAL_TABLET | Freq: Every evening | ORAL | Status: DC | PRN

## 2019-11-30 MED ORDER — LABETALOL HCL 5 MG/ML IV SOLN: 40.0000 mg | INTRAVENOUS | Status: DC | PRN

## 2019-11-30 MED ORDER — LACTATED RINGERS IV BOLUS
1000.0000 mL | Freq: Once | INTRAVENOUS | Status: AC
  Administered 2019-11-30: 17:00:00 1000 mL via INTRAVENOUS

## 2019-11-30 MED ORDER — DOCUSATE SODIUM 100 MG PO CAPS: 100.0000 mg | ORAL_CAPSULE | Freq: Two times a day (BID) | ORAL | Status: DC | PRN

## 2019-11-30 NOTE — Progress Notes (Signed)
Dr Ilda Basset informs this RN that patient will be admitted to [facility] for echo and repeat BPP, pt to remain on monitor and stay NPO.
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
## 2019-11-30 NOTE — MAU Note (Signed)
.   [name] [name] is a 33 y.o. at [redacted]w[redacted]d here in MAU reporting: she was sent from office for NST. Denies any pain  Onset of complaint:  Pain score: 0 Vitals:   11/30/19 1633  BP: (!) 170/109  Pulse: 99  Resp: 16  SpO2: 99%     FHT:150 Lab orders placed from triage: UA
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
## 2019-11-30 NOTE — Plan of Care (Signed)
  Problem: Activity: Goal: Risk for activity intolerance will decrease Outcome: Completed/Met   Problem: Elimination: Goal: Will not experience complications related to bowel motility Outcome: Completed/Met Goal: Will not experience complications related to urinary retention Outcome: Completed/Met

## 2019-12-01 ENCOUNTER — Inpatient Hospital Stay (HOSPITAL_COMMUNITY): Payer: Medicaid Other | Admitting: Anesthesiology

## 2019-12-01 ENCOUNTER — Encounter (HOSPITAL_COMMUNITY): Payer: Self-pay | Admitting: Obstetrics and Gynecology

## 2019-12-01 ENCOUNTER — Other Ambulatory Visit: Payer: Self-pay

## 2019-12-01 ENCOUNTER — Inpatient Hospital Stay (HOSPITAL_COMMUNITY): Payer: Medicaid Other

## 2019-12-01 DIAGNOSIS — O1002 Pre-existing essential hypertension complicating childbirth: Secondary | ICD-10-CM | POA: Diagnosis not present

## 2019-12-01 DIAGNOSIS — B951 Streptococcus, group B, as the cause of diseases classified elsewhere: Secondary | ICD-10-CM | POA: Diagnosis present

## 2019-12-01 DIAGNOSIS — I5189 Other ill-defined heart diseases: Secondary | ICD-10-CM

## 2019-12-01 DIAGNOSIS — I361 Nonrheumatic tricuspid (valve) insufficiency: Secondary | ICD-10-CM

## 2019-12-01 LAB — CBC
HCT: 32.2 % — ABNORMAL LOW (ref 36.0–46.0)
Hemoglobin: 10.4 g/dL — ABNORMAL LOW (ref 12.0–15.0)
MCH: 30.3 pg (ref 26.0–34.0)
MCHC: 32.3 g/dL (ref 30.0–36.0)
MCV: 93.9 fL (ref 80.0–100.0)
Platelets: 318 10*3/uL (ref 150–400)
RBC: 3.43 MIL/uL — ABNORMAL LOW (ref 3.87–5.11)
RDW: 14.3 % (ref 11.5–15.5)
WBC: 7.9 10*3/uL (ref 4.0–10.5)
nRBC: 0 % (ref 0.0–0.2)

## 2019-12-01 LAB — GLUCOSE, CAPILLARY
Glucose-Capillary: 107 mg/dL — ABNORMAL HIGH (ref 70–99)
Glucose-Capillary: 84 mg/dL (ref 70–99)
Glucose-Capillary: 82 mg/dL (ref 70–99)
Glucose-Capillary: 71 mg/dL (ref 70–99)

## 2019-12-01 LAB — ECHOCARDIOGRAM COMPLETE
Height: 62 in
Weight: 4000.03 oz

## 2019-12-01 LAB — GROUP B STREP BY PCR: Group B strep by PCR: POSITIVE — AB

## 2019-12-01 MED ORDER — PHENYLEPHRINE 40 MCG/ML (10ML) SYRINGE FOR IV PUSH (FOR BLOOD PRESSURE SUPPORT)
80.0000 ug | PREFILLED_SYRINGE | INTRAVENOUS | Status: DC | PRN
  Filled 2019-12-01: qty 10

## 2019-12-01 MED ORDER — FENTANYL-BUPIVACAINE-NACL 0.5-0.125-0.9 MG/250ML-% EP SOLN
12.0000 mL/h | EPIDURAL | Status: DC | PRN
  Filled 2019-12-01: qty 250

## 2019-12-01 MED ORDER — LACTATED RINGERS IV SOLN: INTRAVENOUS | Status: DC

## 2019-12-01 MED ORDER — TERBUTALINE SULFATE 1 MG/ML IJ SOLN: 0.2500 mg | Freq: Once | INTRAMUSCULAR | Status: DC | PRN

## 2019-12-01 MED ORDER — LACTATED RINGERS IV SOLN: 500.0000 mL | Freq: Once | INTRAVENOUS | Status: DC

## 2019-12-01 MED ORDER — PHENYLEPHRINE 40 MCG/ML (10ML) SYRINGE FOR IV PUSH (FOR BLOOD PRESSURE SUPPORT): 80.0000 ug | PREFILLED_SYRINGE | INTRAVENOUS | Status: DC | PRN

## 2019-12-01 MED ORDER — SODIUM CHLORIDE (PF) 0.9 % IJ SOLN
INTRAMUSCULAR | Status: DC | PRN
  Administered 2019-12-01: 12 mL/h via EPIDURAL

## 2019-12-01 MED ORDER — LIDOCAINE HCL (PF) 1 % IJ SOLN
INTRAMUSCULAR | Status: DC | PRN
  Administered 2019-12-01: 10 mL via EPIDURAL
  Administered 2019-12-01: 2 mL via EPIDURAL

## 2019-12-01 MED ORDER — OXYTOCIN 40 UNITS IN NORMAL SALINE INFUSION - SIMPLE MED
1.0000 m[IU]/min | INTRAVENOUS | Status: DC
  Administered 2019-12-01: 14:00:00 1 m[IU]/min via INTRAVENOUS

## 2019-12-01 MED ORDER — NALBUPHINE HCL 10 MG/ML IJ SOLN
5.0000 mg | INTRAMUSCULAR | Status: AC | PRN
  Administered 2019-12-01 (×2): 5 mg via INTRAVENOUS
  Filled 2019-12-01 (×2): qty 1

## 2019-12-01 MED ORDER — OXYTOCIN 40 UNITS IN NORMAL SALINE INFUSION - SIMPLE MED
INTRAVENOUS | Status: AC
  Filled 2019-12-01: qty 1000

## 2019-12-01 MED ORDER — DIPHENHYDRAMINE HCL 50 MG/ML IJ SOLN
12.5000 mg | INTRAMUSCULAR | Status: DC | PRN
  Administered 2019-12-01 (×2): 12.5 mg via INTRAVENOUS
  Filled 2019-12-01: qty 1

## 2019-12-01 MED ORDER — LABETALOL HCL 100 MG PO TABS
100.0000 mg | ORAL_TABLET | Freq: Two times a day (BID) | ORAL | Status: DC
  Administered 2019-12-01 – 2019-12-04 (×7): 100 mg via ORAL
  Filled 2019-12-01 (×7): qty 1

## 2019-12-01 MED ORDER — EPHEDRINE 5 MG/ML INJ: 10.0000 mg | INTRAVENOUS | Status: DC | PRN

## 2019-12-01 MED ORDER — SODIUM CHLORIDE 0.9 % IV SOLN
5.0000 10*6.[IU] | Freq: Once | INTRAVENOUS | Status: AC
  Administered 2019-12-01: 5 10*6.[IU] via INTRAVENOUS
  Filled 2019-12-01: qty 5

## 2019-12-01 MED ORDER — PENICILLIN G POT IN DEXTROSE 60000 UNIT/ML IV SOLN
3.0000 10*6.[IU] | INTRAVENOUS | Status: DC
  Administered 2019-12-01 – 2019-12-02 (×2): 3 10*6.[IU] via INTRAVENOUS
  Filled 2019-12-01 (×4): qty 50

## 2019-12-01 NOTE — Progress Notes (Signed)
Labor Progress Note [name] [name] is a 33 y.o. [id number] at [redacted]w[redacted]d presented for [facility] from [facility]. S: Comfortable with epidural.   O:  BP (!) 158/90   Pulse 96   Temp 98.2 F (36.8 C) (Oral)   Resp 20   Ht 5\' 2"  (1.575 m)   Wt 113.4 kg   LMP 03/02/2019   SpO2 99%   BMI 45.73 kg/m  EFM: 140, moderate variability, pos accels, no decels, reactive TOCO: q52m  CVE: Dilation: 3.5 Effacement (%): 50 Cervical Position: Middle Station: -2 Presentation: Vertex Exam by:: Dr. Marice Potter    A&P: 33 y.o. [id number] [redacted]w[redacted]d here for TOLAC. IOL. #Labor: Progressing well. 2 prior c-sections, declines repeat C-section. Cont Pit titration. AROM with clear fluid at 2300. Plan for passive second stage considering right atrial mass; see Cards note from earlier today. Anticipate SVD. #Pain: Epidural #FWB: Cat I #GBS positive; PCN since 1558 #cHTN: Labetalol 100 mg BID; Pre-E labs negative; few severe-range BP's but recently mild-range  Chauncey Mann, MD 11:11 PM
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
## 2019-12-01 NOTE — Progress Notes (Signed)
  Echocardiogram 2D Echocardiogram has been performed.  [name] [name] 12/01/2019, 8:33 AM

## 2019-12-01 NOTE — Progress Notes (Signed)
Patient ID: [name] [name], female   DOB: 1986/04/15, 33 y.o.   MRN: [id number] Patient declines repeat cesarean section and wants TOLAC. Se will induce with FB if needed, AROM and pitocin if needed  Faculty Practice OB/GYN Attending Consult Note  33 y.o. [id number] at [redacted]w[redacted]d with Estimated Date of Delivery: 12/07/19 was seen today in office to discuss trial of labor after cesarean section (TOLAC) versus elective repeat cesarean delivery (ERCD). The following risks were discussed with the patient.  Risk of uterine rupture at term is 0.78 percent with TOLAC and 0.22 percent with ERCD. 1 in 10 uterine ruptures will result in neonatal death or neurological injury. The benefits of a trial of labor after cesarean (TOLAC) resulting in a vaginal birth after cesarean (VBAC) include the following: shorter length of hospital stay and postpartum recovery (in most cases); fewer complications, such as postpartum fever, wound or uterine infection, thromboembolism (blood clots in the leg or lung), need for blood transfusion and fewer neonatal breathing problems. The risks of an attempted VBAC or TOLAC include the following: . Risk of failed trial of labor after cesarean (TOLAC) without a vaginal birth after cesarean (VBAC) resulting in repeat cesarean delivery (RCD) in about 20 to 75 percent of women who attempt VBAC.  Marland Kitchen Risk of rupture of uterus resulting in an emergency cesarean delivery. The risk of uterine rupture may be related in part to the type of uterine incision made during the first cesarean delivery. A previous transverse uterine incision has the lowest risk of rupture (0.2 to 1.5 percent risk). Vertical or T-shaped uterine incisions have a higher risk of uterine rupture (4 to 9 percent risk)The risk of fetal death is very low with both VBAC and elective repeat cesarean delivery (ERCD), but the likelihood of fetal death is higher with VBAC than with ERCD. Maternal death is very rare with either type of delivery. The  risks of an elective repeat cesarean delivery (ERCD) were reviewed with the patient including but not limited to: 01/999 risk of uterine rupture which could have serious consequences, bleeding which may require transfusion; infection which may require antibiotics; injury to bowel, bladder or other surrounding organs (bowel, bladder, ureters); injury to the fetus; need for additional procedures including hysterectomy in the event of a life-threatening hemorrhage; thromboembolic phenomenon; abnormal placentation; incisional problems; death and other postoperative or anesthesia complications.     Woodroe Mode, MD [place], [facility] for [facility], [place]

## 2019-12-01 NOTE — Progress Notes (Signed)
Labor Progress Note [name] [name] is a 33 y.o. [id number] at [redacted]w[redacted]d presented for [facility] from [facility]. S: Comfortable with epidural. Difficult to engage with.  O:  BP (!) 154/88   Pulse 89   Temp 98.2 F (36.8 C) (Oral)   Resp 20   Ht 5\' 2"  (1.575 m)   Wt 113.4 kg   LMP 03/02/2019   SpO2 99%   BMI 45.73 kg/m  EFM: 135, moderate variability, pos accels, no decels, reactive TOCO: q54m  CVE: Dilation: 3.5 Effacement (%): 50 Cervical Position: Middle Station: -2 Presentation: Vertex Exam by:: Dr. Marice Potter    A&P: 33 y.o. [id number] [redacted]w[redacted]d here for TOLAC. IOL. #Labor: Progressing well. 2 prior c-sections, declines repeat C-section. Cont Pit titration. Will consider AROM at next check. Plan for passive second stage considering right atrial mass; see Cards note from earlier today. Anticipate SVD. #Pain: Epidural #FWB: Cat I #GBS positive; PCN since 1558 #cHTN: Labetalol 100 mg BID; Pre-E labs negative; few severe-range BP's but recently mild-range  Chauncey Mann, MD 8:19 PM

## 2019-12-01 NOTE — Anesthesia Procedure Notes (Signed)
Epidural Patient location during procedure: OB Start time: 12/01/2019 3:03 PM End time: 12/01/2019 3:15 PM  Staffing Anesthesiologist: Pervis Hocking, DO Performed: anesthesiologist   Preanesthetic Checklist Completed: patient identified, IV checked, risks and benefits discussed, monitors and equipment checked, pre-op evaluation and timeout performed  Epidural Patient position: sitting Prep: DuraPrep and site prepped and draped Patient monitoring: continuous pulse ox, blood pressure, heart rate and cardiac monitor Approach: midline Location: L3-L4 Injection technique: LOR air  Needle:  Needle type: Tuohy  Needle gauge: 17 G Needle length: 9 cm Needle insertion depth: 7.5 cm Catheter type: closed end flexible Catheter size: 19 Gauge Catheter at skin depth: 13 cm Test dose: negative  Assessment Sensory level: T8 Events: blood not aspirated, injection not painful, no injection resistance, no paresthesia and negative IV test  Additional Notes Patient identified. Risks/Benefits/Options discussed with patient including but not limited to bleeding, infection, nerve damage, paralysis, failed block, incomplete pain control, headache, blood pressure changes, nausea, vomiting, reactions to medication both or allergic, itching and postpartum back pain. Confirmed with bedside nurse the patient's most recent platelet count. Confirmed with patient that they are not currently taking any anticoagulation, have any bleeding history or any family history of bleeding disorders. Patient expressed understanding and wished to proceed. All questions were answered. Sterile technique was used throughout the entire procedure. Please see nursing notes for vital signs. Test dose was given through epidural catheter and negative prior to continuing to dose epidural or start infusion. Warning signs of high block given to the patient including shortness of breath, tingling/numbness in hands, complete motor  block, or any concerning symptoms with instructions to call for help. Patient was given instructions on fall risk and not to get out of bed. All questions and concerns addressed with instructions to call with any issues or inadequate analgesia.  Reason for block:procedure for pain
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
## 2019-12-01 NOTE — Consult Note (Addendum)
Cardiology Consultation:   Patient ID: [name] [name]; [id number]; Dec 30, 1985   Admit date: 11/30/2019 Date of Consult: 12/01/2019  Primary Care Provider: Kathrene Alu, MD Primary Cardiologist: New   Patient Profile:   [name] [name] is a 34 y.o. female with a hx of MR with known 1.5cm RA mass per echocardiogram dx 08/26/2019 treated with Lovenox, [redacted] week gestation pregnancy and previous COVID positive 10/16/2019 and HTN who is being seen today for the evaluation of right atrial mass at the request of Dr. Ilda Basset.   History of Present Illness:   [name] [name] is a 33yo F with a hx as stated above who was admitted per OBGYN service after being seen in the OP setting noted to be her first OB appointment since 09/24/2019 at which time there was  non-reassuring fetal movements and therefore was recommended for prompt delivery. Patient was initially found to have RA mass per echocardiogram 08/2019. Lovenox therapy was initiated. Patient reported compliance with Lovenox until just before being dx with COVID in ealry November and has since stopped secondary to it making her "feel funny".     Dr. Ellyn Hack was called initially for recommendations 11/30/2019, and recommended repeat echocardiogram prior to c-section delivery. Echocardiogram performed today which showed moderately sized mobile right atrial mass in the right atrial cavity, EF of 55-60% and G1DD felt to have no significant change from prior study.   Spoke to the patient today regarding her known right atrial mass.  We discussed the hemodynamic stress of labor and delivery.  She is extremely keen to have a vaginal birth.  She recalls being told that she had the right atrial mass and took until she was diagnosed with Covid in early November due to difficulty taking the medication as noted above.  She feels that her home life is far more stressful than the stress of labor and delivery.  She has had no symptoms of increased shortness of  breath, no chest pain, no palpitations.  Her main symptom is pelvic pressure.  She is [redacted] weeks gestation.  Past Medical History:  Diagnosis Date   Abnormal Pap smear    f/u was normal   Chronic hypertension    DEPRESSION, MAJOR, RECURRENT, MODERATE 04/18/2010   Qualifier: Diagnosis of  By: Georgina Snell MD, Darnelle Spangle)    History of abnormal Pap smear 07/28/2012   History of preterm delivery 11/04/2018   Infection    trich, chlamydia, gonorrhea   Ovarian cyst    Urinary tract infection     Past Surgical History:  Procedure Laterality Date   CESAREAN SECTION     CESAREAN SECTION N/A 09/23/2018   Procedure: CESAREAN SECTION;  Surgeon: Mora Bellman, MD;  Location: [facility];  Service: Obstetrics;  Laterality: N/A;   TONSILLECTOMY     WISDOM TOOTH EXTRACTION       Prior to Admission medications   Medication Sig Start Date End Date Taking? Authorizing Provider  AMBULATORY NON FORMULARY MEDICATION 1 Device by Other route once a week. Blood Pressure Cuff/ Medium Monitored Regularly at home ICD 10: O09.90 06/01/19   Anyanwu, Sallyanne Havers, MD  aspirin EC 81 MG tablet Take 1 tablet (81 mg total) by mouth daily. Take after 12 weeks for prevention of preeclampsia later in pregnancy Patient not taking: Reported on 10/14/2019 06/01/19   Chancy Milroy, MD  Blood Pressure Monitoring (BLOOD PRESSURE KIT) DEVI 1 Device by Does not apply route as needed. ICD 10:  O09.90  Patient not taking: Reported on 07/15/2019 07/01/19   Lavonia Drafts, MD  Butalbital-APAP-Caffeine [phone] MG capsule Take 1 capsule by mouth every 6 (six) hours as needed for headache. Patient not taking: Reported on 07/15/2019 05/15/18   Jorje Guild, NP  diphenhydramine-acetaminophen (TYLENOL PM) 25-500 MG TABS tablet Take 1 tablet by mouth at bedtime as needed.    [provider]  docusate sodium (COLACE) 100 MG capsule Take 1 capsule (100 mg total) by mouth 2 (two) times daily. Patient  not taking: Reported on 07/14/2019 05/17/19   Anyanwu, Sallyanne Havers, MD  enoxaparin (LOVENOX) 120 MG/0.8ML injection Inject 0.8 mLs (120 mg total) into the skin every 12 (twelve) hours. 08/26/19 11/25/19  Donnamae Jude, MD  metroNIDAZOLE (FLAGYL) 500 MG tablet Take 1 tablet (500 mg total) by mouth 2 (two) times daily. 09/30/19   Sloan Leiter, MD  ondansetron (ZOFRAN ODT) 4 MG disintegrating tablet Take 1 tablet (4 mg total) by mouth every 6 (six) hours as needed for nausea. Patient not taking: Reported on 07/14/2019 06/01/19   Chancy Milroy, MD  polyethylene glycol (MIRALAX) 17 g packet Take 17 g by mouth daily. Patient not taking: Reported on 07/14/2019 05/17/19   Anyanwu, Sallyanne Havers, MD  Prenatal Vit-Fe Fumarate-FA (PREPLUS) 27-1 MG TABS Take 1 tablet by mouth daily. Patient not taking: Reported on 07/14/2019 06/01/19   Chancy Milroy, MD  promethazine (PHENERGAN) 25 MG tablet Take 1 tablet (25 mg total) by mouth every 6 (six) hours as needed for nausea or vomiting. Patient not taking: Reported on 09/21/2019 08/29/19   Donnamae Jude, MD  valACYclovir (VALTREX) 1000 MG tablet Take 1 tablet (1,000 mg total) by mouth daily. Patient not taking: Reported on 10/20/2018 09/08/18   Aletha Halim, MD    Inpatient Medications: Scheduled Meds:  labetalol  100 mg Oral BID   valACYclovir  1,000 mg Oral Daily   Continuous Infusions:  lactated ringers 125 mL/hr at 12/01/19 0907   PRN Meds: acetaminophen, calcium carbonate, docusate sodium, labetalol **AND** labetalol **AND** labetalol **AND** hydrALAZINE **AND** Measure blood pressure, zolpidem  Allergies:   No Known Allergies  Social History:   Social History   Socioeconomic History   Marital status: Single    Spouse name: Not on file   Number of children: 4   Years of education: 12   Highest education level: Not on file  Occupational History    Employer: Agricultural engineer  Tobacco Use   Smoking status: Never Smoker   Smokeless tobacco:  Never Used  Substance and Sexual Activity   Alcohol use: Not Currently    Alcohol/week: 2.0 standard drinks    Types: 1 Glasses of wine, 1 Shots of liquor per week    Comment: Not currently. Last use May 2020   Drug use: Not Currently    Types: Marijuana    Comment: Not currently. Last use 2017   Sexual activity: Yes    Birth control/protection: None  Other Topics Concern   Not on file  Social History Narrative   Lives at home with four children   Drinks soda occasionally    Social Determinants of Health   Financial Resource Strain:    Difficulty of Paying Living Expenses: Not on file  Food Insecurity: No Food Insecurity   Worried About Running Out of Food in the Last Year: Never true   Ran Out of Food in the Last Year: Never true  Transportation Needs: No Transportation Needs   Lack of Transportation (Medical):  No   Lack of Transportation (Non-Medical): No  Physical Activity:    Days of Exercise per Week: Not on file   Minutes of Exercise per Session: Not on file  Stress:    Feeling of Stress : Not on file  Social Connections:    Frequency of Communication with Friends and Family: Not on file   Frequency of Social Gatherings with Friends and Family: Not on file   Attends Religious Services: Not on file   Active Member of Clubs or Organizations: Not on file   Attends Archivist Meetings: Not on file   Marital Status: Not on file  Intimate Partner Violence: Not At Risk   Fear of Current or Ex-Partner: No   Emotionally Abused: No   Physically Abused: No   Sexually Abused: No    Family History:   Family History  Problem Relation Age of Onset   Hypertension Mother    Arthritis Mother    Hypertension Father    Asthma Brother    Cancer Maternal Uncle        lung   Diabetes Maternal Grandmother    Cancer Maternal Grandmother        thyroid   Hypertension Maternal Grandmother    Other Neg Hx    Family Status:  Family Status   Relation Name Status   Mother  Alive   Father  Alive   Brother  Alive   Mat Uncle  (Not Specified)   MGM  (Not Specified)   Neg Hx  (Not Specified)    ROS:  Please see the history of present illness.  All other ROS reviewed and negative.     Physical Exam/Data:   Vitals:   11/30/19 1917 11/30/19 2255 12/01/19 0423 12/01/19 0844  BP: (!) 151/86 125/63 (!) 113/47 (!) 151/73  Pulse: 88 96 96 87  Resp: _0 Temp: 98.4 F (36.9 C) 98.7 F (37.1 C) 98.3 F (36.8 C) 98.2 F (36.8 C)  TempSrc: Oral Oral Oral Oral  SpO2:  99% 100% 100%  Weight:      Height:       No intake or output data in the 24 hours ending 12/01/19 1033 Filed Weights   11/30/19 1633 11/30/19 1848  Weight: 113.4 kg 113.4 kg   Body mass index is 45.73 kg/m.   General:  Well nourished, well developed, in no acute distress HEENT: normal Neck: no JVD Vascular: No carotid bruits; 4/4 extremity pulses 2+, without bruits  Cardiac:  normal S1, S2; regular rhythm and tachycardic; no murmur  Lungs:  clear to auscultation bilaterally, no wheezing, rhonchi or rales  Abd: Gravid abdomen Ext: Trace bilateral edema Musculoskeletal:  No deformities, BUE and BLE strength normal and equal Skin: warm and dry  Neuro:  CNs 2-12 intact, no focal abnormalities noted Psych:  Normal affect   EKG: No ECG performed this admission  Relevant CV Studies:  ECHO 12/01/2019:  Left ventricular ejection fraction, by visual estimation, is 55 to 60%. The left ventricle has normal function. There is mildly increased left ventricular hypertrophy. 2. Left ventricular diastolic parameters are consistent with Grade I diastolic dysfunction (impaired relaxation). 3. The left ventricle has no regional wall motion abnormalities. 4. Global right ventricle has normal systolic function.The right ventricular size is normal. No increase in right ventricular wall thickness. 5. Left atrial size was normal. 6. Moderately sized  mobile right atrial mass in the right atrial cavity. 7. Right atrial size was normal. 8.  The mitral valve is abnormal. Trivial mitral valve regurgitation. 9. The tricuspid valve is grossly normal. 10. The aortic valve is tricuspid. Aortic valve regurgitation is not visualized. 11. The pulmonic valve was grossly normal. Pulmonic valve regurgitation is not visualized. 12. Normal pulmonary artery systolic pressure. 13. The inferior vena cava is normal in size with greater than 50% respiratory variability, suggesting right atrial pressure of 3 mmHg. 14. A prior study was performed on 08/26/19. 15. No significant change from prior study.  Echocardiogram 08/25/2019:  Left ventricular ejection fraction, by visual estimation, is 50 to 55%. The left ventricle has normal function. Normal left ventricular size. There is no left ventricular hypertrophy. 2. Global right ventricle has normal systolic function.The right ventricular size is normal. 3. Left atrial size was normal. 4. Right atrial size was normal. 5. The mitral valve is normal in structure. Trace mitral valve regurgitation. No evidence of mitral stenosis. 6. The tricuspid valve is normal in structure. Tricuspid valve regurgitation is trivial. 7. The aortic valve is normal in structure. Aortic valve regurgitation was not visualized by color flow Doppler. Structurally normal aortic valve, with no evidence of sclerosis or stenosis. 8. The pulmonic valve was normal in structure. Pulmonic valve regurgitation is trivial by color flow Doppler. 9. Normal pulmonary artery systolic pressure. 10. The inferior vena cava is normal in size with greater than 50% respiratory variability, suggesting right atrial pressure of 3 mmHg. 11. Normal LV function; 1.5 x 1.2 cm mass in right atrium of uncertain etiology (possible myxoma or thrombus).  Laboratory Data:  Chemistry Recent Labs  Lab 11/30/19 1650  NA 137  K 4.0  CL 104  CO2 21*  GLUCOSE 74    BUN 7  CREATININE 0.47  CALCIUM 9.2  GFRNONAA >60  GFRAA >60  ANIONGAP 12    Total Protein  Date Value Ref Range Status  11/30/2019 6.9 6.5 - 8.1 g/dL Final  06/01/2019 7.2 6.0 - 8.5 g/dL Final   Albumin  Date Value Ref Range Status  11/30/2019 3.1 (L) 3.5 - 5.0 g/dL Final  06/01/2019 4.1 3.8 - 4.8 g/dL Final   AST  Date Value Ref Range Status  11/30/2019 19 15 - 41 U/L Final   ALT  Date Value Ref Range Status  11/30/2019 27 0 - 44 U/L Final   Alkaline Phosphatase  Date Value Ref Range Status  11/30/2019 118 38 - 126 U/L Final   Total Bilirubin  Date Value Ref Range Status  11/30/2019 0.6 0.3 - 1.2 mg/dL Final   Bilirubin Total  Date Value Ref Range Status  06/01/2019 <0.2 0.0 - 1.2 mg/dL Final   Hematology Recent Labs  Lab 11/30/19 1650  WBC 9.3  RBC 4.07  HGB 12.1  HCT 37.7  MCV 92.6  MCH 29.7  MCHC 32.1  RDW 14.0  PLT 358   Cardiac EnzymesNo results for input(s): TROPONINI in the last 168 hours. No results for input(s): TROPIPOC in the last 168 hours.  BNPNo results for input(s): BNP, PROBNP in the last 168 hours.  DDimer No results for input(s): DDIMER in the last 168 hours. TSH:  Lab Results  Component Value Date   TSH 0.645 06/01/2019   Lipids:No results found for: CHOL, HDL, LDLCALC, LDLDIRECT, TRIG, CHOLHDL HgbA1c: Lab Results  Component Value Date   HGBA1C 4.6 (L) 11/30/2019    Radiology/Studies:  [place] MFM FETAL BPP WO NON STRESS  Result Date: 12/01/2019 ----------------------------------------------------------------------  OBSTETRICS REPORT                        (  Signed Final 12/01/2019 09:55 am) ---------------------------------------------------------------------- Patient Info  ID #:       [id number]                          D.O.B.:  Jun 11, 1986 (33 yrs)  Name:       Wyatt Portela Gaumond                 Visit Date: 11/30/2019 08:25 pm ---------------------------------------------------------------------- Performed By  Performed By:     Felecia Jan        Referred By:       L[place]  Attending:        Sander Nephew      [facility]                                        [place] ---------------------------------------------------------------------- Orders   #  Description                          Code         Ordered By   1  [place] MFM FETAL BPP WO NON              19417.40     CHARLIE DuPage  ----------------------------------------------------------------------   #  Order #                    Accession #                 Episode #   1  [id number]                  [id number]                  [id number]  ---------------------------------------------------------------------- Indications   [redacted] weeks gestation of pregnancy                Z3A.39   Hypertension - Chronic/Pre-existing            O10.019   Poor obstetric history: Previous preterm       O09.219   delivery, antepartum (Twins _0 )   History of cesarean delivery, currently        O59.219   pregnant (x2)   Rh negative state in antepartum                O36.0190   Poor obstetrical history (PPROM)               O11.299   Grand multiparity, antepartum                  A12.87   Obesity complicating pregnancy, third          O99.213   trimester   Short interval between pregancies, 2nd  F81.829   trimester  ---------------------------------------------------------------------- Vital Signs                                                 Height:        5'2" ---------------------------------------------------------------------- Fetal Evaluation  Num Of Fetuses:          1  Fetal Heart Rate(bpm):   148  Cardiac Activity:        Observed  Presentation:            Cephalic  Amniotic Fluid  AFI FV:      Within normal limits  AFI Sum(cm)     %Tile       Largest Pocket(cm)  14.71           59          5.18  RUQ(cm)       RLQ(cm)       LUQ(cm)        LLQ(cm)  5.07           1.85          2.61           5.18 ---------------------------------------------------------------------- Biophysical Evaluation  Amniotic F.V:   Within normal limits       F. Tone:         Observed  F. Movement:    Observed                   Score:           8/8  F. Breathing:   Observed ---------------------------------------------------------------------- OB History  Gravidity:    7         Term:   5        Prem:   1  Living:       7 ---------------------------------------------------------------------- Gestational Age  LMP:           15w 0d        Date:  03/02/19                 EDD:   12/07/19  Best:          39w 0d     Det. By:  LMP  (03/02/19)          EDD:   12/07/19 ---------------------------------------------------------------------- Anatomy  Stomach:               Appears normal, left   Bladder:                Appears normal                         sided  Kidneys:               Appear normal ---------------------------------------------------------------------- Impression  Biophysical profile 8/8  Inpatient with prior equivocal antenatal testing  Known chronic hypertension requiring medication  Obesity and short interval testing  Velamentous cord insertion ---------------------------------------------------------------------- Recommendations  Given term preganancy I agree with the plan for admission  and delivery. ----------------------------------------------------------------------               Sander Nephew, MD Electronically Signed Final Report   12/01/2019 09:55 am ----------------------------------------------------------------------  [place] MFM FETAL BPP WO NON STRESS  Result Date: 11/30/2019 ----------------------------------------------------------------------  OBSTETRICS REPORT                       (  Signed Final 11/30/2019 03:36 pm) ---------------------------------------------------------------------- Patient Info  ID #:       [id number]                          D.O.B.:  August 31, 1986 (33 yrs)   Name:       Wyatt Portela Askin                 Visit Date: 11/30/2019 02:01 pm ---------------------------------------------------------------------- Performed By  Performed By:     Novella Rob        Referred By:      Massena  Attending:        Tama High MD        [facility] ---------------------------------------------------------------------- Orders   #  Description                          Code         Ordered By   1  [place] MFM FETAL BPP WO NON              76819.01     RAVI [facility]      STRESS  ----------------------------------------------------------------------   #  Order #                    Accession #                 Episode #   1  [id number]                  [id number]                  [id number]  ---------------------------------------------------------------------- Indications   Hypertension - Chronic/Pre-existing            O10.019   Poor obstetric history: Previous preterm       O09.219   delivery, antepartum (Twins _0 )   History of cesarean delivery, currently        O39.219   pregnant (x2)   Rh negative state in antepartum                O36.0190   Poor obstetrical history (PPROM)               O40.299   Grand multiparity, antepartum                  J33.54   Obesity complicating pregnancy, second         O99.212   trimester   Short interval between pregancies, 2nd         O09.892   trimester   Encounter for other antenatal screening  Z36.2   follow-up   [redacted] weeks gestation of pregnancy                Z3A.39  ---------------------------------------------------------------------- Vital Signs  Weight (lb): 259                               Height:        5'2"  BMI:         47.37 ---------------------------------------------------------------------- Fetal Evaluation  Num Of Fetuses:         1  Fetal Heart Rate(bpm):  145   Cardiac Activity:       Observed  Presentation:           Cephalic  Placenta:               Posterior  P. Cord Insertion:      Previously Visualized  Amniotic Fluid  AFI FV:      Within normal limits  AFI Sum(cm)     %Tile       Largest Pocket(cm)  9.54            24          3.82  RUQ(cm)       RLQ(cm)       LUQ(cm)        LLQ(cm)  3.82          2.15          3.57           0 ---------------------------------------------------------------------- Biophysical Evaluation  Amniotic F.V:   Within normal limits       F. Tone:        Not Observed  F. Movement:    Observed                   Score:          4/8  F. Breathing:   Not Observed ---------------------------------------------------------------------- OB History  Gravidity:    7         Term:   5        Prem:   1  Living:       7 ---------------------------------------------------------------------- Gestational Age  LMP:           9w 0d        Date:  03/02/19                 EDD:   12/07/19  Best:          39w 0d     Det. By:  LMP  (03/02/19)          EDD:   12/07/19 ---------------------------------------------------------------------- Anatomy  Thoracic:              Appears normal         Kidneys:                Appear normal  Stomach:               Appears normal, left   Bladder:                Appears normal                         sided ---------------------------------------------------------------------- Cervix Uterus Adnexa  Cervix  Not visualized (advanced GA >24wks) ---------------------------------------------------------------------- Impression  Patient with chronic hypertension and inadequate prenatal  care return for  fetal growth assessment and antenatal testing.  She had 2 previous cesarean deliveries most recent one was  in 2019 (twin pregnancy).  Her blood pressure today at our office were 148/99 and  152/89 mmHg.  She is not taking any antihypertensives.  On ultrasound, fetal growth is appropriate for gestational age.  Amniotic fluid is normal  and good fetal activity seen.  Fetal  movements and fetal tone did not meet the criteria BPP.  BPP score: 4/8.  I informed the patient that her gestational age is 25 weeks  and she has chronic hypertension and a poor biophysical  profile.  All these placed the fetus at risk of stillbirth and I  recommended delivery now.  Patient was reluctant to be induced.  After ultrasound, Dr. Hulan Fray canceled her.  Patient was still undecided about delivery but agreed to go to  the [facility] for NST monitoring and counseling by the obstetric  team. ----------------------------------------------------------------------                  Tama High, MD Electronically Signed Final Report   11/30/2019 03:36 pm ----------------------------------------------------------------------  ECHOCARDIOGRAM COMPLETE  Result Date: 12/01/2019   ECHOCARDIOGRAM REPORT   Patient Name:   RAWAN RIENDEAU Date of Exam: 12/01/2019 Medical Rec #:  [id number]     Height:       62.0 in Accession #:    [id number]    Weight:       250.0 lb Date of Birth:  1986-01-13     BSA:          2.10 m Patient Age:    33 years      BP:           113/47 mmHg Patient Gender: F             HR:           89 bpm. Exam Location:  Inpatient Procedure: 2D Echo, Cardiac Doppler and Color Doppler Indications:    Right atrial mass.; F00.712 Encounter for other preprocedural                 examination  History:        Patient has prior history of Echocardiogram examinations, most                 recent 08/26/2019. Abnormal ECG, Signs/Symptoms:Syncope and                 Altered Mental Status; Risk Factors:Hypertension. RA mass.  Sonographer:    Roseanna Rainbow RDCS Referring Phys: [id number] [facility]  Sonographer Comments: Suboptimal apical window, suboptimal subcostal window and patient is morbidly obese. Image acquisition challenging due to patient body habitus. Patient is pregnant and in third trimester. IMPRESSIONS  1. Left ventricular ejection fraction, by visual estimation, is 55 to  60%. The left ventricle has normal function. There is mildly increased left ventricular hypertrophy.  2. Left ventricular diastolic parameters are consistent with Grade I diastolic dysfunction (impaired relaxation).  3. The left ventricle has no regional wall motion abnormalities.  4. Global right ventricle has normal systolic function.The right ventricular size is normal. No increase in right ventricular wall thickness.  5. Left atrial size was normal.  6. Moderately sized mobile right atrial mass in the right atrial cavity.  7. Right atrial size was normal.  8. The mitral valve is abnormal. Trivial mitral valve regurgitation.  9. The tricuspid valve is grossly normal. 10. The aortic valve is tricuspid. Aortic valve regurgitation is  not visualized. 11. The pulmonic valve was grossly normal. Pulmonic valve regurgitation is not visualized. 12. Normal pulmonary artery systolic pressure. 13. The inferior vena cava is normal in size with greater than 50% respiratory variability, suggesting right atrial pressure of 3 mmHg. 14. A prior study was performed on 08/26/19. 15. No significant change from prior study. FINDINGS  Left Ventricle: Left ventricular ejection fraction, by visual estimation, is 55 to 60%. The left ventricle has normal function. The left ventricle has no regional wall motion abnormalities. There is mildly increased left ventricular hypertrophy. Left ventricular diastolic parameters are consistent with Grade I diastolic dysfunction (impaired relaxation). Indeterminate filling pressures. Right Ventricle: The right ventricular size is normal. No increase in right ventricular wall thickness. Global RV systolic function is has normal systolic function. The tricuspid regurgitant velocity is 2.15 m/s, and with an assumed right atrial pressure  of 3 mmHg, the estimated right ventricular systolic pressure is normal at 21.5 mmHg. Left Atrium: Left atrial size was normal in size. Right Atrium: Right atrial size was  normal in size There is a moderately sized mobile right atrial mass in the right atrial cavity. This mass is spherical in shape. This mass is solid in texture. 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 The mass measures approximately 18 cm by 10 cm. Pericardium: There is no evidence of pericardial effusion. Mitral Valve: The mitral valve is abnormal. There is mild thickening of the mitral valve leaflet(s). Trivial mitral valve regurgitation. Tricuspid Valve: The tricuspid valve is grossly normal. Tricuspid valve regurgitation is mild. Aortic Valve: The aortic valve is tricuspid. Aortic valve regurgitation is not visualized. Pulmonic Valve: The pulmonic valve was grossly normal. Pulmonic valve regurgitation is not visualized. Pulmonic regurgitation is not visualized. Aorta: The aortic root and ascending aorta are structurally normal, with no evidence of dilitation. Venous: The inferior vena cava is normal in size with greater than 50% respiratory variability, suggesting right atrial pressure of 3 mmHg. IAS/Shunts: No atrial level shunt detected by color flow Doppler. Additional Comments: A prior study was performed on 08/26/19.  LEFT VENTRICLE PLAX 2D LVIDd:         4.80 cm       Diastology LVIDs:         3.50 cm       LV e' lateral:   10.80 cm/s LV PW:         1.40 cm       LV E/e' lateral: 10.3 LV IVS:        1.10 cm       LV e' medial:    7.18 cm/s LVOT diam:     2.20 cm       LV E/e' medial:  15.5 LV SV:         57 ml LV SV Index:   24.67 LVOT Area:     3.80 cm  LV Volumes (MOD) LV area d, A2C:    32.50 cm LV area d, A4C:    34.60 cm LV area s, A2C:    20.60 cm LV area s, A4C:    20.70 cm LV major d, A2C:   7.29 cm LV major d, A4C:   7.41 cm LV major s, A2C:   6.27 cm LV major s, A4C:   6.55 cm LV vol d, MOD A2C: 122.0 ml LV vol d, MOD A4C: 132.0 ml LV vol s, MOD A2C: 57.7 ml LV vol s, MOD A4C: 55.4 ml LV SV MOD A2C:  64.3 ml LV SV MOD A4C:     132.0 ml LV SV MOD BP:      69.9 ml RIGHT VENTRICLE             IVC  RV S prime:     12.00 cm/s  IVC diam: 1.90 cm TAPSE (M-mode): 2.3 cm LEFT ATRIUM             Index       RIGHT ATRIUM           Index LA diam:        4.30 cm 2.05 cm/m  RA Area:     10.40 cm LA Vol (A2C):   57.5 ml 27.36 ml/m RA Volume:   22.60 ml  10.75 ml/m LA Vol (A4C):   63.0 ml 29.97 ml/m LA Biplane Vol: 61.7 ml 29.35 ml/m  AORTIC VALVE              PULMONIC VALVE LVOT Vmax:   220.00 cm/s  PR End Diast Vel: 1.80 msec LVOT Vmean:  161.000 cm/s LVOT VTI:    0.384 m  AORTA Ao Root diam: 2.80 cm Ao Asc diam:  2.50 cm MITRAL VALVE                         TRICUSPID VALVE MV Area (PHT): 3.72 cm              TR Peak grad:   18.5 mmHg MV PHT:        59.16 msec            TR Vmax:        215.00 cm/s MV Decel Time: 204 msec MV E velocity: 111.00 cm/s 103 cm/s  SHUNTS MV A velocity: 118.00 cm/s 70.3 cm/s Systemic VTI:  0.38 m MV E/A ratio:  0.94        1.5       Systemic Diam: 2.20 cm  Lyman Bishop MD Electronically signed by Lyman Bishop MD Signature Date/Time: 12/01/2019/9:03:36 AM    Final    Assessment and Plan:   1. Right atrial mass: I independently reviewed the echocardiogram images from today's echo as well as the echo from 08-26-2019.  There is a highly mobile relatively homogenous appearing right atrial mass.  Attachment point appears to be at the caval junction, and I am unable to comment whether the mass has any caval extension.  Differential diagnosis includes right atrial myxoma with atypical location, papillary fibroelastoma with atypical location, thrombus, tumor thrombus with caval extension though this is less likely.  It seems that she took Lovenox for a period of time, without significant change in the appearance of the right atrial mass.  Despite this I cannot entirely exclude thrombus.  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  There is no evidence of PFO on either echocardiogram, though TEE would be helpful to better visualize this.  This is  reassuring in that hopefully she will not the possibility of paradoxical emboli, however the stress of labor and delivery does not entirely exclude this possibility.  She has been admitted for labor and delivery and I have discussed this with anesthesia on-call who will be monitoring the patient and administering her epidural.  We discussed that reducing hemodynamic stressors may be in the patient's best interest and anesthesia has discussed with the patient early application of epidural anesthesia to minimize distress.  The likelihood of embolization of this mass  is unknown, and it is possible that platelet rich clot that is adherent to either a myxoma or PFE could cause right-sided pulmonary embolic events.  I discussed with anesthesia if the patient demonstrates any hemodynamic compromise or hemodynamic collapse, we should consider an embolic event and visualize the right atrial mass as well as consider cardioembolic PE as etiology and involve cardiothoracic surgery.  I discussed this with the patient as well so that she has an understanding of our cardiovascular monitoring.  I am hopeful the patient will have an uneventful labor and delivery, after which if the patient remains in the hospital we can perform a TEE to completely visualize the lesion.  I would recommend consultation of TCTS for evaluation of removal of the right atrial lesion and plan for pathology assessment of the mass once excised.  TCTS consultation can occur after the patient has delivered.  We will remain on board for assistance as needed.  Other hospital problems include: -severe pre-eclampsia -pregnancy with plan for c-section delivery    For questions or updates, please contact [facility] Please consult [email or web address] for contact info under Cardiology/STEMI.   Signed, Elouise Munroe 12/01/19

## 2019-12-02 ENCOUNTER — Encounter (HOSPITAL_COMMUNITY): Payer: Self-pay | Admitting: Obstetrics & Gynecology

## 2019-12-02 DIAGNOSIS — Z3A39 39 weeks gestation of pregnancy: Secondary | ICD-10-CM

## 2019-12-02 DIAGNOSIS — F70 Mild intellectual disabilities: Secondary | ICD-10-CM

## 2019-12-02 DIAGNOSIS — O34219 Maternal care for unspecified type scar from previous cesarean delivery: Secondary | ICD-10-CM | POA: Diagnosis not present

## 2019-12-02 DIAGNOSIS — O43123 Velamentous insertion of umbilical cord, third trimester: Secondary | ICD-10-CM

## 2019-12-02 DIAGNOSIS — O99210 Obesity complicating pregnancy, unspecified trimester: Secondary | ICD-10-CM

## 2019-12-02 DIAGNOSIS — O0940 Supervision of pregnancy with grand multiparity, unspecified trimester: Secondary | ICD-10-CM

## 2019-12-02 DIAGNOSIS — O34211 Maternal care for low transverse scar from previous cesarean delivery: Secondary | ICD-10-CM

## 2019-12-02 DIAGNOSIS — O10919 Unspecified pre-existing hypertension complicating pregnancy, unspecified trimester: Secondary | ICD-10-CM

## 2019-12-02 DIAGNOSIS — O1002 Pre-existing essential hypertension complicating childbirth: Secondary | ICD-10-CM

## 2019-12-02 LAB — CBC WITH DIFFERENTIAL/PLATELET
Abs Immature Granulocytes: 0.02 10*3/uL (ref 0.00–0.07)
Basophils Absolute: 0 10*3/uL (ref 0.0–0.1)
Basophils Relative: 0 %
Eosinophils Absolute: 0.1 10*3/uL (ref 0.0–0.5)
Eosinophils Relative: 1 %
HCT: 30.3 % — ABNORMAL LOW (ref 36.0–46.0)
Hemoglobin: 10 g/dL — ABNORMAL LOW (ref 12.0–15.0)
Immature Granulocytes: 0 %
Lymphocytes Relative: 33 %
Lymphs Abs: 3.1 10*3/uL (ref 0.7–4.0)
MCH: 30.3 pg (ref 26.0–34.0)
MCHC: 33 g/dL (ref 30.0–36.0)
MCV: 91.8 fL (ref 80.0–100.0)
Monocytes Absolute: 0.5 10*3/uL (ref 0.1–1.0)
Monocytes Relative: 5 %
Neutro Abs: 5.7 10*3/uL (ref 1.7–7.7)
Neutrophils Relative %: 61 %
Platelets: 284 10*3/uL (ref 150–400)
RBC: 3.3 MIL/uL — ABNORMAL LOW (ref 3.87–5.11)
RDW: 14.2 % (ref 11.5–15.5)
WBC: 9.4 10*3/uL (ref 4.0–10.5)
nRBC: 0 % (ref 0.0–0.2)

## 2019-12-02 LAB — GLUCOSE, CAPILLARY: Glucose-Capillary: 66 mg/dL — ABNORMAL LOW (ref 70–99)

## 2019-12-02 MED ORDER — MEASLES, MUMPS & RUBELLA VAC IJ SOLR: 0.5000 mL | Freq: Once | INTRAMUSCULAR | Status: DC

## 2019-12-02 MED ORDER — ENOXAPARIN SODIUM 120 MG/0.8ML ~~LOC~~ SOLN: 120.0000 mg | Freq: Two times a day (BID) | SUBCUTANEOUS | Status: DC

## 2019-12-02 MED ORDER — OXYTOCIN 40 UNITS IN NORMAL SALINE INFUSION - SIMPLE MED: 2.5000 [IU]/h | INTRAVENOUS | Status: DC

## 2019-12-02 MED ORDER — OXYTOCIN BOLUS FROM INFUSION
500.0000 mL | Freq: Once | INTRAVENOUS | Status: AC
  Administered 2019-12-02: 500 mL via INTRAVENOUS

## 2019-12-02 MED ORDER — TETANUS-DIPHTH-ACELL PERTUSSIS 5-2.5-18.5 LF-MCG/0.5 IM SUSP: 0.5000 mL | Freq: Once | INTRAMUSCULAR | Status: DC

## 2019-12-02 MED ORDER — ACETAMINOPHEN 325 MG PO TABS
650.0000 mg | ORAL_TABLET | Freq: Four times a day (QID) | ORAL | Status: DC | PRN
  Administered 2019-12-02 – 2019-12-05 (×6): 650 mg via ORAL
  Filled 2019-12-02 (×6): qty 2

## 2019-12-02 MED ORDER — MEDROXYPROGESTERONE ACETATE 150 MG/ML IM SUSP
150.0000 mg | INTRAMUSCULAR | Status: DC
  Administered 2019-12-04: 150 mg via INTRAMUSCULAR
  Filled 2019-12-02: qty 1

## 2019-12-02 MED ORDER — DIBUCAINE (PERIANAL) 1 % EX OINT: 1.0000 "application " | TOPICAL_OINTMENT | CUTANEOUS | Status: DC | PRN

## 2019-12-02 MED ORDER — DIPHENHYDRAMINE HCL 25 MG PO CAPS: 25.0000 mg | ORAL_CAPSULE | Freq: Four times a day (QID) | ORAL | Status: DC | PRN

## 2019-12-02 MED ORDER — ONDANSETRON HCL 4 MG/2ML IJ SOLN: 4.0000 mg | INTRAMUSCULAR | Status: DC | PRN

## 2019-12-02 MED ORDER — SENNOSIDES-DOCUSATE SODIUM 8.6-50 MG PO TABS
2.0000 | ORAL_TABLET | ORAL | Status: DC
  Administered 2019-12-02 – 2019-12-04 (×3): 2 via ORAL
  Filled 2019-12-02 (×3): qty 2

## 2019-12-02 MED ORDER — IBUPROFEN 600 MG PO TABS
600.0000 mg | ORAL_TABLET | Freq: Three times a day (TID) | ORAL | Status: DC | PRN
  Administered 2019-12-02 – 2019-12-05 (×10): 600 mg via ORAL
  Filled 2019-12-02 (×11): qty 1

## 2019-12-02 MED ORDER — WITCH HAZEL-GLYCERIN EX PADS: 1.0000 "application " | MEDICATED_PAD | CUTANEOUS | Status: DC | PRN

## 2019-12-02 MED ORDER — BENZOCAINE-MENTHOL 20-0.5 % EX AERO: 1.0000 "application " | INHALATION_SPRAY | CUTANEOUS | Status: DC | PRN

## 2019-12-02 MED ORDER — PRENATAL MULTIVITAMIN CH
1.0000 | ORAL_TABLET | Freq: Every day | ORAL | Status: DC
  Administered 2019-12-02 – 2019-12-04 (×3): 1 via ORAL
  Filled 2019-12-02 (×3): qty 1

## 2019-12-02 MED ORDER — ONDANSETRON HCL 4 MG PO TABS: 4.0000 mg | ORAL_TABLET | ORAL | Status: DC | PRN

## 2019-12-02 MED ORDER — SIMETHICONE 80 MG PO CHEW
80.0000 mg | CHEWABLE_TABLET | ORAL | Status: DC | PRN
  Administered 2019-12-04: 80 mg via ORAL
  Filled 2019-12-02: qty 1

## 2019-12-02 MED ORDER — COCONUT OIL OIL: 1.0000 "application " | TOPICAL_OIL | Status: DC | PRN

## 2019-12-02 NOTE — Anesthesia Postprocedure Evaluation (Signed)
Anesthesia Post Note  Patient: [name] [name]  Procedure(s) Performed: AN AD [place]     Patient location during evaluation: Mother Baby Anesthesia Type: Epidural Level of consciousness: awake Pain management: satisfactory to patient Vital Signs Assessment: post-procedure vital signs reviewed and stable Respiratory status: spontaneous breathing Cardiovascular status: stable Anesthetic complications: no    Last Vitals:  Vitals:   12/02/19 0830 12/02/19 1320  BP: (!) 146/82 (!) 142/77  Pulse: 99 92  Resp: 20 17  Temp: 37.4 C 37.5 C  SpO2: 99%     Last Pain:  Vitals:   12/02/19 1705  TempSrc:   PainSc: 7    Pain Goal: Patients Stated Pain Goal: 2 (11/30/19 1932)                 Casimer Lanius

## 2019-12-02 NOTE — L&D Delivery Note (Signed)
OB/GYN Faculty Practice Delivery Note  [name] [name] is a 34 y.o. [id number] s/p VBAC at [redacted]w[redacted]d. She was admitted for IOL secondary to right atrial mass.   ROM: 2h 20m with clear fluid GBS Status: --/POSITIVE (12/30 2119) Maximum Maternal Temperature: 98.46F  Labor Progress: . Initial SVE: 3/50/-3. Patient was started on Pitocin. Received epidural. AROM performed. She then progressed to complete.   Delivery Date/Time: 12/02/2019 @ 0151 Delivery: Called to room due to recurrent deep decels. Right side of cervix reduced. With one push infant head delivered in LOA position. No nuchal cord present. Shoulder and body delivered in usual fashion. Infant with spontaneous cry, placed on mother's abdomen, dried and stimulated. Cord clamped x 2 after 1-minute delay, and cut by MOB. Cord blood drawn. Placenta delivered spontaneously with gentle cord traction. Fundus firm with massage and Pitocin. Labia, perineum, vagina, and cervix inspected inspected with no lacerations.  Baby Weight: pending  Placenta: Sent to L&D Complications: None Lacerations: None EBL: 83 mL Analgesia: Epidural   Infant: APGAR (1 MIN): 8  APGAR (5 MINS): 9  APGAR (10 MINS):     Barrington Ellison, MD OB Family Medicine Fellow, [facility] for [facility] 12/02/2019, 2:10 AM

## 2019-12-02 NOTE — Discharge Summary (Addendum)
Postpartum Discharge Summary     Patient Name: [name] [name] DOB: 1986-11-30 MRN: [id number]  Date of admission: 11/30/2019 Delivering Provider: Chauncey Mann   Date of discharge: 12/05/2019  Admitting diagnosis: Supervision of high risk pregnancy in third trimester [O09.93] Labor and delivery, indication for care [O75.9] Intrauterine pregnancy: [redacted]w[redacted]d    Secondary diagnosis:  Active Problems:   Maternal morbid obesity, antepartum ([place]   MENTAL RETARDATION, MILD   History of depression   Rh negative status during pregnancy   Chronic hypertension in pregnancy   Previous cesarean delivery, antepartum   Short interval between pregnancies complicating pregnancy, antepartum   Grand multiparity with antenatal problem, antepartum   Right atrial mass   Supervision of high risk pregnancy in third trimester   Positive GBS test   Labor and delivery, indication for care   VBAC (vaginal birth after Cesarean)  Additional problems: Iron deficiency anemia     Discharge diagnosis: Term Pregnancy Delivered, VBAC and CHTN                                                                                                Post partum procedures:Depo PP   Augmentation: AROM and Pitocin  Complications: None  Hospital course:  Induction of Labor With Vaginal Delivery   34y.o. yo [id number] admitted to the hospital 11/30/2019 for induction of labor.  Indication for induction: right atrial mass.  Patient had an uncomplicated labor course as follows: Initial SVE: 3/50/-3. Patient was started on Pitocin. Received epidural. AROM performed. She then progressed to complete with uncomplicated delivery; delivered in one push. Membrane Rupture Time/Date: 11:00 PM ,12/01/2019   Intrapartum Procedures: Episiotomy: None [1]                                         Lacerations:  None [1]  Patient had delivery of a Viable infant.  Information for the patient's newborn:  EvViveka, Wilmeth0[[id number]] Delivery Method: VBAC, Spontaneous(Filed from Delivery Summary)    12/02/2019  Details of delivery can be found in separate delivery note. Postpartum, patient continued on Labetalol 100 mg BID and BP's continue to elevated - switched to Norvasc on 12/05/19. SW consulted. Depo given prior to discharge. Cardiology consulted for right atrial mass; Echo done. CTS consulted but opted to schedule outpatient f/u which was arranged. Patient is discharged home 12/05/19. Follow up in 1 week for BP check in office.  Delivery time: 1:51 AM    Magnesium Sulfate received: No BMZ received: No Rhophylac:No MMR:No Transfusion:No  Physical exam  Vitals:   12/05/19 1227 12/05/19 1240 12/05/19 1247 12/05/19 1330  BP: 116/74 109/66 127/82 129/80  Pulse: (!) 112 (!) 104 (!) 101 93  Resp: 20 (!) 25 19   Temp: 98.7 F (37.1 C)   98.3 F (36.8 C)  TempSrc: Oral     SpO2: 96% 97% 98% 99%  Weight:      Height:       General:  alert, cooperative and no distress Lochia: appropriate Uterine Fundus: firm Incision: N/A DVT Evaluation: No evidence of DVT seen on physical exam. No significant calf/ankle edema. Labs: Lab Results  Component Value Date   WBC 9.4 12/02/2019   HGB 10.0 (L) 12/02/2019   HCT 30.3 (L) 12/02/2019   MCV 91.8 12/02/2019   PLT 284 12/02/2019   CMP Latest Ref Rng & Units 11/30/2019  Glucose 70 - 99 mg/dL 74  BUN 6 - 20 mg/dL 7  Creatinine 0.44 - 1.00 mg/dL 0.47  Sodium 135 - 145 mmol/L 137  Potassium 3.5 - 5.1 mmol/L 4.0  Chloride 98 - 111 mmol/L 104  CO2 22 - 32 mmol/L 21(L)  Calcium 8.9 - 10.3 mg/dL 9.2  Total Protein 6.5 - 8.1 g/dL 6.9  Total Bilirubin 0.3 - 1.2 mg/dL 0.6  Alkaline Phos 38 - 126 U/L 118  AST 15 - 41 U/L 19  ALT 0 - 44 U/L 27    Discharge instruction: per After Visit Summary and "Baby and Me Booklet".  After visit meds:  Allergies as of 12/05/2019   No Known Allergies     Medication List    STOP taking these medications   aspirin EC 81 MG tablet    metroNIDAZOLE 500 MG tablet Commonly known as: FLAGYL     TAKE these medications   AMBULATORY NON FORMULARY MEDICATION 1 Device by Other route once a week. Blood Pressure Cuff/ Medium Monitored Regularly at home ICD 10: O09.90   amLODipine 5 MG tablet Commonly known as: NORVASC Take 1 tablet (5 mg total) by mouth daily.   Blood Pressure Kit Devi 1 Device by Does not apply route as needed. ICD 10:  O09.90   Butalbital-APAP-Caffeine 50-325-40 MG capsule Take 1 capsule by mouth every 6 (six) hours as needed for headache.   diphenhydramine-acetaminophen 25-500 MG Tabs tablet Commonly known as: TYLENOL PM Take 1 tablet by mouth at bedtime as needed.   docusate sodium 100 MG capsule Commonly known as: Colace Take 1 capsule (100 mg total) by mouth 2 (two) times daily.   enoxaparin 120 MG/0.8ML injection Commonly known as: Lovenox Inject 0.8 mLs (120 mg total) into the skin every 12 (twelve) hours.   ferrous sulfate 325 (65 FE) MG tablet Take 1 tablet (325 mg total) by mouth daily.   ibuprofen 600 MG tablet Commonly known as: ADVIL Take 1 tablet (600 mg total) by mouth every 8 (eight) hours as needed for mild pain.   ondansetron 4 MG disintegrating tablet Commonly known as: Zofran ODT Take 1 tablet (4 mg total) by mouth every 6 (six) hours as needed for nausea.   polyethylene glycol 17 g packet Commonly known as: MiraLax Take 17 g by mouth daily.   PrePLUS 27-1 MG Tabs Take 1 tablet by mouth daily.   promethazine 25 MG tablet Commonly known as: PHENERGAN Take 1 tablet (25 mg total) by mouth every 6 (six) hours as needed for nausea or vomiting.   valACYclovir 1000 MG tablet Commonly known as: VALTREX Take 1 tablet (1,000 mg total) by mouth daily.       Diet: routine diet  Activity: Advance as tolerated. Pelvic rest for 6 weeks.   Outpatient follow up:4 weeks Follow up Appt: No future appointments. Follow up Visit: [place] for  [facility] Follow up.   Specialty: Obstetrics and Gynecology Why: Make appointment to be seen in 1 week for blood pressure check and 4 weeks for postpartum appointment  Contact information: [address] [place], [place] [id number] [place] [address] [phone]           Please schedule this patient for Postpartum visit in: 4 weeks with the following provider: MD High risk pregnancy complicated by: Right atrial mass, mental retardation, TOLAC Delivery mode:  SVD Anticipated Birth Control:  Depo PP Procedures needed: BP check  Schedule Integrated BH visit: no   Newborn Data: Live born female  Birth Weight: 3505g (7lb 11.6oz) APGAR: 8, 9  Newborn Delivery   Birth date/time: 12/02/2019 01:51:00 Delivery type: VBAC, Spontaneous      Baby Feeding: Breast Disposition:rooming in   12/05/2019 Chauncey Mann, MD

## 2019-12-02 NOTE — Progress Notes (Addendum)
Progress Note  Patient Name: [name] [name] Date of Encounter: 12/02/2019  Primary Cardiologist: Skeet Latch, MD   Subjective   Feeling well.  No chest pain or shortness of breath.  Expected discomfort from vaginal delivery.  Inpatient Medications    Scheduled Meds: . labetalol  100 mg Oral BID  . [START ON 12/03/2019] measles, mumps & rubella vaccine  0.5 mL Subcutaneous Once  . [START ON 12/03/2019] medroxyPROGESTERone  150 mg Intramuscular Q90 days  . prenatal multivitamin  1 tablet Oral Q1200  . [START ON 12/03/2019] senna-docusate  2 tablet Oral Q24H  . [START ON 12/03/2019] Tdap  0.5 mL Intramuscular Once   Continuous Infusions:  PRN Meds: acetaminophen, benzocaine-Menthol, coconut oil, witch hazel-glycerin **AND** dibucaine, diphenhydrAMINE, ibuprofen, ondansetron **OR** ondansetron (ZOFRAN) IV, simethicone   Vital Signs    Vitals:   12/02/19 0231 12/02/19 0302 12/02/19 0345 12/02/19 0445  BP: (!) 154/96  (!) 142/79 (!) 142/80  Pulse: 88  80 (!) 102  Resp:  20 20 19   Temp:   99.5 F (37.5 C) 99.3 F (37.4 C)  TempSrc:   Oral Oral  SpO2:      Weight:      Height:        Intake/Output Summary (Last 24 hours) at 12/02/2019 0947 Last data filed at 12/02/2019 0302 Gross per 24 hour  Intake --  Output 1253 ml  Net -1253 ml   Last 3 Weights 11/30/2019 11/30/2019 09/21/2019  Weight (lbs) 250 lb 250 lb 259 lb 14.4 oz  Weight (kg) 113.4 kg 113.399 kg 117.89 kg      Telemetry    n/a - Personally Reviewed  ECG    N/a - Personally Reviewed  Physical Exam   VS:  BP (!) 142/80 (BP Location: Right Arm)   Pulse (!) 102   Temp 99.3 F (37.4 C) (Oral)   Resp 19   Ht 5\' 2"  (1.575 m)   Wt 113.4 kg   LMP 03/02/2019   SpO2 99%   Breastfeeding Unknown   BMI 45.73 kg/m  , BMI Body mass index is 45.73 kg/m. GENERAL:  Well appearing HEENT: Pupils equal round and reactive, fundi not visualized, oral mucosa unremarkable NECK:  No jugular venous distention, waveform  within normal limits, carotid upstroke brisk and symmetric, no bruits LUNGS:  Clear to auscultation bilaterally HEART:  RRR.  PMI not displaced or sustained,S1 and S2 within normal limits, no S3, no S4, no clicks, no rubs, no murmurs ABD:  Flat, positive bowel sounds normal in frequency in pitch, no bruits, no rebound, no guarding, no midline pulsatile mass, no hepatomegaly, no splenomegaly EXT:  2 plus pulses throughout, no edema, no cyanosis no clubbing SKIN:  No rashes no nodules NEURO:  Cranial nerves II through XII grossly intact, motor grossly intact throughout PSYCH:  Cognitively intact, oriented to person place and time   Labs    High Sensitivity Troponin:  No results for input(s): TROPONINIHS in the last 720 hours.    Chemistry Recent Labs  Lab 11/30/19 1650  NA 137  K 4.0  CL 104  CO2 21*  GLUCOSE 74  BUN 7  CREATININE 0.47  CALCIUM 9.2  PROT 6.9  ALBUMIN 3.1*  AST 19  ALT 27  ALKPHOS 118  BILITOT 0.6  GFRNONAA >60  GFRAA >60  ANIONGAP 12     Hematology Recent Labs  Lab 11/30/19 1650 12/01/19 1457 12/02/19 0222  WBC 9.3 7.9 9.4  RBC 4.07 3.43* 3.30*  HGB  12.1 10.4* 10.0*  HCT 37.7 32.2* 30.3*  MCV 92.6 93.9 91.8  MCH 29.7 30.3 30.3  MCHC 32.1 32.3 33.0  RDW 14.0 14.3 14.2  PLT 358 318 284    BNPNo results for input(s): BNP, PROBNP in the last 168 hours.   DDimer No results for input(s): DDIMER in the last 168 hours.   Radiology    [place] MFM FETAL BPP WO NON STRESS  Result Date: 12/01/2019 ----------------------------------------------------------------------  OBSTETRICS REPORT                        (Signed Final 12/01/2019 09:55 am) ---------------------------------------------------------------------- Patient Info  ID #:       [id number]                          D.O.B.:  19-Jul-1986 (34 yrs)  Name:       Alexis Frock                 Visit Date: 11/30/2019 08:25 pm ---------------------------------------------------------------------- Performed By   Performed By:     Felecia Jan        Referred By:       [place]  Attending:        Sander Nephew      [facility]                                        [place] ---------------------------------------------------------------------- Orders   #  Description                          Code         Ordered By   1  [place] MFM FETAL BPP WO NON              [id number]     [place]  ----------------------------------------------------------------------   #  Order #                    Accession #                 Episode #   1  [id number]                  [id number]                  [id number]  ---------------------------------------------------------------------- Indications   [redacted] weeks gestation of pregnancy                Z3A.39   Hypertension - Chronic/Pre-existing            O10.019   Poor obstetric history: Previous preterm       O09.219   delivery, antepartum (Twins @[redacted]w[redacted]d )   History of cesarean delivery, currently  O34.219   pregnant (x2)   Rh negative state in antepartum                O36.0190   Poor obstetrical history (PPROM)               O09.299   Grand multiparity, antepartum                  [id number]   Obesity complicating pregnancy, third          O99.213   trimester   Short interval between pregancies, 2nd         O09.892   trimester  ---------------------------------------------------------------------- Vital Signs                                                 Height:        5'2" ---------------------------------------------------------------------- Fetal Evaluation  Num Of Fetuses:          1  Fetal Heart Rate(bpm):   148  Cardiac Activity:        Observed  Presentation:            Cephalic  Amniotic Fluid  AFI FV:      Within normal limits  AFI Sum(cm)     %Tile       Largest Pocket(cm)  14.71           59          5.18  RUQ(cm)       RLQ(cm)       LUQ(cm)         LLQ(cm)  5.07          1.85          2.61           5.18 ---------------------------------------------------------------------- Biophysical Evaluation  Amniotic F.V:   Within normal limits       F. Tone:         Observed  F. Movement:    Observed                   Score:           8/8  F. Breathing:   Observed ---------------------------------------------------------------------- OB History  Gravidity:    7         Term:   5        Prem:   1  Living:       7 ---------------------------------------------------------------------- Gestational Age  LMP:           51w 0d        Date:  03/02/19                 EDD:   12/07/19  Best:          39w 0d     Det. By:  LMP  (03/02/19)          EDD:   12/07/19 ---------------------------------------------------------------------- Anatomy  Stomach:               Appears normal, left   Bladder:                Appears normal                         sided  Kidneys:  Appear normal ---------------------------------------------------------------------- Impression  Biophysical profile 8/8  Inpatient with prior equivocal antenatal testing  Known chronic hypertension requiring medication  Obesity and short interval testing  Velamentous cord insertion ---------------------------------------------------------------------- Recommendations  Given term preganancy I agree with the plan for admission  and delivery. ----------------------------------------------------------------------               Sander Nephew, MD Electronically Signed Final Report   12/01/2019 09:55 am ----------------------------------------------------------------------  [place] MFM FETAL BPP WO NON STRESS  Result Date: 11/30/2019 ----------------------------------------------------------------------  OBSTETRICS REPORT                       (Signed Final 11/30/2019 03:36 pm) ---------------------------------------------------------------------- Patient Info  ID #:       [id number]                           D.O.B.:  November 17, 1986 (33 yrs)  Name:       Wyatt Portela Fales                 Visit Date: 11/30/2019 02:01 pm ---------------------------------------------------------------------- Performed By  Performed By:     Novella Rob        Referred By:      [place]  Attending:        Tama High MD        [facility] ---------------------------------------------------------------------- Orders   #  Description                          Code         Ordered By   1  [place] MFM FETAL BPP WO NON              [id number]     RAVI [facility]      STRESS  ----------------------------------------------------------------------   #  Order #                    Accession #                 Episode #   1  [id number]                  [id number]                  [id number]  ---------------------------------------------------------------------- Indications   Hypertension - Chronic/Pre-existing            O10.019   Poor obstetric history: Previous preterm       O09.219   delivery, antepartum (Twins @[redacted]w[redacted]d )   History of cesarean delivery, currently        O87.219   pregnant (x2)   Rh  negative state in antepartum                O36.0190   Poor obstetrical history (PPROM)               O09.299   Grand multiparity, antepartum                  [id number]   Obesity complicating pregnancy, second         O99.212   trimester   Short interval between pregancies, 2nd         O09.892   trimester   Encounter for other antenatal screening        Z36.2   follow-up   [redacted] weeks gestation of pregnancy                Z3A.39  ---------------------------------------------------------------------- Vital Signs  Weight (lb): 259                               Height:        5'2"  BMI:         47.37 ---------------------------------------------------------------------- Fetal Evaluation  Num Of Fetuses:         1  Fetal  Heart Rate(bpm):  145  Cardiac Activity:       Observed  Presentation:           Cephalic  Placenta:               Posterior  P. Cord Insertion:      Previously Visualized  Amniotic Fluid  AFI FV:      Within normal limits  AFI Sum(cm)     %Tile       Largest Pocket(cm)  9.54            24          3.82  RUQ(cm)       RLQ(cm)       LUQ(cm)        LLQ(cm)  3.82          2.15          3.57           0 ---------------------------------------------------------------------- Biophysical Evaluation  Amniotic F.V:   Within normal limits       F. Tone:        Not Observed  F. Movement:    Observed                   Score:          4/8  F. Breathing:   Not Observed ---------------------------------------------------------------------- OB History  Gravidity:    7         Term:   5        Prem:   1  Living:       7 ---------------------------------------------------------------------- Gestational Age  LMP:           64w 0d        Date:  03/02/19                 EDD:   12/07/19  Best:          39w 0d     Det. By:  LMP  (03/02/19)          EDD:   12/07/19 ---------------------------------------------------------------------- Anatomy  Thoracic:              Appears normal  Kidneys:                Appear normal  Stomach:               Appears normal, left   Bladder:                Appears normal                         sided ---------------------------------------------------------------------- Cervix Uterus Adnexa  Cervix  Not visualized (advanced GA >24wks) ---------------------------------------------------------------------- Impression  Patient with chronic hypertension and inadequate prenatal  care return for fetal growth assessment and antenatal testing.  She had 2 previous cesarean deliveries most recent one was  in 2019 (twin pregnancy).  Her blood pressure today at our office were 148/99 and  152/89 mmHg.  She is not taking any antihypertensives.  On ultrasound, fetal growth is appropriate for gestational age.   Amniotic fluid is normal and good fetal activity seen.  Fetal  movements and fetal tone did not meet the criteria BPP.  BPP score: 4/8.  I informed the patient that her gestational age is 2 weeks  and she has chronic hypertension and a poor biophysical  profile.  All these placed the fetus at risk of stillbirth and I  recommended delivery now.  Patient was reluctant to be induced.  After ultrasound, Dr. Hulan Fray canceled her.  Patient was still undecided about delivery but agreed to go to  the [facility] for NST monitoring and counseling by the obstetric  team. ----------------------------------------------------------------------                  Tama High, MD Electronically Signed Final Report   11/30/2019 03:36 pm ----------------------------------------------------------------------  ECHOCARDIOGRAM COMPLETE  Result Date: 12/01/2019   ECHOCARDIOGRAM REPORT   Patient Name:   SOLENNE COLTHARP Date of Exam: 12/01/2019 Medical Rec #:  [id number]     Height:       62.0 in Accession #:    [id number]    Weight:       250.0 lb Date of Birth:  12-Apr-1986     BSA:          2.10 m Patient Age:    33 years      BP:           113/47 mmHg Patient Gender: F             HR:           89 bpm. Exam Location:  Inpatient Procedure: 2D Echo, Cardiac Doppler and Color Doppler                                 MODIFIED REPORT:   This report was modified by Lyman Bishop MD on 12/01/2019 due to Corrected atrial mass size, system changed to cm instead of mm, so decimel point was off.                             Measures <2 cm (20 mm).  Indications:     Right atrial mass.; [id number] Encounter for other preprocedural                  examination  History:         Patient has prior history of Echocardiogram examinations, most  recent 08/26/2019. Abnormal ECG, Signs/Symptoms:Syncope and                  Altered Mental Status; Risk Factors:Hypertension. RA mass.  Sonographer:     Roseanna Rainbow RDCS Referring Phys:  [id number] [place]  Diagnosing Phys: Lyman Bishop MD  Sonographer Comments: Suboptimal apical window, suboptimal subcostal window and patient is morbidly obese. Image acquisition challenging due to patient body habitus. Patient is pregnant and in third trimester. IMPRESSIONS  1. Left ventricular ejection fraction, by visual estimation, is 55 to 60%. The left ventricle has normal function. There is mildly increased left ventricular hypertrophy.  2. Left ventricular diastolic parameters are consistent with Grade I diastolic dysfunction (impaired relaxation).  3. The left ventricle has no regional wall motion abnormalities.  4. Global right ventricle has normal systolic function.The right ventricular size is normal. No increase in right ventricular wall thickness.  5. Left atrial size was normal.  6. Moderately sized mobile right atrial mass in the right atrial cavity.  7. Right atrial size was normal.  8. The mitral valve is abnormal. Trivial mitral valve regurgitation.  9. The tricuspid valve is grossly normal. 10. The aortic valve is tricuspid. Aortic valve regurgitation is not visualized. 11. The pulmonic valve was grossly normal. Pulmonic valve regurgitation is not visualized. 12. Normal pulmonary artery systolic pressure. 13. The inferior vena cava is normal in size with greater than 50% respiratory variability, suggesting right atrial pressure of 3 mmHg. 14. A prior study was performed on 08/26/19. 15. No significant change from prior study. FINDINGS  Left Ventricle: Left ventricular ejection fraction, by visual estimation, is 55 to 60%. The left ventricle has normal function. The left ventricle has no regional wall motion abnormalities. There is mildly increased left ventricular hypertrophy. Left ventricular diastolic parameters are consistent with Grade I diastolic dysfunction (impaired relaxation). Indeterminate filling pressures. Right Ventricle: The right ventricular size is normal. No increase in right ventricular wall  thickness. Global RV systolic function is has normal systolic function. The tricuspid regurgitant velocity is 2.15 m/s, and with an assumed right atrial pressure  of 3 mmHg, the estimated right ventricular systolic pressure is normal at 21.5 mmHg. Left Atrium: Left atrial size was normal in size. Right Atrium: Right atrial size was normal in size There is a moderately sized mobile right atrial mass in the right atrial cavity. This mass is spherical in shape. This mass is solid in texture. 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 The mass measures approximately 1.8 cm by 1.1 cm. Pericardium: There is no evidence of pericardial effusion. Mitral Valve: The mitral valve is abnormal. There is mild thickening of the mitral valve leaflet(s). Trivial mitral valve regurgitation. Tricuspid Valve: The tricuspid valve is grossly normal. Tricuspid valve regurgitation is mild. Aortic Valve: The aortic valve is tricuspid. Aortic valve regurgitation is not visualized. Pulmonic Valve: The pulmonic valve was grossly normal. Pulmonic valve regurgitation is not visualized. Pulmonic regurgitation is not visualized. Aorta: The aortic root and ascending aorta are structurally normal, with no evidence of dilitation. Venous: The inferior vena cava is normal in size with greater than 50% respiratory variability, suggesting right atrial pressure of 3 mmHg. IAS/Shunts: No atrial level shunt detected by color flow Doppler. Additional Comments: A prior study was performed on 08/26/19.  LEFT VENTRICLE PLAX 2D LVIDd:         4.80 cm       Diastology LVIDs:         3.50 cm  LV e' lateral:   10.80 cm/s LV PW:         1.40 cm       LV E/e' lateral: 10.3 LV IVS:        1.10 cm       LV e' medial:    7.18 cm/s LVOT diam:     2.20 cm       LV E/e' medial:  15.5 LV SV:         57 ml LV SV Index:   24.67 LVOT Area:     3.80 cm  LV Volumes (MOD) LV area d, A2C:    32.50 cm LV area d, A4C:    34.60 cm LV area s, A2C:    20.60 cm LV area s, A4C:    20.70  cm LV major d, A2C:   7.29 cm LV major d, A4C:   7.41 cm LV major s, A2C:   6.27 cm LV major s, A4C:   6.55 cm LV vol d, MOD A2C: 122.0 ml LV vol d, MOD A4C: 132.0 ml LV vol s, MOD A2C: 57.7 ml LV vol s, MOD A4C: 55.4 ml LV SV MOD A2C:     64.3 ml LV SV MOD A4C:     132.0 ml LV SV MOD BP:      69.9 ml RIGHT VENTRICLE             IVC RV S prime:     12.00 cm/s  IVC diam: 1.90 cm TAPSE (M-mode): 2.3 cm LEFT ATRIUM             Index       RIGHT ATRIUM           Index LA diam:        4.30 cm 2.05 cm/m  RA Area:     10.40 cm LA Vol (A2C):   57.5 ml 27.36 ml/m RA Volume:   22.60 ml  10.75 ml/m LA Vol (A4C):   63.0 ml 29.97 ml/m LA Biplane Vol: 61.7 ml 29.35 ml/m  AORTIC VALVE              PULMONIC VALVE LVOT Vmax:   220.00 cm/s  PR End Diast Vel: 1.80 msec LVOT Vmean:  161.000 cm/s LVOT VTI:    0.384 m  AORTA Ao Root diam: 2.80 cm Ao Asc diam:  2.50 cm MITRAL VALVE                         TRICUSPID VALVE MV Area (PHT): 3.72 cm              TR Peak grad:   18.5 mmHg MV PHT:        59.16 msec            TR Vmax:        215.00 cm/s MV Decel Time: 204 msec MV E velocity: 111.00 cm/s 103 cm/s  SHUNTS MV A velocity: 118.00 cm/s 70.3 cm/s Systemic VTI:  0.38 m MV E/A ratio:  0.94        1.5       Systemic Diam: 2.20 cm  Lyman Bishop MD Electronically signed by Lyman Bishop MD Signature Date/Time: 12/01/2019/9:03:36 AM    Final (Updated)     Cardiac Studies   ECHO 12/01/2019:  Left ventricular ejection fraction, by visual estimation, is 55 to 60%. The left ventricle has normal function. There is mildly increased left ventricular hypertrophy. 2.  Left ventricular diastolic parameters are consistent with Grade I diastolic dysfunction (impaired relaxation). 3. The left ventricle has no regional wall motion abnormalities. 4. Global right ventricle has normal systolic function.The right ventricular size is normal. No increase in right ventricular wall thickness. 5. Left atrial size was normal. 6. Moderately  sized mobile right atrial mass in the right atrial cavity. 7. Right atrial size was normal. 8. The mitral valve is abnormal. Trivial mitral valve regurgitation. 9. The tricuspid valve is grossly normal. 10. The aortic valve is tricuspid. Aortic valve regurgitation is not visualized. 11. The pulmonic valve was grossly normal. Pulmonic valve regurgitation is not visualized. 12. Normal pulmonary artery systolic pressure. 13. The inferior vena cava is normal in size with greater than 50% respiratory variability, suggesting right atrial pressure of 3 mmHg. 14. A prior study was performed on 08/26/19. 15. No significant change from prior study.  Echocardiogram 08/25/2019:  Left ventricular ejection fraction, by visual estimation, is 50 to 55%. The left ventricle has normal function. Normal left ventricular size. There is no left ventricular hypertrophy. 2. Global right ventricle has normal systolic function.The right ventricular size is normal. 3. Left atrial size was normal. 4. Right atrial size was normal. 5. The mitral valve is normal in structure. Trace mitral valve regurgitation. No evidence of mitral stenosis. 6. The tricuspid valve is normal in structure. Tricuspid valve regurgitation is trivial. 7. The aortic valve is normal in structure. Aortic valve regurgitation was not visualized by color flow Doppler. Structurally normal aortic valve, with no evidence of sclerosis or stenosis. 8. The pulmonic valve was normal in structure. Pulmonic valve regurgitation is trivial by color flow Doppler. 9. Normal pulmonary artery systolic pressure. 10. The inferior vena cava is normal in size with greater than 50% respiratory variability, suggesting right atrial pressure of 3 mmHg. 11. Normal LV function; 1.5 x 1.2 cm mass in right atrium of uncertain etiology (possible myxoma or thrombus).  Patient Profile     34 y.o. female with RA mass severe, morbid obesity, recent COVID-19, hypertension,  and cognitive impairment admitted for induction of labor.  Assessment & Plan    # RA Mass:  Echo this admission showed persistent RA mass.  She was previously on lovenox but self-discontinued.  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  Cannot completely exclue thrombus, though this is less likely.  Ultimately she will need TEE and CT surgical consultation.  This could typically be completed as an outpatient. However, given her social situation and missing both cardiology and OB appointments in the past, I am quite concerned she will not return for these interventions.  She is willing to stay for TEE Monday and we will ask CT surgery to see her after TEE.  We will arrange if she is still here.  Otherwise, we will arrange for this to occur outpatient.  Initially there was concern about her ability to care for the 8 children including a newborn if she requires a sternotomy.  However, the children are not in her custody.  She reports having assistance from family and friends, but has not had any visitors at the hospital.  We discussed the risk of arrhythmia, thrombus formation or embolization and sudden cardiac death.  She expressed understanding.   # Hypertension:  Continue labetalol.  For questions or updates, please contact [place] Please consult [email or web address] for contact info under        Signed, Skeet Latch, MD  12/02/2019, 9:47 AM

## 2019-12-02 NOTE — Progress Notes (Signed)
CSW provided escort for [name] [name], after-hours [facility] case worker, to MOB's room to complete assessment. CSW spoke with [name] [name] following meeting with MOB who reported disposition, at this time, is still unknown. CPS requesting that infant be held until Monday so that a safe disposition can be arranged. CSW spoke with MD who was agreeable to this.   At this time, there are barriers to infant discharging to MOB. CSW will continue to follow and await disposition.   [name] Miles, LCSW [facility] [employer] [phone]

## 2019-12-02 NOTE — Lactation Note (Signed)
This note was copied from a baby's chart. Lactation Consultation Note  Patient Name: [name] [id number] Date: 12/02/2019   RN requested LC not visit mom at this time.  The situation/status of baby remaining with mom is uncertain.  Possibility of infant being placed in nursery care away from mom is pending.    LC requested for moms RN to please reach out to lactation when and if the dyad needed to be seen.  Message passed along to fellow/oncoming LC.     Maternal Data    Feeding Feeding Type: Breast Fed  LATCH Score Latch: Repeated attempts needed to sustain latch, nipple held in mouth throughout feeding, stimulation needed to elicit sucking reflex.  Audible Swallowing: A few with stimulation  Type of Nipple: Everted at rest and after stimulation  Comfort (Breast/Nipple): Soft / non-tender  Hold (Positioning): Assistance needed to correctly position infant at breast and maintain latch.  LATCH Score: 7  Interventions    Lactation Tools Discussed/Used     Consult Status      Ferne Coe [facility] 12/02/2019, 4:22 PM
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
## 2019-12-03 MED ORDER — RHO D IMMUNE GLOBULIN 1500 UNIT/2ML IJ SOSY
300.0000 ug | PREFILLED_SYRINGE | Freq: Once | INTRAMUSCULAR | Status: DC
  Filled 2019-12-03: qty 2

## 2019-12-03 NOTE — Plan of Care (Signed)
  Problem: Education: Goal: Knowledge of General Education information will improve Description: Including pain rating scale, medication(s)/side effects and non-pharmacologic comfort measures Outcome: Completed/Met   Problem: Clinical Measurements: Goal: Ability to maintain clinical measurements within normal limits will improve Outcome: Completed/Met Goal: Will remain free from infection Outcome: Completed/Met Goal: Diagnostic test results will improve Outcome: Completed/Met Goal: Respiratory complications will improve Outcome: Completed/Met Goal: Cardiovascular complication will be avoided Outcome: Completed/Met   Problem: Activity: Goal: Risk for activity intolerance will decrease Outcome: Completed/Met   Problem: Nutrition: Goal: Adequate nutrition will be maintained Outcome: Completed/Met   Problem: Pain Managment: Goal: General experience of comfort will improve Outcome: Completed/Met

## 2019-12-03 NOTE — Plan of Care (Signed)
  Problem: Education: Goal: Knowledge of condition will improve Outcome: Completed/Met   Problem: Activity: Goal: Will verbalize the importance of balancing activity with adequate rest periods Outcome: Completed/Met   Problem: Life Cycle: Goal: Chance of risk for complications during the postpartum period will decrease Outcome: Completed/Met   Problem: Role Relationship: Goal: Ability to demonstrate positive interaction with newborn will improve Outcome: Completed/Met   Problem: Education: Goal: Knowledge of General Education information will improve Description: Including pain rating scale, medication(s)/side effects and non-pharmacologic comfort measures Outcome: Completed/Met   Problem: Health Behavior/Discharge Planning: Goal: Ability to manage health-related needs will improve Outcome: Completed/Met   Problem: Clinical Measurements: Goal: Ability to maintain clinical measurements within normal limits will improve Outcome: Completed/Met Goal: Will remain free from infection Outcome: Completed/Met Goal: Diagnostic test results will improve Outcome: Completed/Met Goal: Respiratory complications will improve Outcome: Completed/Met Goal: Cardiovascular complication will be avoided Outcome: Completed/Met   Problem: Activity: Goal: Risk for activity intolerance will decrease Outcome: Completed/Met   Problem: Nutrition: Goal: Adequate nutrition will be maintained Outcome: Completed/Met   Problem: Pain Managment: Goal: General experience of comfort will improve Outcome: Completed/Met   Problem: Safety: Goal: Ability to remain free from injury will improve Outcome: Completed/Met

## 2019-12-03 NOTE — Lactation Note (Addendum)
This note was copied from a baby's chart. Lactation Consultation Note:  P8, infant is 37 hours old.  Mother is breastfeeding with little supplementation of formula.  Mother was given [facility] brochure. She reports that the infant is cluster feeding.   Mother reports that she last breastfed for 40 mins on one breast and then infant took 10 ml of formula.   Mother reports that she would like to pump and use ebm to supplement as well as breastfeeding.  Mother was sat up with a DEBP and she also reported that she used a #30 or #36 flanges with last children.  Mother was fit with  #30 flanges. Assist mother with pumping and advised to pump after each feeding .   Mother was also given a harmony hand pump at her request.   Mother reports that she hears infant swallow. She denies having any discomfort with breastfeeding.  Mother encouraged to continue to cue base feed and to feed infant 8-12 times in 24 hours. Mother receptive to all teaching.   Patient Name: [name] [name] [id number] Date: 12/03/2019 Reason for consult: Initial assessment   Maternal Data Has patient been taught Hand Expression?: Yes Does the patient have breastfeeding experience prior to this delivery?: Yes  Feeding    LATCH Score                   Interventions Interventions: Hand express;Hand pump;DEBP  Lactation Tools Discussed/Used WIC Program: Yes Pump Review: Setup, frequency, and cleaning;Milk Storage Initiated by:: Jess Barters RN,IBCLC Date initiated:: 12/04/19   Consult Status Consult Status: Follow-up Date: 12/04/19 Follow-up type: In-patient    Jess Barters [facility] 12/03/2019, 1:44 PM

## 2019-12-03 NOTE — Progress Notes (Addendum)
Patient ID: [name] [name], female   DOB: 09/19/1986, 34 y.o.   MRN: [id number]    POSTPARTUM PROGRESS NOTE  Post Partum Day 2  Subjective:  [name] [name] is a 34 y.o. [id number] s/p VBAC at [redacted]w[redacted]d.  No acute events overnight.  Pt denies problems with ambulating, voiding or po intake.  She denies nausea or vomiting.  Pain is well controlled.  She has had flatus. She has had bowel movement.  Lochia Minimal.   Objective: Blood pressure (!) 145/90, pulse 89, temperature 98 F (36.7 C), temperature source Oral, resp. rate 18, height 5\' 2"  (1.575 m), weight 113.4 kg, last menstrual period 03/02/2019, SpO2 100 %, unknown if currently breastfeeding.  Physical Exam:  General: alert, cooperative and no distress Chest: no respiratory distress Heart:regular rate, distal pulses intact Abdomen: soft, nontender,  Uterine Fundus: firm, appropriately tender DVT Evaluation: No calf swelling or tenderness Extremities: No edema Skin: warm, dry  Recent Labs    12/01/19 1457 12/02/19 0222  HGB 10.4* 10.0*  HCT 32.2* 30.3*    Assessment/Plan: [name] [name] is a 34 y.o. [id number] s/p VBAC at [redacted]w[redacted]d   PPD# 2 - Doing well Contraception: Depo Feeding: Breast  Dispo: Plan for discharge Monday. #chronic HTN, on labetalol, BP mildly elevated. Continue to monitor.  # Rhogam not needed, baby is RN negative       LOS: 3 days   Jaelyn Cloninger, Artist Pais, NP, CNM 12/03/2019, 10:05 AM

## 2019-12-04 MED ORDER — LABETALOL HCL 100 MG PO TABS
150.0000 mg | ORAL_TABLET | Freq: Two times a day (BID) | ORAL | Status: DC
  Administered 2019-12-04: 150 mg via ORAL
  Filled 2019-12-04: qty 2

## 2019-12-04 NOTE — Progress Notes (Addendum)
Progress Note  Patient Name: [name] [name] Date of Encounter: 12/04/2019  Primary Cardiologist: Skeet Latch, MD   Subjective   Feeling well.  No chest pain or shortness of breath.  OK with TEE tomorrow.  Wondering what will happen to her baby.   Inpatient Medications    Scheduled Meds: . labetalol  100 mg Oral BID  . measles, mumps & rubella vaccine  0.5 mL Subcutaneous Once  . medroxyPROGESTERone  150 mg Intramuscular Q90 days  . prenatal multivitamin  1 tablet Oral Q1200  . senna-docusate  2 tablet Oral Q24H  . Tdap  0.5 mL Intramuscular Once   Continuous Infusions:  PRN Meds: acetaminophen, benzocaine-Menthol, coconut oil, witch hazel-glycerin **AND** dibucaine, diphenhydrAMINE, ibuprofen, ondansetron **OR** ondansetron (ZOFRAN) IV, simethicone   Vital Signs    Vitals:   12/03/19 1939 12/03/19 2210 12/04/19 0535 12/04/19 1142  BP: 137/87 137/87 119/66 (!) 149/91  Pulse: 90 (!) 101 99 93  Resp: 17 17 16    Temp: 98.2 F (36.8 C) 98.6 F (37 C) 98.7 F (37.1 C)   TempSrc: Oral Oral Oral   SpO2: 100%  100%   Weight:      Height:        Intake/Output Summary (Last 24 hours) at 12/04/2019 1249 Last data filed at 12/04/2019 0400 Gross per 24 hour  Intake 0 ml  Output --  Net 0 ml   Last 3 Weights 11/30/2019 11/30/2019 09/21/2019  Weight (lbs) 250 lb 250 lb 259 lb 14.4 oz  Weight (kg) 113.4 kg 113.399 kg 117.89 kg      Telemetry    n/a - Personally Reviewed  ECG    N/a - Personally Reviewed  Physical Exam   VS:  BP (!) 149/91   Pulse 93   Temp 98.7 F (37.1 C) (Oral)   Resp 16   Ht 5\' 2"  (1.575 m)   Wt 113.4 kg   LMP 03/02/2019   SpO2 100%   Breastfeeding Unknown   BMI 45.73 kg/m  , BMI Body mass index is 45.73 kg/m. GENERAL:  Well appearing HEENT: Pupils equal round and reactive, fundi not visualized, oral mucosa unremarkable NECK:  No jugular venous distention, waveform within normal limits, carotid upstroke brisk and symmetric, no  bruits LUNGS:  Clear to auscultation bilaterally HEART:  RRR.  PMI not displaced or sustained,S1 and S2 within normal limits, no S3, no S4, no clicks, no rubs, no murmurs ABD:  Flat, positive bowel sounds normal in frequency in pitch, no bruits, no rebound, no guarding, no midline pulsatile mass, no hepatomegaly, no splenomegaly EXT:  2 plus pulses throughout, no edema, no cyanosis no clubbing SKIN:  No rashes no nodules NEURO:  Cranial nerves II through XII grossly intact, motor grossly intact throughout PSYCH:  Cognitively intact, oriented to person place and time   Labs    High Sensitivity Troponin:  No results for input(s): TROPONINIHS in the last 720 hours.    Chemistry Recent Labs  Lab 11/30/19 1650  NA 137  K 4.0  CL 104  CO2 21*  GLUCOSE 74  BUN 7  CREATININE 0.47  CALCIUM 9.2  PROT 6.9  ALBUMIN 3.1*  AST 19  ALT 27  ALKPHOS 118  BILITOT 0.6  GFRNONAA >60  GFRAA >60  ANIONGAP 12     Hematology Recent Labs  Lab 11/30/19 1650 12/01/19 1457 12/02/19 0222  WBC 9.3 7.9 9.4  RBC 4.07 3.43* 3.30*  HGB 12.1 10.4* 10.0*  HCT 37.7 32.2* 30.3*  MCV 92.6 93.9 91.8  MCH 29.7 30.3 30.3  MCHC 32.1 32.3 33.0  RDW 14.0 14.3 14.2  PLT 358 318 284    BNPNo results for input(s): BNP, PROBNP in the last 168 hours.   DDimer No results for input(s): DDIMER in the last 168 hours.   Radiology    No results found.  Cardiac Studies   ECHO 12/01/2019:  Left ventricular ejection fraction, by visual estimation, is 55 to 60%. The left ventricle has normal function. There is mildly increased left ventricular hypertrophy. 2. Left ventricular diastolic parameters are consistent with Grade I diastolic dysfunction (impaired relaxation). 3. The left ventricle has no regional wall motion abnormalities. 4. Global right ventricle has normal systolic function.The right ventricular size is normal. No increase in right ventricular wall thickness. 5. Left atrial size was  normal. 6. Moderately sized mobile right atrial mass in the right atrial cavity. 7. Right atrial size was normal. 8. The mitral valve is abnormal. Trivial mitral valve regurgitation. 9. The tricuspid valve is grossly normal. 10. The aortic valve is tricuspid. Aortic valve regurgitation is not visualized. 11. The pulmonic valve was grossly normal. Pulmonic valve regurgitation is not visualized. 12. Normal pulmonary artery systolic pressure. 13. The inferior vena cava is normal in size with greater than 50% respiratory variability, suggesting right atrial pressure of 3 mmHg. 14. A prior study was performed on 08/26/19. 15. No significant change from prior study.  Echocardiogram 08/25/2019:  Left ventricular ejection fraction, by visual estimation, is 50 to 55%. The left ventricle has normal function. Normal left ventricular size. There is no left ventricular hypertrophy. 2. Global right ventricle has normal systolic function.The right ventricular size is normal. 3. Left atrial size was normal. 4. Right atrial size was normal. 5. The mitral valve is normal in structure. Trace mitral valve regurgitation. No evidence of mitral stenosis. 6. The tricuspid valve is normal in structure. Tricuspid valve regurgitation is trivial. 7. The aortic valve is normal in structure. Aortic valve regurgitation was not visualized by color flow Doppler. Structurally normal aortic valve, with no evidence of sclerosis or stenosis. 8. The pulmonic valve was normal in structure. Pulmonic valve regurgitation is trivial by color flow Doppler. 9. Normal pulmonary artery systolic pressure. 10. The inferior vena cava is normal in size with greater than 50% respiratory variability, suggesting right atrial pressure of 3 mmHg. 11. Normal LV function; 1.5 x 1.2 cm mass in right atrium of uncertain etiology (possible myxoma or thrombus).  Patient Profile     34 y.o. female with RA mass severe, morbid obesity, recent  COVID-19, hypertension, and cognitive impairment admitted for induction of labor.  Assessment & Plan    # RA Mass:  Echo this admission showed persistent RA mass.  She was previously on lovenox but self-discontinued.  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  Cannot completely exclue thrombus, though this is less likely.  She will need TEE and CT surgical consultation.  Planning for TEE tomorrow.  We will know the time first thing in the AM.  NPO after midnight.  We will ask CT surgery to see her after TEE. This could typically be completed as an outpatient. However, given her social situation and missing both cardiology and OB appointments in the past, I am quite concerned she will not return for these interventions.   Initially there was concern about her ability to care for the 8 children including a newborn if she requires a sternotomy.  However, the children  are not in her custody.  She reports having assistance from family and friends, but has not had any visitors at the hospital.  We discussed the risk of arrhythmia, thrombus formation or embolization and sudden cardiac death.  She expressed understanding.  # Hypertension:  Blood pressure poorly controlled. Increase labetalol to 150mg  bid.  For questions or updates, please contact [place] Please consult [email or web address] for contact info under        Signed, Skeet Latch, MD  12/04/2019, 12:49 PM        [facility] has been requested to perform a transesophageal echocardiogram on [name] for right atrial mass.  After careful review of history and examination, the risks and benefits of transesophageal echocardiogram have been explained including risks of esophageal damage, perforation (1:10,000 risk), bleeding, pharyngeal hematoma as well as other potential complications associated with conscious sedation including aspiration, arrhythmia, respiratory failure and death. Alternatives to treatment were  discussed, questions were answered. Patient is willing to proceed.   Traevion Poehler C. Oval Linsey, MD, [facility]  12/05/2019 11:31 AM

## 2019-12-04 NOTE — Progress Notes (Addendum)
Patient ID: [name] [name], female   DOB: 1986-07-14, 34 y.o.   MRN: [id number]    POSTPARTUM PROGRESS NOTE  Post Partum Day 3  Subjective:  [name] [name] is a 34 y.o. [id number] s/p VBAC at [redacted]w[redacted]d.  No acute events overnight.  Pt denies problems with ambulating, voiding or po intake.  She denies nausea or vomiting.  Pain is well controlled.  She has had flatus. She has had bowel movement.  Lochia Minimal.   Objective: Blood pressure 119/66, pulse 99, temperature 98.7 F (37.1 C), temperature source Oral, resp. rate 16, height 5\' 2"  (1.575 m), weight 113.4 kg, last menstrual period 03/02/2019, SpO2 100 %, unknown if currently breastfeeding.  Physical Exam:  General: alert, cooperative and no distress Chest: no respiratory distress Heart:regular rate, distal pulses intact Abdomen: soft, nontender,  Uterine Fundus: firm, appropriately tender DVT Evaluation: No calf swelling or tenderness Extremities: No edema Skin: warm, dry  Recent Labs    12/01/19 1457 12/02/19 0222  HGB 10.4* 10.0*  HCT 32.2* 30.3*    Assessment/Plan: [name] [name] is a 34 y.o. [id number] s/p VBAC at [redacted]w[redacted]d   PPD# 3 - Doing well Contraception: Depo Feeding: Breast  Dispo: Barriers to discharge per SW - awaiting CPS as patient has children in DSS custody.   #chronic HTN Bps stable.  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     Starr Lake, [place] 12/04/2019 7:20 AM

## 2019-12-04 NOTE — H&P (View-Only) (Signed)
Progress Note  Patient Name: [name] [name] Date of Encounter: 12/04/2019  Primary Cardiologist: Skeet Latch, MD   Subjective   Feeling well.  No chest pain or shortness of breath.  OK with TEE tomorrow.  Wondering what will happen to her baby.   Inpatient Medications    Scheduled Meds: . labetalol  100 mg Oral BID  . measles, mumps & rubella vaccine  0.5 mL Subcutaneous Once  . medroxyPROGESTERone  150 mg Intramuscular Q90 days  . prenatal multivitamin  1 tablet Oral Q1200  . senna-docusate  2 tablet Oral Q24H  . Tdap  0.5 mL Intramuscular Once   Continuous Infusions:  PRN Meds: acetaminophen, benzocaine-Menthol, coconut oil, witch hazel-glycerin **AND** dibucaine, diphenhydrAMINE, ibuprofen, ondansetron **OR** ondansetron (ZOFRAN) IV, simethicone   Vital Signs    Vitals:   12/03/19 1939 12/03/19 2210 12/04/19 0535 12/04/19 1142  BP: 137/87 137/87 119/66 (!) 149/91  Pulse: 90 (!) 101 99 93  Resp: 17 17 16    Temp: 98.2 F (36.8 C) 98.6 F (37 C) 98.7 F (37.1 C)   TempSrc: Oral Oral Oral   SpO2: 100%  100%   Weight:      Height:        Intake/Output Summary (Last 24 hours) at 12/04/2019 1249 Last data filed at 12/04/2019 0400 Gross per 24 hour  Intake 0 ml  Output --  Net 0 ml   Last 3 Weights 11/30/2019 11/30/2019 09/21/2019  Weight (lbs) 250 lb 250 lb 259 lb 14.4 oz  Weight (kg) 113.4 kg 113.399 kg 117.89 kg      Telemetry    n/a - Personally Reviewed  ECG    N/a - Personally Reviewed  Physical Exam   VS:  BP (!) 149/91   Pulse 93   Temp 98.7 F (37.1 C) (Oral)   Resp 16   Ht 5\' 2"  (1.575 m)   Wt 113.4 kg   LMP 03/02/2019   SpO2 100%   Breastfeeding Unknown   BMI 45.73 kg/m  , BMI Body mass index is 45.73 kg/m. GENERAL:  Well appearing HEENT: Pupils equal round and reactive, fundi not visualized, oral mucosa unremarkable NECK:  No jugular venous distention, waveform within normal limits, carotid upstroke brisk and symmetric, no  bruits LUNGS:  Clear to auscultation bilaterally HEART:  RRR.  PMI not displaced or sustained,S1 and S2 within normal limits, no S3, no S4, no clicks, no rubs, no murmurs ABD:  Flat, positive bowel sounds normal in frequency in pitch, no bruits, no rebound, no guarding, no midline pulsatile mass, no hepatomegaly, no splenomegaly EXT:  2 plus pulses throughout, no edema, no cyanosis no clubbing SKIN:  No rashes no nodules NEURO:  Cranial nerves II through XII grossly intact, motor grossly intact throughout PSYCH:  Cognitively intact, oriented to person place and time   Labs    High Sensitivity Troponin:  No results for input(s): TROPONINIHS in the last 720 hours.    Chemistry Recent Labs  Lab 11/30/19 1650  NA 137  K 4.0  CL 104  CO2 21*  GLUCOSE 74  BUN 7  CREATININE 0.47  CALCIUM 9.2  PROT 6.9  ALBUMIN 3.1*  AST 19  ALT 27  ALKPHOS 118  BILITOT 0.6  GFRNONAA >60  GFRAA >60  ANIONGAP 12     Hematology Recent Labs  Lab 11/30/19 1650 12/01/19 1457 12/02/19 0222  WBC 9.3 7.9 9.4  RBC 4.07 3.43* 3.30*  HGB 12.1 10.4* 10.0*  HCT 37.7 32.2* 30.3*  MCV 92.6 93.9 91.8  MCH 29.7 30.3 30.3  MCHC 32.1 32.3 33.0  RDW 14.0 14.3 14.2  PLT 358 318 284    BNPNo results for input(s): BNP, PROBNP in the last 168 hours.   DDimer No results for input(s): DDIMER in the last 168 hours.   Radiology    No results found.  Cardiac Studies   ECHO 12/01/2019:  Left ventricular ejection fraction, by visual estimation, is 55 to 60%. The left ventricle has normal function. There is mildly increased left ventricular hypertrophy. 2. Left ventricular diastolic parameters are consistent with Grade I diastolic dysfunction (impaired relaxation). 3. The left ventricle has no regional wall motion abnormalities. 4. Global right ventricle has normal systolic function.The right ventricular size is normal. No increase in right ventricular wall thickness. 5. Left atrial size was  normal. 6. Moderately sized mobile right atrial mass in the right atrial cavity. 7. Right atrial size was normal. 8. The mitral valve is abnormal. Trivial mitral valve regurgitation. 9. The tricuspid valve is grossly normal. 10. The aortic valve is tricuspid. Aortic valve regurgitation is not visualized. 11. The pulmonic valve was grossly normal. Pulmonic valve regurgitation is not visualized. 12. Normal pulmonary artery systolic pressure. 13. The inferior vena cava is normal in size with greater than 50% respiratory variability, suggesting right atrial pressure of 3 mmHg. 14. A prior study was performed on 08/26/19. 15. No significant change from prior study.  Echocardiogram 08/25/2019:  Left ventricular ejection fraction, by visual estimation, is 50 to 55%. The left ventricle has normal function. Normal left ventricular size. There is no left ventricular hypertrophy. 2. Global right ventricle has normal systolic function.The right ventricular size is normal. 3. Left atrial size was normal. 4. Right atrial size was normal. 5. The mitral valve is normal in structure. Trace mitral valve regurgitation. No evidence of mitral stenosis. 6. The tricuspid valve is normal in structure. Tricuspid valve regurgitation is trivial. 7. The aortic valve is normal in structure. Aortic valve regurgitation was not visualized by color flow Doppler. Structurally normal aortic valve, with no evidence of sclerosis or stenosis. 8. The pulmonic valve was normal in structure. Pulmonic valve regurgitation is trivial by color flow Doppler. 9. Normal pulmonary artery systolic pressure. 10. The inferior vena cava is normal in size with greater than 50% respiratory variability, suggesting right atrial pressure of 3 mmHg. 11. Normal LV function; 1.5 x 1.2 cm mass in right atrium of uncertain etiology (possible myxoma or thrombus).  Patient Profile     34 y.o. female with RA mass severe, morbid obesity, recent  COVID-19, hypertension, and cognitive impairment admitted for induction of labor.  Assessment & Plan    # RA Mass:  Echo this admission showed persistent RA mass.  She was previously on lovenox but self-discontinued.  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  Cannot completely exclue thrombus, though this is less likely.  She will need TEE and CT surgical consultation.  Planning for TEE tomorrow.  We will know the time first thing in the AM.  NPO after midnight.  We will ask CT surgery to see her after TEE. This could typically be completed as an outpatient. However, given her social situation and missing both cardiology and OB appointments in the past, I am quite concerned she will not return for these interventions.   Initially there was concern about her ability to care for the 8 children including a newborn if she requires a sternotomy.  However, the children  are not in her custody.  She reports having assistance from family and friends, but has not had any visitors at the hospital.  We discussed the risk of arrhythmia, thrombus formation or embolization and sudden cardiac death.  She expressed understanding.  # Hypertension:  Blood pressure poorly controlled. Increase labetalol to 150mg  bid.  For questions or updates, please contact [place] Please consult [email or web address] for contact info under        Signed, Skeet Latch, MD  12/04/2019, 12:49 PM        [facility] has been requested to perform a transesophageal echocardiogram on [name] for right atrial mass.  After careful review of history and examination, the risks and benefits of transesophageal echocardiogram have been explained including risks of esophageal damage, perforation (1:10,000 risk), bleeding, pharyngeal hematoma as well as other potential complications associated with conscious sedation including aspiration, arrhythmia, respiratory failure and death. Alternatives to treatment were  discussed, questions were answered. Patient is willing to proceed.   Lashaundra Lehrmann C. Oval Linsey, MD, [facility]  12/05/2019 11:31 AM

## 2019-12-05 ENCOUNTER — Encounter (HOSPITAL_COMMUNITY)
Admission: AD | Disposition: A | Discharge status: Home / Self Care | Payer: Self-pay | Source: Home / Self Care | Attending: Obstetrics & Gynecology

## 2019-12-05 ENCOUNTER — Encounter (HOSPITAL_COMMUNITY): Payer: Self-pay | Admitting: Obstetrics & Gynecology

## 2019-12-05 ENCOUNTER — Inpatient Hospital Stay (HOSPITAL_COMMUNITY): Payer: Medicaid Other | Admitting: Certified Registered Nurse Anesthetist

## 2019-12-05 ENCOUNTER — Inpatient Hospital Stay (HOSPITAL_COMMUNITY): Payer: Medicaid Other

## 2019-12-05 DIAGNOSIS — I517 Cardiomegaly: Secondary | ICD-10-CM

## 2019-12-05 DIAGNOSIS — D151 Benign neoplasm of heart: Secondary | ICD-10-CM

## 2019-12-05 HISTORY — PX: TEE WITHOUT CARDIOVERSION: SHX5443

## 2019-12-05 SURGERY — ECHOCARDIOGRAM, TRANSESOPHAGEAL
Anesthesia: Monitor Anesthesia Care

## 2019-12-05 MED ORDER — SODIUM CHLORIDE 0.9 % IV SOLN: INTRAVENOUS | Status: DC

## 2019-12-05 MED ORDER — IBUPROFEN 600 MG PO TABS: 600.0000 mg | ORAL_TABLET | Freq: Three times a day (TID) | ORAL | 0 refills | Status: DC | PRN

## 2019-12-05 MED ORDER — PROPOFOL 10 MG/ML IV BOLUS
INTRAVENOUS | Status: DC | PRN
  Administered 2019-12-05: 30 mg via INTRAVENOUS
  Administered 2019-12-05: 20 mg via INTRAVENOUS
  Administered 2019-12-05: 30 mg via INTRAVENOUS

## 2019-12-05 MED ORDER — FERROUS SULFATE 325 (65 FE) MG PO TABS: 325.0000 mg | ORAL_TABLET | Freq: Every day | ORAL | 0 refills | Status: DC

## 2019-12-05 MED ORDER — FERROUS SULFATE 325 (65 FE) MG PO TABS: 325.0000 mg | ORAL_TABLET | Freq: Every day | ORAL | Status: DC

## 2019-12-05 MED ORDER — PROPOFOL 500 MG/50ML IV EMUL
INTRAVENOUS | Status: DC | PRN
  Administered 2019-12-05: 100 ug/kg/min via INTRAVENOUS

## 2019-12-05 MED ORDER — AMLODIPINE BESYLATE 5 MG PO TABS
5.0000 mg | ORAL_TABLET | Freq: Every day | ORAL | Status: DC
  Administered 2019-12-05: 5 mg via ORAL
  Filled 2019-12-05: qty 1

## 2019-12-05 MED ORDER — AMLODIPINE BESYLATE 5 MG PO TABS: 5.0000 mg | ORAL_TABLET | Freq: Every day | ORAL | 0 refills | Status: DC

## 2019-12-05 MED FILL — FERROUS SULFATE 325 MG TAB: 325 (65 FE) | 30 days supply | Qty: 30 | Fill #0

## 2019-12-05 MED FILL — IBUPROFEN 600 MG TABLET: 600 | 10 days supply | Qty: 30 | Fill #0

## 2019-12-05 MED FILL — AMLODIPINE BESYLATE 5 MG TA: 5 | 30 days supply | Qty: 30 | Fill #0

## 2019-12-05 NOTE — Transfer of Care (Signed)
Immediate Anesthesia Transfer of Care Note  Patient: [name] [name]  Procedure(s) Performed: TRANSESOPHAGEAL ECHOCARDIOGRAM (TEE) (N/A )  Patient Location: Endoscopy Unit  Anesthesia Type:MAC  Level of Consciousness: drowsy and patient cooperative  Airway & Oxygen Therapy: Patient Spontanous Breathing and Patient connected to nasal cannula oxygen  Post-op Assessment: Report given to RN, Post -op Vital signs reviewed and stable and Patient moving all extremities X 4  Post vital signs: Reviewed and stable  Last Vitals:  Vitals Value Taken Time  BP 116/74 12/05/19 1227  Temp    Pulse 112 12/05/19 1227  Resp 20 12/05/19 1227  SpO2 96 % 12/05/19 1227    Last Pain:  Vitals:   12/05/19 1227  TempSrc:   PainSc: 0-No pain      Patients Stated Pain Goal: 2 (66/81/59 4707)  Complications: No apparent anesthesia complications

## 2019-12-05 NOTE — Anesthesia Postprocedure Evaluation (Signed)
Anesthesia Post Note  Patient: Dakari Stabler  Procedure(s) Performed: TRANSESOPHAGEAL ECHOCARDIOGRAM (TEE) (N/A )     Patient location during evaluation: Endoscopy Anesthesia Type: MAC Level of consciousness: awake and alert Pain management: pain level controlled Vital Signs Assessment: post-procedure vital signs reviewed and stable Respiratory status: spontaneous breathing, nonlabored ventilation, respiratory function stable and patient connected to nasal cannula oxygen Cardiovascular status: stable and blood pressure returned to baseline Postop Assessment: no apparent nausea or vomiting Anesthetic complications: no    Last Vitals:  Vitals:   12/05/19 1240 12/05/19 1247  BP: 109/66 127/82  Pulse: (!) 104 (!) 101  Resp: (!) 25 19  Temp:    SpO2: 97% 98%    Last Pain:  Vitals:   12/05/19 1240  TempSrc:   PainSc: 0-No pain                 Catalina Gravel

## 2019-12-05 NOTE — Anesthesia Preprocedure Evaluation (Signed)
Anesthesia Evaluation  Patient identified by MRN, date of birth, ID band Patient awake    Reviewed: Allergy & Precautions, NPO status , Patient's Chart, lab work & pertinent test results, reviewed documented beta blocker date and time   Airway Mallampati: II  TM Distance: >3 FB Neck ROM: Full    Dental  (+) Teeth Intact, Dental Advisory Given   Pulmonary neg pulmonary ROS,    Pulmonary exam normal breath sounds clear to auscultation       Cardiovascular hypertension, Pt. on medications and Pt. on home beta blockers Normal cardiovascular exam Rhythm:Regular Rate:Normal  Right atrial mass   Neuro/Psych  Headaches, PSYCHIATRIC DISORDERS Anxiety Depression    GI/Hepatic negative GI ROS, Neg liver ROS,   Endo/Other  Morbid obesity  Renal/GU negative Renal ROS     Musculoskeletal negative musculoskeletal ROS (+)   Abdominal   Peds  Hematology  (+) Blood dyscrasia, anemia ,   Anesthesia Other Findings Day of surgery medications reviewed with the patient.  Reproductive/Obstetrics S/p SVD 12/02/2019                             Anesthesia Physical Anesthesia Plan  ASA: III  Anesthesia Plan: MAC   Post-op Pain Management:    Induction: Intravenous  PONV Risk Score and Plan: 2 and Propofol infusion and Treatment may vary due to age or medical condition  Airway Management Planned: Nasal Cannula  Additional Equipment:   Intra-op Plan:   Post-operative Plan:   Informed Consent: I have reviewed the patients History and Physical, chart, labs and discussed the procedure including the risks, benefits and alternatives for the proposed anesthesia with the patient or authorized representative who has indicated his/her understanding and acceptance.     Dental advisory given  Plan Discussed with: CRNA and Anesthesiologist  Anesthesia Plan Comments: (Discussed risks/benefits/alternatives to MAC  sedation including need for ventilatory support, hypotension, need for conversion to general anesthesia.  All patient questions answered.  Patient/guardian wishes to proceed.)        Anesthesia Quick Evaluation

## 2019-12-05 NOTE — CV Procedure (Signed)
    TRANSESOPHAGEAL ECHOCARDIOGRAM   NAME:  [name] [name]   MRN: [id number] DOB:  11-15-1986   ADMIT DATE: 11/30/2019  INDICATIONS: RA mass  PROCEDURE:   Informed consent was obtained prior to the procedure. The risks, benefits and alternatives for the procedure were discussed and the patient comprehended these risks.  Risks include, but are not limited to, cough, sore throat, vomiting, nausea, somnolence, esophageal and stomach trauma or perforation, bleeding, low blood pressure, aspiration, pneumonia, infection, trauma to the teeth and death.    Procedural time out performed. The oropharynx was anesthetized with topical 1% benzocaine.    Anesthesia was administered by Dr Gifford Shave and Richardean Canal, CRNA.  The transesophageal probe was inserted in the esophagus and stomach without difficulty and multiple views were obtained.    COMPLICATIONS:    There were no immediate complications.  FINDINGS:  LEFT VENTRICLE: EF = 60-65%.  No regional wall motion abnormalities.  RIGHT VENTRICLE: Normal size and function.   LEFT ATRIUM: No thrombus/mass.  LEFT ATRIAL APPENDAGE: No thrombus/mass.   RIGHT ATRIUM: There is a 1.6cm x 1.1cm mobile, round, echogenic mass located at the junction of the IVC and right atrium. 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 While IVC/RA junction would be unusual location for a myxoma, there have been case reports of myxomas at this location. Differential includes thrombus. Could consider cardiac MRI for further evaluation.  AORTIC VALVE:  Trileaflet. No regurgitation. No vegetation.  MITRAL VALVE:    Normal structure. No regurgitation. No vegetation.  TRICUSPID VALVE: Normal structure. No regurgitation. No vegetation.  PULMONIC VALVE: Grossly normal structure. No regurgitation. No apparent vegetation.  INTERATRIAL SEPTUM: No PFO or ASD seen by color Doppler.  PERICARDIUM: No effusion noted.  AORTA: normal  aortic root size  CONCLUSION: There is a 1.6cm x 1.1cm mobile, round, echogenic mass located at the junction of the IVC and right atrium. 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 While IVC/RA junction would be unusual location for a myxoma, there have been case reports of myxomas at this location. Differential includes thrombus. Could consider cardiac MRI for further evaluation.  Oswaldo Milian MD [facility]  [address] [place], [place] [address] [phone]   9:07 PM

## 2019-12-05 NOTE — Progress Notes (Signed)
CSW received phone call from [place] with [employer] CPS stating a CFT has been scheduled for tomorrow, 01/05, at 1pm. CSW to participate in meeting and await disposition.  Elijio Miles, LCSW [facility] [employer] [phone]

## 2019-12-05 NOTE — Progress Notes (Signed)
  Echocardiogram Echocardiogram Transesophageal has been performed.  Oneal Deputy Deedee Lybarger 12/05/2019, 12:43 PM

## 2019-12-05 NOTE — Interval H&P Note (Signed)
History and Physical Interval Note:  12/05/2019 11:39 AM  [name] [name]  has presented today for surgery, with the diagnosis of right atrial mass.  The various methods of treatment have been discussed with the patient and family. After consideration of risks, benefits and other options for treatment, the patient has consented to  Procedure(s): TRANSESOPHAGEAL ECHOCARDIOGRAM (TEE) (N/A) as a surgical intervention.  The patient's history has been reviewed, patient examined, no change in status, stable for surgery.  I have reviewed the patient's chart and labs.  Questions were answered to the patient's satisfaction.     Donato Heinz

## 2019-12-05 NOTE — Progress Notes (Signed)
Patient ID: [name] [name], female   DOB: 01-Jul-1986, 34 y.o.   MRN: [id number]    POSTPARTUM PROGRESS NOTE  Post Partum Day 4  Subjective:  [name] [name] is a 34 y.o. [id number] s/p VBAC at [redacted]w[redacted]d.  Patient reports having an episode of chest pain at around 10pm yesterday evening that lasted for a couple of minutes. She reports she was lying down when she felt some pressure. It went away on its own. It has not happened since. It has never happened to her before. She denies chest pain at this time.  Pt denies problems with ambulating, voiding or po intake.  She denies nausea or vomiting.  Pain is well controlled.  She has had flatus. She has had bowel movement.  Lochia Minimal.   Objective: Blood pressure (!) 137/93, pulse 93, temperature 98 F (36.7 C), temperature source Oral, resp. rate 18, height 5\' 2"  (1.575 m), weight 113.4 kg, last menstrual period 03/02/2019, SpO2 100 %, unknown if currently breastfeeding.  Physical Exam:  General: alert, cooperative and no distress Chest: no respiratory distress Heart:regular rate, distal pulses intact Abdomen: soft, nontender,  Uterine Fundus: firm, appropriately tender DVT Evaluation: No calf swelling or tenderness Extremities: No edema Skin: warm, dry  No results for input(s): HGB, HCT in the last 72 hours.  Assessment/Plan: [name] [name] is a 34 y.o. [phone] s/p VBAC at [redacted]w[redacted]d   PPD# 4 - Doing well Contraception: Depo Feeding: Breast  Dispo: Barriers to discharge per SW - awaiting CPS as patient has children in DSS custody.   #right atrial myxoma Non compliant with anticoag during pregnancy   TEE at noon today   Follow up cards recs    #chronic HTN Bps mostly in 140's-150's/80-90's. Consider addition of amlodipine, HCTZ (50mg ),   Continue labetalol 150 mg BID   Continue to monitor  #Reports of CP Currently stable with no repeated episodes since 10 pm. Patient is aware to let nurse know if she experiences any further chest  pain.   If repeat chest pain, EKG and Troponins.   # Social Work/DC Barriers  Open CPS case with other children in DSS custody   F/u CPS dispo plans   Currently NOT cleared for discharge     LOS: 5 days   Wilber Oliphant, MD, 12/05/2019, 6:47 AM

## 2019-12-05 NOTE — Lactation Note (Signed)
This note was copied from a baby's chart. Lactation Consultation Note  Patient Name: [name] [name] [id number] Date: 12/05/2019 Reason for consult: Follow-up assessment;Infant weight loss;Other (Comment);Term(6 % weight loss)  Baby is 44 hours old / Bili at 75 hours 15.9/ per Pedis serum Bilirubin ordered for this am.  As LC entered the mom sound asleep with baby next her side wit both side rails  Elevated. LC gently woke mom up out of a sound sleep.  LC explained to mom it is not safe to sleep with the baby.  Mom fully awake and having a conversation with [place] and the MD examining the  Baby.  Per mom breast are fuller, and baby last fed at 8 am for 34 mins with lots of swallows and the feeding was comfortable. Mom denies sore nipples.  Mom has a DEBP set up and a hand pump.  Delayed D/C today to SW issues, Pending Bili and mom having tests.     Maternal Data    Feeding Feeding Type: (per mom baby recently fed at 8 am for 34 mins)  LATCH Score                   Interventions Interventions: Breast feeding basics reviewed  Lactation Tools Discussed/Used WIC Program: Yes   Consult Status Consult Status: Follow-up Date: 12/05/19 Follow-up type: In-patient    [place] 12/05/2019, 9:16 AM

## 2019-12-06 ENCOUNTER — Encounter: Payer: Medicaid Other | Admitting: Thoracic Surgery (Cardiothoracic Vascular Surgery)

## 2019-12-07 ENCOUNTER — Encounter: Payer: Self-pay | Admitting: *Deleted

## 2019-12-07 ENCOUNTER — Encounter: Payer: Medicaid Other | Admitting: Thoracic Surgery (Cardiothoracic Vascular Surgery)

## 2019-12-09 ENCOUNTER — Ambulatory Visit: Payer: Medicaid Other

## 2019-12-27 IMAGING — US US OB TRANSVAGINAL
1 series · 15 of 28 positions shown · non-contrast
Comparison: None for this gestation

CLINICAL DATA: Bleeding in early pregnancy

EXAM:
US OB TRANSVAGINAL

[Series 1: us ob transvaginal · 15 of 41 slices shown]
[im 1/41]
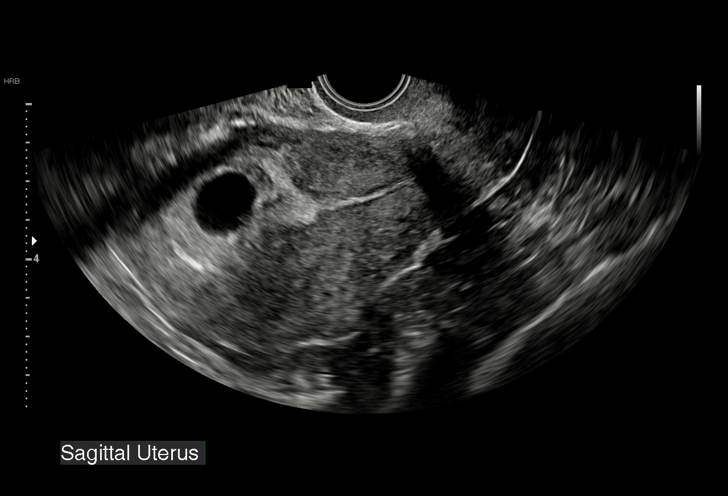
[im 3/41]
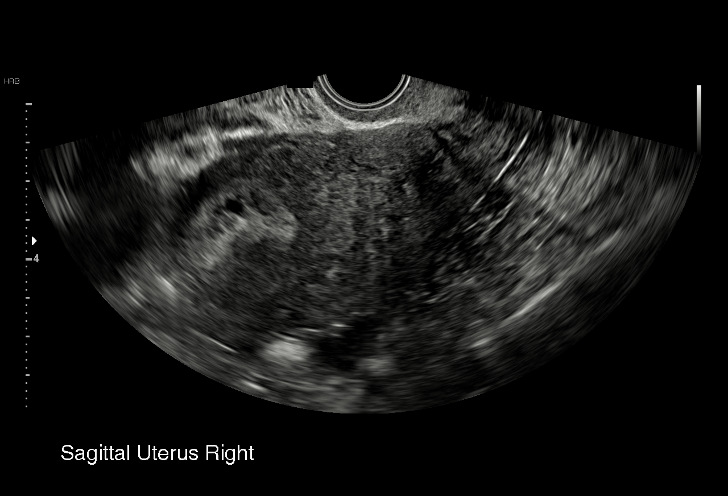
[im 6/41]
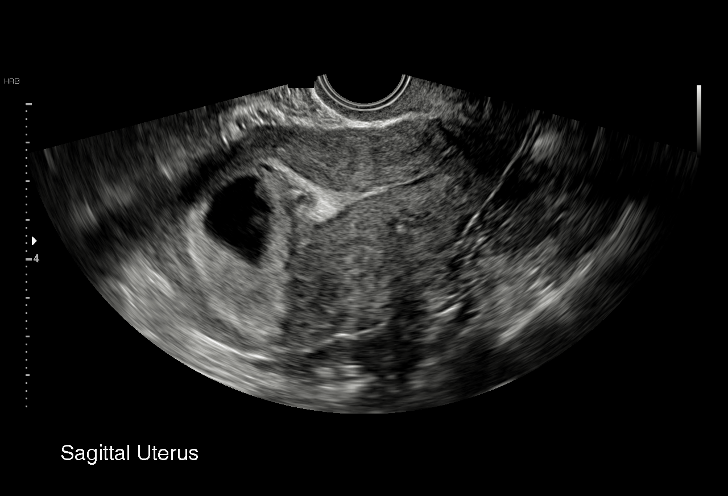
[im 9/41]
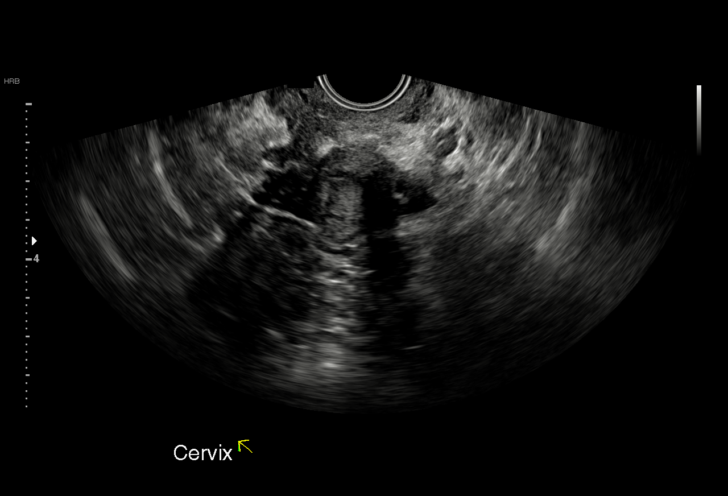
[im 12/41]
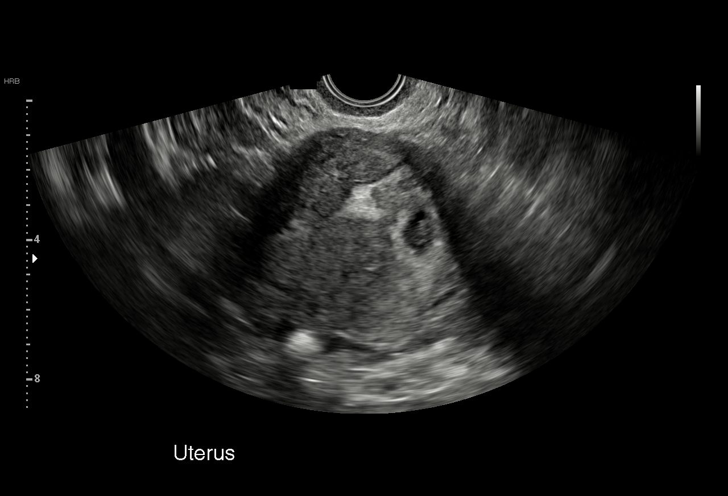
[im 15/41]
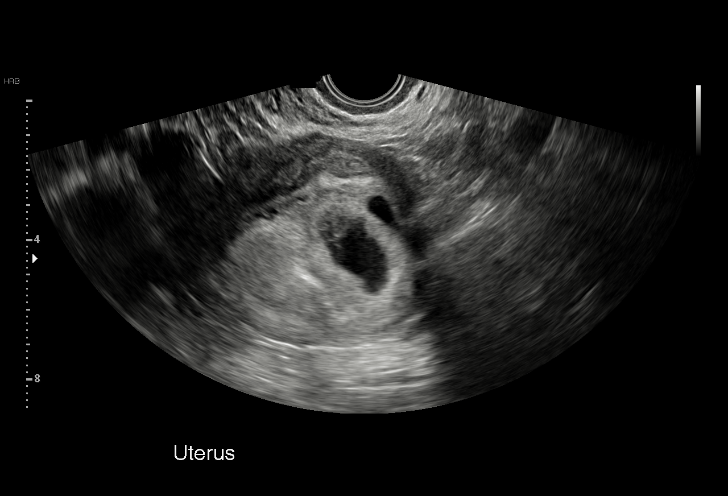
[im 18/41]
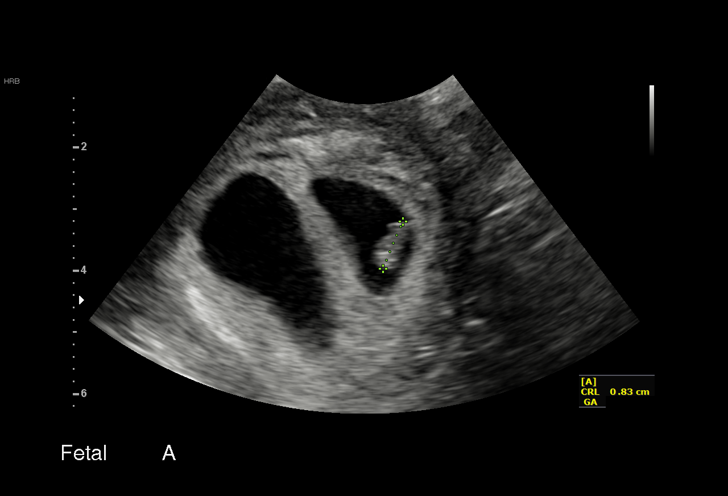
[im 21/41]
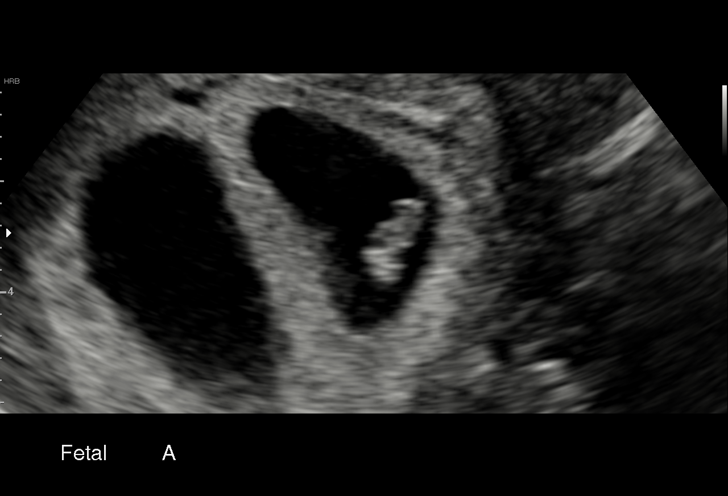
[im 23/41]
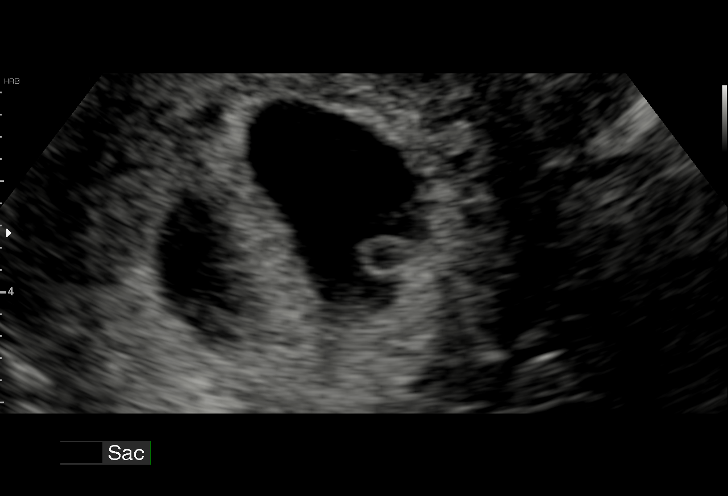
[im 26/41]
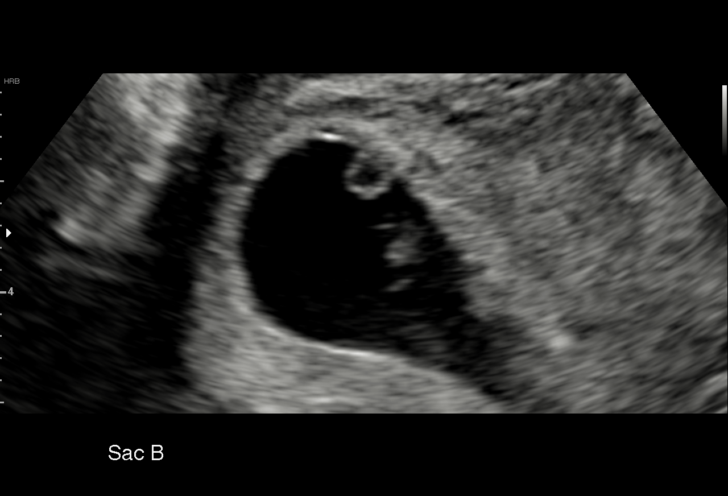
[im 29/41]
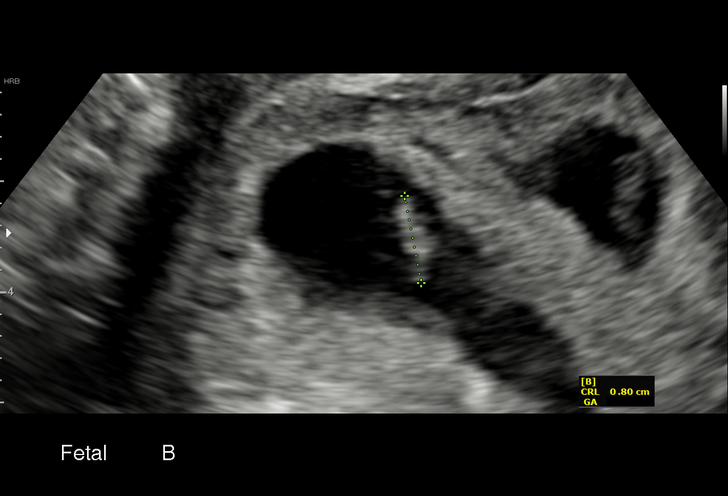
[im 32/41]
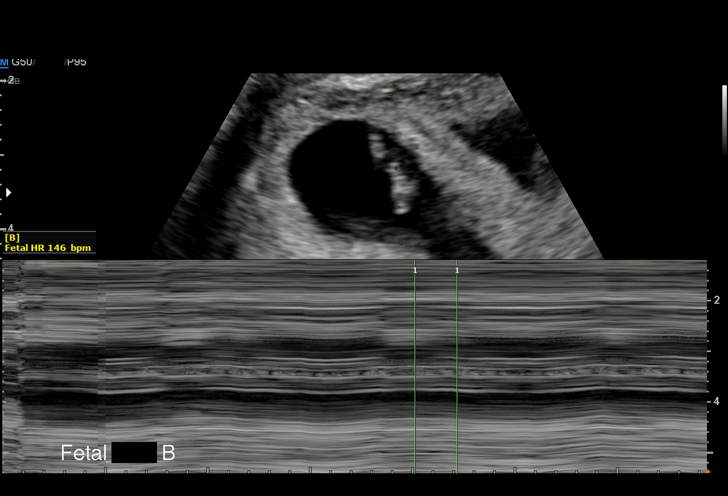
[im 35/41]
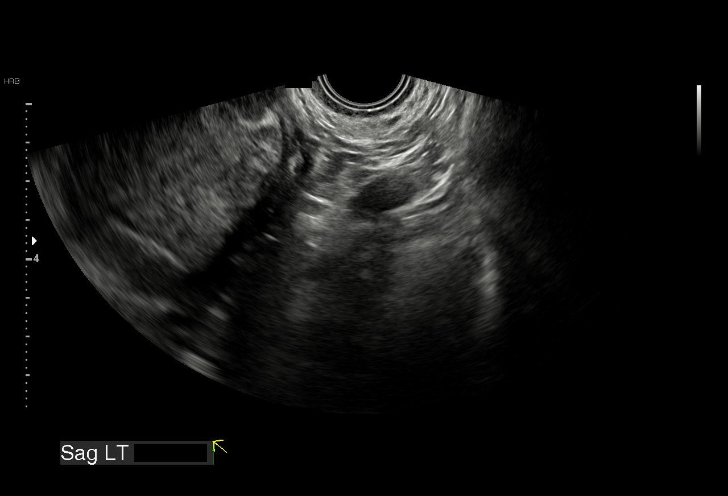
[im 38/41]
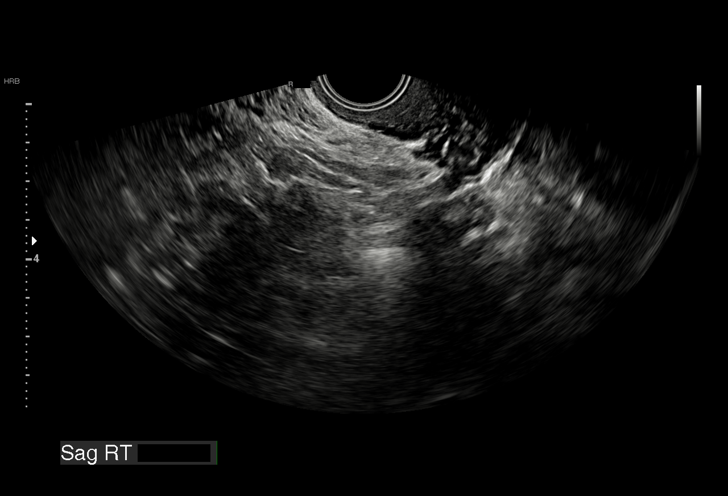
[im 41/41]
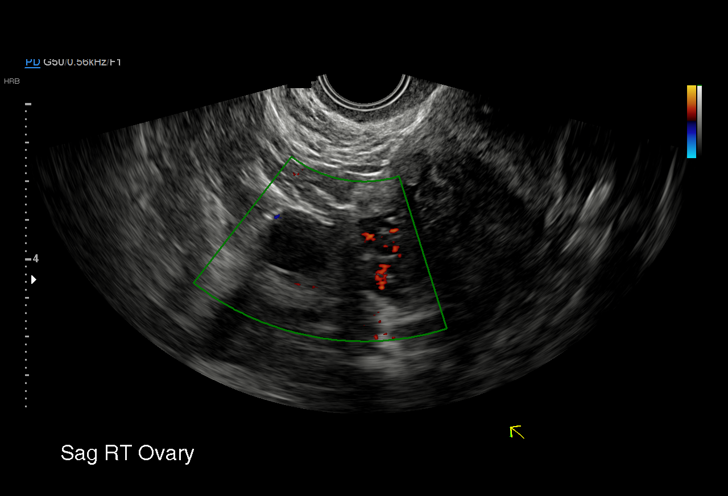

[15 of 28 positions shown; findings below may reference images not displayed]

FINDINGS: Number of IUPs:  2

Chorionicity/Amnionicity:  Dichorionic-diamniotic (thick membrane)

TWIN 1/A

Yolk sac:  Present

Embryo:  Present

Cardiac Activity: Present

Heart Rate: 130 bpm

CRL:  8.7 mm   6 w 5 d                  US EDC: 11/07/2018

TWIN 2/B

Yolk sac:  Present

Embryo:  Present

Cardiac Activity: Present

Heart Rate: 146 bpm

CRL:  7.5 mm   6 w 4 d                  US EDC: 11/08/2018

Subchorionic hemorrhage:  Small subchorionic hemorrhage visualize

Maternal uterus/adnexae:

LEFT ovary not visualized.

RIGHT ovary measures 3.3 x 1.9 x 2.7 cm and contains a small corpus
luteal cyst.

No free pelvic fluid or adnexal masses.
IMPRESSION: Live twin intrauterine pregnancy as above.

Small subchronic hemorrhage.

## 2020-01-02 ENCOUNTER — Encounter: Payer: Medicaid Other | Admitting: Thoracic Surgery (Cardiothoracic Vascular Surgery)

## 2020-01-05 ENCOUNTER — Ambulatory Visit: Payer: Medicaid Other | Admitting: Obstetrics and Gynecology

## 2020-01-05 ENCOUNTER — Encounter: Payer: Self-pay | Admitting: Family Medicine

## 2020-01-13 ENCOUNTER — Ambulatory Visit (INDEPENDENT_AMBULATORY_CARE_PROVIDER_SITE_OTHER): Payer: Medicaid Other | Admitting: Family Medicine

## 2020-01-13 ENCOUNTER — Other Ambulatory Visit (HOSPITAL_COMMUNITY)
Admission: RE | Admit: 2020-01-13 | Discharge: 2020-01-13 | Disposition: A | Discharge status: Home / Self Care | Payer: Medicaid Other | Source: Ambulatory Visit | Attending: Family Medicine | Admitting: Family Medicine

## 2020-01-13 ENCOUNTER — Other Ambulatory Visit: Payer: Self-pay

## 2020-01-13 ENCOUNTER — Encounter: Payer: Self-pay | Admitting: Family Medicine

## 2020-01-13 VITALS — BP 116/88 | HR 77 | Wt 232.4 lb

## 2020-01-13 DIAGNOSIS — I10 Essential (primary) hypertension: Secondary | ICD-10-CM

## 2020-01-13 DIAGNOSIS — N898 Other specified noninflammatory disorders of vagina: Secondary | ICD-10-CM | POA: Diagnosis not present

## 2020-01-13 DIAGNOSIS — Z87898 Personal history of other specified conditions: Secondary | ICD-10-CM

## 2020-01-13 DIAGNOSIS — N912 Amenorrhea, unspecified: Secondary | ICD-10-CM

## 2020-01-13 DIAGNOSIS — Z1331 Encounter for screening for depression: Secondary | ICD-10-CM

## 2020-01-13 DIAGNOSIS — D151 Benign neoplasm of heart: Secondary | ICD-10-CM

## 2020-01-13 LAB — POCT PREGNANCY, URINE: Preg Test, Ur: NEGATIVE

## 2020-01-13 NOTE — Assessment & Plan Note (Signed)
Read her UpToDate recommendations and risks or untreated Myxoma. Advised to seek CT surgery f/u.
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
## 2020-01-13 NOTE — Assessment & Plan Note (Signed)
To see Jamie--advised of risks

## 2020-01-13 NOTE — Assessment & Plan Note (Signed)
On no meds, but BP is ok today

## 2020-01-13 NOTE — Patient Instructions (Signed)
If you are in need of transportation to get to and from your appointments in our office.  You can reach Transportation Services by calling [phone] Monday - Friday  7am-6pm.

## 2020-01-14 LAB — BETA HCG QUANT (REF LAB): hCG Quant: <1 m[IU]/mL

## 2020-01-16 ENCOUNTER — Other Ambulatory Visit: Payer: Self-pay | Admitting: Family Medicine

## 2020-01-16 LAB — CERVICOVAGINAL ANCILLARY ONLY
Bacterial Vaginitis (gardnerella): POSITIVE — AB
Candida Glabrata: NEGATIVE
Candida Vaginitis: NEGATIVE
Chlamydia: NEGATIVE
Comment: NEGATIVE
Comment: NEGATIVE
Comment: NEGATIVE
Comment: NEGATIVE
Comment: NEGATIVE
Comment: NORMAL
Neisseria Gonorrhea: NEGATIVE
Trichomonas: NEGATIVE

## 2020-01-16 MED ORDER — METRONIDAZOLE 500 MG PO TABS: 500.0000 mg | ORAL_TABLET | Freq: Two times a day (BID) | ORAL | 0 refills | Status: DC

## 2020-01-16 NOTE — Progress Notes (Signed)
BV on wet prep--Rx sent in.

## 2020-01-27 ENCOUNTER — Ambulatory Visit: Payer: Medicaid Other

## 2020-01-31 ENCOUNTER — Encounter: Payer: Self-pay | Admitting: Thoracic Surgery (Cardiothoracic Vascular Surgery)

## 2020-01-31 ENCOUNTER — Encounter: Payer: Medicaid Other | Admitting: Thoracic Surgery (Cardiothoracic Vascular Surgery)

## 2020-01-31 ENCOUNTER — Other Ambulatory Visit: Payer: Self-pay

## 2020-01-31 VITALS — BP 122/80 | HR 76 | Temp 97.7°F | Resp 16 | Ht 62.0 in | Wt 232.8 lb

## 2020-02-01 ENCOUNTER — Encounter: Payer: Self-pay | Admitting: Thoracic Surgery (Cardiothoracic Vascular Surgery)

## 2020-02-01 ENCOUNTER — Encounter: Payer: Medicaid Other | Admitting: Thoracic Surgery (Cardiothoracic Vascular Surgery)

## 2020-02-01 VITALS — Ht 62.0 in | Wt 232.0 lb

## 2020-02-01 NOTE — Progress Notes (Signed)
  This encounter was created in error - please disregard.  Patient did not show up for appointment

## 2020-02-01 NOTE — Patient Instructions (Signed)
Continue all previous medications without any changes at this time  

## 2020-02-01 NOTE — Progress Notes (Signed)
This encounter was created in error - please disregard.

## 2020-02-13 ENCOUNTER — Encounter: Payer: Medicaid Other | Admitting: Thoracic Surgery (Cardiothoracic Vascular Surgery)

## 2020-02-17 ENCOUNTER — Other Ambulatory Visit: Payer: Self-pay

## 2020-02-17 ENCOUNTER — Emergency Department (HOSPITAL_COMMUNITY)
Admission: EM | Admit: 2020-02-17 | Discharge: 2020-02-17 | Disposition: A | Discharge status: Home / Self Care | Payer: Medicaid Other | Attending: Emergency Medicine | Admitting: Emergency Medicine

## 2020-02-17 ENCOUNTER — Emergency Department (HOSPITAL_COMMUNITY): Payer: Medicaid Other

## 2020-02-17 ENCOUNTER — Encounter (HOSPITAL_COMMUNITY): Payer: Self-pay | Admitting: Emergency Medicine

## 2020-02-17 DIAGNOSIS — R1013 Epigastric pain: Secondary | ICD-10-CM | POA: Diagnosis not present

## 2020-02-17 DIAGNOSIS — F70 Mild intellectual disabilities: Secondary | ICD-10-CM | POA: Insufficient documentation

## 2020-02-17 DIAGNOSIS — I10 Essential (primary) hypertension: Secondary | ICD-10-CM | POA: Insufficient documentation

## 2020-02-17 DIAGNOSIS — R0789 Other chest pain: Secondary | ICD-10-CM | POA: Diagnosis present

## 2020-02-17 DIAGNOSIS — Z113 Encounter for screening for infections with a predominantly sexual mode of transmission: Secondary | ICD-10-CM | POA: Diagnosis not present

## 2020-02-17 DIAGNOSIS — Z79899 Other long term (current) drug therapy: Secondary | ICD-10-CM | POA: Diagnosis not present

## 2020-02-17 DIAGNOSIS — Z8616 Personal history of COVID-19: Secondary | ICD-10-CM | POA: Insufficient documentation

## 2020-02-17 LAB — BASIC METABOLIC PANEL
Anion gap: 11 (ref 5–15)
BUN: 15 mg/dL (ref 6–20)
CO2: 25 mmol/L (ref 22–32)
Calcium: 8.9 mg/dL (ref 8.9–10.3)
Chloride: 104 mmol/L (ref 98–111)
Creatinine, Ser: 1.11 mg/dL — ABNORMAL HIGH (ref 0.44–1.00)
GFR calc Af Amer: >60 mL/min (ref >60)
GFR calc non Af Amer: >60 mL/min (ref >60)
Glucose, Bld: 84 mg/dL (ref 70–99)
Potassium: 4 mmol/L (ref 3.5–5.1)
Sodium: 140 mmol/L (ref 135–145)

## 2020-02-17 LAB — CBC
HCT: 39 % (ref 36.0–46.0)
Hemoglobin: 12.4 g/dL (ref 12.0–15.0)
MCH: 31.2 pg (ref 26.0–34.0)
MCHC: 31.8 g/dL (ref 30.0–36.0)
MCV: 98 fL (ref 80.0–100.0)
Platelets: 371 10*3/uL (ref 150–400)
RBC: 3.98 MIL/uL (ref 3.87–5.11)
RDW: 14.6 % (ref 11.5–15.5)
WBC: 5.7 10*3/uL (ref 4.0–10.5)
nRBC: 0 % (ref 0.0–0.2)

## 2020-02-17 LAB — RAPID HIV SCREEN (HIV 1/2 AB+AG)
HIV 1/2 Antibodies: NONREACTIVE
HIV-1 P24 Antigen - HIV24: NONREACTIVE

## 2020-02-17 LAB — HEPATIC FUNCTION PANEL
ALT: 29 U/L (ref 0–44)
AST: 16 U/L (ref 15–41)
Albumin: 3.6 g/dL (ref 3.5–5.0)
Alkaline Phosphatase: 52 U/L (ref 38–126)
Bilirubin, Direct: <0.1 mg/dL (ref 0.0–0.2)
Total Bilirubin: 0.3 mg/dL (ref 0.3–1.2)
Total Protein: 6.8 g/dL (ref 6.5–8.1)

## 2020-02-17 LAB — WET PREP, GENITAL
Clue Cells Wet Prep HPF POC: NONE SEEN
Sperm: NONE SEEN
Trich, Wet Prep: NONE SEEN
Yeast Wet Prep HPF POC: NONE SEEN

## 2020-02-17 LAB — I-STAT BETA HCG BLOOD, ED (MC, WL, AP ONLY): I-stat hCG, quantitative: <5 m[IU]/mL (ref <5)

## 2020-02-17 LAB — TROPONIN I (HIGH SENSITIVITY)
Troponin I (High Sensitivity): 4 ng/L (ref <18)
Troponin I (High Sensitivity): 3 ng/L (ref <18)

## 2020-02-17 LAB — LIPASE, BLOOD: Lipase: 39 U/L (ref 11–51)

## 2020-02-17 MED ORDER — ACETAMINOPHEN 325 MG PO TABS
650.0000 mg | ORAL_TABLET | Freq: Once | ORAL | Status: AC
  Administered 2020-02-17: 650 mg via ORAL
  Filled 2020-02-17: qty 2
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
## 2020-02-17 NOTE — ED Notes (Signed)
Patient verbalizes understanding of discharge instructions. Opportunity for questioning and answers were provided. Armband removed by staff, pt discharged from ED to home via POV  

## 2020-02-17 NOTE — ED Triage Notes (Signed)
Pt reports generalized chest pain Wednesday night to Thursday am. Denies N/V, SOB. Pain has improved today.

## 2020-02-17 NOTE — ED Notes (Signed)
Pt discharged, removed from system, almost to lobby when requested work note. Returned and provided work note per this request.

## 2020-02-21 LAB — GC/CHLAMYDIA PROBE AMP (~~LOC~~) NOT AT ARMC
Chlamydia: NEGATIVE
Neisseria Gonorrhea: NEGATIVE

## 2020-02-27 ENCOUNTER — Ambulatory Visit: Payer: Medicaid Other | Admitting: Clinical

## 2020-02-27 ENCOUNTER — Encounter: Payer: Medicaid Other | Admitting: Thoracic Surgery (Cardiothoracic Vascular Surgery)

## 2020-02-27 DIAGNOSIS — Z5329 Procedure and treatment not carried out because of patient's decision for other reasons: Secondary | ICD-10-CM

## 2020-02-27 DIAGNOSIS — Z91199 Patient's noncompliance with other medical treatment and regimen due to unspecified reason: Secondary | ICD-10-CM

## 2020-02-27 NOTE — BH Specialist Note (Signed)
Pt did not arrive to video visit and did not answer the phone ; Left HIPPA-compliant message to call back [name] [name] from [facility] at [phone].  ; left MyChart message for patient.    [name] [name] via Telemedicine Video Visit  02/27/2020 [name] [name] [id number]   [name] [name]

## 2020-04-22 IMAGING — US US MFM OB FOLLOW-UP EACH ADDL GEST (MODIFY)
1 series · 14 of 28 positions shown · non-contrast
Comparison: none

[Series 1: us mfm ob follow-up each addl gest (modify) · 14 of 101 slices shown]
[im 4/101]
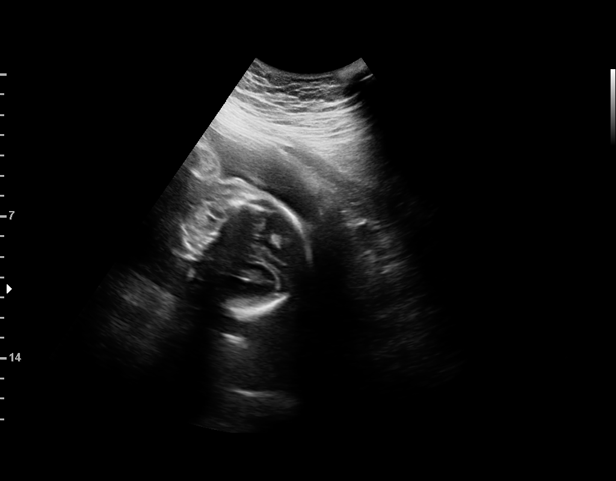
[im 12/101]
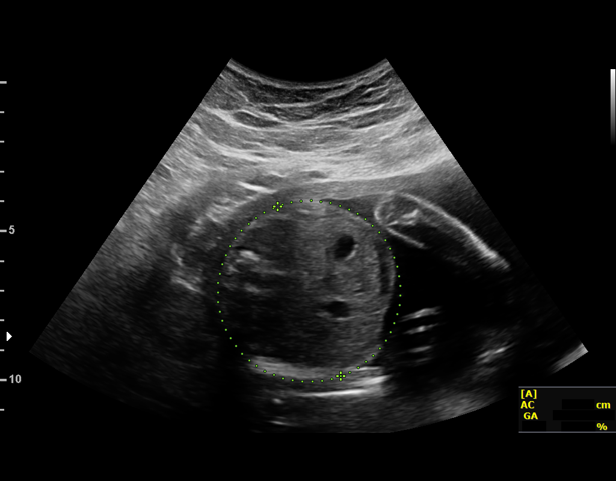
[im 19/101]
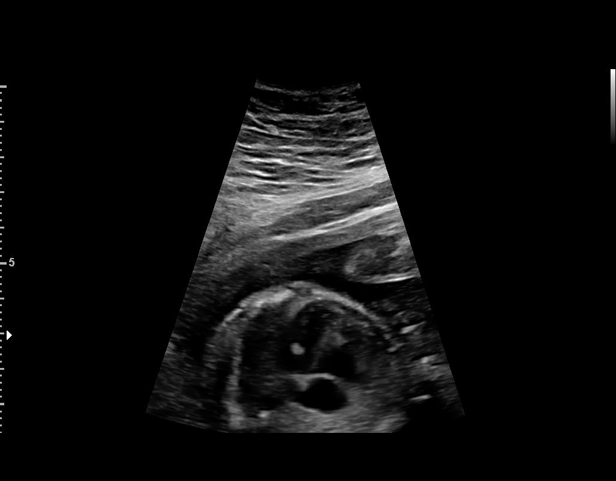
[im 26/101]
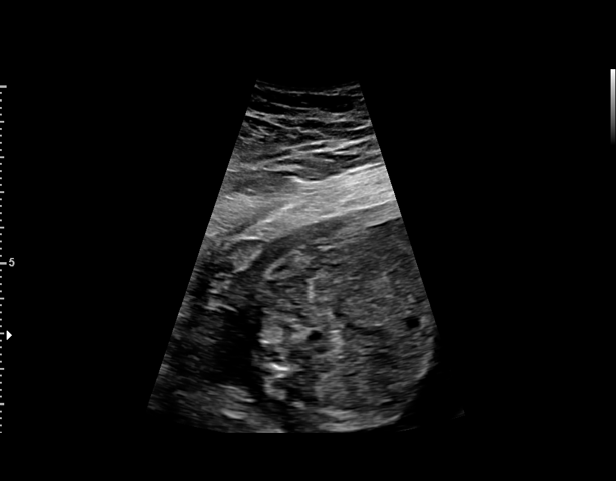
[im 34/101]
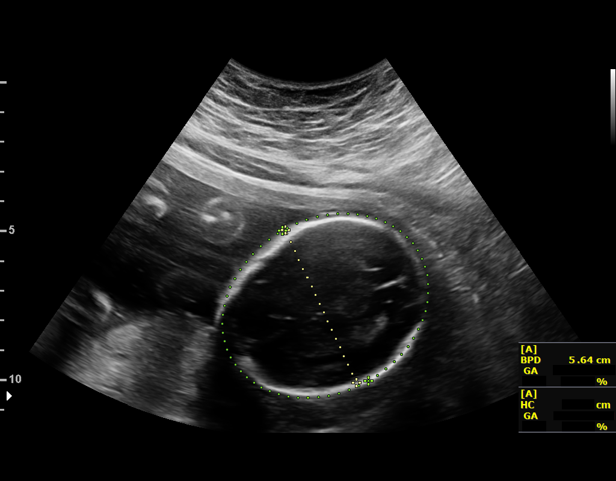
[im 41/101]
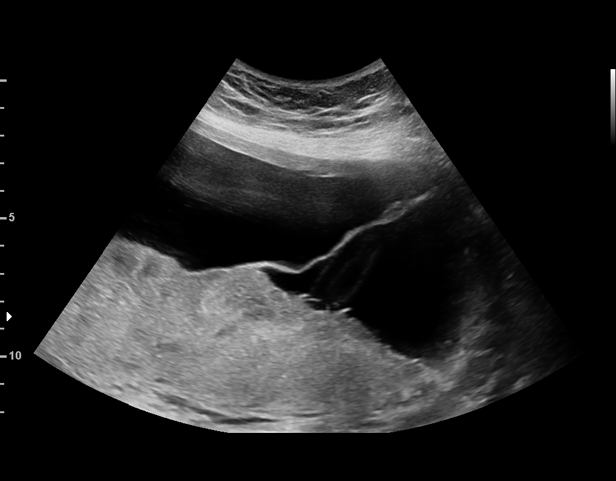
[im 49/101]
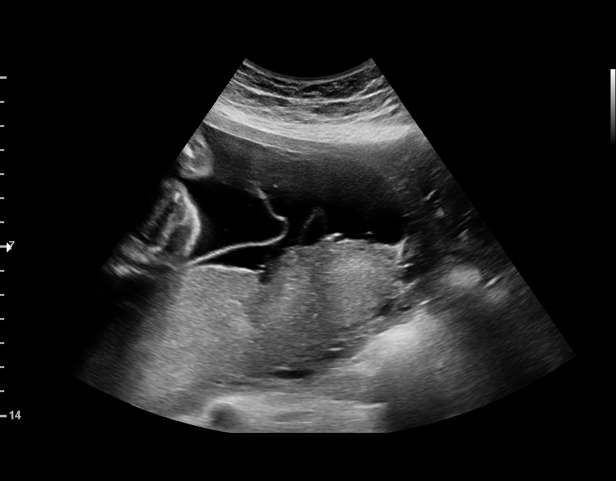
[im 56/101]
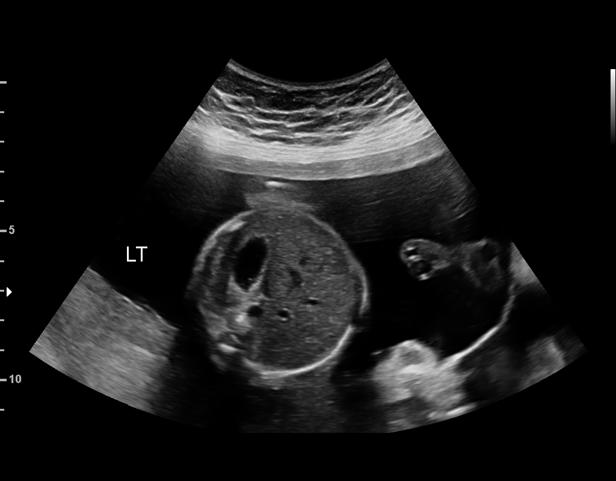
[im 63/101]
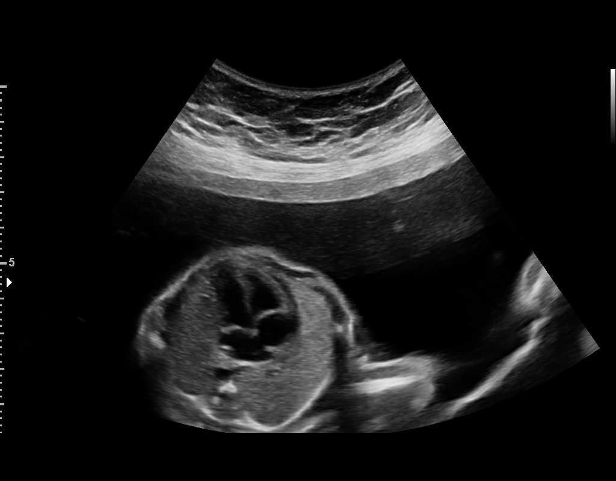
[im 71/101]
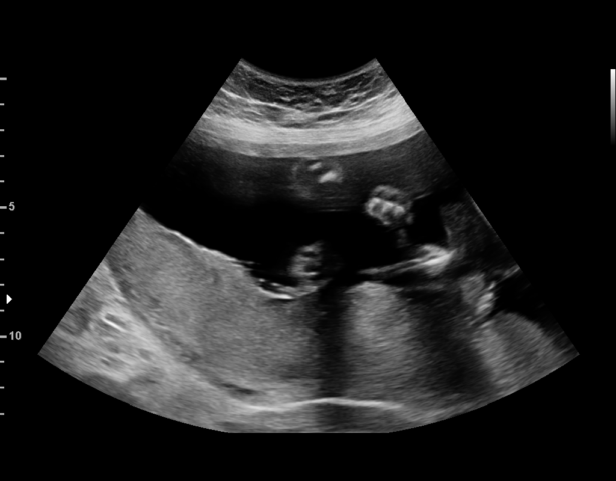
[im 78/101]
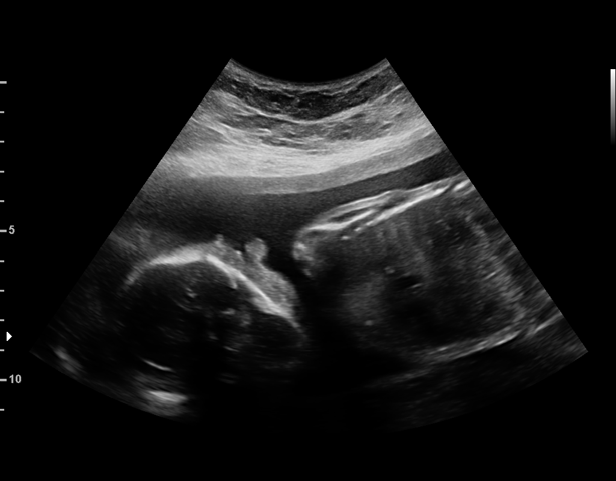
[im 86/101]
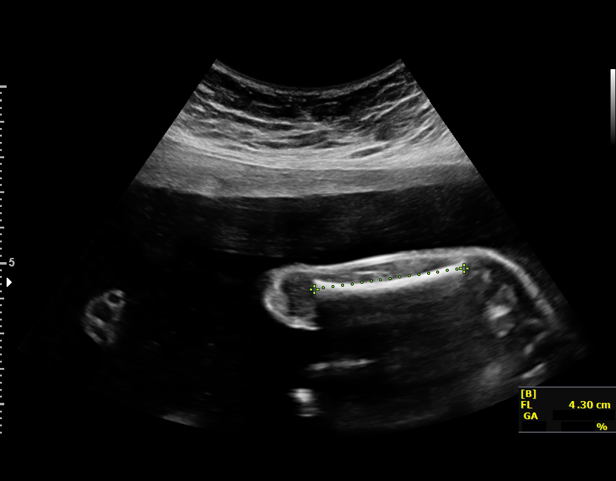
[im 93/101]
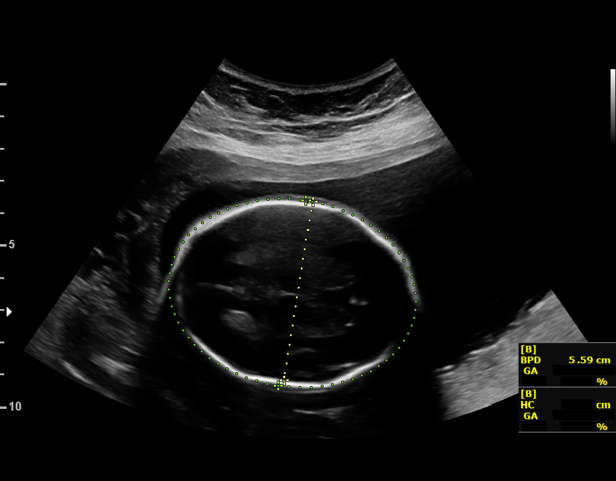
[im 101/101]
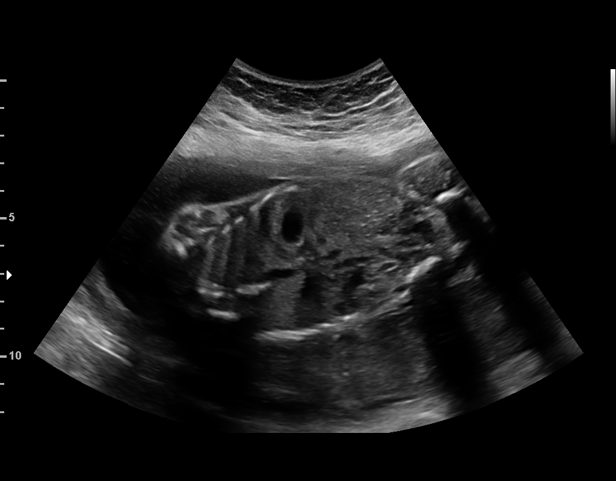

[14 of 28 positions shown; findings below may reference images not displayed]

OB/Gyn Clinic

Indications

Hypertension - Chronic/Pre-existing (ASA)
Rh negative state in antepartum
Previous cesarean delivery, antepartum (4
subsequent VBACs)
Obesity complicating pregnancy, second
trimester (BMI 43)
23 weeks gestation of pregnancy
OB History

Gravidity:    6         Term:   5        Prem:   0        SAB:   0
TOP:          0       Ectopic:  0        Living: 5
Fetal Evaluation (Fetus A)

Num Of Fetuses:     2
Fetal Heart         146
Rate(bpm):
Cardiac Activity:   Observed
Fetal Lie:          Lower Fetus
Presentation:       Variable
Placenta:           Posterior
P. Cord Insertion:  Previously Visualized
Membrane Desc:      Dividing Membrane seen - Dichorionic.
Amniotic Fluid
AFI FV:      Within normal limits

Largest Pocket(cm)
5.0
Biometry (Fetus A)

BPD:        56  mm     G. Age:  23w 1d         37  %    CI:        72.37   %    70 - 86
FL/HC:      19.3   %    19.2 -
HC:      209.4  mm     G. Age:  23w 0d         25  %    HC/AC:      1.09        1.05 -
AC:      192.2  mm     G. Age:  23w 6d         62  %    FL/BPD:     72.3   %    71 - 87
FL:       40.5  mm     G. Age:  23w 1d         33  %    FL/AC:      21.1   %    20 - 24

Est. FW:     600  gm      1 lb 5 oz     56  %     FW Discordancy         4  %
Gestational Age (Fetus A)

LMP:           23w 2d        Date:  02/01/18                 EDD:   11/08/18
U/S Today:     23w 2d                                        EDD:   11/08/18
Best:          23w 2d     Det. By:  LMP  (02/01/18)          EDD:   11/08/18
Anatomy (Fetus A)

Cranium:               Appears normal         Aortic Arch:            Previously seen
Cavum:                 Appears normal         Ductal Arch:            Previously seen
Ventricles:            Appears normal         Diaphragm:              Appears normal
Choroid Plexus:        Previously seen        Stomach:                Appears normal, left
sided
Cerebellum:            Previously seen        Abdomen:                Appears normal
Posterior Fossa:       Previously seen        Abdominal Wall:         Appears nml (cord
insert, abd wall)
Nuchal Fold:           Previously seen        Cord Vessels:           Previously seen
Face:                  Orbits and profile     Kidneys:                Appear normal
previously seen
Lips:                  Appears normal         Bladder:                Appears normal
Thoracic:              Appears normal         Spine:                  Previously seen
Heart:                 Echogenic focus        Upper Extremities:      Previously seen
in LV
RVOT:                  Previously seen        Lower Extremities:      Previously seen
LVOT:                  Previously seen

Other:  Fetus appears to be a female. Technically difficult due to maternal
habitus.

Fetal Evaluation (Fetus B)

Num Of Fetuses:     2
Fetal Heart         164
Rate(bpm):
Cardiac Activity:   Observed
Fetal Lie:          Upper Fetus
Presentation:       Transverse, head to maternal right
Placenta:           Posterior
P. Cord Insertion:  Visualized, central
Membrane Desc:      Dividing Membrane seen - Dichorionic.

Amniotic Fluid
AFI FV:      Within normal limits
Largest Pocket(cm)
4.75
Biometry (Fetus B)

BPD:        56  mm     G. Age:  23w 1d         37  %    CI:        69.96   %    70 - 86
FL/HC:      20.3   %    19.2 -
HC:      213.6  mm     G. Age:  23w 3d         40  %    HC/AC:      1.14        1.05 -
AC:      187.4  mm     G. Age:  23w 4d         49  %    FL/BPD:     77.3   %    71 - 87
FL:       43.3  mm     G. Age:  24w 2d         68  %    FL/AC:      23.1   %    20 - 24

Est. FW:     624  gm      1 lb 6 oz     60  %     FW Discordancy      0 \ 4 %
Gestational Age (Fetus B)

LMP:           23w 2d        Date:  02/01/18                 EDD:   11/08/18
U/S Today:     23w 4d                                        EDD:   11/06/18
Best:          23w 2d     Det. By:  LMP  (02/01/18)          EDD:   11/08/18
Anatomy (Fetus B)

Cranium:               Appears normal         Aortic Arch:            Previously seen
Cavum:                 Appears normal         Ductal Arch:            Previously seen
Ventricles:            Appears normal         Diaphragm:              Appears normal
Choroid Plexus:        Previously seen        Stomach:                Appears normal, left
sided
Cerebellum:            Previously seen        Abdomen:                Appears normal
Posterior Fossa:       Previously seen        Abdominal Wall:         Appears nml (cord
insert, abd wall)
Nuchal Fold:           Previously seen        Cord Vessels:           Appears normal (3
vessel cord)
Face:                  Orbits and profile     Kidneys:                Appear normal
previously seen
Lips:                  Previously seen        Bladder:                Appears normal
Thoracic:              Appears normal         Spine:                  Previously seen
Heart:                 Appears normal         Upper Extremities:      Previously seen
(4CH, axis, and situs
RVOT:                  Previously seen        Lower Extremities:      Previously seen
LVOT:                  Previously seen

Other:  Fetus appears to be a female. Technically difficult due to maternal
habitus.
Cervix Uterus Adnexa

Cervix
Length:            3.8  cm.
Normal appearance by transabdominal scan.

Uterus
No abnormality visualized.
Impression

Dichorionic-diamniotic twin pregnancy. Chronic hypertension.
Well-controlled without medications.
She had cell-free fetal DNA screening that could not be
resulted because of low fetal fraction. She had a repeat
sample drawn today. Patient opted not to have amniocentesis.
Twin A: Lower fetus, variable presentation, posterior
placenta, FEMALE fetus. Fetal growth is appropriate for
gestational age. Amniotic fluid is normal and good fetal
activity is seen. Echogenic intracardiac focus is seen again.

Twin B: Upper fetus, transverse and head to maternal right,
posterior placenta, FEMALE fetus. Fetal growth is appropriate
for gestational age. Amniotic fluid is normal and good fetal
activity is seen.

Growth discordancy: 4% (normal).
We reassured the patient of the findings.
Recommendations

An appointment was made for her to return in 4 weeks for
fetal growth assessment.

## 2020-05-09 ENCOUNTER — Ambulatory Visit (HOSPITAL_COMMUNITY)
Admission: EM | Admit: 2020-05-09 | Discharge: 2020-05-10 | Disposition: A | Discharge status: Home / Self Care | Payer: Medicaid Other | Attending: Emergency Medicine | Admitting: Emergency Medicine

## 2020-05-09 ENCOUNTER — Encounter (HOSPITAL_COMMUNITY): Payer: Self-pay

## 2020-05-09 ENCOUNTER — Other Ambulatory Visit: Payer: Self-pay

## 2020-05-09 DIAGNOSIS — K819 Cholecystitis, unspecified: Secondary | ICD-10-CM | POA: Diagnosis present

## 2020-05-09 DIAGNOSIS — F79 Unspecified intellectual disabilities: Secondary | ICD-10-CM | POA: Diagnosis not present

## 2020-05-09 DIAGNOSIS — I10 Essential (primary) hypertension: Secondary | ICD-10-CM | POA: Diagnosis not present

## 2020-05-09 DIAGNOSIS — K760 Fatty (change of) liver, not elsewhere classified: Secondary | ICD-10-CM | POA: Diagnosis not present

## 2020-05-09 DIAGNOSIS — K8010 Calculus of gallbladder with chronic cholecystitis without obstruction: Secondary | ICD-10-CM | POA: Insufficient documentation

## 2020-05-09 DIAGNOSIS — R109 Unspecified abdominal pain: Secondary | ICD-10-CM

## 2020-05-09 DIAGNOSIS — Z20822 Contact with and (suspected) exposure to covid-19: Secondary | ICD-10-CM | POA: Diagnosis not present

## 2020-05-09 DIAGNOSIS — Z79899 Other long term (current) drug therapy: Secondary | ICD-10-CM | POA: Insufficient documentation

## 2020-05-09 LAB — CBC
HCT: 36.9 % (ref 36.0–46.0)
Hemoglobin: 11.8 g/dL — ABNORMAL LOW (ref 12.0–15.0)
MCH: 32 pg (ref 26.0–34.0)
MCHC: 32 g/dL (ref 30.0–36.0)
MCV: 100 fL (ref 80.0–100.0)
Platelets: 364 10*3/uL (ref 150–400)
RBC: 3.69 MIL/uL — ABNORMAL LOW (ref 3.87–5.11)
RDW: 12.8 % (ref 11.5–15.5)
WBC: 10.7 10*3/uL — ABNORMAL HIGH (ref 4.0–10.5)
nRBC: 0 % (ref 0.0–0.2)

## 2020-05-09 LAB — COMPREHENSIVE METABOLIC PANEL
ALT: 40 U/L (ref 0–44)
AST: 108 U/L — ABNORMAL HIGH (ref 15–41)
Albumin: 3.4 g/dL — ABNORMAL LOW (ref 3.5–5.0)
Alkaline Phosphatase: 81 U/L (ref 38–126)
Anion gap: 14 (ref 5–15)
BUN: 14 mg/dL (ref 6–20)
CO2: 24 mmol/L (ref 22–32)
Calcium: 9.1 mg/dL (ref 8.9–10.3)
Chloride: 96 mmol/L — ABNORMAL LOW (ref 98–111)
Creatinine, Ser: 0.9 mg/dL (ref 0.44–1.00)
GFR calc Af Amer: >60 mL/min (ref >60)
GFR calc non Af Amer: >60 mL/min (ref >60)
Glucose, Bld: 111 mg/dL — ABNORMAL HIGH (ref 70–99)
Potassium: 3.9 mmol/L (ref 3.5–5.1)
Sodium: 134 mmol/L — ABNORMAL LOW (ref 135–145)
Total Bilirubin: 0.8 mg/dL (ref 0.3–1.2)
Total Protein: 6.7 g/dL (ref 6.5–8.1)

## 2020-05-09 LAB — I-STAT BETA HCG BLOOD, ED (MC, WL, AP ONLY): I-stat hCG, quantitative: <5 m[IU]/mL (ref <5)

## 2020-05-09 LAB — LIPASE, BLOOD: Lipase: 32 U/L (ref 11–51)

## 2020-05-09 NOTE — ED Triage Notes (Signed)
Pt BIB [facility] for eval of abd pain. Pt reports acute onset LUQ abd pain that woke her from sleep onset about 2030. Pt reports radiation to back. Pt c/o nausea and pain. Pt rec'd 153mcg Fentanyl and 4mg  zofran by EMS.

## 2020-05-10 ENCOUNTER — Emergency Department (HOSPITAL_COMMUNITY): Payer: Medicaid Other

## 2020-05-10 ENCOUNTER — Emergency Department (HOSPITAL_COMMUNITY): Payer: Medicaid Other | Admitting: Certified Registered"

## 2020-05-10 ENCOUNTER — Encounter (HOSPITAL_COMMUNITY)
Admission: EM | Disposition: A | Discharge status: Home / Self Care | Payer: Self-pay | Source: Home / Self Care | Attending: Emergency Medicine

## 2020-05-10 ENCOUNTER — Encounter (HOSPITAL_COMMUNITY): Payer: Self-pay | Admitting: Student

## 2020-05-10 DIAGNOSIS — K760 Fatty (change of) liver, not elsewhere classified: Secondary | ICD-10-CM | POA: Diagnosis not present

## 2020-05-10 DIAGNOSIS — F79 Unspecified intellectual disabilities: Secondary | ICD-10-CM | POA: Diagnosis not present

## 2020-05-10 DIAGNOSIS — K8010 Calculus of gallbladder with chronic cholecystitis without obstruction: Secondary | ICD-10-CM | POA: Diagnosis not present

## 2020-05-10 DIAGNOSIS — I10 Essential (primary) hypertension: Secondary | ICD-10-CM | POA: Diagnosis not present

## 2020-05-10 HISTORY — PX: CHOLECYSTECTOMY: SHX55

## 2020-05-10 LAB — URINALYSIS, ROUTINE W REFLEX MICROSCOPIC
Bilirubin Urine: NEGATIVE
Glucose, UA: NEGATIVE mg/dL
Hgb urine dipstick: NEGATIVE
Ketones, ur: NEGATIVE mg/dL
Leukocytes,Ua: NEGATIVE
Nitrite: NEGATIVE
Protein, ur: NEGATIVE mg/dL
Specific Gravity, Urine: 1.016 (ref 1.005–1.030)
pH: 8 (ref 5.0–8.0)

## 2020-05-10 LAB — SARS CORONAVIRUS 2 BY RT PCR (HOSPITAL ORDER, PERFORMED IN ~~LOC~~ HOSPITAL LAB): SARS Coronavirus 2: NEGATIVE

## 2020-05-10 SURGERY — INDOCYANINE GREEN FLUORESCENCE IMAGING (ICG)
Anesthesia: General | Site: Abdomen

## 2020-05-10 MED ORDER — DEXMEDETOMIDINE HCL 200 MCG/2ML IV SOLN
INTRAVENOUS | Status: DC | PRN
  Administered 2020-05-10 (×2): 12 ug via INTRAVENOUS
  Administered 2020-05-10: 16 ug via INTRAVENOUS

## 2020-05-10 MED ORDER — SUCCINYLCHOLINE CHLORIDE 200 MG/10ML IV SOSY
PREFILLED_SYRINGE | INTRAVENOUS | Status: DC | PRN
  Administered 2020-05-10: 140 mg via INTRAVENOUS

## 2020-05-10 MED ORDER — MIDAZOLAM HCL 2 MG/2ML IJ SOLN
INTRAMUSCULAR | Status: AC
  Filled 2020-05-10: qty 2

## 2020-05-10 MED ORDER — BUPIVACAINE HCL 0.25 % IJ SOLN
INTRAMUSCULAR | Status: DC | PRN
  Administered 2020-05-10: 15 mL

## 2020-05-10 MED ORDER — STERILE WATER FOR IRRIGATION IR SOLN
Status: DC | PRN
  Administered 2020-05-10: 1000 mL

## 2020-05-10 MED ORDER — POLYETHYLENE GLYCOL 3350 17 G PO PACK: 17.0000 g | PACK | Freq: Every day | ORAL | 0 refills | Status: DC | PRN

## 2020-05-10 MED ORDER — MIDAZOLAM HCL 5 MG/5ML IJ SOLN
INTRAMUSCULAR | Status: DC | PRN
  Administered 2020-05-10: 2 mg via INTRAVENOUS

## 2020-05-10 MED ORDER — SUGAMMADEX SODIUM 200 MG/2ML IV SOLN
INTRAVENOUS | Status: DC | PRN
  Administered 2020-05-10: 400 mg via INTRAVENOUS

## 2020-05-10 MED ORDER — OXYCODONE HCL 5 MG/5ML PO SOLN: 5.0000 mg | Freq: Once | ORAL | Status: DC | PRN

## 2020-05-10 MED ORDER — HYDROMORPHONE HCL 1 MG/ML IJ SOLN
INTRAMUSCULAR | Status: AC
  Filled 2020-05-10: qty 1

## 2020-05-10 MED ORDER — ONDANSETRON HCL 4 MG/2ML IJ SOLN
INTRAMUSCULAR | Status: AC
  Filled 2020-05-10: qty 2

## 2020-05-10 MED ORDER — SODIUM CHLORIDE 0.9 % IV SOLN
2.0000 g | Freq: Once | INTRAVENOUS | Status: AC
  Administered 2020-05-10: 2 g via INTRAVENOUS
  Filled 2020-05-10: qty 20

## 2020-05-10 MED ORDER — ONDANSETRON HCL 4 MG/2ML IJ SOLN: 4.0000 mg | Freq: Once | INTRAMUSCULAR | Status: DC | PRN

## 2020-05-10 MED ORDER — INDOCYANINE GREEN 25 MG IV SOLR
INTRAVENOUS | Status: DC | PRN
Start: 2020-05-10 — End: 2020-05-10
  Administered 2020-05-10: 2.5 mg via INTRAVENOUS

## 2020-05-10 MED ORDER — FENTANYL CITRATE (PF) 250 MCG/5ML IJ SOLN
INTRAMUSCULAR | Status: AC
  Filled 2020-05-10: qty 5

## 2020-05-10 MED ORDER — PHENYLEPHRINE 40 MCG/ML (10ML) SYRINGE FOR IV PUSH (FOR BLOOD PRESSURE SUPPORT)
PREFILLED_SYRINGE | INTRAVENOUS | Status: AC
  Filled 2020-05-10: qty 10

## 2020-05-10 MED ORDER — LACTATED RINGERS IV SOLN: INTRAVENOUS | Status: DC

## 2020-05-10 MED ORDER — OXYCODONE HCL 5 MG PO TABS: 5.0000 mg | ORAL_TABLET | Freq: Once | ORAL | Status: DC | PRN

## 2020-05-10 MED ORDER — PHENYLEPHRINE 40 MCG/ML (10ML) SYRINGE FOR IV PUSH (FOR BLOOD PRESSURE SUPPORT)
PREFILLED_SYRINGE | INTRAVENOUS | Status: DC | PRN
  Administered 2020-05-10 (×3): 80 ug via INTRAVENOUS

## 2020-05-10 MED ORDER — ROCURONIUM BROMIDE 10 MG/ML (PF) SYRINGE
PREFILLED_SYRINGE | INTRAVENOUS | Status: DC | PRN
  Administered 2020-05-10: 10 mg via INTRAVENOUS
  Administered 2020-05-10: 40 mg via INTRAVENOUS

## 2020-05-10 MED ORDER — PROPOFOL 10 MG/ML IV BOLUS
INTRAVENOUS | Status: DC | PRN
  Administered 2020-05-10: 200 mg via INTRAVENOUS

## 2020-05-10 MED ORDER — HYDROMORPHONE HCL 1 MG/ML IJ SOLN
0.2500 mg | INTRAMUSCULAR | Status: DC | PRN
  Administered 2020-05-10: 0.5 mg via INTRAVENOUS

## 2020-05-10 MED ORDER — CHLORHEXIDINE GLUCONATE 0.12 % MT SOLN
OROMUCOSAL | Status: AC
  Administered 2020-05-10: 15 mL via OROMUCOSAL
  Filled 2020-05-10: qty 15

## 2020-05-10 MED ORDER — OXYCODONE HCL 5 MG PO TABS
5.0000 mg | ORAL_TABLET | Freq: Four times a day (QID) | ORAL | 0 refills | Status: DC | PRN
Start: 2020-05-10 — End: 2020-11-16

## 2020-05-10 MED ORDER — CHLORHEXIDINE GLUCONATE 0.12 % MT SOLN: 15.0000 mL | Freq: Once | OROMUCOSAL | Status: AC

## 2020-05-10 MED ORDER — ONDANSETRON HCL 4 MG/2ML IJ SOLN
INTRAMUSCULAR | Status: DC | PRN
  Administered 2020-05-10: 4 mg via INTRAVENOUS

## 2020-05-10 MED ORDER — DEXAMETHASONE SODIUM PHOSPHATE 10 MG/ML IJ SOLN
INTRAMUSCULAR | Status: DC | PRN
  Administered 2020-05-10: 6 mg via INTRAVENOUS

## 2020-05-10 MED ORDER — DEXAMETHASONE SODIUM PHOSPHATE 10 MG/ML IJ SOLN
INTRAMUSCULAR | Status: AC
  Filled 2020-05-10: qty 1

## 2020-05-10 MED ORDER — SODIUM CHLORIDE 0.9 % IR SOLN
Status: DC | PRN
  Administered 2020-05-10: 1000 mL

## 2020-05-10 MED ORDER — ONDANSETRON HCL 4 MG/2ML IJ SOLN
4.0000 mg | Freq: Once | INTRAMUSCULAR | Status: AC
  Administered 2020-05-10: 4 mg via INTRAVENOUS
  Filled 2020-05-10: qty 2

## 2020-05-10 MED ORDER — SUCCINYLCHOLINE CHLORIDE 200 MG/10ML IV SOSY
PREFILLED_SYRINGE | INTRAVENOUS | Status: AC
  Filled 2020-05-10: qty 10

## 2020-05-10 MED ORDER — ROCURONIUM BROMIDE 10 MG/ML (PF) SYRINGE
PREFILLED_SYRINGE | INTRAVENOUS | Status: AC
  Filled 2020-05-10: qty 10

## 2020-05-10 MED ORDER — IBUPROFEN 200 MG PO TABS: 400.0000 mg | ORAL_TABLET | Freq: Four times a day (QID) | ORAL | Status: DC | PRN

## 2020-05-10 MED ORDER — LIDOCAINE 2% (20 MG/ML) 5 ML SYRINGE
INTRAMUSCULAR | Status: AC
  Filled 2020-05-10: qty 5

## 2020-05-10 MED ORDER — SODIUM CHLORIDE 0.9 % IV BOLUS
1000.0000 mL | Freq: Once | INTRAVENOUS | Status: AC
  Administered 2020-05-10: 1000 mL via INTRAVENOUS

## 2020-05-10 MED ORDER — BUPIVACAINE HCL (PF) 0.25 % IJ SOLN
INTRAMUSCULAR | Status: AC
  Filled 2020-05-10: qty 30

## 2020-05-10 MED ORDER — ACETAMINOPHEN 500 MG PO TABS: 500.0000 mg | ORAL_TABLET | Freq: Four times a day (QID) | ORAL | 2 refills | Status: DC | PRN

## 2020-05-10 MED ORDER — MORPHINE SULFATE (PF) 4 MG/ML IV SOLN
4.0000 mg | Freq: Once | INTRAVENOUS | Status: AC
  Administered 2020-05-10: 4 mg via INTRAVENOUS
  Filled 2020-05-10: qty 1

## 2020-05-10 MED ORDER — LIDOCAINE 2% (20 MG/ML) 5 ML SYRINGE
INTRAMUSCULAR | Status: DC | PRN
  Administered 2020-05-10: 60 mg via INTRAVENOUS

## 2020-05-10 MED ORDER — HYDROMORPHONE HCL 1 MG/ML IJ SOLN
0.5000 mg | INTRAMUSCULAR | Status: DC | PRN
  Administered 2020-05-10: 0.5 mg via INTRAVENOUS
  Filled 2020-05-10: qty 1

## 2020-05-10 MED ORDER — 0.9 % SODIUM CHLORIDE (POUR BTL) OPTIME
TOPICAL | Status: DC | PRN
  Administered 2020-05-10: 1000 mL

## 2020-05-10 MED ORDER — SCOPOLAMINE 1 MG/3DAYS TD PT72
MEDICATED_PATCH | TRANSDERMAL | Status: DC | PRN
  Administered 2020-05-10: 1.5 mg via TRANSDERMAL

## 2020-05-10 MED ORDER — SCOPOLAMINE 1 MG/3DAYS TD PT72
MEDICATED_PATCH | TRANSDERMAL | Status: AC
  Filled 2020-05-10: qty 1

## 2020-05-10 MED ORDER — FENTANYL CITRATE (PF) 100 MCG/2ML IJ SOLN
INTRAMUSCULAR | Status: DC | PRN
  Administered 2020-05-10 (×2): 100 ug via INTRAVENOUS
  Administered 2020-05-10 (×2): 50 ug via INTRAVENOUS

## 2020-05-10 MED ORDER — LACTATED RINGERS IV SOLN: INTRAVENOUS | Status: DC | PRN

## 2020-05-10 MED ORDER — PROPOFOL 10 MG/ML IV BOLUS
INTRAVENOUS | Status: AC
  Filled 2020-05-10: qty 20

## 2020-05-10 SURGICAL SUPPLY — 49 items
ADH SKN CLS APL DERMABOND .7 (GAUZE/BANDAGES/DRESSINGS) ×2
APL PRP STRL LF DISP 70% ISPRP (MISCELLANEOUS) ×2
APPLIER CLIP 5 13 M/L LIGAMAX5 (MISCELLANEOUS)
APR CLP MED LRG 5 ANG JAW (MISCELLANEOUS)
BAG SPEC RTRVL 10 TROC 200 (ENDOMECHANICALS)
CANISTER SUCT 3000ML PPV (MISCELLANEOUS) ×4 IMPLANT
CHLORAPREP W/TINT 26 (MISCELLANEOUS) ×4 IMPLANT
CLIP APPLIE 5 13 M/L LIGAMAX5 (MISCELLANEOUS) IMPLANT
CLIP VESOLOCK MED LG 6/CT (CLIP) ×4 IMPLANT
COVER MAYO STAND STRL (DRAPES) ×1 IMPLANT
COVER SURGICAL LIGHT HANDLE (MISCELLANEOUS) ×4 IMPLANT
COVER TRANSDUCER ULTRASND (DRAPES) ×4 IMPLANT
COVER WAND RF STERILE (DRAPES) ×4 IMPLANT
DEFOGGER SCOPE WARMER CLEARIFY (MISCELLANEOUS) IMPLANT
DERMABOND ADVANCED (GAUZE/BANDAGES/DRESSINGS) ×2
DERMABOND ADVANCED .7 DNX12 (GAUZE/BANDAGES/DRESSINGS) ×2 IMPLANT
DRAPE C-ARM 42X120 X-RAY (DRAPES) ×4 IMPLANT
ELECT REM PT RETURN 9FT ADLT (ELECTROSURGICAL) ×4
ELECTRODE REM PT RTRN 9FT ADLT (ELECTROSURGICAL) ×2 IMPLANT
GLOVE BIO SURGEON STRL SZ7.5 (GLOVE) ×4 IMPLANT
GOWN STRL REUS W/ TWL LRG LVL3 (GOWN DISPOSABLE) ×4 IMPLANT
GOWN STRL REUS W/ TWL XL LVL3 (GOWN DISPOSABLE) ×2 IMPLANT
GOWN STRL REUS W/TWL LRG LVL3 (GOWN DISPOSABLE) ×8
GOWN STRL REUS W/TWL XL LVL3 (GOWN DISPOSABLE) ×4
GRASPER SUT TROCAR 14GX15 (MISCELLANEOUS) ×4 IMPLANT
IV CATH 14GX2 1/4 (CATHETERS) ×1 IMPLANT
KIT BASIN OR (CUSTOM PROCEDURE TRAY) ×4 IMPLANT
KIT TURNOVER KIT B (KITS) ×4 IMPLANT
NDL INSUFFLATION 14GA 120MM (NEEDLE) ×1 IMPLANT
NEEDLE INSUFFLATION 14GA 120MM (NEEDLE) ×4 IMPLANT
NS IRRIG 1000ML POUR BTL (IV SOLUTION) ×4 IMPLANT
PAD ARMBOARD 7.5X6 YLW CONV (MISCELLANEOUS) ×8 IMPLANT
POUCH LAPAROSCOPIC INSTRUMENT (MISCELLANEOUS) ×4 IMPLANT
POUCH RETRIEVAL ECOSAC 10 (ENDOMECHANICALS) IMPLANT
POUCH RETRIEVAL ECOSAC 10MM (ENDOMECHANICALS)
SCISSORS LAP 5X35 DISP (ENDOMECHANICALS) ×4 IMPLANT
SET CHOLANGIOGRAPHY FRANKLIN (SET/KITS/TRAYS/PACK) ×1 IMPLANT
SET IRRIG TUBING LAPAROSCOPIC (IRRIGATION / IRRIGATOR) ×4 IMPLANT
SET TUBE SMOKE EVAC HIGH FLOW (TUBING) ×4 IMPLANT
SLEEVE ENDOPATH XCEL 5M (ENDOMECHANICALS) ×4 IMPLANT
SPECIMEN JAR SMALL (MISCELLANEOUS) ×4 IMPLANT
STOPCOCK 4 WAY LG BORE MALE ST (IV SETS) ×4 IMPLANT
SUT MNCRL AB 4-0 PS2 18 (SUTURE) ×4 IMPLANT
TOWEL GREEN STERILE (TOWEL DISPOSABLE) ×4 IMPLANT
TOWEL GREEN STERILE FF (TOWEL DISPOSABLE) ×4 IMPLANT
TRAY LAPAROSCOPIC MC (CUSTOM PROCEDURE TRAY) ×4 IMPLANT
TROCAR XCEL NON-BLD 11X100MML (ENDOMECHANICALS) ×4 IMPLANT
TROCAR XCEL NON-BLD 5MMX100MML (ENDOMECHANICALS) ×4 IMPLANT
WATER STERILE IRR 1000ML POUR (IV SOLUTION) ×4 IMPLANT

## 2020-05-10 NOTE — Transfer of Care (Signed)
Immediate Anesthesia Transfer of Care Note  Patient: [name] [name]  Procedure(s) Performed: INDOCYANINE GREEN FLUORESCENCE IMAGING (ICG) (N/A Abdomen) LAPAROSCOPIC CHOLECYSTECTOMY (N/A Abdomen)  Patient Location: PACU  Anesthesia Type:General  Level of Consciousness: sedated and drowsy  Airway & Oxygen Therapy: Patient Spontanous Breathing and Patient connected to face mask oxygen  Post-op Assessment: Report given to RN, Post -op Vital signs reviewed and stable and Patient moving all extremities X 4  Post vital signs: Reviewed and stable  Last Vitals:  Vitals Value Taken Time  BP 138/85 05/10/20 1030  Temp    Pulse 72 05/10/20 1031  Resp 18 05/10/20 1031  SpO2 100 % 05/10/20 1031  Vitals shown include unvalidated device data.  Last Pain:  Vitals:   05/10/20 0731  TempSrc: Oral  PainSc:          Complications: No complications documented.

## 2020-05-10 NOTE — Op Note (Signed)
05/10/2020  10:08 AM  PATIENT:  [name] [name]  34 y.o. female  PRE-OPERATIVE DIAGNOSIS:  Acute Cholecytitis  POST-OPERATIVE DIAGNOSIS:  Acute Cholecytitis, cholelithiasis  PROCEDURE:  Procedure(s): INDOCYANINE GREEN FLUORESCENCE IMAGING (ICG) (N/A) LAPAROSCOPIC CHOLECYSTECTOMY (N/A)  SURGEON:  Surgeon(s) and Role:    Ralene Ok, MD - Primary        Maczis, Legrand Como, PA-C -Assisting - who was essential at helping with retraction and closure of the abdominal trocar sites.  ANESTHESIA:   local and general  EBL:  minimal   BLOOD ADMINISTERED:none  DRAINS: none   LOCAL MEDICATIONS USED:  BUPIVICAINE   SPECIMEN:  Source of Specimen:  gallbladder  DISPOSITION OF SPECIMEN:  PATHOLOGY  COUNTS:  YES  TOURNIQUET:  * No tourniquets in log *  DICTATION: .Dragon Dictation The patient was taken to the operating and placed in the supine position with bilateral SCDs in place.  The patient was prepped and draped in the usual sterile fashion. A time out was called and all facts were verified. A pneumoperitoneum was obtained via A Veress needle technique to a pressure of 4mm of mercury.  A 67mm trochar was then placed in the right upper quadrant under visualization, and there were no injuries to any abdominal organs. A 11 mm port was then placed in the umbilical region after infiltrating with local anesthesia under direct visualization. A second and third epigastric port and right lower quadrant port placement under direct visualization, respectively.    The gallbladder was identified and retracted, the peritoneum was then sharply dissected from the gallbladder and this dissection was carried down to Calot's triangle. The gallbladder was identified and stripped away circumferentially and seen going into the gallbladder 360, the critical angle was obtained.  2 clips were placed proximally one distally and the cystic duct transected. The cystic artery was identified and 2 clips placed  proximally and one distally and transected.  We then proceeded to remove the gallbladder off the hepatic fossa with Bovie cautery. A retrieval bag was then placed in the abdomen and gallbladder placed in the bag. The hepatic fossa was then reexamined and hemostasis was achieved with Bovie cautery and was excellent at the end of the case.   The subhepatic fossa and perihepatic fossa was then irrigated until the effluent was clear.  The gallbladder and bag were removed from the abdominal cavity. The 11 mm trocar fascia was reapproximated with the Endo Close #1 Vicryl x2.  The pneumoperitoneum was evacuated and all trochars removed under direct visulalization.  The skin was then closed with 4-0 Monocryl and the skin dressed with Dermabond.    The patient was awaken from general anesthesia and taken to the recovery room in stable condition.   PLAN OF CARE: Discharge to home after PACU  PATIENT DISPOSITION:  PACU - hemodynamically stable.   Delay start of Pharmacological VTE agent (>24hrs) due to surgical blood loss or risk of bleeding: not applicable

## 2020-05-10 NOTE — Anesthesia Postprocedure Evaluation (Signed)
Anesthesia Post Note  Patient: [name] [name]  Procedure(s) Performed: INDOCYANINE GREEN FLUORESCENCE IMAGING (ICG) (N/A Abdomen) LAPAROSCOPIC CHOLECYSTECTOMY (N/A Abdomen)     Patient location during evaluation: PACU Anesthesia Type: General Level of consciousness: awake and alert and oriented Pain management: pain level controlled Vital Signs Assessment: post-procedure vital signs reviewed and stable Respiratory status: spontaneous breathing, nonlabored ventilation and respiratory function stable Cardiovascular status: blood pressure returned to baseline and stable Postop Assessment: no apparent nausea or vomiting Anesthetic complications: no   No complications documented.  Last Vitals:  Vitals:   05/10/20 1045 05/10/20 1100  BP: 111/77 (!) 132/92  Pulse: 66 69  Resp: 19 18  Temp:    SpO2: 100% 99%    Last Pain:  Vitals:   05/10/20 1100  TempSrc:   PainSc: 8                  Yuli Lanigan A.

## 2020-05-10 NOTE — ED Notes (Signed)
Patient transported to Ultrasound via stretcher 

## 2020-05-10 NOTE — Anesthesia Preprocedure Evaluation (Addendum)
Anesthesia Evaluation  Patient identified by MRN, date of birth, ID band Patient awake    Reviewed: Allergy & Precautions, NPO status , Patient's Chart, lab work & pertinent test results  Airway Mallampati: III  TM Distance: >3 FB Neck ROM: Full    Dental no notable dental hx. (+) Teeth Intact, Missing, Poor Dentition   Pulmonary neg pulmonary ROS,    Pulmonary exam normal breath sounds clear to auscultation       Cardiovascular hypertension, Normal cardiovascular exam Rhythm:Regular Rate:Normal  Right atrial mass  Echo 12/05/19 1. There is a 1.6cm x 1.1cm mobile, round, echogenic mass located at the  junction of the IVC and right atrium. 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 While IVC/RA junction would be unusual location for a myxoma, there have been case reports of myxomas at this location. Differential includes thrombus. Could consider cardiac MRI for further evaluation.  2. Left ventricular ejection fraction, by visual estimation, is 55 to 60%. The left ventricle has normal function.  3. Global right ventricle has normal systolic function.The right ventricular size is normal.  4. The mitral valve is normal in structure. No evidence of mitral valve regurgitation.  5. The tricuspid valve is normal in structure.  6. The aortic valve is tricuspid. Aortic valve regurgitation is not visualized.  7. The pulmonic valve was grossly normal. Pulmonic valve regurgitation is not visualized.   EKG 02/20/20 NSR, possible anterior infarct age indeterminate  Not on anti HTN Rx now, Hx/o pre eclampsia   Neuro/Psych  Headaches, PSYCHIATRIC DISORDERS Anxiety Depression PTSD   GI/Hepatic Neg liver ROS, Cholelithiasis with acute cholecystitis   Endo/Other  Morbid obesity  Renal/GU negative Renal ROS  negative genitourinary   Musculoskeletal   Abdominal (+) + obese,  Abdomen:  tender.    Peds  Hematology   Anesthesia Other Findings   Reproductive/Obstetrics                           Anesthesia Physical Anesthesia Plan  ASA: III  Anesthesia Plan: General   Post-op Pain Management:    Induction: Intravenous, Cricoid pressure planned and Rapid sequence  PONV Risk Score and Plan: 4 or greater and Scopolamine patch - Pre-op, Midazolam, Ondansetron, Dexamethasone and Treatment may vary due to age or medical condition  Airway Management Planned: Oral ETT  Additional Equipment:   Intra-op Plan:   Post-operative Plan: Extubation in OR  Informed Consent: I have reviewed the patients History and Physical, chart, labs and discussed the procedure including the risks, benefits and alternatives for the proposed anesthesia with the patient or authorized representative who has indicated his/her understanding and acceptance.     Dental advisory given  Plan Discussed with: CRNA and Surgeon  Anesthesia Plan Comments:         Anesthesia Quick Evaluation

## 2020-05-10 NOTE — H&P (Addendum)
CC: Acute cholecystitis/RUQ pain  Requesting provider: Kennith Maes PA-C  HPI: [name] [name] is an 34 y.o. female with hx of gestational HTN, PTSD whom presents for evaluation of RUQ pain that is sharp, radiates to back and LUQ and severe/constant. Has had more mild versions of this in the past but improved on its own. This episode has been more severe and associated with chills/sweating. Denies nausea/vomiting. It began overnight following mac and cheese + chicken for dinner.  Prior c-sxs, denies any other abdominal surgical history; she has 8 children and works at [employer]  Past Medical History:  Diagnosis Date   Abnormal Pap smear    f/u was normal   Chronic hypertension    Chronic hypertension    DEPRESSION, MAJOR, RECURRENT, MODERATE 04/18/2010   Qualifier: Diagnosis of  By: Georgina Snell MD, Darnelle Spangle)    Herpes simplex virus (HSV) infection    History of abnormal Pap smear 07/28/2012   History of preterm delivery 11/04/2018   Infection    trich, chlamydia, gonorrhea   Ovarian cyst    PTSD (post-traumatic stress disorder)    Right atrial mass    Urinary tract infection     Past Surgical History:  Procedure Laterality Date   CESAREAN SECTION     CESAREAN SECTION N/A 09/23/2018   Procedure: CESAREAN SECTION;  Surgeon: Mora Bellman, MD;  Location: [facility];  Service: Obstetrics;  Laterality: N/A;   TEE WITHOUT CARDIOVERSION N/A 12/05/2019   Procedure: TRANSESOPHAGEAL ECHOCARDIOGRAM (TEE);  Surgeon: Donato Heinz, MD;  Location: [facility];  Service: Endoscopy;  Laterality: N/A;   TONSILLECTOMY     WISDOM TOOTH EXTRACTION      Family History  Problem Relation Age of Onset   Hypertension Mother    Arthritis Mother    Hypertension Father    Asthma Brother    Cancer Maternal Uncle        lung   Diabetes Maternal Grandmother    Cancer Maternal Grandmother        thyroid   Hypertension Maternal Grandmother     Other Neg Hx     Social:  reports that she has never smoked. She has never used smokeless tobacco. She reports previous alcohol use of about 2.0 standard drinks of alcohol per week. She reports previous drug use. Drug: Marijuana.  Allergies: No Known Allergies  Medications: I have reviewed the patient's current medications.  Results for orders placed or performed during the hospital encounter of 05/09/20 (from the past 48 hour(s))  Lipase, blood     Status: None   Collection Time: 05/09/20 10:01 PM  Result Value Ref Range   Lipase 32 11 - 51 U/L    Comment: Performed at [facility], [place] [address], [place], [place] [address]  Comprehensive metabolic panel     Status: Abnormal   Collection Time: 05/09/20 10:01 PM  Result Value Ref Range   Sodium 134 (L) 135 - 145 mmol/L   Potassium 3.9 3.5 - 5.1 mmol/L   Chloride 96 (L) 98 - 111 mmol/L   CO2 24 22 - 32 mmol/L   Glucose, Bld 111 (H) 70 - 99 mg/dL    Comment: Glucose reference range applies only to samples taken after fasting for at least 8 hours.   BUN 14 6 - 20 mg/dL   Creatinine, Ser 0.90 0.44 - 1.00 mg/dL   Calcium 9.1 8.9 - 10.3 mg/dL   Total Protein 6.7 6.5 - 8.1 g/dL  Albumin 3.4 (L) 3.5 - 5.0 g/dL   AST 108 (H) 15 - 41 U/L   ALT 40 0 - 44 U/L   Alkaline Phosphatase 81 38 - 126 U/L   Total Bilirubin 0.8 0.3 - 1.2 mg/dL   GFR calc non Af Amer >60 >60 mL/min   GFR calc Af Amer >60 >60 mL/min   Anion gap 14 5 - 15    Comment: Performed at [facility] [address], [place], [place] [address]  CBC     Status: Abnormal   Collection Time: 05/09/20 10:01 PM  Result Value Ref Range   WBC 10.7 (H) 4.0 - 10.5 K/uL   RBC 3.69 (L) 3.87 - 5.11 MIL/uL   Hemoglobin 11.8 (L) 12.0 - 15.0 g/dL   HCT 36.9 36 - 46 %   MCV 100.0 80.0 - 100.0 fL   MCH 32.0 26.0 - 34.0 pg   MCHC 32.0 30.0 - 36.0 g/dL   RDW 12.8 11.5 - 15.5 %   Platelets 364 150 - 400 K/uL   nRBC 0.0 0.0 - 0.2 %    Comment: Performed at [facility], [place] [address] [place], [place] [address]  I-Stat beta hCG blood, ED     Status: None   Collection Time: 05/09/20 10:54 PM  Result Value Ref Range   I-stat hCG, quantitative <5.0 <5 mIU/mL   Comment 3            Comment:   GEST. AGE      CONC.  (mIU/mL)   <=1 WEEK        5 - 50     2 WEEKS       50 - 500     3 WEEKS       100 - 10,000     4 WEEKS     1,000 - 30,000        FEMALE AND NON-PREGNANT FEMALE:     LESS THAN 5 mIU/mL   Urinalysis, Routine w reflex microscopic     Status: None   Collection Time: 05/10/20  2:20 AM  Result Value Ref Range   Color, Urine YELLOW YELLOW   APPearance CLEAR CLEAR   Specific Gravity, Urine 1.016 1.005 - 1.030   pH 8.0 5.0 - 8.0   Glucose, UA NEGATIVE NEGATIVE mg/dL   Hgb urine dipstick NEGATIVE NEGATIVE   Bilirubin Urine NEGATIVE NEGATIVE   Ketones, ur NEGATIVE NEGATIVE mg/dL   Protein, ur NEGATIVE NEGATIVE mg/dL   Nitrite NEGATIVE NEGATIVE   Leukocytes,Ua NEGATIVE NEGATIVE    Comment: Performed at [facility], [place] [address], [place], [place] [address]    US Abdomen Limited RUQ  Result Date: 05/10/2020 CLINICAL DATA:  Right upper quadrant pain for 1 day EXAM: ULTRASOUND ABDOMEN LIMITED RIGHT UPPER QUADRANT COMPARISON:  CT abdomen pelvis 09/05/2017, abdominal radiograph 09/28/2018 FINDINGS: Gallbladder: Multiple echogenic, shadowing gallstones are present within the gallbladder. Borderline gallbladder wall thickening at 4 mm. No visible pericholecystic fluid. Sonographic Percell Miller sign is reportedly positive. Common bile duct: Diameter: 5 mm Liver: Diffusely increased hepatic echogenicity with loss of definition of the portal triads and diminished posterior through transmission compatible with hepatic steatosis. No focal liver lesion. Portal vein is patent on color Doppler imaging with normal direction of blood flow towards the liver. Other: None. IMPRESSION: The cholelithiasis with gallbladder wall thickening and a positive sonographic  Murphy sign which could reflect acute cholecystitis in the appropriate clinical context. Increased hepatic echogenicity  compatible hepatic steatosis. Electronically Signed   By: Lovena Le M.D.   On: 05/10/2020 03:20    ROS - all of the below systems have been reviewed with the patient and positives are indicated with bold text General: chills, fever or night sweats Eyes: blurry vision or double vision ENT: epistaxis or sore throat Allergy/Immunology: itchy/watery eyes or nasal congestion Hematologic/Lymphatic: bleeding problems, blood clots or swollen lymph nodes Endocrine: temperature intolerance or unexpected weight changes Breast: new or changing breast lumps or nipple discharge Resp: cough, shortness of breath, or wheezing CV: chest pain or dyspnea on exertion GI: as per HPI GU: dysuria, trouble voiding, or hematuria MSK: joint pain or joint stiffness Neuro: TIA or stroke symptoms Derm: pruritus and skin lesion changes Psych: anxiety and depression  PE Blood pressure 136/62, pulse 71, temperature 98.6 F (37 C), temperature source Oral, resp. rate 13, height _0  (1.575 m), weight 105 kg, SpO2 98 %, not currently breastfeeding. Constitutional: NAD; conversant; no deformities Eyes: Moist conjunctiva; no lid lag; anicteric; pupils equal and round Neck: Trachea midline; no thyromegaly Lungs: Normal respiratory effort; no tactile fremitus CV: RRR; no palpable thrills; no pitting edema GI: Abd obese, soft, focally ttp in RUQ; negative Murphy's; no tenderness elsewhere, nondistended; no palpable hepatosplenomegaly MSK: Normal range of motion of extremities; no clubbing/cyanosis Psychiatric: Appropriate affect; alert and oriented x3 Lymphatic: No palpable cervical or axillary lymphadenopathy  Results for orders placed or performed during the hospital encounter of 05/09/20 (from the past 48 hour(s))  Lipase, blood     Status: None   Collection Time: 05/09/20 10:01 PM  Result Value  Ref Range   Lipase 32 11 - 51 U/L    Comment: Performed at [facility], [place] [address], [place], [place] [address]  Comprehensive metabolic panel     Status: Abnormal   Collection Time: 05/09/20 10:01 PM  Result Value Ref Range   Sodium 134 (L) 135 - 145 mmol/L   Potassium 3.9 3.5 - 5.1 mmol/L   Chloride 96 (L) 98 - 111 mmol/L   CO2 24 22 - 32 mmol/L   Glucose, Bld 111 (H) 70 - 99 mg/dL    Comment: Glucose reference range applies only to samples taken after fasting for at least 8 hours.   BUN 14 6 - 20 mg/dL   Creatinine, Ser 0.90 0.44 - 1.00 mg/dL   Calcium 9.1 8.9 - 10.3 mg/dL   Total Protein 6.7 6.5 - 8.1 g/dL   Albumin 3.4 (L) 3.5 - 5.0 g/dL   AST 108 (H) 15 - 41 U/L   ALT 40 0 - 44 U/L   Alkaline Phosphatase 81 38 - 126 U/L   Total Bilirubin 0.8 0.3 - 1.2 mg/dL   GFR calc non Af Amer >60 >60 mL/min   GFR calc Af Amer >60 >60 mL/min   Anion gap 14 5 - 15    Comment: Performed at [facility] [address] [place],  [address]  CBC     Status: Abnormal   Collection Time: 05/09/20 10:01 PM  Result Value Ref Range   WBC 10.7 (H) 4.0 - 10.5 K/uL   RBC 3.69 (L) 3.87 - 5.11 MIL/uL   Hemoglobin 11.8 (L) 12.0 - 15.0 g/dL   HCT 36.9 36 - 46 %   MCV 100.0 80.0 - 100.0 fL   MCH 32.0 26.0 - 34.0 pg   MCHC 32.0 30.0 - 36.0 g/dL   RDW 12.8 11.5 - 15.5 %  Platelets 364 150 - 400 K/uL   nRBC 0.0 0.0 - 0.2 %    Comment: Performed at [facility], [place] [address] [place], [place] [address]  I-Stat beta hCG blood, ED     Status: None   Collection Time: 05/09/20 10:54 PM  Result Value Ref Range   I-stat hCG, quantitative <5.0 <5 mIU/mL   Comment 3            Comment:   GEST. AGE      CONC.  (mIU/mL)   <=1 WEEK        5 - 50     2 WEEKS       50 - 500     3 WEEKS       100 - 10,000     4 WEEKS     1,000 - 30,000        FEMALE AND NON-PREGNANT FEMALE:     LESS THAN 5 mIU/mL   Urinalysis, Routine w reflex microscopic     Status: None   Collection Time:  05/10/20  2:20 AM  Result Value Ref Range   Color, Urine YELLOW YELLOW   APPearance CLEAR CLEAR   Specific Gravity, Urine 1.016 1.005 - 1.030   pH 8.0 5.0 - 8.0   Glucose, UA NEGATIVE NEGATIVE mg/dL   Hgb urine dipstick NEGATIVE NEGATIVE   Bilirubin Urine NEGATIVE NEGATIVE   Ketones, ur NEGATIVE NEGATIVE mg/dL   Protein, ur NEGATIVE NEGATIVE mg/dL   Nitrite NEGATIVE NEGATIVE   Leukocytes,Ua NEGATIVE NEGATIVE    Comment: Performed at [facility], [place] [address] [place], [place] [address]    US Abdomen Limited RUQ  Result Date: 05/10/2020 CLINICAL DATA:  Right upper quadrant pain for 1 day EXAM: ULTRASOUND ABDOMEN LIMITED RIGHT UPPER QUADRANT COMPARISON:  CT abdomen pelvis 09/05/2017, abdominal radiograph 09/28/2018 FINDINGS: Gallbladder: Multiple echogenic, shadowing gallstones are present within the gallbladder. Borderline gallbladder wall thickening at 4 mm. No visible pericholecystic fluid. Sonographic Percell Miller sign is reportedly positive. Common bile duct: Diameter: 5 mm Liver: Diffusely increased hepatic echogenicity with loss of definition of the portal triads and diminished posterior through transmission compatible with hepatic steatosis. No focal liver lesion. Portal vein is patent on color Doppler imaging with normal direction of blood flow towards the liver. Other: None. IMPRESSION: The cholelithiasis with gallbladder wall thickening and a positive sonographic Murphy sign which could reflect acute cholecystitis in the appropriate clinical context. Increased hepatic echogenicity compatible hepatic steatosis. Electronically Signed   By: Lovena Le M.D.   On: 05/10/2020 03:20     A/P: [name] [name] is an 34 y.o. female with acute cholecystitis  -Admit -NPO, MIVF, IV abx -The anatomy and physiology of the hepatobiliary system was discussed at length with the patient with associated pictures. The pathophysiology of gallbladder disease was discussed at length with associated  pictures. -The options for treatment were discussed including ongoing observation which may result in subsequent gallbladder complications (infection, pancreatitis, choledocholithiasis, etc) and surgery - laparoscopic cholecystectomy -The planned procedure, material risks (including, but not limited to, pain, bleeding, infection, scarring, need for blood transfusion, damage to surrounding structures- blood vessels/nerves/viscus/organs, damage to bile duct, bile leak, need for additional procedures, hernia, worsening of pre-existing medical conditions, pancreatitis, pneumonia, heart attack, stroke, death) benefits and alternatives to surgery were discussed at length. I noted a good probability that the procedure would help improve her symptoms. The patient's questions were answered to her satisfaction, she voiced understanding and they elected  to proceed with surgery. Additionally, we discussed typical postoperative expectations and the recovery process including lifting restrictions and anticipated time away from work being a couple weeks followed by lighter duty -We reviewed that surgery would be with my partner, Dr. Rosendo Gros and she would meet him as well prior to surgery.  Sharon Mt. Dema Severin, M.D. [facility] Use [email or web address] to contact on call provider

## 2020-05-10 NOTE — Anesthesia Procedure Notes (Signed)
Procedure Name: Intubation Date/Time: 05/10/2020 9:17 AM Performed by: Orlie Dakin, CRNA Pre-anesthesia Checklist: Patient identified, Emergency Drugs available, Suction available and Patient being monitored Patient Re-evaluated:Patient Re-evaluated prior to induction Oxygen Delivery Method: Circle system utilized Preoxygenation: Pre-oxygenation with 100% oxygen Induction Type: IV induction Ventilation: Mask ventilation without difficulty Laryngoscope Size: Mac and 4 Grade View: Grade III Tube type: Oral Tube size: 7.0 mm Number of attempts: 1 Airway Equipment and Method: Stylet Placement Confirmation: ETT inserted through vocal cords under direct vision,  positive ETCO2 and breath sounds checked- equal and bilateral Secured at: 22 cm Tube secured with: Tape Dental Injury: Teeth and Oropharynx as per pre-operative assessment  Comments: Oral airway placed after OG placed.
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
## 2020-05-11 ENCOUNTER — Encounter (HOSPITAL_COMMUNITY): Payer: Self-pay | Admitting: General Surgery

## 2020-05-11 LAB — SURGICAL PATHOLOGY

## 2020-05-15 ENCOUNTER — Encounter: Payer: Self-pay | Admitting: Family Medicine

## 2020-05-24 NOTE — Progress Notes (Deleted)
Cardiology Clinic Note   Patient Name: [name] [name] Date of Encounter: 05/24/2020  Primary Care Provider:  Kathrene Alu, MD Primary Cardiologist:  Skeet Latch, MD  Patient Profile    ***  Past Medical History    Past Medical History:  Diagnosis Date  . Abnormal Pap smear    f/u was normal  . Chronic hypertension   . Chronic hypertension   . DEPRESSION, MAJOR, RECURRENT, MODERATE 04/18/2010   Qualifier: Diagnosis of  By: Georgina Snell MD, Ellard Artis    . Headache(784.0)   . Herpes simplex virus (HSV) infection   . History of abnormal Pap smear 07/28/2012  . History of preterm delivery 11/04/2018  . Infection    trich, chlamydia, gonorrhea  . Ovarian cyst   . PTSD (post-traumatic stress disorder)   . Right atrial mass   . Urinary tract infection    Past Surgical History:  Procedure Laterality Date  . CESAREAN SECTION    . CESAREAN SECTION N/A 09/23/2018   Procedure: CESAREAN SECTION;  Surgeon: Mora Bellman, MD;  Location: [place];  Service: Obstetrics;  Laterality: N/A;  . CHOLECYSTECTOMY N/A 05/10/2020   Procedure: LAPAROSCOPIC CHOLECYSTECTOMY;  Surgeon: Ralene Ok, MD;  Location: [place];  Service: General;  Laterality: N/A;  . TEE WITHOUT CARDIOVERSION N/A 12/05/2019   Procedure: TRANSESOPHAGEAL ECHOCARDIOGRAM (TEE);  Surgeon: Donato Heinz, MD;  Location: [facility] ENDOSCOPY;  Service: Endoscopy;  Laterality: N/A;  . TONSILLECTOMY    . WISDOM TOOTH EXTRACTION      Allergies  No Known Allergies  History of Present Illness    ***  Home Medications    Prior to Admission medications   Medication Sig Start Date End Date Taking? Authorizing Provider  acetaminophen (TYLENOL) 500 MG tablet Take 1-2 tablets (500-1,000 mg total) by mouth every 6 (six) hours as needed for mild pain. 05/10/20   Meuth, Brooke A, PA-C  ibuprofen (ADVIL) 200 MG tablet Take 2-3 tablets (400-600 mg total) by mouth every 6 (six) hours as needed for mild pain. 05/10/20   Meuth,  Brooke A, PA-C  ibuprofen (ADVIL) 600 MG tablet Take 1 tablet (600 mg total) by mouth every 8 (eight) hours as needed for mild pain. Patient not taking: Reported on 01/31/2020 12/05/19   Lajean Manes, CNM  oxyCODONE (OXY IR/ROXICODONE) 5 MG immediate release tablet Take 1 tablet (5 mg total) by mouth every 6 (six) hours as needed for severe pain. 05/10/20   Meuth, Brooke A, PA-C  polyethylene glycol (MIRALAX) 17 g packet Take 17 g by mouth daily as needed for mild constipation. 05/10/20   Meuth, Blaine Hamper, PA-C    Family History    Family History  Problem Relation Age of Onset  . Hypertension Mother   . Arthritis Mother   . Hypertension Father   . Asthma Brother   . Cancer Maternal Uncle        lung  . Diabetes Maternal Grandmother   . Cancer Maternal Grandmother        thyroid  . Hypertension Maternal Grandmother   . Other Neg Hx    She indicated that her mother is alive. She indicated that her father is alive. She indicated that her brother is alive. She indicated that the status of her maternal grandmother is unknown. She indicated that the status of her maternal uncle is unknown. She indicated that the status of her neg hx is unknown.  Social History    Social History   Socioeconomic History  .  Marital status: Single    Spouse name: Not on file  . Number of children: 7  . Years of education: 24  . Highest education level: Not on file  Occupational History    Employer: [employer]  Tobacco Use  . Smoking status: Never Smoker  . Smokeless tobacco: Never Used  Vaping Use  . Vaping Use: Never used  Substance and Sexual Activity  . Alcohol use: Not Currently    Alcohol/week: 2.0 standard drinks    Types: 1 Glasses of wine, 1 Shots of liquor per week    Comment: Not currently. Last use May 2020  . Drug use: Not Currently    Types: Marijuana    Comment: Not currently. Last use 2017  . Sexual activity: Yes    Birth control/protection: None  Other Topics Concern  . Not  on file  Social History Narrative   Lives at home with four children   Drinks soda occasionally    Social Determinants of Health   Financial Resource Strain:   . Difficulty of Paying Living Expenses:   Food Insecurity:   . Worried About Charity fundraiser in the Last Year:   . Arboriculturist in the Last Year:   Transportation Needs:   . Film/video editor (Medical):   Marland Kitchen Lack of Transportation (Non-Medical):   Physical Activity:   . Days of Exercise per Week:   . Minutes of Exercise per Session:   Stress:   . Feeling of Stress :   Social Connections:   . Frequency of Communication with Friends and Family:   . Frequency of Social Gatherings with Friends and Family:   . Attends Religious Services:   . Active Member of Clubs or Organizations:   . Attends Archivist Meetings:   Marland Kitchen Marital Status:   Intimate Partner Violence:   . Fear of Current or Ex-Partner:   . Emotionally Abused:   Marland Kitchen Physically Abused:   . Sexually Abused:      Review of Systems    General:  No chills, fever, night sweats or weight changes.  Cardiovascular:  No chest pain, dyspnea on exertion, edema, orthopnea, palpitations, paroxysmal nocturnal dyspnea. Dermatological: No rash, lesions/masses Respiratory: No cough, dyspnea Urologic: No hematuria, dysuria Abdominal:   No nausea, vomiting, diarrhea, bright red blood per rectum, melena, or hematemesis Neurologic:  No visual changes, wkns, changes in mental status. All other systems reviewed and are otherwise negative except as noted above.  Physical Exam    VS:  There were no vitals taken for this visit. , BMI There is no height or weight on file to calculate BMI. GEN: Well nourished, well developed, in no acute distress. HEENT: normal. Neck: Supple, no JVD, carotid bruits, or masses. Cardiac: RRR, no murmurs, rubs, or gallops. No clubbing, cyanosis, edema.  Radials/DP/PT 2+ and equal bilaterally.  Respiratory:  Respirations regular and  unlabored, clear to auscultation bilaterally. GI: Soft, nontender, nondistended, BS + x 4. MS: no deformity or atrophy. Skin: warm and dry, no rash. Neuro:  Strength and sensation are intact. Psych: Normal affect.  Accessory Clinical Findings    ECG personally reviewed by me today- *** - No acute changes  Assessment & Plan   1.  ***  Jossie Ng. Greysen Swanton NP-C    05/24/2020, 7:52 AM [address] Office [phone] Fax [phone]

## 2020-05-25 ENCOUNTER — Ambulatory Visit: Payer: Medicaid Other | Admitting: General Practice
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
## 2020-06-19 ENCOUNTER — Ambulatory Visit: Payer: Medicaid Other | Admitting: General Practice

## 2020-07-20 ENCOUNTER — Ambulatory Visit: Payer: Medicaid Other | Admitting: Cardiovascular Disease

## 2020-07-20 NOTE — Progress Notes (Deleted)
Cardiology Office Note   Date:  07/20/2020   ID:  [name] [name] 10/08/1986, MRN [id number]  PCP:  Wilber Oliphant, MD  Cardiologist:   Skeet Latch, MD   No chief complaint on file.   History of Present Illness: [name] [name] is a 34 y.o. pregnant female with morbid obesity who is being seen today for the evaluation of tachycardia and syncope at the request of Wilber Oliphant, MD.  She reports 4-5 episodes of syncope since May or June.  The episodes typically occur after standing for a few minutes.  She starts seeing spots and a bright light.  She then feels like she is trying to hear underwater.  She is unsure how long she is unconscious.  She has no tonic-clonic movement, loss of bowel or bladder.  There is no post-ictal confusion.  She reports that she is eating and drinking well.  On questioning she typically eats two meals daily and has 1-2 snacks.  She drinks the equivalent of 3 water bottles daily.  There is no associated chest pain, shortness of breath, lower extremity edema, orthopnea, or PND.  She is currently [redacted] weeks pregnant with twins.  She has no syncope or presyncope prior to her current pregnancy.  [name] [name] does not get much exercise.  She notes that she snores loudly and that it has been worse since being pregnant.  He mother notes that she often stops breathing when she sleeps.  She has no orthopnea or PND.  Her thyroid function has been hyperthyroid during pregnancy but most recently normalized.  In general she doesn't have palpitations but does feel her heart racing with exertion.  She has no heart racing prior to her syncopal episodes.     Past Medical History:  Diagnosis Date  . Abnormal Pap smear    f/u was normal  . Chronic hypertension   . Chronic hypertension   . DEPRESSION, MAJOR, RECURRENT, MODERATE 04/18/2010   Qualifier: Diagnosis of  By: Georgina Snell MD, Ellard Artis    . Headache(784.0)   . Herpes simplex virus (HSV) infection   . History of abnormal Pap smear  07/28/2012  . History of preterm delivery 11/04/2018  . Infection    trich, chlamydia, gonorrhea  . Ovarian cyst   . PTSD (post-traumatic stress disorder)   . Right atrial mass   . Urinary tract infection     Past Surgical History:  Procedure Laterality Date  . CESAREAN SECTION    . CESAREAN SECTION N/A 09/23/2018   Procedure: CESAREAN SECTION;  Surgeon: Mora Bellman, MD;  Location: [facility];  Service: Obstetrics;  Laterality: N/A;  . CHOLECYSTECTOMY N/A 05/10/2020   Procedure: LAPAROSCOPIC CHOLECYSTECTOMY;  Surgeon: Ralene Ok, MD;  Location: [place];  Service: General;  Laterality: N/A;  . TEE WITHOUT CARDIOVERSION N/A 12/05/2019   Procedure: TRANSESOPHAGEAL ECHOCARDIOGRAM (TEE);  Surgeon: Donato Heinz, MD;  Location: [facility] ENDOSCOPY;  Service: Endoscopy;  Laterality: N/A;  . TONSILLECTOMY    . WISDOM TOOTH EXTRACTION       Current Outpatient Medications  Medication Sig Dispense Refill  . acetaminophen (TYLENOL) 500 MG tablet Take 1-2 tablets (500-1,000 mg total) by mouth every 6 (six) hours as needed for mild pain. 100 tablet 2  . ibuprofen (ADVIL) 200 MG tablet Take 2-3 tablets (400-600 mg total) by mouth every 6 (six) hours as needed for mild pain.    Marland Kitchen ibuprofen (ADVIL) 600 MG tablet Take 1 tablet (600 mg total) by mouth every 8 (  eight) hours as needed for mild pain. (Patient not taking: Reported on 01/31/2020) 30 tablet 0  . oxyCODONE (OXY IR/ROXICODONE) 5 MG immediate release tablet Take 1 tablet (5 mg total) by mouth every 6 (six) hours as needed for severe pain. 15 tablet 0  . polyethylene glycol (MIRALAX) 17 g packet Take 17 g by mouth daily as needed for mild constipation. 14 each 0   No current facility-administered medications for this visit.    Allergies:   Patient has no known allergies.    Social History:  The patient  reports that she has never smoked. She has never used smokeless tobacco. She reports previous alcohol use of about 2.0 standard  drinks of alcohol per week. She reports previous drug use. Drug: Marijuana.   Family History:  The patient's family history includes Arthritis in her mother; Asthma in her brother; Cancer in her maternal grandmother and maternal uncle; Diabetes in her maternal grandmother; Hypertension in her father, maternal grandmother, and mother.    ROS:  Please see the history of present illness.   Otherwise, review of systems are positive for none.   All other systems are reviewed and negative.    PHYSICAL EXAM: VS:  There were no vitals taken for this visit. , BMI There is no height or weight on file to calculate BMI.  Sitting 110/72 Standing 106/70  GENERAL:  Well appearing HEENT:  Pupils equal round and reactive, fundi not visualized, oral mucosa unremarkable NECK:  No jugular venous distention, waveform within normal limits, carotid upstroke brisk and symmetric, no bruits, no thyromegaly LYMPHATICS:  No cervical adenopathy LUNGS:  Clear to auscultation bilaterally HEART: Tachycardic.  Regular rhythm.  PMI not displaced or sustained,S1 and S2 within normal limits, no S3, no S4, no clicks, no rubs, no murmurs ABD:  Flat, positive bowel sounds normal in frequency in pitch, no bruits, no rebound, no guarding, no midline pulsatile mass, no hepatomegaly, no splenomegaly EXT:  2 plus pulses throughout, trace edema, no cyanosis no clubbing SKIN:  No rashes no nodules NEURO:  Cranial nerves II through XII grossly intact, motor grossly intact throughout PSYCH:  Cognitively intact, oriented to person place and time   EKG:  EKG is not ordered today.   Recent Labs: 05/09/2020: ALT 40; BUN 14; Creatinine, Ser 0.90; Hemoglobin 11.8; Platelets 364; Potassium 3.9; Sodium 134    Lipid Panel No results found for: CHOL, TRIG, HDL, CHOLHDL, VLDL, LDLCALC, LDLDIRECT    Wt Readings from Last 3 Encounters:  05/10/20 231 lb 7.7 oz (105 kg)  02/17/20 231 lb 7.7 oz (105 kg)  02/01/20 232 lb (105.2 kg)       ASSESSMENT AND PLAN:  # Recurrent syncope:  [name] [name] was hypotensive after standing for 3 minutes.  Her symptoms are likely 2/2 orthostasis.  She is not drinking well.  Recommended that she increase her fluid intake to 6-8 water bottles daily.  We will check an echo to assess for any structural abnormalities.  # Tachycardia:  Likely 2/2 mild hyperthyroidism, pregnancy, and intravascular volume depletion as above.  Increase fluid intake and check echo as above.     Current medicines are reviewed at length with the patient today.  The patient does not have concerns regarding medicines.  The following changes have been made:  no change  Labs/ tests ordered today include:  No orders of the defined types were placed in this encounter.    Disposition:   FU with Lala Been C. Oval Linsey, MD, [facility] in  1 month.     Signed, Cortlynn Hollinsworth C. Oval Linsey, MD, [facility]  07/20/2020 8:03 AM    [place]

## 2020-11-02 ENCOUNTER — Telehealth: Payer: Self-pay | Admitting: Physician Assistant

## 2020-11-02 NOTE — Telephone Encounter (Signed)
Called and spoke with patient. She has been having intermittent chest non-radiating chest tightness for the past 2-3 months. Sounds like it is a rest and not necessarily associated with activity but she does have dyspnea with exertion and states she feels like this has gotten worse over the last 2-3 months. Chest pain atypical at times in the fact that it is sometimes reproducible with palpation of chest wall and sometimes pleuritic in nature. However, she does not some associated nausea/vomting with it at times. No shortness of breath at rest. Last episodes of chest pain was earlier today. She is currently chest pain free. She has already been scheduled an appointment with Coletta Memos, NP, on 11/16/2020 (earliest appointment). Recommended going to the ED for further evaluation if she has any recurrent episodes of chest pain prior to this visit. She voiced understanding and agreed. Offered prescription of sublingual Nitro but she would like to wait until she is officially evaluated.  Darreld Mclean, PA-C 11/02/2020 3:54 PM

## 2020-11-02 NOTE — Telephone Encounter (Signed)
Pt c/o of Chest Pain: STAT if CP now or developed within 24 hours  1. Are you having CP right now? Not at this time*  2. Are you experiencing any other symptoms (ex. SOB, nausea, vomiting, sweating)? Yes all of these at sometime  3. How long have you been experiencing CP? about 2 months  4. Is your CP continuous or coming and going? Comes and goes  5. Have you taken Nitroglycerin? No- pt wants an appt- appt with with Coletta Memos on 11-16-20- firdt ?

## 2020-11-02 NOTE — Telephone Encounter (Signed)
I am helping in triage today. Left message to call back. Recommended going to ED if she is having active chest pain. Of note, she has an appointment with Denyse Amass on 11/16/2020 at 2:15pm.  Darreld Mclean, PA-C 11/02/2020 2:17 PM

## 2020-11-02 NOTE — Telephone Encounter (Signed)
Patient is returning call.  °
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
## 2020-11-16 ENCOUNTER — Ambulatory Visit (INDEPENDENT_AMBULATORY_CARE_PROVIDER_SITE_OTHER): Payer: Medicaid Other | Admitting: General Practice

## 2020-11-16 ENCOUNTER — Encounter: Payer: Self-pay | Admitting: General Practice

## 2020-11-16 ENCOUNTER — Other Ambulatory Visit: Payer: Self-pay

## 2020-11-16 VITALS — BP 126/74 | HR 69 | Ht 61.0 in | Wt 252.8 lb

## 2020-11-16 DIAGNOSIS — I5189 Other ill-defined heart diseases: Secondary | ICD-10-CM

## 2020-11-16 DIAGNOSIS — R079 Chest pain, unspecified: Secondary | ICD-10-CM

## 2020-11-16 DIAGNOSIS — I10 Essential (primary) hypertension: Secondary | ICD-10-CM | POA: Diagnosis not present

## 2020-11-16 NOTE — Patient Instructions (Addendum)
Medication Instructions:  The current medical regimen is effective;  continue present plan and medications as directed. Please refer to the Current Medication list given to you today.  *If you need a refill on your cardiac medications before your next appointment, please call your pharmacy*  Lab Work: NONE If you have labs (blood work) drawn today and your tests are completely normal, you will receive your results only by:  [facility] (if you have MyChart) OR A paper copy in the mail.  If you have any lab test that is abnormal or we need to change your treatment, we will call you to review the results. You may go to any [facility] that is convenient for you however, we do have a lab in our office that is able to assist you. You DO NOT need an appointment for our lab. The lab is open 8:00am and closes at 4:00pm. Lunch 12:45 - 1:45pm.  Testing/Procedures: Your physician has requested that you have a Cardiac MRI uses a computer to create images of your heart as its beating, producing both still and moving pictures of your heart and major blood vessels. For further information please visit [email or web address] Please follow the instruction sheet given to you today for more information.   Special Instructions PLEASE READ AND FOLLOW SALTY 6-ATTACHED-1,800 mg daily  PLEASE INCREASE PHYSICAL ACTIVITY AS TOLERATED  Follow-Up: Your next appointment:  After MRI  In Person with Skeet Latch, MD OR IF UNAVAILABLE [place], FNP-C   At [facility], you and your health needs are our priority.  As part of our continuing mission to provide you with exceptional heart care, we have created designated Provider Care Teams.  These Care Teams include your primary Cardiologist (physician) and Advanced Practice Providers (APPs -  Physician Assistants and Nurse Practitioners) who all work together to provide you with the care you need, when you need it.

## 2020-11-29 ENCOUNTER — Other Ambulatory Visit: Payer: Self-pay | Admitting: *Deleted

## 2020-11-29 ENCOUNTER — Telehealth: Payer: Self-pay | Admitting: General Practice

## 2020-11-29 ENCOUNTER — Encounter: Payer: Self-pay | Admitting: General Practice

## 2020-11-29 DIAGNOSIS — I5189 Other ill-defined heart diseases: Secondary | ICD-10-CM

## 2020-11-29 NOTE — Telephone Encounter (Signed)
Left message for patient to call and discuss appointment scheduled 12/26/20 Vic Ripper the Cardiac MRI ordered by Edd Fabian, NP

## 2020-11-29 NOTE — Telephone Encounter (Signed)
Spoke with patient regarding appointment scheduled 12/26/20 at 8:00 am at [facility] for the Cardiac MRI ordered by Melburn Popper, NP---arrival time is 7:45 am --1st floor admissions office.  Will mail information to patient and it is available in My Chart.  Patient voiced  Her understanding.

## 2020-12-01 NOTE — L&D Delivery Note (Signed)
OB/GYN Faculty Practice Delivery Note  Alba Twedt is a 35 y.o. [id number] s/p vaginal delivery at [redacted]w[redacted]d She was admitted for IOL for poorly controlled HTN and history of atrial myxoma.    ROM: 1h 430mith clear fluid GBS Status:  Negative/-- (08/15 1000) Maximum Maternal Temperature:  Temp (24hrs), Avg:98.3 F (36.8 C), Min:98.1 F (36.7 C), Max:98.7 F (37.1 C)    Labor Progress: Patient arrived at 3.5 cm cm dilation and was induced with Pitocin and AROM.   Delivery Date/Time: 08/04/2021 at 0007 Delivery: Called to room and patient was complete and pushing.  Delivered precipitously and I had just arrived to the room when the infant delivered.  Delivered by nursing staff.  Infant with spontaneous cry, placed on mother's abdomen, dried and stimulated. Cord clamped x 2 after 1-minute delay, and cut by the patient. Cord blood drawn. Placenta delivered spontaneously with gentle cord traction. Fundus firm with massage and Pitocin. Labia, perineum, vagina, and cervix inspected with no lacerations.   Placenta: Spontaneous, intact, three-vessel cord, multiple infarcts noted, marginal cord insertion Complications: None Lacerations: None EBL: 50 mL Analgesia: Epidural  Infant: APGAR (1 MIN): 9   APGAR (5 MINS): 9   APGAR (10 MINS):    Weight: Pending  ViGifford ShaveMD  PGY-3, [facility]  08/04/2021 12:58 AM

## 2020-12-24 ENCOUNTER — Telehealth (HOSPITAL_COMMUNITY): Payer: Self-pay | Admitting: *Deleted

## 2020-12-24 NOTE — Telephone Encounter (Signed)
Correction to previous noted, called pt about her cardiac MRI.

## 2020-12-24 NOTE — Telephone Encounter (Signed)
Attempted to call patient regarding upcoming cardiac CT appointment. Unable to leave a message for patient. Will attempt again.  Gordy Clement RN Navigator Cardiac Imaging [facility] [phone] Office [phone] Cell

## 2020-12-25 ENCOUNTER — Telehealth (HOSPITAL_COMMUNITY): Payer: Self-pay | Admitting: *Deleted

## 2020-12-25 NOTE — Telephone Encounter (Signed)
Attempted to call patient regarding upcoming cardiac MRI appointment. Left message on voicemail with name and callback number  Jushua Waltman RN Navigator Cardiac Imaging [place] [phone] Office [phone] Cell  

## 2020-12-26 ENCOUNTER — Other Ambulatory Visit: Payer: Self-pay

## 2020-12-26 ENCOUNTER — Ambulatory Visit (HOSPITAL_COMMUNITY)
Admission: RE | Admit: 2020-12-26 | Discharge: 2020-12-26 | Disposition: A | Discharge status: Home / Self Care | Payer: Medicaid Other | Source: Ambulatory Visit | Attending: General Practice | Admitting: General Practice

## 2020-12-26 DIAGNOSIS — I5189 Other ill-defined heart diseases: Secondary | ICD-10-CM

## 2020-12-26 HISTORY — DX: Other ill-defined heart diseases: I51.89

## 2020-12-26 MED ORDER — GADOBUTROL 1 MMOL/ML IV SOLN
10.0000 mL | Freq: Once | INTRAVENOUS | Status: AC | PRN
  Administered 2020-12-26: 10 mL via INTRAVENOUS

## 2020-12-27 ENCOUNTER — Telehealth: Payer: Self-pay | Admitting: General Practice

## 2020-12-27 NOTE — Telephone Encounter (Signed)
Left message for patient to call and schedule follow up appointment with Dr. Oval Linsey or Coletta Memos to discuss Cardiac MRI results

## 2020-12-31 ENCOUNTER — Other Ambulatory Visit: Payer: Self-pay

## 2020-12-31 DIAGNOSIS — I5189 Other ill-defined heart diseases: Secondary | ICD-10-CM

## 2021-01-09 ENCOUNTER — Telehealth: Payer: Self-pay | Admitting: *Deleted

## 2021-01-09 NOTE — Telephone Encounter (Signed)
left vm for patient to return my phone call to schedule appointment, tried several times one phone number not working
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
## 2021-01-18 ENCOUNTER — Other Ambulatory Visit: Payer: Self-pay

## 2021-01-18 ENCOUNTER — Encounter: Payer: Self-pay | Admitting: General Practice

## 2021-01-18 ENCOUNTER — Ambulatory Visit (INDEPENDENT_AMBULATORY_CARE_PROVIDER_SITE_OTHER): Payer: Medicaid Other | Admitting: General Practice

## 2021-01-18 VITALS — BP 120/86 | HR 90 | Ht 62.0 in | Wt 261.2 lb

## 2021-01-18 DIAGNOSIS — I5189 Other ill-defined heart diseases: Secondary | ICD-10-CM | POA: Diagnosis not present

## 2021-01-18 DIAGNOSIS — I10 Essential (primary) hypertension: Secondary | ICD-10-CM | POA: Diagnosis not present

## 2021-01-18 DIAGNOSIS — R079 Chest pain, unspecified: Secondary | ICD-10-CM

## 2021-01-18 NOTE — Patient Instructions (Signed)
Medication Instructions:  The current medical regimen is effective;  continue present plan and medications as directed. Please refer to the Current Medication list given to you today.\ *If you need a refill on your cardiac medications before your next appointment, please call your pharmacy*  Lab Work:   Testing/Procedures:  NONE    NONE  Special Instructions REFER TO CARDIO-THORACIC FOR SURGERY  PLEASE READ AND FOLLOW SLEEP TIPS-ATTACHED  Follow-Up: Your next appointment:  6 month(s) In Person with Skeet Latch, MD ONLY   Please call our office 2 months in advance to schedule this appointment   At [facility], you and your health needs are our priority.  As part of our continuing mission to provide you with exceptional heart care, we have created designated Provider Care Teams.  These Care Teams include your primary Cardiologist (physician) and Advanced Practice Providers (APPs -  Physician Assistants and Nurse Practitioners) who all work together to provide you with the care you need, when you need it.    Quality Sleep Information, Adult Quality sleep is important for your mental and physical health. It also improves your quality of life. Quality sleep means you:  Are asleep for most of the time you are in bed.  Fall asleep within 30 minutes.  Wake up no more than once a night.  Are awake for no longer than 20 minutes if you do wake up during the night. Most adults need 7-8 hours of quality sleep each night. How can poor sleep affect me? If you do not get enough quality sleep, you may have:  Mood swings.  Daytime sleepiness.  Confusion.  Decreased reaction time.  Sleep disorders, such as insomnia and sleep apnea.  Difficulty with: ? Solving problems. ? Coping with stress. ? Paying attention. These issues may affect your performance and productivity at work, school, and at home. Lack of sleep may also put you at higher risk for accidents, suicide, and risky  behaviors. If you do not get quality sleep you may also be at higher risk for several health problems, including:  Infections.  Type 2 diabetes.  Heart disease.  High blood pressure.  Obesity.  Worsening of long-term conditions, like arthritis, kidney disease, depression, Parkinson's disease, and epilepsy. What actions can I take to get more quality sleep?  Stick to a sleep schedule. Go to sleep and wake up at about the same time each day. Do not try to sleep less on weekdays and make up for lost sleep on weekends. This does not work.  Try to get about 30 minutes of exercise on most days. Do not exercise 2-3 hours before going to bed.  Limit naps during the day to 30 minutes or less.  Do not use any products that contain nicotine or tobacco, such as cigarettes or e-cigarettes. If you need help quitting, ask your health care provider.  Do not drink caffeinated beverages for at least 8 hours before going to bed. Coffee, tea, and some sodas contain caffeine.  Do not drink alcohol close to bedtime.  Do not eat large meals close to bedtime.  Do not take naps in the late afternoon.  Try to get at least 30 minutes of sunlight every day. Morning sunlight is best.  Make time to relax before bed. Reading, listening to music, or taking a hot bath promotes quality sleep.  Make your bedroom a place that promotes quality sleep. Keep your bedroom dark, quiet, and at a comfortable room temperature. Make sure your bed is  comfortable. Take out sleep distractions like TV, a computer, smartphone, and bright lights.  If you are lying awake in bed for longer than 20 minutes, get up and do a relaxing activity until you feel sleepy.  Work with your health care provider to treat medical conditions that may affect sleeping, such as: ? Nasal obstruction. ? Snoring. ? Sleep apnea and other sleep disorders.  Talk to your health care provider if you think any of your prescription medicines may cause  you to have difficulty falling or staying asleep.  If you have sleep problems, talk with a sleep consultant. If you think you have a sleep disorder, talk with your health care provider about getting evaluated by a specialist.      Where to find more information  [place] website: https://sleepfoundation.org  National Heart, Lung, and [place] ([place]): http://www.saunders.info/.pdf  Centers for Disease Control and Prevention (CDC): LearningDermatology.pl Contact a health care provider if you:  Have trouble getting to sleep or staying asleep.  Often wake up very early in the morning and cannot get back to sleep.  Have daytime sleepiness.  Have daytime sleep attacks of suddenly falling asleep and sudden muscle weakness (narcolepsy).  Have a tingling sensation in your legs with a strong urge to move your legs (restless legs syndrome).  Stop breathing briefly during sleep (sleep apnea).  Think you have a sleep disorder or are taking a medicine that is affecting your quality of sleep. Summary  Most adults need 7-8 hours of quality sleep each night.  Getting enough quality sleep is an important part of health and well-being.  Make your bedroom a place that promotes quality sleep and avoid things that may cause you to have poor sleep, such as alcohol, caffeine, smoking, and large meals.  Talk to your health care provider if you have trouble falling asleep or staying asleep. This information is not intended to replace advice given to you by your health care provider. Make sure you discuss any questions you have with your health care provider. Document Revised: 02/24/2018 Document Reviewed: 02/24/2018 Elsevier Patient Education  2021 [employer].

## 2021-03-08 ENCOUNTER — Emergency Department (HOSPITAL_COMMUNITY): Payer: Medicaid Other

## 2021-03-08 ENCOUNTER — Inpatient Hospital Stay (HOSPITAL_COMMUNITY)
Admission: EM | Admit: 2021-03-08 | Discharge: 2021-03-09 | Disposition: A | Discharge status: Home / Self Care | Payer: Medicaid Other | Attending: Obstetrics & Gynecology | Admitting: Obstetrics & Gynecology

## 2021-03-08 ENCOUNTER — Encounter (HOSPITAL_COMMUNITY): Payer: Self-pay | Admitting: Emergency Medicine

## 2021-03-08 ENCOUNTER — Other Ambulatory Visit: Payer: Self-pay

## 2021-03-08 DIAGNOSIS — Z3490 Encounter for supervision of normal pregnancy, unspecified, unspecified trimester: Secondary | ICD-10-CM

## 2021-03-08 DIAGNOSIS — O3482 Maternal care for other abnormalities of pelvic organs, second trimester: Secondary | ICD-10-CM | POA: Diagnosis not present

## 2021-03-08 DIAGNOSIS — O2340 Unspecified infection of urinary tract in pregnancy, unspecified trimester: Secondary | ICD-10-CM

## 2021-03-08 DIAGNOSIS — O3680X0 Pregnancy with inconclusive fetal viability, not applicable or unspecified: Secondary | ICD-10-CM

## 2021-03-08 DIAGNOSIS — N898 Other specified noninflammatory disorders of vagina: Secondary | ICD-10-CM

## 2021-03-08 DIAGNOSIS — R0789 Other chest pain: Secondary | ICD-10-CM | POA: Diagnosis not present

## 2021-03-08 DIAGNOSIS — O23592 Infection of other part of genital tract in pregnancy, second trimester: Secondary | ICD-10-CM | POA: Diagnosis present

## 2021-03-08 DIAGNOSIS — Z3A17 17 weeks gestation of pregnancy: Secondary | ICD-10-CM | POA: Diagnosis not present

## 2021-03-08 DIAGNOSIS — R079 Chest pain, unspecified: Secondary | ICD-10-CM

## 2021-03-08 DIAGNOSIS — R102 Pelvic and perineal pain: Secondary | ICD-10-CM

## 2021-03-08 DIAGNOSIS — O10012 Pre-existing essential hypertension complicating pregnancy, second trimester: Secondary | ICD-10-CM | POA: Insufficient documentation

## 2021-03-08 DIAGNOSIS — O2342 Unspecified infection of urinary tract in pregnancy, second trimester: Secondary | ICD-10-CM | POA: Diagnosis not present

## 2021-03-08 DIAGNOSIS — R52 Pain, unspecified: Secondary | ICD-10-CM

## 2021-03-08 DIAGNOSIS — N949 Unspecified condition associated with female genital organs and menstrual cycle: Secondary | ICD-10-CM

## 2021-03-08 LAB — CBC WITH DIFFERENTIAL/PLATELET
Abs Immature Granulocytes: 0.03 10*3/uL (ref 0.00–0.07)
Basophils Absolute: 0 10*3/uL (ref 0.0–0.1)
Basophils Relative: 0 %
Eosinophils Absolute: 0.1 10*3/uL (ref 0.0–0.5)
Eosinophils Relative: 1 %
HCT: 36.9 % (ref 36.0–46.0)
Hemoglobin: 12 g/dL (ref 12.0–15.0)
Immature Granulocytes: 0 %
Lymphocytes Relative: 32 %
Lymphs Abs: 3.1 10*3/uL (ref 0.7–4.0)
MCH: 33.9 pg (ref 26.0–34.0)
MCHC: 32.5 g/dL (ref 30.0–36.0)
MCV: 104.2 fL — ABNORMAL HIGH (ref 80.0–100.0)
Monocytes Absolute: 0.4 10*3/uL (ref 0.1–1.0)
Monocytes Relative: 4 %
Neutro Abs: 6.2 10*3/uL (ref 1.7–7.7)
Neutrophils Relative %: 63 %
Platelets: 324 10*3/uL (ref 150–400)
RBC: 3.54 MIL/uL — ABNORMAL LOW (ref 3.87–5.11)
RDW: 12.7 % (ref 11.5–15.5)
WBC: 9.9 10*3/uL (ref 4.0–10.5)
nRBC: 0 % (ref 0.0–0.2)

## 2021-03-08 LAB — COMPREHENSIVE METABOLIC PANEL
ALT: 16 U/L (ref 0–44)
AST: 15 U/L (ref 15–41)
Albumin: 3.2 g/dL — ABNORMAL LOW (ref 3.5–5.0)
Alkaline Phosphatase: 46 U/L (ref 38–126)
Anion gap: 5 (ref 5–15)
BUN: 9 mg/dL (ref 6–20)
CO2: 26 mmol/L (ref 22–32)
Calcium: 9.3 mg/dL (ref 8.9–10.3)
Chloride: 104 mmol/L (ref 98–111)
Creatinine, Ser: 0.56 mg/dL (ref 0.44–1.00)
GFR, Estimated: >60 mL/min (ref >60)
Glucose, Bld: 85 mg/dL (ref 70–99)
Potassium: 3.9 mmol/L (ref 3.5–5.1)
Sodium: 135 mmol/L (ref 135–145)
Total Bilirubin: 0.4 mg/dL (ref 0.3–1.2)
Total Protein: 6.9 g/dL (ref 6.5–8.1)

## 2021-03-08 LAB — URINALYSIS, ROUTINE W REFLEX MICROSCOPIC
Bilirubin Urine: NEGATIVE
Glucose, UA: NEGATIVE mg/dL
Ketones, ur: NEGATIVE mg/dL
Nitrite: POSITIVE — AB
Protein, ur: 30 mg/dL — AB
Specific Gravity, Urine: 1.025 (ref 1.005–1.030)
WBC, UA: >50 WBC/hpf — ABNORMAL HIGH (ref 0–5)
pH: 6 (ref 5.0–8.0)

## 2021-03-08 LAB — WET PREP, GENITAL
Clue Cells Wet Prep HPF POC: NONE SEEN
Sperm: NONE SEEN
Trich, Wet Prep: NONE SEEN

## 2021-03-08 LAB — TROPONIN I (HIGH SENSITIVITY)
Troponin I (High Sensitivity): 3 ng/L (ref <18)
Troponin I (High Sensitivity): 3 ng/L (ref <18)

## 2021-03-08 LAB — HIV ANTIBODY (ROUTINE TESTING W REFLEX): HIV Screen 4th Generation wRfx: NONREACTIVE

## 2021-03-08 MED ORDER — ALUM & MAG HYDROXIDE-SIMETH 200-200-20 MG/5ML PO SUSP
30.0000 mL | Freq: Once | ORAL | Status: AC
  Administered 2021-03-09: 30 mL via ORAL
  Filled 2021-03-08: qty 30

## 2021-03-08 MED ORDER — CEFTRIAXONE SODIUM 500 MG IJ SOLR: 500.0000 mg | Freq: Once | INTRAMUSCULAR | Status: DC

## 2021-03-08 MED ORDER — DOXYCYCLINE HYCLATE 100 MG PO TABS
100.0000 mg | ORAL_TABLET | Freq: Once | ORAL | Status: AC
  Administered 2021-03-09: 100 mg via ORAL
  Filled 2021-03-08: qty 1

## 2021-03-08 MED ORDER — DEXTROSE 5 % IV SOLN
500.0000 mg | Freq: Once | INTRAVENOUS | Status: AC
  Administered 2021-03-09: 500 mg via INTRAVENOUS
  Filled 2021-03-08: qty 500

## 2021-03-08 NOTE — ED Triage Notes (Signed)
Patient coming from home, complaint of chest pain and near syncopal episode that started yesterday.
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
## 2021-03-08 NOTE — ED Triage Notes (Signed)
Emergency Medicine Provider Triage Evaluation Note  [name] [name] , a 35 y.o. female  was evaluated in triage.  Pt complains of chest pain, near syncopal episode, dysuria.  Chest pain intermittent since yesterday.  Chest pain mid sternal, no radiation, sharp/dull pain.  Pain lasts for 10-34min and then resolves.  Chest pain and shortness of breath with exertion.  Near syncope yestersday while at work. Sees cardiologist Skeet Latch, MD   Dysuria x 1 week.  Vaginal discharge and pruritis.  Review of Systems  Positive: Chest pain, shob, near syncope, dysuria, vaginal discharge, vaginal pruritis  Negative: Fever, chills, cough, syncope, nausea, vomiting   Physical Exam  BP 137/88 (BP Location: Right Arm)   Pulse 88   Temp 98.7 F (37.1 C)   Resp 16   SpO2 100%  Gen:   Awake, no distress   HEENT:  Atraumatic  Resp:  Normal effort, lungs clear to auscultation, speaks in full complete sentences  Cardiac:  Normal rate Abd:   Nondistended, soft, nontender, obese  MSK:   Moves extremities without difficulty  Neuro:  Speech clear  Medical Decision Making  Medically screening exam initiated at 6:05 PM.  Appropriate orders placed.  Andee Chivers was informed that the remainder of the evaluation will be completed by another provider, this initial triage assessment does not replace that evaluation, and the importance of remaining in the ED until their evaluation is complete.  Clinical Impression   Chest pain, dysuria.  The patient appears stable so that the remainder of the work up may be completed by another provider.     Loni Beckwith, [place] 03/08/21 1814

## 2021-03-08 NOTE — Discharge Instructions (Addendum)
Pregnancy and Urinary Tract Infection  A urinary tract infection (UTI) is an infection of any part of the urinary tract. This includes the kidneys, the tubes that connect your kidneys to your bladder (ureters), the bladder, and the tube that carries urine out of your body (urethra). These organs make, store, and get rid of urine in the body. Your health care provider may use other names to describe the infection. An upper UTI affects the ureters and kidneys (pyelonephritis). A lower UTI affects the bladder (cystitis) and urethra (urethritis). Most urinary tract infections are caused by bacteria in your genital area, around the entrance to your urinary tract (urethra). These bacteria grow and cause irritation and inflammation of your urinary tract. You are more likely to develop a UTI during pregnancy because the physical and hormonal changes your body goes through can make it easier for bacteria to get into your urinary tract. Your growing baby also puts pressure on your bladder and can affect urine flow. It is important to recognize and treat UTIs in pregnancy because of the risk of serious complications for both you and your baby. How does this affect me? Symptoms of a UTI include:  Needing to urinate right away (urgently).  Frequent urination or passing small amounts of urine frequently.  Pain or burning with urination.  Blood in the urine.  Urine that smells bad or unusual.  Trouble urinating.  Cloudy urine.  Pain in the abdomen or lower back.  Vaginal discharge. You may also have:  Vomiting or a decreased appetite.  Confusion.  Irritability or tiredness.  A fever.  Diarrhea. How does this affect my baby? An untreated UTI during pregnancy could lead to a kidney infection or a systemic infection, which can cause health problems that could affect your baby. Possible complications of an untreated UTI include:  Giving birth to your baby before 37 weeks of pregnancy  (premature).  Having a baby with a low birth weight.  Developing high blood pressure during pregnancy (preeclampsia).  Having a low hemoglobin level (anemia). What can I do to lower my risk? To prevent a UTI:  Go to the bathroom as soon as you feel the need. Do not hold urine for long periods of time.  Always wipe from front to back, especially after a bowel movement. Use each tissue one time when you wipe.  Empty your bladder after sex.  Keep your genital area dry.  Drink 6-10 glasses of water each day.  Do not douche or use deodorant sprays. How is this treated? Treatment for this condition may include:  Antibiotic medicines that are safe to take during pregnancy.  Other medicines to treat less common causes of UTI. Follow these instructions at home:  If you were prescribed an antibiotic medicine, take it as told by your health care provider. Do not stop using the antibiotic even if you start to feel better.  Keep all follow-up visits as told by your health care provider. This is important. Contact a health care provider if:  Your symptoms do not improve or they get worse.  You have abnormal vaginal discharge. Get help right away if you:  Have a fever.  Have nausea and vomiting.  Have back or side pain.  Feel contractions in your uterus.  Have lower belly pain.  Have a gush of fluid from your vagina.  Have blood in your urine. Summary  A urinary tract infection (UTI) is an infection of any part of the urinary tract, which includes the  kidneys, ureters, bladder, and urethra.  Most urinary tract infections are caused by bacteria in your genital area, around the entrance to your urinary tract (urethra).  You are more likely to develop a UTI during pregnancy.  If you were prescribed an antibiotic medicine, take it as told by your health care provider. Do not stop using the antibiotic even if you start to feel better. This information is not intended to  replace advice given to you by your health care provider. Make sure you discuss any questions you have with your health care provider. Document Revised: 03/11/2019 Document Reviewed: 10/21/2018 Elsevier Patient Education  [place].  Safe Medications in Pregnancy   Acne: Benzoyl Peroxide Salicylic Acid  Backache/Headache: Tylenol: 2 regular strength every 4 hours OR              2 Extra strength every 6 hours  Colds/Coughs/Allergies: Benadryl (alcohol free) 25 mg every 6 hours as needed Breath right strips Claritin Cepacol throat lozenges Chloraseptic throat spray Cold-Eeze- up to three times per day Cough drops, alcohol free Flonase (by prescription only) Guaifenesin Mucinex Robitussin DM (plain only, alcohol free) Saline nasal spray/drops Sudafed (pseudoephedrine) & Actifed ** use only after [redacted] weeks gestation and if you do not have high blood pressure Tylenol Vicks Vaporub Zinc lozenges Zyrtec   Constipation: Colace Ducolax suppositories Fleet enema Glycerin suppositories Metamucil Milk of magnesia Miralax Senokot Smooth move tea  Diarrhea: Kaopectate Imodium A-D  *NO pepto Bismol  Hemorrhoids: Anusol Anusol HC Preparation H Tucks  Indigestion: Tums Maalox Mylanta Zantac  Pepcid  Insomnia: Benadryl (alcohol free) 25mg  every 6 hours as needed Tylenol PM Unisom, no Gelcaps  Leg Cramps: Tums MagGel  Nausea/Vomiting:  Bonine Dramamine Emetrol Ginger extract Sea bands Meclizine  Nausea medication to take during pregnancy:  Unisom (doxylamine succinate 25 mg tablets) Take one tablet daily at bedtime. If symptoms are not adequately controlled, the dose can be increased to a maximum recommended dose of two tablets daily (1/2 tablet in the morning, 1/2 tablet mid-afternoon and one at bedtime). Vitamin B6 100mg  tablets. Take one tablet twice a day (up to 200 mg per day).  Skin Rashes: Aveeno products Benadryl cream or 25mg  every 6  hours as needed Calamine Lotion 1% cortisone cream  Yeast infection: Gyne-lotrimin 7 Monistat 7   **If taking multiple medications, please check labels to avoid duplicating the same active ingredients **take medication as directed on the label ** Do not exceed 4000 mg of tylenol in 24 hours **Do not take medications that contain aspirin or ibuprofen

## 2021-03-09 ENCOUNTER — Encounter (HOSPITAL_COMMUNITY): Payer: Self-pay | Admitting: Obstetrics & Gynecology

## 2021-03-09 ENCOUNTER — Inpatient Hospital Stay (HOSPITAL_BASED_OUTPATIENT_CLINIC_OR_DEPARTMENT_OTHER): Payer: Medicaid Other

## 2021-03-09 ENCOUNTER — Other Ambulatory Visit (HOSPITAL_COMMUNITY): Payer: Medicaid Other

## 2021-03-09 ENCOUNTER — Emergency Department (HOSPITAL_COMMUNITY): Payer: Medicaid Other

## 2021-03-09 DIAGNOSIS — O2342 Unspecified infection of urinary tract in pregnancy, second trimester: Secondary | ICD-10-CM

## 2021-03-09 DIAGNOSIS — Z3A17 17 weeks gestation of pregnancy: Secondary | ICD-10-CM | POA: Diagnosis not present

## 2021-03-09 DIAGNOSIS — O321XX0 Maternal care for breech presentation, not applicable or unspecified: Secondary | ICD-10-CM

## 2021-03-09 DIAGNOSIS — O3680X0 Pregnancy with inconclusive fetal viability, not applicable or unspecified: Secondary | ICD-10-CM | POA: Diagnosis not present

## 2021-03-09 DIAGNOSIS — Z3490 Encounter for supervision of normal pregnancy, unspecified, unspecified trimester: Secondary | ICD-10-CM

## 2021-03-09 DIAGNOSIS — Z3687 Encounter for antenatal screening for uncertain dates: Secondary | ICD-10-CM

## 2021-03-09 LAB — RPR: RPR Ser Ql: NONREACTIVE

## 2021-03-09 LAB — PREGNANCY, URINE: Preg Test, Ur: POSITIVE — AB

## 2021-03-09 MED ORDER — ONDANSETRON HCL 4 MG/2ML IJ SOLN
INTRAMUSCULAR | Status: AC
  Administered 2021-03-09: 4 mg via INTRAVENOUS
  Filled 2021-03-09: qty 2

## 2021-03-09 MED ORDER — FLUCONAZOLE 150 MG PO TABS: 150.0000 mg | ORAL_TABLET | Freq: Every day | ORAL | 0 refills | Status: DC

## 2021-03-09 MED ORDER — ONDANSETRON HCL 4 MG/2ML IJ SOLN: 4.0000 mg | Freq: Once | INTRAMUSCULAR | Status: AC

## 2021-03-09 MED ORDER — CEPHALEXIN 500 MG PO CAPS: 500.0000 mg | ORAL_CAPSULE | Freq: Two times a day (BID) | ORAL | 0 refills | Status: DC

## 2021-03-09 MED ORDER — AZITHROMYCIN 250 MG PO TABS
1000.0000 mg | ORAL_TABLET | Freq: Once | ORAL | Status: AC
  Administered 2021-03-09: 1000 mg via ORAL
  Filled 2021-03-09: qty 4

## 2021-03-09 MED ORDER — DOXYCYCLINE HYCLATE 100 MG PO CAPS: 100.0000 mg | ORAL_CAPSULE | Freq: Two times a day (BID) | ORAL | 0 refills | Status: DC

## 2021-03-09 MED ORDER — SODIUM CHLORIDE 0.9 % IV SOLN: Freq: Once | INTRAVENOUS | Status: DC

## 2021-03-09 MED ORDER — PROMETHAZINE HCL 12.5 MG PO TABS: 12.5000 mg | ORAL_TABLET | Freq: Four times a day (QID) | ORAL | 0 refills | Status: DC | PRN

## 2021-03-09 MED ORDER — CEPHALEXIN 500 MG PO CAPS: 500.0000 mg | ORAL_CAPSULE | Freq: Four times a day (QID) | ORAL | 0 refills | Status: DC

## 2021-03-09 MED ORDER — SODIUM CHLORIDE 0.9 % IV SOLN
1.0000 g | Freq: Once | INTRAVENOUS | Status: AC
  Administered 2021-03-09: 1 g via INTRAVENOUS
  Filled 2021-03-09: qty 1

## 2021-03-09 NOTE — MAU Provider Note (Signed)
History     CSN: [id number]  Arrival date and time: 03/08/21 1745   Event Date/Time   First Provider Initiated Contact with Patient 03/09/21 0308      Chief Complaint  Patient presents with  . Chest Pain  . Vaginal Itching   HPI [name] [name] is a 35 y.o. [id number] at [redacted]w[redacted]d who presents to MAU from [facility] for evaluation of pregnancy of unknown location. Patient presented to [facility] for evaluation of chest pain and one week history of dysuria. During that evaluation she had a positive urine pregnancy test.  She underwent an informal ultrasound which confirmed she was likely in her second trimester. She was transferred to MAU to confirm dating via formal ultrasound and to further assess concern for PID.  On arrival to MAU patient denies pain, verbalizes she "will fall out on the floor" if she isn't able to rest soon.  OB History    Gravida  8   Para  7   Term  6   Preterm  1   AB  0   Living  8     SAB  0   IAB  0   Ectopic  0   Multiple  1   Live Births  8           Past Medical History:  Diagnosis Date  . Abnormal Pap smear    f/u was normal  . Chronic hypertension   . Chronic hypertension   . DEPRESSION, MAJOR, RECURRENT, MODERATE 04/18/2010   Qualifier: Diagnosis of  By: Georgina Snell MD, Ellard Artis    . Headache(784.0)   . Herpes simplex virus (HSV) infection   . History of abnormal Pap smear 07/28/2012  . History of preterm delivery 11/04/2018  . Infection    trich, chlamydia, gonorrhea  . Ovarian cyst   . PTSD (post-traumatic stress disorder)   . Right atrial mass   . Urinary tract infection     Past Surgical History:  Procedure Laterality Date  . CESAREAN SECTION    . CESAREAN SECTION N/A 09/23/2018   Procedure: CESAREAN SECTION;  Surgeon: Mora Bellman, MD;  Location: [place];  Service: Obstetrics;  Laterality: N/A;  . CHOLECYSTECTOMY N/A 05/10/2020   Procedure: LAPAROSCOPIC CHOLECYSTECTOMY;  Surgeon: Ralene Ok, MD;  Location: [place];   Service: General;  Laterality: N/A;  . TEE WITHOUT CARDIOVERSION N/A 12/05/2019   Procedure: TRANSESOPHAGEAL ECHOCARDIOGRAM (TEE);  Surgeon: Donato Heinz, MD;  Location: [facility] ENDOSCOPY;  Service: Endoscopy;  Laterality: N/A;  . TONSILLECTOMY    . WISDOM TOOTH EXTRACTION      Family History  Problem Relation Age of Onset  . Hypertension Mother   . Arthritis Mother   . Hypertension Father   . Asthma Brother   . Cancer Maternal Uncle        lung  . Diabetes Maternal Grandmother   . Cancer Maternal Grandmother        thyroid  . Hypertension Maternal Grandmother   . Other Neg Hx     Social History   Tobacco Use  . Smoking status: Never Smoker  . Smokeless tobacco: Never Used  Vaping Use  . Vaping Use: Never used  Substance Use Topics  . Alcohol use: Not Currently    Alcohol/week: 2.0 standard drinks    Types: 1 Glasses of wine, 1 Shots of liquor per week    Comment: Not currently. Last use May 2020  . Drug use: Not Currently    Types: Marijuana  Comment: Not currently. Last use 2017    Allergies:  Allergies  Allergen Reactions  . Benadryl [Diphenhydramine] Itching    Pt reports being itchy after receiving benadryl through IV    Medications Prior to Admission  Medication Sig Dispense Refill Last Dose  . acetaminophen (TYLENOL) 500 MG tablet Take 1-2 tablets (500-1,000 mg total) by mouth every 6 (six) hours as needed for mild pain. 100 tablet 2 Past Week at Unknown time  . Methenamine-Sodium Salicylate (AZO URINARY TRACT DEFENSE PO) Take 1 tablet by mouth daily.   03/08/2021 at Unknown time    Review of Systems  Constitutional: Negative for chills and fever.  Gastrointestinal: Positive for nausea and vomiting.  Genitourinary: Positive for dysuria. Negative for decreased urine volume, difficulty urinating, flank pain, hematuria, urgency and vaginal bleeding.  Musculoskeletal: Negative for back pain.  All other systems reviewed and are negative.  Physical Exam    Blood pressure 127/66, pulse (!) 118, temperature 98.9 F (37.2 C), temperature source Oral, resp. rate 20, height 5\' 2"  (1.575 m), weight 120.4 kg, SpO2 100 %, not currently breastfeeding.  Physical Exam Vitals and nursing note reviewed.  Constitutional:      Appearance: She is well-developed. She is obese.     Comments: Patient is yawning and expressing sleepiness on arrival to MAU  Cardiovascular:     Rate and Rhythm: Tachycardia present.  Pulmonary:     Effort: Pulmonary effort is normal.     Breath sounds: Normal breath sounds.  Abdominal:     Palpations: Abdomen is soft.     Tenderness: There is no abdominal tenderness. There is no right CVA tenderness or left CVA tenderness.  Skin:    General: Skin is warm and dry.     Capillary Refill: Capillary refill takes less than 2 seconds.  Neurological:     Mental Status: She is alert and oriented to person, place, and time.  Psychiatric:        Mood and Affect: Mood normal.        Behavior: Behavior normal.     MAU Course  Procedures   --Chest pain workup compoleted by [facility] prior to transfer.  --Patient received one dose of Doxycycline prior to transfer and before positive pregnancy test -- Mild hematuria, positive nitrites, rare bacteria on UA collected in [facility]. UTI diagnosed, Keflex prescribed but not at dosage for pregnancy. Will reorder for QID per policy --Pertinent negatives: elevated WBCs, CVAT, flank pain, fever --Low suspicion for Pyelo, low suspicion for PID --Discussed with Dr. Elonda Husky, patient stable for d/c home after receiving Rocephin in MAU  Patient Vitals for the past 24 hrs:  BP Temp Temp src Pulse Resp SpO2 Height Weight  03/09/21 0610 122/67 -- Oral 99 -- -- -- --  03/09/21 0306 127/66 98.9 F (37.2 C) Oral (!) 118 20 100 % 5\' 2"  (1.575 m) 120.4 kg   Results for orders placed or performed during the hospital encounter of 03/08/21 (from the past 24 hour(s))  Troponin I (High Sensitivity)     Status: None    Collection Time: 03/08/21  6:35 PM  Result Value Ref Range   Troponin I (High Sensitivity) 3 <18 ng/L  Comprehensive metabolic panel     Status: Abnormal   Collection Time: 03/08/21  6:35 PM  Result Value Ref Range   Sodium 135 135 - 145 mmol/L   Potassium 3.9 3.5 - 5.1 mmol/L   Chloride 104 98 - 111 mmol/L   CO2 26 22 -  32 mmol/L   Glucose, Bld 85 70 - 99 mg/dL   BUN 9 6 - 20 mg/dL   Creatinine, Ser 0.56 0.44 - 1.00 mg/dL   Calcium 9.3 8.9 - 10.3 mg/dL   Total Protein 6.9 6.5 - 8.1 g/dL   Albumin 3.2 (L) 3.5 - 5.0 g/dL   AST 15 15 - 41 U/L   ALT 16 0 - 44 U/L   Alkaline Phosphatase 46 38 - 126 U/L   Total Bilirubin 0.4 0.3 - 1.2 mg/dL   GFR, Estimated >60 >60 mL/min   Anion gap 5 5 - 15  Urinalysis, Routine w reflex microscopic Urine, Clean Catch     Status: Abnormal   Collection Time: 03/08/21  6:35 PM  Result Value Ref Range   Color, Urine AMBER (A) YELLOW   APPearance HAZY (A) CLEAR   Specific Gravity, Urine 1.025 1.005 - 1.030   pH 6.0 5.0 - 8.0   Glucose, UA NEGATIVE NEGATIVE mg/dL   Hgb urine dipstick SMALL (A) NEGATIVE   Bilirubin Urine NEGATIVE NEGATIVE   Ketones, ur NEGATIVE NEGATIVE mg/dL   Protein, ur 30 (A) NEGATIVE mg/dL   Nitrite POSITIVE (A) NEGATIVE   Leukocytes,Ua MODERATE (A) NEGATIVE   RBC / HPF 11-20 0 - 5 RBC/hpf   WBC, UA >50 (H) 0 - 5 WBC/hpf   Bacteria, UA RARE (A) NONE SEEN   Squamous Epithelial / LPF 0-5 0 - 5   Mucus PRESENT   Pregnancy, urine     Status: Abnormal   Collection Time: 03/08/21  6:35 PM  Result Value Ref Range   Preg Test, Ur POSITIVE (A) NEGATIVE  CBC with Differential     Status: Abnormal   Collection Time: 03/08/21  8:38 PM  Result Value Ref Range   WBC 9.9 4.0 - 10.5 K/uL   RBC 3.54 (L) 3.87 - 5.11 MIL/uL   Hemoglobin 12.0 12.0 - 15.0 g/dL   HCT 36.9 36.0 - 46.0 %   MCV 104.2 (H) 80.0 - 100.0 fL   MCH 33.9 26.0 - 34.0 pg   MCHC 32.5 30.0 - 36.0 g/dL   RDW 12.7 11.5 - 15.5 %   Platelets 324 150 - 400 K/uL   nRBC  0.0 0.0 - 0.2 %   Neutrophils Relative % 63 %   Neutro Abs 6.2 1.7 - 7.7 K/uL   Lymphocytes Relative 32 %   Lymphs Abs 3.1 0.7 - 4.0 K/uL   Monocytes Relative 4 %   Monocytes Absolute 0.4 0.1 - 1.0 K/uL   Eosinophils Relative 1 %   Eosinophils Absolute 0.1 0.0 - 0.5 K/uL   Basophils Relative 0 %   Basophils Absolute 0.0 0.0 - 0.1 K/uL   Immature Granulocytes 0 %   Abs Immature Granulocytes 0.03 0.00 - 0.07 K/uL  HIV Antibody (routine testing w rflx)     Status: None   Collection Time: 03/08/21  8:38 PM  Result Value Ref Range   HIV Screen 4th Generation wRfx Non Reactive Non Reactive  RPR     Status: None   Collection Time: 03/08/21  8:38 PM  Result Value Ref Range   RPR Ser Ql NON REACTIVE NON REACTIVE  Troponin I (High Sensitivity)     Status: None   Collection Time: 03/08/21  9:01 PM  Result Value Ref Range   Troponin I (High Sensitivity) 3 <18 ng/L  Wet prep, genital     Status: Abnormal   Collection Time: 03/08/21 10:57 PM   Specimen: Cervix  Result Value Ref Range   Yeast Wet Prep HPF POC PRESENT (A) NONE SEEN   Trich, Wet Prep NONE SEEN NONE SEEN   Clue Cells Wet Prep HPF POC NONE SEEN NONE SEEN   WBC, Wet Prep HPF POC MANY (A) NONE SEEN   Sperm NONE SEEN    DG Chest 2 View  Result Date: 03/08/2021 CLINICAL DATA:  Chest pain and near syncope. EXAM: CHEST - 2 VIEW COMPARISON:  Radiograph 02/17/2020 FINDINGS: The cardiomediastinal contours are normal. The lungs are clear. Pulmonary vasculature is normal. No consolidation, pleural effusion, or pneumothorax. No acute osseous abnormalities are seen. IMPRESSION: Negative radiographs of the chest. Electronically Signed   By: Keith Rake M.D.   On: 03/08/2021 19:12   [place] MFM OB COMP + 14 WK  Result Date: 03/09/2021 ----------------------------------------------------------------------  OBSTETRICS REPORT                       (Signed Final 03/09/2021 04:13 pm)  ---------------------------------------------------------------------- Patient Info  ID #:       [id number]                          D.O.B.:  Dec 23, 1985 (34 yrs)  Name:       [name] [name]                 Visit Date: 03/09/2021 03:34 am ---------------------------------------------------------------------- Performed By  Attending:        Johnell Comings MD         Referred By:       [facility] MAU/Triage  Performed By:     Hubert Azure          [facility]                    [place] ---------------------------------------------------------------------- Orders  #  Description                           Code        Ordered By  1  [place] MFM OB COMP + 14 WK                35456.25    Mallie Snooks ----------------------------------------------------------------------  #  Order #                     Accession #                Episode #  1  [id number]                   [id number]                 [id number] ---------------------------------------------------------------------- Indications  Encounter for uncertain dates  Z36.87  [redacted] weeks gestation of pregnancy                 Z3A.17 ---------------------------------------------------------------------- Fetal Evaluation  Num Of Fetuses:          1  Fetal Heart Rate(bpm):   155  Cardiac Activity:        Observed  Presentation:            Breech  Placenta:                Posterior  P. Cord Insertion:       Visualized  Amniotic Fluid  AFI FV:      Within normal limits                              Largest Pocket(cm)                              5.1  Comment:    Stomach, bladder, and diaphragm visualized. No placental              abruption or previa identified. ---------------------------------------------------------------------- Biometry  BPD:      38.7  mm     G. Age:  17w 5d         47  %    CI:        75.44   %    70 - 86                                                           FL/HC:       17.5  %    15.8 - 18  HC:      141.3  mm     G. Age:  17w 3d         21  %    HC/AC:       1.08       1.07 - 1.29  AC:       131   mm     G. Age:  18w 4d         73  %    FL/BPD:      63.8  %  FL:       24.7  mm     G. Age:  17w 3d         30  %    FL/AC:       18.9  %    20 - 24  HUM:      24.4  mm     G. Age:  17w 4d         48  %  Est. FW:     219   gm     0 lb 8 oz     54  % ---------------------------------------------------------------------- Gestational Age  U/S Today:     17w 6d                                        EDD:   08/11/21  Best:          Isaac Bliss  6d     Det. By:  U/S (03/09/21)           EDD:   08/11/21 ---------------------------------------------------------------------- Cervix Uterus Adnexa  Cervix  Length:           3.75  cm.  Normal appearance by transabdominal scan.  Uterus  No abnormality visualized.  Right Ovary  Not visualized.  Left Ovary  Simple cyst measuring. (3.5x2.6x3.1cm) ---------------------------------------------------------------------- Comments  This patient presented to the MAU due to chest pain. Her  EDC is unknown.  The fetal biometery measurements obtained today shows an  EDC of 08/11/2021.  The views of the fetal anatomy were limited today.  She should have another ultrasound performed in the MFM  office in 4 weeks to assess the fetal anatomy and to confirm  her dates. ----------------------------------------------------------------------                   Johnell Comings, MD Electronically Signed Final Report   03/09/2021 04:13 pm ----------------------------------------------------------------------  Meds ordered this encounter  Medications  . azithromycin (ZITHROMAX) tablet 1,000 mg (Given in [facility])  . DISCONTD: cephALEXin (KEFLEX) 500 MG capsule    Sig: Take 1 capsule (500 mg total) by mouth 2 (two) times daily for 7 days.    Dispense:  14 capsule    Refill:  0    Order Specific Question:   Supervising Provider    Answer:    MILLER, BRIAN [[id number]]  . cefTRIAXone (ROCEPHIN) 1 g in sodium chloride 0.9 % 100 mL IVPB    Order Specific Question:   Antibiotic Indication:    Answer:   UTI  . DISCONTD: 0.9 %  sodium chloride infusion  . ondansetron (ZOFRAN) injection 4 mg  . ondansetron (ZOFRAN) 4 MG/2ML injection    Rollene Rotunda, [place]  : cabinet override  . cephALEXin (KEFLEX) 500 MG capsule    Sig: Take 1 capsule (500 mg total) by mouth 4 (four) times daily.    Dispense:  28 capsule    Refill:  0    Order Specific Question:   Supervising Provider    Answer:   EURE, LUTHER H [[id number]]  . promethazine (PHENERGAN) 12.5 MG tablet    Sig: Take 1 tablet (12.5 mg total) by mouth every 6 (six) hours as needed for nausea or vomiting.    Dispense:  30 tablet    Refill:  0    Order Specific Question:   Supervising Provider    Answer:   Florian Buff [[id number]]    Assessment and Plan  --35 y.o..[id number] at [redacted]w[redacted]d by formal MFM scan --UTI, initiate Keflex --Urine culture collected in MAU --S/p Rocephin IV in MAU --Patient denies pain throughout period of evaluation in MAU --Given work note documenting time on campus, return to work Sunday 03/10/2021 --Discharge home in stable condition  Darlina Rumpf, CNM 03/09/2021, 4:42 PM

## 2021-03-09 NOTE — MAU Note (Signed)
PT SAYS SHE WENT TO [place]  AT 530PM -FOR CHEST PAIN - AND VAG BURNING -

## 2021-03-09 NOTE — ED Notes (Signed)
Transport at bedside at this time. Patient in bathroom. Patient ambulatory to wheelchair with steady gait in NAD. Transported to MAU at this time.

## 2021-03-09 NOTE — ED Provider Notes (Signed)
12:04 AM Care assumed at shift change from [place], PA-C. Requested by [place] to have pregnancy test completed prior to imaging. This has been added. Results pending.  12:43 AM Pregnancy test positive. [place] changed from pelvic to OB ultrasound.  2:15 AM Ultrasound came to assess patient and she was found to be beyond the first trimester.  Ultrasonographer estimates that patient is at least [redacted] weeks gestation.  Results have been discussed with the patient; reports hx of 8 prior pregnancies.  She is not actively followed by an OBGYN at this time.  Discussed case with APP at MAU who has accepted the patient for more thorough ultrasound evaluation. Advises azithromycin given STI concern in pregnancy. Will send Rx to pharmacy for Keflex for UTI.   Antonietta Breach, PA-C 70/34/03 5248    Delora Fuel, MD 18/59/09 [phone]

## 2021-03-10 LAB — CULTURE, OB URINE

## 2021-03-11 LAB — GC/CHLAMYDIA PROBE AMP (~~LOC~~) NOT AT ARMC
Chlamydia: NEGATIVE
Comment: NEGATIVE
Comment: NORMAL
Neisseria Gonorrhea: NEGATIVE

## 2021-03-20 ENCOUNTER — Encounter: Payer: Medicaid Other | Admitting: Family Medicine

## 2021-06-07 ENCOUNTER — Encounter: Payer: Medicaid Other | Admitting: Family Medicine

## 2021-06-13 ENCOUNTER — Other Ambulatory Visit: Payer: Self-pay | Admitting: Obstetrics and Gynecology

## 2021-06-13 ENCOUNTER — Telehealth (INDEPENDENT_AMBULATORY_CARE_PROVIDER_SITE_OTHER): Payer: Medicaid Other

## 2021-06-13 ENCOUNTER — Telehealth: Payer: Self-pay | Admitting: Cardiovascular Disease

## 2021-06-13 DIAGNOSIS — O0990 Supervision of high risk pregnancy, unspecified, unspecified trimester: Secondary | ICD-10-CM | POA: Insufficient documentation

## 2021-06-13 DIAGNOSIS — D151 Benign neoplasm of heart: Secondary | ICD-10-CM

## 2021-06-13 DIAGNOSIS — O10912 Unspecified pre-existing hypertension complicating pregnancy, second trimester: Secondary | ICD-10-CM

## 2021-06-13 DIAGNOSIS — Z3A17 17 weeks gestation of pregnancy: Secondary | ICD-10-CM

## 2021-06-13 DIAGNOSIS — I10 Essential (primary) hypertension: Secondary | ICD-10-CM

## 2021-06-13 MED ORDER — TERCONAZOLE 0.4 % VA CREA: 1.0000 | TOPICAL_CREAM | Freq: Every day | VAGINAL | 0 refills | Status: DC

## 2021-06-13 MED ORDER — BLOOD PRESSURE KIT DEVI: 1.0000 | 0 refills | Status: DC | PRN

## 2021-06-13 MED ORDER — GNP DIGITAL WEIGHT SCALE MISC: 1.0000 | 0 refills | Status: DC | PRN

## 2021-06-13 MED ORDER — PROMETHAZINE HCL 25 MG PO TABS: 25.0000 mg | ORAL_TABLET | Freq: Four times a day (QID) | ORAL | 1 refills | Status: DC | PRN

## 2021-06-13 NOTE — Telephone Encounter (Signed)
Pt c/o Shortness Of Breath: STAT if SOB developed within the last 24 hours or pt is noticeably SOB on the phone  1. Are you currently SOB (can you hear that pt is SOB on the phone)? no  2. How long have you been experiencing SOB? Recent, believes about 2-3 weeks  3. Are you SOB when sitting or when up moving around? Did not say  4. Are you currently experiencing any other symptoms? No   [name] [name] from [facility] states the patient is pregnant and having SOB, due to the mass on her heart. She would like the call back to go to the patient.

## 2021-06-13 NOTE — Patient Instructions (Addendum)
[place] to pick up blood pressure cuff and weight scale:  [place] [place]  Radiology to check for cancellations- [phone]
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
## 2021-06-13 NOTE — Telephone Encounter (Signed)
No phone # listed for [name] [name] with [facility]   Attempted to call patient but unable to reach - left message to call back

## 2021-06-14 NOTE — Telephone Encounter (Signed)
Called placed to [facility], office closed Patient does have appointment with [place]ton Mam NP 8/4

## 2021-06-17 ENCOUNTER — Inpatient Hospital Stay (HOSPITAL_COMMUNITY)
Admission: AD | Admit: 2021-06-17 | Discharge: 2021-06-17 | Disposition: A | Discharge status: Home / Self Care | Payer: Medicaid Other | Attending: Family Medicine | Admitting: Family Medicine

## 2021-06-17 ENCOUNTER — Ambulatory Visit: Payer: Medicaid Other

## 2021-06-17 ENCOUNTER — Encounter (HOSPITAL_COMMUNITY): Payer: Self-pay | Admitting: Obstetrics & Gynecology

## 2021-06-17 ENCOUNTER — Inpatient Hospital Stay (HOSPITAL_BASED_OUTPATIENT_CLINIC_OR_DEPARTMENT_OTHER): Payer: Medicaid Other

## 2021-06-17 ENCOUNTER — Other Ambulatory Visit: Payer: Self-pay

## 2021-06-17 ENCOUNTER — Other Ambulatory Visit: Payer: Medicaid Other

## 2021-06-17 DIAGNOSIS — O368130 Decreased fetal movements, third trimester, not applicable or unspecified: Secondary | ICD-10-CM | POA: Diagnosis not present

## 2021-06-17 DIAGNOSIS — Z87440 Personal history of urinary (tract) infections: Secondary | ICD-10-CM | POA: Insufficient documentation

## 2021-06-17 DIAGNOSIS — Z3689 Encounter for other specified antenatal screening: Secondary | ICD-10-CM

## 2021-06-17 DIAGNOSIS — O10913 Unspecified pre-existing hypertension complicating pregnancy, third trimester: Secondary | ICD-10-CM

## 2021-06-17 DIAGNOSIS — O0990 Supervision of high risk pregnancy, unspecified, unspecified trimester: Secondary | ICD-10-CM

## 2021-06-17 DIAGNOSIS — O0933 Supervision of pregnancy with insufficient antenatal care, third trimester: Secondary | ICD-10-CM | POA: Diagnosis not present

## 2021-06-17 DIAGNOSIS — O09213 Supervision of pregnancy with history of pre-term labor, third trimester: Secondary | ICD-10-CM | POA: Diagnosis not present

## 2021-06-17 DIAGNOSIS — O10919 Unspecified pre-existing hypertension complicating pregnancy, unspecified trimester: Secondary | ICD-10-CM

## 2021-06-17 DIAGNOSIS — Z3A32 32 weeks gestation of pregnancy: Secondary | ICD-10-CM

## 2021-06-17 LAB — PROTEIN / CREATININE RATIO, URINE
Creatinine, Urine: 137.91 mg/dL
Protein Creatinine Ratio: 0.1 mg/mg{Cre} (ref 0.00–0.15)
Total Protein, Urine: 14 mg/dL

## 2021-06-17 LAB — COMPREHENSIVE METABOLIC PANEL
ALT: 15 U/L (ref 0–44)
AST: 15 U/L (ref 15–41)
Albumin: 2.6 g/dL — ABNORMAL LOW (ref 3.5–5.0)
Alkaline Phosphatase: 71 U/L (ref 38–126)
Anion gap: 11 (ref 5–15)
BUN: 6 mg/dL (ref 6–20)
CO2: 24 mmol/L (ref 22–32)
Calcium: 8.8 mg/dL — ABNORMAL LOW (ref 8.9–10.3)
Chloride: 101 mmol/L (ref 98–111)
Creatinine, Ser: 0.5 mg/dL (ref 0.44–1.00)
GFR, Estimated: >60 mL/min (ref >60)
Glucose, Bld: 87 mg/dL (ref 70–99)
Potassium: 3.3 mmol/L — ABNORMAL LOW (ref 3.5–5.1)
Sodium: 136 mmol/L (ref 135–145)
Total Bilirubin: 0.6 mg/dL (ref 0.3–1.2)
Total Protein: 6 g/dL — ABNORMAL LOW (ref 6.5–8.1)

## 2021-06-17 LAB — URINALYSIS, ROUTINE W REFLEX MICROSCOPIC
Bilirubin Urine: NEGATIVE
Glucose, UA: NEGATIVE mg/dL
Hgb urine dipstick: NEGATIVE
Ketones, ur: NEGATIVE mg/dL
Nitrite: NEGATIVE
Protein, ur: NEGATIVE mg/dL
Specific Gravity, Urine: 1.017 (ref 1.005–1.030)
pH: 6 (ref 5.0–8.0)

## 2021-06-17 LAB — CBC
HCT: 31.9 % — ABNORMAL LOW (ref 36.0–46.0)
Hemoglobin: 10.6 g/dL — ABNORMAL LOW (ref 12.0–15.0)
MCH: 31.7 pg (ref 26.0–34.0)
MCHC: 33.2 g/dL (ref 30.0–36.0)
MCV: 95.5 fL (ref 80.0–100.0)
Platelets: 311 10*3/uL (ref 150–400)
RBC: 3.34 MIL/uL — ABNORMAL LOW (ref 3.87–5.11)
RDW: 12.5 % (ref 11.5–15.5)
WBC: 10.3 10*3/uL (ref 4.0–10.5)
nRBC: 0 % (ref 0.0–0.2)

## 2021-06-17 MED ORDER — LABETALOL HCL 5 MG/ML IV SOLN: 80.0000 mg | INTRAVENOUS | Status: DC | PRN

## 2021-06-17 MED ORDER — LABETALOL HCL 5 MG/ML IV SOLN: 20.0000 mg | INTRAVENOUS | Status: DC | PRN

## 2021-06-17 MED ORDER — LABETALOL HCL 5 MG/ML IV SOLN
40.0000 mg | INTRAVENOUS | Status: DC | PRN
Start: 2021-06-17 — End: 2021-06-18

## 2021-06-17 MED ORDER — HYDRALAZINE HCL 20 MG/ML IJ SOLN: 10.0000 mg | INTRAMUSCULAR | Status: DC | PRN

## 2021-06-17 MED ORDER — NIFEDIPINE ER OSMOTIC RELEASE 30 MG PO TB24: 30.0000 mg | ORAL_TABLET | Freq: Every day | ORAL | 1 refills | Status: DC

## 2021-06-17 NOTE — MAU Provider Note (Signed)
Chief Complaint:  Decreased Fetal Movement    HPI: [name] [name] is a 35 y.o. [id number] at 40w1dwho presents to maternity admissions reporting decreased fetal movement. Pregnancy is significant for cHTN, hx c-section, and late to prenatal care. Patient reports for the past 37ms to hour she has felt decreased fetal movement. Since her arrival to MAU she has felt the baby move about 2-3 times. She denies contractions, vaginal bleeding, leaking fluid, headache, vision changes, or RUQ/epigastric pain. She is not currently on any medications.  Of further note, patient reports she has not had anything to eat or drink since 10am as she has been running errands all day with her other children. [facility]  Pregnancy Course:   Past Medical History:  Diagnosis Date   Abnormal Pap smear    f/u was normal   Chronic hypertension    Chronic hypertension    DEPRESSION, MAJOR, RECURRENT, MODERATE 04/18/2010   Qualifier: Diagnosis of  By: CoGeorgina SnellD, EvDarnelle Spangle   Herpes simplex virus (HSV) infection    History of abnormal Pap smear 07/28/2012   History of preterm delivery 11/04/2018   Infection    trich, chlamydia, gonorrhea   Ovarian cyst    PTSD (post-traumatic stress disorder)    Right atrial mass    Urinary tract infection    OB History  Gravida Para Term Preterm AB Living  8 7 6 1  0 8  SAB IAB Ectopic Multiple Live Births  0 0 0 1 8    # Outcome Date GA Lbr Len/2nd Weight Sex Delivery Anes PTL Lv  8 Current           7 Term 12/02/19 3954w2d:58 / 00:01 3505 g M VBAC EPI  LIV     Birth Comments: extra digit on each hand   6A Preterm 09/23/18 33w31w3d00 g F CS-LTranv EPI  LIV  6B Preterm 09/23/18 33w362w3d0 g F CS-LTranv EPI  LIV  5 Term 11/18/15 27w3d3w3d / 00:15 3425 g M Vag-Spont EPI  LIV  4 Term 07/13/13 [redacted]w[redacted]d 47w2d3501 g M VBAC EPI  LIV     Birth Comments: extra digits on hands bilaterally  3 Term 11/22/10 [redacted]w[redacted]d  [redacted]w[redacted]d F  VBAC  N LIV     Birth Comments: vbac  2 Term 09/23/09 [redacted]w[redacted]d  373w2dM VBAC  N LIV     Birth Comments: VBAC  1 Term 10/27/06 [redacted]w[redacted]d  3411w0d CS-LTranv  N LIV     Birth Comments: fetal decels   Past Surgical History:  Procedure Laterality Date   CESAREAN SECTION     CESAREAN SECTION N/A 09/23/2018   Procedure: CESAREAN SECTION;  Surgeon: Constant, Mora Bellmanation: [facility] [place]: Obstetrics;  Laterality: N/A;   CHOLECYSTECTOMY N/A 05/10/2020   Procedure: LAPAROSCOPIC CHOLECYSTECTOMY;  Surgeon: Ramirez, ARalene Okation: [facility] OR;  SeLester: General;  Laterality: N/A;   TEE WITHOUT CARDIOVERSION N/A 12/05/2019   Procedure: TRANSESOPHAGEAL ECHOCARDIOGRAM (TEE);  Surgeon: Schumann, Donato Heinzation: [facility];  Service: Endoscopy;  Laterality: N/A;   TONSILLECTOMY     WISDOM TOOTH EXTRACTION     Family History  Problem Relation Age of Onset   Hypertension Mother    Arthritis Mother    Hypertension Father    Asthma Brother    Cancer Maternal Uncle  lung   Diabetes Maternal Grandmother    Cancer Maternal Grandmother        thyroid   Hypertension Maternal Grandmother    Other Neg Hx    Social History   Tobacco Use   Smoking status: Never   Smokeless tobacco: Never  Vaping Use   Vaping Use: Never used  Substance Use Topics   Alcohol use: Not Currently    Alcohol/week: 2.0 standard drinks    Types: 1 Glasses of wine, 1 Shots of liquor per week    Comment: Not currently. Last use May 2020   Drug use: Not Currently    Types: Marijuana    Comment: Not currently. Last use 2017   Allergies  Allergen Reactions   Benadryl [Diphenhydramine] Itching    Pt reports being itchy after receiving benadryl through IV   Medications Prior to Admission  Medication Sig Dispense Refill Last Dose   acetaminophen (TYLENOL) 500 MG tablet Take 1-2 tablets (500-1,000 mg total) by mouth every 6 (six) hours as needed for mild pain. 100 tablet 2 Past Month    Prenatal Vit-Fe Fumarate-FA (PREPLUS) 27-1 MG TABS Take 1 tablet by mouth daily.   Past Month   promethazine (PHENERGAN) 25 MG tablet Take 1 tablet (25 mg total) by mouth every 6 (six) hours as needed for nausea or vomiting. 30 tablet 1 06/16/2021   terconazole (TERAZOL 7) 0.4 % vaginal cream Place 1 applicator vaginally at bedtime. 45 g 0 06/16/2021   Blood Pressure Monitoring (BLOOD PRESSURE KIT) DEVI 1 Device by Does not apply route as needed. 1 each 0    Misc. Devices (GNP DIGITAL WEIGHT SCALE) MISC 1 each by Does not apply route as needed. 1 each 0    I have reviewed patient's Past Medical Hx, Surgical Hx, Family Hx, Social Hx, medications and allergies.   ROS:  Review of Systems  Constitutional: Negative.   Respiratory: Negative.    Cardiovascular: Negative.   Gastrointestinal: Negative.   Genitourinary: Negative.   Musculoskeletal: Negative.   Neurological: Negative.   Psychiatric/Behavioral: Negative.     Physical Exam  Patient Vitals for the past 24 hrs:  BP Temp Temp src Pulse Resp SpO2 Height Weight  06/17/21 2016 -- -- -- -- -- 100 % -- --  06/17/21 2001 -- -- -- -- -- 100 % -- --  06/17/21 1956 -- -- -- -- -- 99 % -- --  06/17/21 1951 -- -- -- -- -- 100 % -- --  06/17/21 1946 -- -- -- -- -- 100 % -- --  06/17/21 1941 -- -- -- -- -- 100 % -- --  06/17/21 1936 -- -- -- -- -- 100 % -- --  06/17/21 1931 126/75 -- -- 92 -- 100 % -- --  06/17/21 1926 -- -- -- -- -- 100 % -- --  06/17/21 1921 -- -- -- -- -- 99 % -- --  06/17/21 1916 126/74 -- -- 90 -- 98 % -- --  06/17/21 1911 -- -- -- -- -- 99 % -- --  06/17/21 1906 -- -- -- -- -- 100 % -- --  06/17/21 1847 127/84 -- -- 88 -- -- -- --  06/17/21 1841 -- -- -- -- -- 96 % -- --  06/17/21 1836 -- -- -- -- -- 96 % -- --  06/17/21 1831 (!) 144/76 -- -- 95 -- 96 % -- --  06/17/21 1826 -- -- -- -- -- 95 % -- --  06/17/21 1821 -- -- -- -- --  95 % -- --  06/17/21 1816 136/76 -- -- 96 -- 96 % -- --  06/17/21 1810 -- -- -- -- -- 97  % -- --  06/17/21 1806 -- -- -- -- -- 98 % -- --  06/17/21 1801 124/69 -- -- 96 -- 98 % -- --  06/17/21 1756 -- -- -- -- -- 98 % -- --  06/17/21 1751 -- -- -- -- -- 97 % -- --  06/17/21 1746 132/81 -- -- 98 -- 98 % -- --  06/17/21 1741 (!) 141/79 -- -- (!) 102 -- 99 % -- --  06/17/21 1732 (!) 167/80 -- -- (!) 118 -- -- -- --  06/17/21 1731 -- -- -- -- -- 99 % -- --  06/17/21 1726 -- -- -- -- -- 99 % -- --  06/17/21 1721 -- -- -- -- -- 99 % -- --  06/17/21 1716 -- -- -- -- -- 100 % -- --  06/17/21 1711 -- -- -- -- -- 100 % -- --  06/17/21 1706 -- -- -- -- -- 99 % -- --  06/17/21 1702 (!) 159/87 -- -- (!) 113 -- -- -- --  06/17/21 1701 -- -- -- -- -- 99 % -- --  06/17/21 1656 -- -- -- -- -- 99 % -- --  06/17/21 1651 -- -- -- -- -- 99 % -- --  06/17/21 1646 (!) 146/81 -- -- (!) 119 -- 99 % -- --  06/17/21 1641 -- -- -- -- -- 99 % -- --  06/17/21 1636 -- -- -- -- -- 99 % -- --  06/17/21 1631 -- -- -- -- -- 99 % -- --  06/17/21 1630 (!) 150/85 -- -- 99 -- -- -- --  06/17/21 1626 -- -- -- -- -- 98 % -- --  06/17/21 1621 -- -- -- -- -- 98 % -- --  06/17/21 1616 -- -- -- -- -- 99 % -- --  06/17/21 1613 (!) 145/77 -- -- (!) 110 -- -- -- --  06/17/21 1611 -- -- -- -- -- 98 % -- --  06/17/21 1542 (!) 145/90 98.2 F (36.8 C) Oral (!) 106 20 99 % -- --  06/17/21 1539 -- -- -- -- -- -- 5' 2"  (1.575 m) 125.3 kg   Constitutional: well-developed, well-nourished female in no acute distress.  Cardiovascular: normal rate Respiratory: normal effort GI: abd soft, non-tender, gravid MS: extremities nontender, no edema, normal ROM Neurologic: alert and oriented x 4, no focal deficits, DTR's +1, clonus absent    FHT:  Baseline 135 bpm, moderate variability, 10x10 accelerations present, no decelerations Toco: quiet   Labs: Results for orders placed or performed during the hospital encounter of 06/17/21 (from the past 24 hour(s))  Urinalysis, Routine w reflex microscopic Urine, Clean Catch      Status: Abnormal   Collection Time: 06/17/21  3:47 PM  Result Value Ref Range   Color, Urine YELLOW YELLOW   APPearance HAZY (A) CLEAR   Specific Gravity, Urine 1.017 1.005 - 1.030   pH 6.0 5.0 - 8.0   Glucose, UA NEGATIVE NEGATIVE mg/dL   Hgb urine dipstick NEGATIVE NEGATIVE   Bilirubin Urine NEGATIVE NEGATIVE   Ketones, ur NEGATIVE NEGATIVE mg/dL   Protein, ur NEGATIVE NEGATIVE mg/dL   Nitrite NEGATIVE NEGATIVE   Leukocytes,Ua SMALL (A) NEGATIVE   RBC / HPF 0-5 0 - 5 RBC/hpf   WBC, UA 11-20 0 - 5 WBC/hpf   Bacteria, UA RARE (A) NONE SEEN  Squamous Epithelial / LPF 6-10 0 - 5  Protein / creatinine ratio, urine     Status: None   Collection Time: 06/17/21  4:17 PM  Result Value Ref Range   Creatinine, Urine 137.91 mg/dL   Total Protein, Urine 14 mg/dL   Protein Creatinine Ratio 0.10 0.00 - 0.15 mg/mg[Cre]  CBC     Status: Abnormal   Collection Time: 06/17/21  4:27 PM  Result Value Ref Range   WBC 10.3 4.0 - 10.5 K/uL   RBC 3.34 (L) 3.87 - 5.11 MIL/uL   Hemoglobin 10.6 (L) 12.0 - 15.0 g/dL   HCT 31.9 (L) 36.0 - 46.0 %   MCV 95.5 80.0 - 100.0 fL   MCH 31.7 26.0 - 34.0 pg   MCHC 33.2 30.0 - 36.0 g/dL   RDW 12.5 11.5 - 15.5 %   Platelets 311 150 - 400 K/uL   nRBC 0.0 0.0 - 0.2 %  Comprehensive metabolic panel     Status: Abnormal   Collection Time: 06/17/21  4:27 PM  Result Value Ref Range   Sodium 136 135 - 145 mmol/L   Potassium 3.3 (L) 3.5 - 5.1 mmol/L   Chloride 101 98 - 111 mmol/L   CO2 24 22 - 32 mmol/L   Glucose, Bld 87 70 - 99 mg/dL   BUN 6 6 - 20 mg/dL   Creatinine, Ser 0.50 0.44 - 1.00 mg/dL   Calcium 8.8 (L) 8.9 - 10.3 mg/dL   Total Protein 6.0 (L) 6.5 - 8.1 g/dL   Albumin 2.6 (L) 3.5 - 5.0 g/dL   AST 15 15 - 41 U/L   ALT 15 0 - 44 U/L   Alkaline Phosphatase 71 38 - 126 U/L   Total Bilirubin 0.6 0.3 - 1.2 mg/dL   GFR, Estimated >60 >60 mL/min   Anion gap 11 5 - 15    Imaging:  [place] MFM Fetal BPP Wo Non Stress  Result Date:  06/17/2021 ----------------------------------------------------------------------  OBSTETRICS REPORT                         (Signed Final 06/17/2021 08:56 pm) ---------------------------------------------------------------------- Patient Info  ID #:        [id number]                          D.O.B.:  Aug 16, 1986 (35 yrs)  Name:        [name] [name]                 Visit Date: 06/17/2021 07:05 pm ---------------------------------------------------------------------- Performed By  Attending:         Tama High MD        Referred By:       [facility] MAU/Triage  Performed By:      Hubert Azure RDMS     [facility]                                                               [place] ---------------------------------------------------------------------- Orders  #   Description  Code         Ordered By  1   [place] MFM FETAL BPP WO NON              [id number]     Aniko Finnigan      STRESS ----------------------------------------------------------------------  #   Order #                    Accession #                 Episode #  1   [id number]                  [id number]                  [id number] ---------------------------------------------------------------------- Indications  [redacted] weeks gestation of pregnancy                 Z3A.32  Decreased fetal movement                        O36.8190  Insufficient Prenatal Care                      O09.30 ---------------------------------------------------------------------- Fetal Evaluation  Num Of Fetuses:          1  Fetal Heart Rate(bpm):   140  Cardiac Activity:        Observed  Presentation:            Cephalic  Placenta:                Posterior  P. Cord Insertion:       Visualized  Amniotic Fluid  AFI FV:      Within normal limits  AFI Sum(cm)     %Tile       Largest Pocket(cm)  15              53          4.8  RUQ(cm)       RLQ(cm)        LUQ(cm)        LLQ(cm)  4.4           3.9            1.9            4.8  Comment:      Stomach, bladder, diaphragm noted. ---------------------------------------------------------------------- Biophysical Evaluation  Amniotic F.V:   Within normal limits        F. Tone:         Observed  F. Movement:    Observed                    Score:           8/8  F. Breathing:   Observed ---------------------------------------------------------------------- Gestational Age  Best:           32w 1d    Det. By:  U/S  (03/09/21)            EDD:  08/11/21 ---------------------------------------------------------------------- Impression  Patient was evaluated for c/o decreased fetal movements .  Amniotic fluid is normal and good fetal activity is seen .Antenatal  testing is reassuring. BPP 8/8. ----------------------------------------------------------------------                  Tama High, MD Electronically Signed Final Report   06/17/2021 08:56 pm ----------------------------------------------------------------------   MAU Course: Orders Placed  This Encounter  Procedures   [place] MFM Fetal BPP Wo Non Stress   Urinalysis, Routine w reflex microscopic Urine, Clean Catch   CBC   Comprehensive metabolic panel   Protein / creatinine ratio, urine   Vital signs   Notify Physician   Measure blood pressure   Discharge patient   Seizure precautions   Meds ordered this encounter  Medications   AND Linked Order Group    labetalol (NORMODYNE) injection 20 mg    labetalol (NORMODYNE) injection 40 mg    labetalol (NORMODYNE) injection 80 mg    hydrALAZINE (APRESOLINE) injection 10 mg   NIFEdipine (PROCARDIA XL) 30 MG 24 hr tablet    Sig: Take 1 tablet (30 mg total) by mouth daily.    Dispense:  30 tablet    Refill:  1    Order Specific Question:   Supervising Provider    Answer:   Merrily Pew    MDM: Serial BP's, normotensive to mild range. Patient asymptomatic  CBC, CMP, UPCR wnl NST overall reassuring for gestational age BPP 48/8 with normal AFI Patient given NST "clicker" with  multiple clicks. Patient reports feeling a lot of fetal movement now PO hydration Labetalol protocol ordered prn Discussed patient with Dr. Bess Kinds, will start patient on Procardia XL 10m daily. Reviewed signs of preeclampsia in detail with patient. Pt verbalizes understanding.  Will send message to clinic to move NOB appt up from 8/1 if possible. If not, pt will need a BPP next week. Message sent to clinic staff.    Assessment: 1. Supervision of high risk pregnancy, antepartum   2. Chronic hypertension affecting pregnancy   3. Decreased fetal movements in third trimester, single or unspecified fetus   4. NST (non-stress test) reactive     Plan: Discharge home in stable condition Reviewed warning signs of pre-eclampsia  Preterm labor precautions and fetal kick counts reviewed Clinic to call and schedule BPP    [facility] [place] [facility].   Specialty: Obstetrics and Gynecology Why: as scheduled. Return to MAU as needed. Contact information: [address] [place] [phone]               Allergies as of 06/17/2021       Reactions   Benadryl [diphenhydramine] Itching   Pt reports being itchy after receiving benadryl through IV        Medication List     TAKE these medications    acetaminophen 500 MG tablet Commonly known as: TYLENOL Take 1-2 tablets (500-1,000 mg total) by mouth every 6 (six) hours as needed for mild pain.   Blood Pressure Kit Devi 1 Device by Does not apply route as needed.   GNP Digital Weight Scale Misc 1 each by Does not apply route as needed.   NIFEdipine 30 MG 24 hr tablet Commonly known as: Procardia XL Take 1 tablet (30 mg total) by mouth daily.   PrePLUS 27-1 MG Tabs Take 1 tablet by mouth daily.   promethazine 25 MG tablet Commonly known as: PHENERGAN Take 1 tablet (25 mg total) by mouth every 6 (six) hours as  needed for nausea or vomiting.   terconazole 0.4 % vaginal cream Commonly known as: TERAZOL 7 Place 1 applicator vaginally at bedtime.          DMaryagnes Amos MSN, CNM 06/17/2021 9:23 PM

## 2021-06-17 NOTE — MAU Note (Signed)
Presents with c/o decreased FM, reports fetus moving less than usual.  Denies VB or LOF.

## 2021-06-19 NOTE — Telephone Encounter (Signed)
Left message for pt to call.

## 2021-06-26 ENCOUNTER — Encounter: Payer: Medicaid Other | Admitting: Family Medicine

## 2021-07-01 ENCOUNTER — Encounter: Payer: Self-pay | Admitting: Obstetrics and Gynecology

## 2021-07-01 ENCOUNTER — Telehealth: Payer: Self-pay | Admitting: Family Medicine

## 2021-07-01 ENCOUNTER — Encounter: Payer: Medicaid Other | Admitting: Obstetrics and Gynecology

## 2021-07-01 NOTE — Telephone Encounter (Signed)
   Morrigan Asti DOB: September 27, 1986 MRN: [id number]   RIDER WAIVER AND RELEASE OF LIABILITY  For purposes of improving physical access to our facilities, [facility] is pleased to partner with third parties to provide [place] patients or other authorized individuals the option of convenient, on-demand ground transportation services (the [place]") through use of the technology service that enables users to request on-demand ground transportation from independent third-party providers.  By opting to use and accept these [employer], I, the undersigned, hereby agree on behalf of myself, and on behalf of any minor child using the Government social research officer for whom I am the parent or legal guardian, as follows:  Government social research officer provided to me are provided by independent third-party transportation providers who are not [employer] or employees and who are unaffiliated with [employer]. [employer] is neither a transportation carrier nor a common or public carrier. [facility] has no control over the quality or safety of the transportation that occurs as a result of the [employer]. [facility] cannot guarantee that any third-party transportation provider will complete any arranged transportation service. [facility] makes no representation, warranty, or guarantee regarding the reliability, timeliness, quality, safety, suitability, or availability of any of the Transport Services or that they will be error free. I fully understand that traveling by vehicle involves risks and dangers of serious bodily injury, including permanent disability, paralysis, and death. I agree, on behalf of myself and on behalf of any minor child using the Transport Services for whom I am the parent or legal guardian, that the entire risk arising out of my use of the [employer] remains solely with me, to the maximum extent permitted under applicable law. The [employer] are provided "as  is" and "as available." [facility] disclaims all representations and warranties, express, implied or statutory, not expressly set out in these terms, including the implied warranties of merchantability and fitness for a particular purpose. I hereby waive and release [place], its agents, employees, officers, directors, representatives, insurers, attorneys, assigns, successors, subsidiaries, and affiliates from any and all past, present, or future claims, demands, liabilities, actions, causes of action, or suits of any kind directly or indirectly arising from acceptance and use of the [employer]. I further waive and release [facility] and its affiliates from all present and future liability and responsibility for any injury or death to persons or damages to property caused by or related to the use of the [employer]. I have read this Waiver and Release of Liability, and I understand the terms used in it and their legal significance. This Waiver is freely and voluntarily given with the understanding that my right (as well as the right of any minor child for whom I am the parent or legal guardian using the [employer]) to legal recourse against [place] in connection with the [employer] is knowingly surrendered in return for use of these services.   I attest that I read the consent document to [name], gave Ms. Halt the opportunity to ask questions and answered the questions asked (if any). I affirm that [name] then provided consent for she's participation in this program.     Katy Apo

## 2021-07-02 ENCOUNTER — Other Ambulatory Visit: Payer: Self-pay

## 2021-07-02 ENCOUNTER — Ambulatory Visit (HOSPITAL_BASED_OUTPATIENT_CLINIC_OR_DEPARTMENT_OTHER): Payer: Medicaid Other | Admitting: Maternal & Fetal Medicine

## 2021-07-02 ENCOUNTER — Encounter: Payer: Self-pay | Admitting: *Deleted

## 2021-07-02 ENCOUNTER — Ambulatory Visit (HOSPITAL_BASED_OUTPATIENT_CLINIC_OR_DEPARTMENT_OTHER): Payer: Medicaid Other

## 2021-07-02 ENCOUNTER — Ambulatory Visit: Payer: Medicaid Other | Attending: Obstetrics and Gynecology | Admitting: *Deleted

## 2021-07-02 ENCOUNTER — Other Ambulatory Visit: Payer: Self-pay | Admitting: *Deleted

## 2021-07-02 ENCOUNTER — Other Ambulatory Visit: Payer: Self-pay | Admitting: Obstetrics and Gynecology

## 2021-07-02 VITALS — BP 130/86 | HR 112

## 2021-07-02 DIAGNOSIS — O10913 Unspecified pre-existing hypertension complicating pregnancy, third trimester: Secondary | ICD-10-CM | POA: Diagnosis not present

## 2021-07-02 DIAGNOSIS — O10919 Unspecified pre-existing hypertension complicating pregnancy, unspecified trimester: Secondary | ICD-10-CM | POA: Diagnosis not present

## 2021-07-02 DIAGNOSIS — Z363 Encounter for antenatal screening for malformations: Secondary | ICD-10-CM | POA: Diagnosis not present

## 2021-07-02 DIAGNOSIS — I10 Essential (primary) hypertension: Secondary | ICD-10-CM | POA: Diagnosis not present

## 2021-07-02 DIAGNOSIS — O0990 Supervision of high risk pregnancy, unspecified, unspecified trimester: Secondary | ICD-10-CM | POA: Diagnosis present

## 2021-07-02 DIAGNOSIS — O99213 Obesity complicating pregnancy, third trimester: Secondary | ICD-10-CM | POA: Diagnosis not present

## 2021-07-02 DIAGNOSIS — E669 Obesity, unspecified: Secondary | ICD-10-CM | POA: Diagnosis not present

## 2021-07-02 DIAGNOSIS — Z3A00 Weeks of gestation of pregnancy not specified: Secondary | ICD-10-CM | POA: Diagnosis not present

## 2021-07-02 DIAGNOSIS — O34219 Maternal care for unspecified type scar from previous cesarean delivery: Secondary | ICD-10-CM | POA: Insufficient documentation

## 2021-07-02 NOTE — Progress Notes (Signed)
MFM Brief Note  [name] is a 35 yo G8P8 who is here with a single intrauterine pregnancy here for a detailed anatomy due to chronic hypertension on nifedipine.  Normal anatomy with measurements consistent with dates There is good fetal movement and amniotic fluid volume Suboptimal views of the fetal anatomy were obtained secondary to fetal position and advanced gestational age.  I reviewed today's study and recommend serial growth, and initiation of weekly testing at 32 weeks. We discussed the increased risk for fetal growth restriction, placental abruption, preeclampsia, stillbirth and neonatal ICU admission.   She is not taking low dose ASA.  Blood pressure today was 130/86 mmHg.  We briefly discussed weight gain and exercise in pregnancy.  All questions answered  I spent 20 minutes with > 50% in face to face consultation.  Vikki Ports, MD

## 2021-07-03 NOTE — Telephone Encounter (Signed)
Pt didn't return call but has an appointment scheduled for tomorrow 8/3 with Laurann Montana, RN. Will close encounter and address at office visit.

## 2021-07-03 NOTE — Progress Notes (Deleted)
Office Visit    Patient Name: [name] [name] Date of Encounter: 07/03/2021  PCP:  Ezequiel Essex, [place]  Cardiologist:  Skeet Latch, MD  Advanced Practice Provider:  No care team member to display Electrophysiologist:  None   Chief Complaint    [name] [name] is a 35 y.o. female with a hx of hypertension, atrial myxoma, PTSD, ADHD, cognitive impairment, obesity, depression presents today for shortness of breath   Past Medical History    Past Medical History:  Diagnosis Date   Abnormal Pap smear    f/u was normal   Chronic hypertension    Chronic hypertension    DEPRESSION, MAJOR, RECURRENT, MODERATE 04/18/2010   Qualifier: Diagnosis of  By: Georgina Snell MD, Darnelle Spangle)    Herpes simplex virus (HSV) infection    History of abnormal Pap smear 07/28/2012   History of preterm delivery 11/04/2018   Infection    trich, chlamydia, gonorrhea   Ovarian cyst    PTSD (post-traumatic stress disorder)    Right atrial mass    Urinary tract infection    Past Surgical History:  Procedure Laterality Date   CESAREAN SECTION     CESAREAN SECTION N/A 09/23/2018   Procedure: CESAREAN SECTION;  Surgeon: Mora Bellman, MD;  Location: [facility];  Service: Obstetrics;  Laterality: N/A;   CHOLECYSTECTOMY N/A 05/10/2020   Procedure: LAPAROSCOPIC CHOLECYSTECTOMY;  Surgeon: Ralene Ok, MD;  Location: [place] [place] [facility];  Service: General;  Laterality: N/A;   TEE WITHOUT CARDIOVERSION N/A 12/05/2019   Procedure: TRANSESOPHAGEAL ECHOCARDIOGRAM (TEE);  Surgeon: Donato Heinz, MD;  Location: [facility] ENDOSCOPY;  Service: Endoscopy;  Laterality: N/A;   TONSILLECTOMY     WISDOM TOOTH EXTRACTION      Allergies  Allergies  Allergen Reactions   Benadryl [Diphenhydramine] Itching    Pt reports being itchy after receiving benadryl through IV    History of Present Illness    [name] [name] is a 35 y.o. female with a hx of hypertension, atrial myxoma,  PTSD, ADHD, cognitive impairment, obesity, depression last seen 01/18/21 by Coletta Memos, NP.  She was seen by Dr. Oval Linsey during admission 12/2019. Echo 12/01/19 with LVEF 55-60%, mild LVH, gr1DD, moderately sized right atrial mass. TEE showed 1.6x1.1 cm mobile round echogenic mass at junction of ICA and right atrium. She was recommended for further evaluation by MRI.   She had cardiac MRI 12/2020 with LVEF 59%, 8x7 mm highly mobile mass attached by stalk at junction of IVC and right atrium. The mass was consistent with myxoma.   She was seen in follow up 01/18/21 doing well from cardiac perspective with no recurrent chest pain. She was referred to cardiothoracic surgery but had not yet scheduled appointment.   She was seen in MAU due to decreased fetal movement. This improved on presentation to MAU. She was started on Procardia XL '30mg'$  QD. She was seen by [facility] 07/01/21 - she is G8P8. She was recommended to follow up with our office due to shortness of breath.  She presents today for shortness of breath. ***  EKGs/Labs/Other Studies Reviewed:   The following studies were reviewed today: Cardiac MRI 12/2020 IMPRESSION: 1.  Normal LV size and systolic function, EF [id number].   2.  Normal RV size and systolic function, EF [id number].   3. No myocardial LGE, so no definitive evidence for prior MI, infiltrative disease, or myocarditis.   4. 8 x 7 mm highly mobile mass  attached by a stalk at the junction of the IVC and right atrium, seen best on short axis cine images. Temporal resolution of MRI is not ideal for evaluation of this small, mobile mass (suspect it would be better-visualized by TEE). The mass is dark (non-enhanced) on delayed enhancement imaging. The mass is consistent with a myxoma, suspect not a thrombus given regularity and stalk. Echo TEE 12/05/19  1. There is a 1.6cm x 1.1cm mobile, round, echogenic mass located at the  junction of the IVC and right atrium. Given rounded  mass with narrow stalk  connecting it to wall of RA/IVC, suspect myxoma. While IVC/RA junction  would be unusual location for a  myxoma, there have been case reports of myxomas at this location.  Differential includes thrombus. Could consider cardiac MRI for further  evaluation.   2. Left ventricular ejection fraction, by visual estimation, is 55 to  60%. The left ventricle has normal function.   3. Global right ventricle has normal systolic function.The right  ventricular size is normal.   4. The mitral valve is normal in structure. No evidence of mitral valve  regurgitation.   5. The tricuspid valve is normal in structure.   6. The aortic valve is tricuspid. Aortic valve regurgitation is not  visualized.   7. The pulmonic valve was grossly normal. Pulmonic valve regurgitation is  not visualized.   EKG:  EKG is *** ordered today.  The ekg ordered today demonstrates ***  Recent Labs: 06/17/2021: ALT 15; BUN 6; Creatinine, Ser 0.50; Hemoglobin 10.6; Platelets 311; Potassium 3.3; Sodium 136  Recent Lipid Panel No results found for: CHOL, TRIG, HDL, CHOLHDL, VLDL, LDLCALC, LDLDIRECT  Home Medications   No outpatient medications have been marked as taking for the 07/04/21 encounter (Appointment) with Loel Dubonnet, NP.     Review of Systems      All other systems reviewed and are otherwise negative except as noted above.  Physical Exam    VS:  There were no vitals taken for this visit. , BMI There is no height or weight on file to calculate BMI.  Wt Readings from Last 3 Encounters:  06/17/21 276 lb 4.8 oz (125.3 kg)  03/09/21 265 lb 8 oz (120.4 kg)  01/18/21 261 lb 3.2 oz (118.5 kg)     GEN: Well nourished, well developed, in no acute distress. HEENT: normal. Neck: Supple, no JVD, carotid bruits, or masses. Cardiac: ***RRR, no murmurs, rubs, or gallops. No clubbing, cyanosis, edema.  ***Radials/PT 2+ and equal bilaterally.  Respiratory:  ***Respirations regular and  unlabored, clear to auscultation bilaterally. GI: Soft, nontender, nondistended. MS: No deformity or atrophy. Skin: Warm and dry, no rash. Neuro:  Strength and sensation are intact. Psych: Normal affect.  Assessment & Plan    SOB -   Atrial myxoma -  Atypical chest pain -   HTN -   High risk pregnancy -   Disposition: Follow up {follow up:15908} with Dr. Oval Linsey or APP.  Signed, Loel Dubonnet, NP 07/03/2021, 12:44 PM [place]

## 2021-07-04 ENCOUNTER — Ambulatory Visit (HOSPITAL_BASED_OUTPATIENT_CLINIC_OR_DEPARTMENT_OTHER): Payer: Medicaid Other | Admitting: Family

## 2021-07-05 NOTE — Progress Notes (Signed)
Patient did not keep her new OB appointment for 07/01/2021.  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

## 2021-07-09 ENCOUNTER — Other Ambulatory Visit: Payer: Self-pay

## 2021-07-09 ENCOUNTER — Ambulatory Visit (INDEPENDENT_AMBULATORY_CARE_PROVIDER_SITE_OTHER): Payer: Medicaid Other | Admitting: General Practice

## 2021-07-09 ENCOUNTER — Ambulatory Visit (INDEPENDENT_AMBULATORY_CARE_PROVIDER_SITE_OTHER): Payer: Medicaid Other

## 2021-07-09 VITALS — BP 147/92 | HR 100

## 2021-07-09 DIAGNOSIS — O10919 Unspecified pre-existing hypertension complicating pregnancy, unspecified trimester: Secondary | ICD-10-CM

## 2021-07-09 DIAGNOSIS — O0990 Supervision of high risk pregnancy, unspecified, unspecified trimester: Secondary | ICD-10-CM

## 2021-07-09 NOTE — Progress Notes (Signed)
Pt informed that the ultrasound is considered a limited OB ultrasound and is not intended to be a complete ultrasound exam.  Patient also informed that the ultrasound is not being completed with the intent of assessing for fetal or placental anomalies or any pelvic abnormalities.  Explained that the purpose of today's ultrasound is to assess for  BPP, presentation, and AFI.  Patient acknowledges the purpose of the exam and the limitations of the study.     Koren Bound RN BSN 07/09/21

## 2021-07-12 ENCOUNTER — Ambulatory Visit: Payer: Medicaid Other

## 2021-07-15 ENCOUNTER — Ambulatory Visit (INDEPENDENT_AMBULATORY_CARE_PROVIDER_SITE_OTHER): Payer: Medicaid Other

## 2021-07-15 ENCOUNTER — Other Ambulatory Visit: Payer: Self-pay

## 2021-07-15 ENCOUNTER — Ambulatory Visit (INDEPENDENT_AMBULATORY_CARE_PROVIDER_SITE_OTHER): Payer: Medicaid Other | Admitting: Obstetrics & Gynecology

## 2021-07-15 ENCOUNTER — Ambulatory Visit: Payer: Medicaid Other | Admitting: *Deleted

## 2021-07-15 ENCOUNTER — Other Ambulatory Visit (HOSPITAL_COMMUNITY)
Admission: RE | Admit: 2021-07-15 | Discharge: 2021-07-15 | Disposition: A | Discharge status: Home / Self Care | Payer: Medicaid Other | Source: Ambulatory Visit | Attending: Obstetrics & Gynecology | Admitting: Obstetrics & Gynecology

## 2021-07-15 VITALS — BP 150/91 | HR 107 | Wt 278.7 lb

## 2021-07-15 DIAGNOSIS — O10919 Unspecified pre-existing hypertension complicating pregnancy, unspecified trimester: Secondary | ICD-10-CM

## 2021-07-15 DIAGNOSIS — O0990 Supervision of high risk pregnancy, unspecified, unspecified trimester: Secondary | ICD-10-CM

## 2021-07-15 DIAGNOSIS — Z5941 Food insecurity: Secondary | ICD-10-CM

## 2021-07-15 DIAGNOSIS — Z1331 Encounter for screening for depression: Secondary | ICD-10-CM

## 2021-07-15 DIAGNOSIS — Z6841 Body Mass Index (BMI) 40.0 and over, adult: Secondary | ICD-10-CM

## 2021-07-15 DIAGNOSIS — D151 Benign neoplasm of heart: Secondary | ICD-10-CM | POA: Diagnosis not present

## 2021-07-15 DIAGNOSIS — I10 Essential (primary) hypertension: Secondary | ICD-10-CM

## 2021-07-15 DIAGNOSIS — Z3A36 36 weeks gestation of pregnancy: Secondary | ICD-10-CM

## 2021-07-15 MED ORDER — NIFEDIPINE ER 60 MG PO TB24: 60.0000 mg | ORAL_TABLET | Freq: Every day | ORAL | 1 refills | Status: DC

## 2021-07-15 NOTE — Progress Notes (Signed)
Pt has not taken Nifedipine yet today. She denies H/A or visual disturbances. Pt also reports difficulty sleeping - requests Rx. Pt has increased stress - needs [facility] appt. Pt states she is living in a hotel and desires permanent living arrangements. Lattie Haw - prenatal navigator agreed to speak with pt today. Food insecurity identified - needs trip to [employer]
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
## 2021-07-16 ENCOUNTER — Telehealth: Payer: Self-pay | Admitting: Clinical

## 2021-07-16 LAB — CBC
Hematocrit: 31.4 % — ABNORMAL LOW (ref 34.0–46.6)
Hemoglobin: 10.3 g/dL — ABNORMAL LOW (ref 11.1–15.9)
MCH: 30.7 pg (ref 26.6–33.0)
MCHC: 32.8 g/dL (ref 31.5–35.7)
MCV: 94 fL (ref 79–97)
Platelets: 312 10*3/uL (ref 150–450)
RBC: 3.35 x10E6/uL — ABNORMAL LOW (ref 3.77–5.28)
RDW: 13 % (ref 11.7–15.4)
WBC: 10.3 10*3/uL (ref 3.4–10.8)

## 2021-07-16 LAB — COMPREHENSIVE METABOLIC PANEL
ALT: 13 IU/L (ref 0–32)
AST: 10 IU/L (ref 0–40)
Albumin/Globulin Ratio: 1.3 (ref 1.2–2.2)
Albumin: 3.6 g/dL — ABNORMAL LOW (ref 3.8–4.8)
Alkaline Phosphatase: 102 IU/L (ref 44–121)
BUN/Creatinine Ratio: 17 (ref 9–23)
BUN: 6 mg/dL (ref 6–20)
Bilirubin Total: <0.2 mg/dL (ref 0.0–1.2)
CO2: 21 mmol/L (ref 20–29)
Calcium: 9.1 mg/dL (ref 8.7–10.2)
Chloride: 103 mmol/L (ref 96–106)
Creatinine, Ser: 0.36 mg/dL — ABNORMAL LOW (ref 0.57–1.00)
Globulin, Total: 2.7 g/dL (ref 1.5–4.5)
Glucose: 105 mg/dL — ABNORMAL HIGH (ref 65–99)
Potassium: 3.8 mmol/L (ref 3.5–5.2)
Sodium: 141 mmol/L (ref 134–144)
Total Protein: 6.3 g/dL (ref 6.0–8.5)
eGFR: 137 mL/min/{1.73_m2} (ref >59)

## 2021-07-16 LAB — PROTEIN / CREATININE RATIO, URINE
Creatinine, Urine: 83.8 mg/dL
Protein, Ur: 16.5 mg/dL
Protein/Creat Ratio: 197 mg/g creat (ref 0–200)

## 2021-07-16 LAB — GC/CHLAMYDIA PROBE AMP (~~LOC~~) NOT AT ARMC
Chlamydia: NEGATIVE
Comment: NEGATIVE
Comment: NORMAL
Neisseria Gonorrhea: NEGATIVE

## 2021-07-16 NOTE — Telephone Encounter (Signed)
Attempt to contact pt to schedule appointment with [facility], per referral; Left HIPPA-compliant message to call back Roselyn Reef from Center for [employer] at [facility] at  [phone] [facility] office), and left MyChart message for pt.

## 2021-07-17 LAB — STREP GP B NAA: Strep Gp B NAA: NEGATIVE

## 2021-07-18 LAB — HEMOGLOBIN A1C
Est. average glucose Bld gHb Est-mCnc: 100 mg/dL
Hgb A1c MFr Bld: 5.1 % (ref 4.8–5.6)

## 2021-07-18 LAB — HEPATITIS B SURFACE ANTIGEN: Hepatitis B Surface Ag: NEGATIVE

## 2021-07-18 LAB — HEPATITIS C ANTIBODY: Hep C Virus Ab: <0.1 s/co ratio (ref 0.0–0.9)

## 2021-07-18 LAB — SPECIMEN STATUS REPORT

## 2021-07-22 ENCOUNTER — Other Ambulatory Visit: Payer: Medicaid Other

## 2021-07-22 ENCOUNTER — Encounter: Payer: Medicaid Other | Admitting: Obstetrics & Gynecology

## 2021-07-31 ENCOUNTER — Encounter: Payer: Self-pay | Admitting: *Deleted

## 2021-07-31 ENCOUNTER — Ambulatory Visit (INDEPENDENT_AMBULATORY_CARE_PROVIDER_SITE_OTHER): Payer: Medicaid Other | Admitting: Obstetrics and Gynecology

## 2021-07-31 ENCOUNTER — Ambulatory Visit: Payer: Medicaid Other | Admitting: *Deleted

## 2021-07-31 ENCOUNTER — Ambulatory Visit (HOSPITAL_BASED_OUTPATIENT_CLINIC_OR_DEPARTMENT_OTHER): Payer: Medicaid Other

## 2021-07-31 ENCOUNTER — Other Ambulatory Visit: Payer: Self-pay

## 2021-07-31 ENCOUNTER — Encounter: Payer: Self-pay | Admitting: Obstetrics and Gynecology

## 2021-07-31 VITALS — BP 159/107 | HR 134

## 2021-07-31 VITALS — BP 134/98 | HR 113

## 2021-07-31 DIAGNOSIS — O0990 Supervision of high risk pregnancy, unspecified, unspecified trimester: Secondary | ICD-10-CM

## 2021-07-31 DIAGNOSIS — O10913 Unspecified pre-existing hypertension complicating pregnancy, third trimester: Secondary | ICD-10-CM | POA: Insufficient documentation

## 2021-07-31 DIAGNOSIS — O34219 Maternal care for unspecified type scar from previous cesarean delivery: Secondary | ICD-10-CM

## 2021-07-31 DIAGNOSIS — I10 Essential (primary) hypertension: Secondary | ICD-10-CM

## 2021-07-31 DIAGNOSIS — Z98891 History of uterine scar from previous surgery: Secondary | ICD-10-CM

## 2021-07-31 DIAGNOSIS — Z3A38 38 weeks gestation of pregnancy: Secondary | ICD-10-CM | POA: Diagnosis not present

## 2021-07-31 DIAGNOSIS — F70 Mild intellectual disabilities: Secondary | ICD-10-CM

## 2021-07-31 DIAGNOSIS — D151 Benign neoplasm of heart: Secondary | ICD-10-CM | POA: Diagnosis not present

## 2021-07-31 DIAGNOSIS — Z8659 Personal history of other mental and behavioral disorders: Secondary | ICD-10-CM

## 2021-07-31 DIAGNOSIS — O0933 Supervision of pregnancy with insufficient antenatal care, third trimester: Secondary | ICD-10-CM

## 2021-07-31 DIAGNOSIS — Z8619 Personal history of other infectious and parasitic diseases: Secondary | ICD-10-CM

## 2021-07-31 DIAGNOSIS — O10013 Pre-existing essential hypertension complicating pregnancy, third trimester: Secondary | ICD-10-CM | POA: Diagnosis not present

## 2021-07-31 DIAGNOSIS — Z87898 Personal history of other specified conditions: Secondary | ICD-10-CM

## 2021-07-31 NOTE — BH Specialist Note (Signed)
Pt in hospital       
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
## 2021-08-01 ENCOUNTER — Other Ambulatory Visit: Payer: Self-pay

## 2021-08-01 ENCOUNTER — Encounter (HOSPITAL_COMMUNITY): Payer: Self-pay | Admitting: Obstetrics & Gynecology

## 2021-08-01 ENCOUNTER — Inpatient Hospital Stay (HOSPITAL_COMMUNITY)
Admission: AD | Admit: 2021-08-01 | Discharge: 2021-08-06 | DRG: 805 | Disposition: A | Discharge status: Home / Self Care | Payer: Medicaid Other | Attending: Obstetrics and Gynecology | Admitting: Obstetrics and Gynecology

## 2021-08-01 DIAGNOSIS — O43123 Velamentous insertion of umbilical cord, third trimester: Secondary | ICD-10-CM | POA: Diagnosis present

## 2021-08-01 DIAGNOSIS — Z8616 Personal history of COVID-19: Secondary | ICD-10-CM | POA: Diagnosis not present

## 2021-08-01 DIAGNOSIS — Z3A38 38 weeks gestation of pregnancy: Secondary | ICD-10-CM

## 2021-08-01 DIAGNOSIS — I10 Essential (primary) hypertension: Secondary | ICD-10-CM | POA: Diagnosis present

## 2021-08-01 DIAGNOSIS — Z20822 Contact with and (suspected) exposure to covid-19: Secondary | ICD-10-CM | POA: Diagnosis present

## 2021-08-01 DIAGNOSIS — O1002 Pre-existing essential hypertension complicating childbirth: Principal | ICD-10-CM | POA: Diagnosis present

## 2021-08-01 DIAGNOSIS — O34219 Maternal care for unspecified type scar from previous cesarean delivery: Secondary | ICD-10-CM | POA: Diagnosis present

## 2021-08-01 DIAGNOSIS — D151 Benign neoplasm of heart: Secondary | ICD-10-CM | POA: Diagnosis present

## 2021-08-01 DIAGNOSIS — Z3A39 39 weeks gestation of pregnancy: Secondary | ICD-10-CM | POA: Diagnosis not present

## 2021-08-01 DIAGNOSIS — O34211 Maternal care for low transverse scar from previous cesarean delivery: Secondary | ICD-10-CM | POA: Diagnosis not present

## 2021-08-01 DIAGNOSIS — O0990 Supervision of high risk pregnancy, unspecified, unspecified trimester: Secondary | ICD-10-CM

## 2021-08-01 DIAGNOSIS — O134 Gestational [pregnancy-induced] hypertension without significant proteinuria, complicating childbirth: Secondary | ICD-10-CM | POA: Diagnosis not present

## 2021-08-01 DIAGNOSIS — O9942 Diseases of the circulatory system complicating childbirth: Secondary | ICD-10-CM | POA: Diagnosis present

## 2021-08-01 LAB — URINALYSIS, ROUTINE W REFLEX MICROSCOPIC
Bilirubin Urine: NEGATIVE
Glucose, UA: NEGATIVE mg/dL
Hgb urine dipstick: NEGATIVE
Ketones, ur: 5 mg/dL — AB
Nitrite: NEGATIVE
Protein, ur: 30 mg/dL — AB
Specific Gravity, Urine: 1.018 (ref 1.005–1.030)
pH: 6 (ref 5.0–8.0)

## 2021-08-01 LAB — CBC
HCT: 31.7 % — ABNORMAL LOW (ref 36.0–46.0)
Hemoglobin: 10.3 g/dL — ABNORMAL LOW (ref 12.0–15.0)
MCH: 30.7 pg (ref 26.0–34.0)
MCHC: 32.5 g/dL (ref 30.0–36.0)
MCV: 94.3 fL (ref 80.0–100.0)
Platelets: 330 10*3/uL (ref 150–400)
RBC: 3.36 MIL/uL — ABNORMAL LOW (ref 3.87–5.11)
RDW: 13.5 % (ref 11.5–15.5)
WBC: 9.9 10*3/uL (ref 4.0–10.5)
nRBC: 0 % (ref 0.0–0.2)

## 2021-08-01 LAB — RESP PANEL BY RT-PCR (FLU A&B, COVID) ARPGX2
Influenza A by PCR: NEGATIVE
Influenza B by PCR: NEGATIVE
SARS Coronavirus 2 by RT PCR: NEGATIVE

## 2021-08-01 MED ORDER — ZOLPIDEM TARTRATE 5 MG PO TABS
5.0000 mg | ORAL_TABLET | Freq: Every evening | ORAL | Status: DC | PRN
  Administered 2021-08-02: 5 mg via ORAL
  Filled 2021-08-01: qty 1

## 2021-08-01 MED ORDER — NIFEDIPINE ER OSMOTIC RELEASE 30 MG PO TB24
60.0000 mg | ORAL_TABLET | Freq: Every day | ORAL | Status: DC
  Administered 2021-08-01 – 2021-08-03 (×3): 60 mg via ORAL
  Filled 2021-08-01 (×4): qty 1

## 2021-08-01 MED ORDER — ACETAMINOPHEN 325 MG PO TABS
650.0000 mg | ORAL_TABLET | ORAL | Status: DC | PRN
  Administered 2021-08-01 – 2021-08-02 (×2): 650 mg via ORAL
  Filled 2021-08-01 (×2): qty 2

## 2021-08-01 MED ORDER — CALCIUM CARBONATE ANTACID 500 MG PO CHEW: 2.0000 | CHEWABLE_TABLET | ORAL | Status: DC | PRN

## 2021-08-01 MED ORDER — PRENATAL MULTIVITAMIN CH
1.0000 | ORAL_TABLET | Freq: Every day | ORAL | Status: DC
  Filled 2021-08-01: qty 1

## 2021-08-01 MED ORDER — DOCUSATE SODIUM 100 MG PO CAPS
100.0000 mg | ORAL_CAPSULE | Freq: Every day | ORAL | Status: DC
  Administered 2021-08-01 – 2021-08-02 (×2): 100 mg via ORAL
  Filled 2021-08-01 (×2): qty 1

## 2021-08-01 MED ORDER — ONDANSETRON 4 MG PO TBDP
4.0000 mg | ORAL_TABLET | Freq: Four times a day (QID) | ORAL | Status: DC | PRN
  Administered 2021-08-01 – 2021-08-02 (×3): 4 mg via ORAL
  Filled 2021-08-01 (×3): qty 1

## 2021-08-01 NOTE — Consult Note (Signed)
Cardiology Consultation:   Patient ID: [name] [name] MRN: [id number]; DOB: [birth year]  Admit date: 08/01/2021 Date of Consult: 08/01/2021  PCP:  Ezequiel Essex, MD   [facility] Providers Cardiologist:  Skeet Latch, MD        Patient Profile:   [name] [name] is a 35 y.o. 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 Roselie Awkward.  History of Present Illness:   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  She previously followed with Dr. Oval Linsey, last seen January 2021.  Echocardiogram 12/01/2019 showed EF 55 to 60%, right atrial mass.  TEE was done on 12/05/2019 which showed 1.6 cm x 1.1 cm mobile echogenic mass at the junction of the IVC and right atrium.  Atrial myxoma was suspected.  Cardiac MRI on 12/26/2020 showed 8 x 7 mm highly mobile mass attached by stock at junction of IVC and right atrium consistent with myxoma.  She was admitted in June 2021 with abdominal pain and underwent laparoscopic cholecystectomy.  She was also referred to cardiothoracic surgery but did not follow-up.  She was last seen in cardiology clinic by Odie Sera, NP on 01/18/2021.  Planning likely C-section tomorrow with anesthesia.  Vital signs show BP 143/88, pulse 118.  Labs notable for creatinine 0.36, hemoglobin 10.3.  She denies any chest pain, dyspnea, or lightheadedness.     Past Medical History:  Diagnosis Date   Abnormal Pap smear    f/u was normal   Chronic hypertension    COVID-19 10/21/2019   10/16/19   DEPRESSION, MAJOR, RECURRENT, MODERATE 04/18/2010   Qualifier: Diagnosis of  By: Georgina Snell MD, Darnelle Spangle)    Herpes simplex virus (HSV) infection    History of abnormal Pap smear 07/28/2012   History of preterm delivery 11/04/2018   Infection    trich, chlamydia,  gonorrhea   Ovarian cyst    PTSD (post-traumatic stress disorder)    Right atrial mass    Urinary tract infection     Past Surgical History:  Procedure Laterality Date   CESAREAN SECTION     CESAREAN SECTION N/A 09/23/2018   Procedure: CESAREAN SECTION;  Surgeon: Mora Bellman, MD;  Location: [place];  Service: Obstetrics;  Laterality: N/A;   CHOLECYSTECTOMY N/A 05/10/2020   Procedure: LAPAROSCOPIC CHOLECYSTECTOMY;  Surgeon: Ralene Ok, MD;  Location: [place];  Service: General;  Laterality: N/A;   TEE WITHOUT CARDIOVERSION N/A 12/05/2019   Procedure: TRANSESOPHAGEAL ECHOCARDIOGRAM (TEE);  Surgeon: Donato Heinz, MD;  Location: [facility];  Service: Endoscopy;  Laterality: N/A;   TONSILLECTOMY     WISDOM TOOTH EXTRACTION       Inpatient Medications: Scheduled Meds:  docusate sodium  100 mg Oral Daily   [START ON 08/02/2021] prenatal multivitamin  1 tablet Oral Q1200   Continuous Infusions:  PRN Meds: acetaminophen, calcium carbonate, zolpidem  Allergies:    Allergies  Allergen Reactions   Benadryl [Diphenhydramine] Itching    Pt reports being itchy after receiving benadryl through IV    Social History:   Social History   Socioeconomic History   Marital status: Single    Spouse name: Not on file   Number of children: 7   Years of education: 12   Highest education level: Not on file  Occupational History  Employer: Agricultural engineer  Tobacco Use   Smoking status: Never   Smokeless tobacco: Never  Vaping Use   Vaping Use: Never used  Substance and Sexual Activity   Alcohol use: Not Currently    Alcohol/week: 2.0 standard drinks    Types: 1 Glasses of wine, 1 Shots of liquor per week    Comment: Not currently. Last use May 2020   Drug use: Not Currently    Types: Marijuana    Comment: Not currently. Last use 2017   Sexual activity: Yes    Birth control/protection: None  Other Topics Concern   Not on file  Social History Narrative    Lives at home with four children   Drinks soda occasionally    Social Determinants of Health   Financial Resource Strain: Not on file  Food Insecurity: Food Insecurity Present   Worried About Charity fundraiser in the Last Year: Sometimes true   Arboriculturist in the Last Year: Never true  Transportation Needs: Public librarian (Medical): Yes   Lack of Transportation (Non-Medical): Yes  Physical Activity: Not on file  Stress: Not on file  Social Connections: Not on file  Intimate Partner Violence: Not on file    Family History:    Family History  Problem Relation Age of Onset   Hypertension Mother    Arthritis Mother    Hypertension Father    Asthma Brother    Cancer Maternal Uncle        lung   Diabetes Maternal Grandmother    Cancer Maternal Grandmother        thyroid   Hypertension Maternal Grandmother    Other Neg Hx      ROS:  Please see the history of present illness.   All other ROS reviewed and negative.     Physical Exam/Data:   Vitals:   08/01/21 1424 08/01/21 1425  BP: (!) 143/88   Pulse: (!) 118   Resp: (!) 22   Temp: 98.5 F (36.9 C)   TempSrc: Oral   SpO2:  97%   No intake or output data in the 24 hours ending 08/01/21 1648 Last 3 Weights 07/15/2021 06/17/2021 03/09/2021  Weight (lbs) 278 lb 11.2 oz 276 lb 4.8 oz 265 lb 8 oz  Weight (kg) 126.417 kg 125.329 kg 120.43 kg     There is no height or weight on file to calculate BMI.  General:   in no acute distress HEENT: normal Lymph: no adenopathy Neck: no JVD Vascular: No carotid bruits; FA pulses 2+ bilaterally without bruits  Cardiac:  normal S1, S2; RRR; no murmur  Lungs:  clear to auscultation bilaterally, no wheezing, rhonchi or rales  Abd: soft, nontender, no hepatomegaly  Ext: no edema Musculoskeletal:  No deformities, BUE and BLE strength normal and equal Skin: warm and dry  Neuro:  CNs 2-12 intact, no focal abnormalities noted Psych:  Normal  affect   EKG:  The EKG was personally reviewed and demonstrates:  pending Telemetry:  Telemetry was personally reviewed and demonstrates:  not on telemetry  Relevant CV Studies:   Laboratory Data:  High Sensitivity Troponin:  No results for input(s): TROPONINIHS in the last 720 hours.   ChemistryNo results for input(s): NA, K, CL, CO2, GLUCOSE, BUN, CREATININE, CALCIUM, GFRNONAA, GFRAA, ANIONGAP in the last 168 hours.  No results for input(s): PROT, ALBUMIN, AST, ALT, ALKPHOS, BILITOT in the last 168 hours. HematologyNo results for input(s): WBC, RBC,  HGB, HCT, MCV, MCH, MCHC, RDW, PLT in the last 168 hours. BNPNo results for input(s): BNP, PROBNP in the last 168 hours.  DDimer No results for input(s): DDIMER in the last 168 hours.   Radiology/Studies:  [place] MFM FETAL BPP WO NON STRESS  Result Date: 07/31/2021 ----------------------------------------------------------------------  OBSTETRICS REPORT                       (Signed Final 07/31/2021 10:23 am) ---------------------------------------------------------------------- Patient Info  ID #:       [id number]                          D.O.B.:  14-Sep-1986 (35 yrs)  Name:       Alexis Frock                 Visit Date: 07/31/2021 09:12 am ---------------------------------------------------------------------- Performed By  Attending:        Tama High MD        Ref. Address:     [address]                                                             [place], [place]  Performed By:     Lelan Pons RDMS       [facility]  Referred By:      [facility]  ---------------------------------------------------------------------- Orders  #  Description                           Code        Ordered By  1  [place] MFM FETAL BPP WO NON               76819.01    [place]  2  [place] MFM OB FOLLOW UP                   [id number]    Sander Nephew ----------------------------------------------------------------------  #  Order #                     Accession #                Episode #  1  [id number]                   [id number]                 [id number]  2  [id number]                   [id number]                 [id number] ---------------------------------------------------------------------- Indications  [redacted] weeks gestation of pregnancy                Z3A.38  Hypertension - Chronic/Pre-existing            O10.019  Pre-existing essential hypertension            123XX123  complicating pregnancy, third trimester  Hypertension - Chronic/Pre-existing            O10.019  (Procardia)  Insufficient Prenatal Care                     [id number]  Obesity complicating pregnancy, third          O99.213  trimester (pregravid BMI 43)  Previous cesarean delivery, antepartum x 2     Q000111Q  Medical complication of pregnancy (heart       O26.90  myxoma) ---------------------------------------------------------------------- Fetal Evaluation  Num Of Fetuses:         1  Fetal Heart Rate(bpm):  152  Cardiac Activity:       Observed  Presentation:           Cephalic  Placenta:               Posterior  P. Cord Insertion:      Previously Visualized  Amniotic Fluid  AFI FV:      Within normal limits  AFI Sum(cm)     %Tile       Largest Pocket(cm)  13.24           51          4.26  RUQ(cm)       RLQ(cm)       LUQ(cm)        LLQ(cm)  2.04          2.68  4.26           4.26 ---------------------------------------------------------------------- Biophysical Evaluation  Amniotic F.V:   Within normal  limits       F. Tone:        Observed  F. Movement:    Observed                   Score:          8/8  F. Breathing:   Observed ---------------------------------------------------------------------- Biometry  BPD:      89.8  mm     G. Age:  36w 3d         22  %    CI:        70.52   %    70 - 86                                                          FL/HC:      20.9   %    20.9 - 22.7  HC:      340.9  mm     G. Age:  39w 2d         53  %    HC/AC:      0.95        0.92 - 1.05  AC:      360.2  mm     G. Age:  40w 0d         95  %    FL/BPD:     79.5   %    71 - 87  FL:       71.4  mm     G. Age:  36w 4d         13  %    FL/AC:      19.8   %    20 - 24  Est. FW:    3564  gm    7 lb 14 oz      72  % ---------------------------------------------------------------------- OB History  Gravidity:    8         Term:   6        Prem:   1        SAB:   0  TOP:          0       Ectopic:  0        Living: 8 ---------------------------------------------------------------------- Gestational Age  LMP:           21w 5d        Date:  03/01/21                 EDD:   12/06/21  U/S Today:     38w 1d                                        EDD:   08/13/21  Best:          38w 3d     Det. By:  U/S  (03/09/21)          EDD:   08/11/21 ---------------------------------------------------------------------- Anatomy  Cranium:  Appears normal         Aortic Arch:            Previously seen  Cavum:                 Previously seen        Ductal Arch:            Not well visualized  Ventricles:            Appears normal         Diaphragm:              Previously seen  Choroid Plexus:        Not well visualized    Stomach:                Appears normal, left                                                                        sided  Cerebellum:            Previously seen        Abdomen:                Previously seen  Posterior Fossa:       Previously seen        Abdominal Wall:         Not well visualized  Nuchal Fold:           Not  applicable (Q000111Q    Cord Vessels:           Previously seen                         wks GA)  Face:                  Orbits and profile     Kidneys:                Appear normal                         previously seen  Lips:                  Previously seen        Bladder:                Appears normal  Thoracic:              Previously seen        Spine:                  Previously seen  Heart:                 Not well visualized    Upper Extremities:      Previously seen  RVOT:                  Previously seen        Lower Extremities:      Previously seen  LVOT:                  Previously seen  Other:  VC,  3VV and 3VTV previously visualized. Female gender previously          seen. ---------------------------------------------------------------------- Cervix Uterus Adnexa  Cervix  Not visualized (advanced GA >24wks)  Right Ovary  Visualized.  Left Ovary  Visualized. ---------------------------------------------------------------------- Impression  G8 P7.  Obstetric history significant for 3 cesarean deliveries  and 4 VBACs.  Her most recent delivery was VBAC.  Patient has chronic hypertension and takes nifedipine XL 60  mg daily.  Pressure today at her office is 134/98 mmHg.  She  has no symptoms and signs of severe features of  preeclampsia.  Patient had missed her prenatal appointment last week.  Amniotic fluid is normal and good fetal activity seen.  Fetal  growth is appropriate for gestational age.  Antenatal testing is  reassuring.  BPP 8/8.  Lower uterine segment appears  normal with intact bladder wall.  Patient informed that she will be making an appointment  today.  I will be sending an EPIC message to her provider. ---------------------------------------------------------------------- Recommendations  Delivery at 39 weeks' gestation. ----------------------------------------------------------------------                  Tama High, MD Electronically Signed Final Report   07/31/2021 10:23 am  ----------------------------------------------------------------------  [place] MFM OB FOLLOW UP  Result Date: 07/31/2021 ----------------------------------------------------------------------  OBSTETRICS REPORT                       (Signed Final 07/31/2021 10:23 am) ---------------------------------------------------------------------- Patient Info  ID #:       [id number]                          D.O.B.:  10-Nov-1986 (35 yrs)  Name:       Alexis Frock                 Visit Date: 07/31/2021 09:12 am ---------------------------------------------------------------------- Performed By  Attending:        Tama High MD        Ref. Address:     [address]                                                             [place], [place]  Performed By:     Lelan Pons RDMS       [facility]  Referred By:      [facility] ---------------------------------------------------------------------- Orders  #  Description                           Code        Ordered By  1  [place] MFM FETAL BPP WO NON               76819.01    [place]  2  [place] MFM OB FOLLOW UP                   [id number]    Sander Nephew ----------------------------------------------------------------------  #  Order #                     Accession #                Episode #  1  [id number]                   [id number]                 [id number]  2  [id number]                   [id number]                 [id number] ---------------------------------------------------------------------- Indications  [redacted] weeks gestation of pregnancy                 Z3A.38  Hypertension - Chronic/Pre-existing            O10.019  Pre-existing essential hypertension            [id number]  complicating pregnancy, third trimester  Hypertension - Chronic/Pre-existing            O10.019  (Procardia)  Insufficient Prenatal Care                     [id number]  Obesity complicating pregnancy, third          O99.213  trimester (pregravid BMI 43)  Previous cesarean delivery, antepartum x 2     Q000111Q  Medical complication of pregnancy (heart       O26.90  myxoma) ---------------------------------------------------------------------- Fetal Evaluation  Num Of Fetuses:         1  Fetal Heart Rate(bpm):  152  Cardiac  Activity:       Observed  Presentation:           Cephalic  Placenta:               Posterior  P. Cord Insertion:      Previously Visualized  Amniotic Fluid  AFI FV:      Within normal limits  AFI Sum(cm)     %Tile       Largest Pocket(cm)  13.24           51          4.26  RUQ(cm)       RLQ(cm)       LUQ(cm)        LLQ(cm)  2.04          2.68          4.26           4.26 ---------------------------------------------------------------------- Biophysical Evaluation  Amniotic F.V:   Within normal limits       F. Tone:        Observed  F. Movement:    Observed                   Score:          8/8  F. Breathing:   Observed ---------------------------------------------------------------------- Biometry  BPD:      89.8  mm     G. Age:  36w 3d         22  %    CI:        70.52   %    70 - 86                                                          FL/HC:      20.9   %    20.9 - 22.7  HC:      340.9  mm     G. Age:  39w 2d         53  %    HC/AC:      0.95        0.92 - 1.05  AC:      360.2  mm     G. Age:  40w 0d         95  %    FL/BPD:     79.5   %    71 - 87  FL:       71.4  mm     G. Age:  36w 4d         13  %    FL/AC:      19.8   %    20 - 24  Est. FW:    3564  gm    7 lb 14 oz      72  % ---------------------------------------------------------------------- OB History   Gravidity:    8         Term:   6        Prem:   1        SAB:   0  TOP:          0       Ectopic:  0        Living: 8 ----------------------------------------------------------------------  Gestational Age  LMP:           21w 5d        Date:  03/01/21                 EDD:   12/06/21  U/S Today:     38w 1d                                        EDD:   08/13/21  Best:          38w 3d     Det. By:  U/S  (03/09/21)          EDD:   08/11/21 ---------------------------------------------------------------------- Anatomy  Cranium:               Appears normal         Aortic Arch:            Previously seen  Cavum:                 Previously seen        Ductal Arch:            Not well visualized  Ventricles:            Appears normal         Diaphragm:              Previously seen  Choroid Plexus:        Not well visualized    Stomach:                Appears normal, left                                                                        sided  Cerebellum:            Previously seen        Abdomen:                Previously seen  Posterior Fossa:       Previously seen        Abdominal Wall:         Not well visualized  Nuchal Fold:           Not applicable (Q000111Q    Cord Vessels:           Previously seen                         wks GA)  Face:                  Orbits and profile     Kidneys:                Appear normal                         previously seen  Lips:                  Previously seen        Bladder:  Appears normal  Thoracic:              Previously seen        Spine:                  Previously seen  Heart:                 Not well visualized    Upper Extremities:      Previously seen  RVOT:                  Previously seen        Lower Extremities:      Previously seen  LVOT:                  Previously seen  Other:  VC, 3VV and 3VTV previously visualized. Female gender previously          seen. ---------------------------------------------------------------------- Cervix Uterus Adnexa  Cervix   Not visualized (advanced GA >24wks)  Right Ovary  Visualized.  Left Ovary  Visualized. ---------------------------------------------------------------------- Impression  G8 P7.  Obstetric history significant for 3 cesarean deliveries  and 4 VBACs.  Her most recent delivery was VBAC.  Patient has chronic hypertension and takes nifedipine XL 60  mg daily.  Pressure today at her office is 134/98 mmHg.  She  has no symptoms and signs of severe features of  preeclampsia.  Patient had missed her prenatal appointment last week.  Amniotic fluid is normal and good fetal activity seen.  Fetal  growth is appropriate for gestational age.  Antenatal testing is  reassuring.  BPP 8/8.  Lower uterine segment appears  normal with intact bladder wall.  Patient informed that she will be making an appointment  today.  I will be sending an EPIC message to her provider. ---------------------------------------------------------------------- Recommendations  Delivery at 39 weeks' gestation. ----------------------------------------------------------------------                  Tama High, MD Electronically Signed Final Report   07/31/2021 10:23 am ----------------------------------------------------------------------    Assessment and Plan:   Right atrial myxoma: TEE was done on 12/05/2019 which showed 1.6 cm x 1.1 cm mobile echogenic mass at the junction of the IVC and right atrium.  Atrial myxoma was suspected.  Cardiac MRI on 12/26/2020 showed 8 x 7 mm highly mobile mass attached by stock at junction of IVC and right atrium consistent with myxoma.  Cardiothoracic surgery referral was recommended but she did not follow-up. -No further work-up recommended at this time, will plan outpatient cardiothoracic surgery evaluation postpartum  Hypertension: Continue nifedipine 60 mg daily   For questions or updates, please contact [place] Please consult [email or web address] for contact info under    Signed, Donato Heinz,  MD  08/01/2021 4:48 PM
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
## 2021-08-02 ENCOUNTER — Ambulatory Visit: Payer: Medicaid Other

## 2021-08-02 ENCOUNTER — Other Ambulatory Visit: Payer: Self-pay | Admitting: Physician Assistant

## 2021-08-02 DIAGNOSIS — O34211 Maternal care for low transverse scar from previous cesarean delivery: Secondary | ICD-10-CM | POA: Diagnosis not present

## 2021-08-02 DIAGNOSIS — D151 Benign neoplasm of heart: Secondary | ICD-10-CM

## 2021-08-02 DIAGNOSIS — I10 Essential (primary) hypertension: Secondary | ICD-10-CM

## 2021-08-02 DIAGNOSIS — O134 Gestational [pregnancy-induced] hypertension without significant proteinuria, complicating childbirth: Secondary | ICD-10-CM | POA: Diagnosis not present

## 2021-08-02 DIAGNOSIS — Z3A39 39 weeks gestation of pregnancy: Secondary | ICD-10-CM | POA: Diagnosis not present

## 2021-08-02 LAB — RPR: RPR Ser Ql: NONREACTIVE

## 2021-08-02 LAB — HIV ANTIBODY (ROUTINE TESTING W REFLEX): HIV Screen 4th Generation wRfx: NONREACTIVE

## 2021-08-02 MED ORDER — OXYCODONE HCL 5 MG PO TABS
5.0000 mg | ORAL_TABLET | Freq: Once | ORAL | Status: AC
  Administered 2021-08-02: 5 mg via ORAL
  Filled 2021-08-02: qty 1

## 2021-08-02 MED ORDER — CYCLOBENZAPRINE HCL 10 MG PO TABS
10.0000 mg | ORAL_TABLET | Freq: Once | ORAL | Status: AC
  Administered 2021-08-02: 10 mg via ORAL
  Filled 2021-08-02: qty 1

## 2021-08-02 MED ORDER — OXYCODONE-ACETAMINOPHEN 5-325 MG PO TABS
1.0000 | ORAL_TABLET | Freq: Once | ORAL | Status: AC
  Administered 2021-08-02: 1 via ORAL
  Filled 2021-08-02: qty 1

## 2021-08-02 MED ORDER — POLYETHYLENE GLYCOL 3350 17 G PO PACK
17.0000 g | PACK | Freq: Every day | ORAL | Status: DC
  Administered 2021-08-02: 17 g via ORAL
  Filled 2021-08-02 (×2): qty 1

## 2021-08-02 NOTE — Plan of Care (Signed)
  Problem: Education: Goal: Knowledge of General Education information will improve Description: Including pain rating scale, medication(s)/side effects and non-pharmacologic comfort measures Outcome: Progressing   Problem: Health Behavior/Discharge Planning: Goal: Ability to manage health-related needs will improve Outcome: Progressing   Problem: Clinical Measurements: Goal: Ability to maintain clinical measurements within normal limits will improve Outcome: Progressing Goal: Will remain free from infection Outcome: Progressing Goal: Diagnostic test results will improve Outcome: Progressing Goal: Respiratory complications will improve Outcome: Progressing Goal: Cardiovascular complication will be avoided Outcome: Progressing   Problem: Safety: Goal: Ability to remain free from injury will improve Outcome: Progressing   Problem: Education: Goal: Knowledge of disease or condition will improve Outcome: Progressing Goal: Knowledge of the prescribed therapeutic regimen will improve Outcome: Progressing   Problem: Clinical Measurements: Goal: Complications related to the disease process, condition or treatment will be avoided or minimized Outcome: Progressing

## 2021-08-02 NOTE — Progress Notes (Signed)
No further cardiac work-up recommended prior to delivery.  Will plan repeat echocardiogram as outpatient, along with cardiology and cardiothoracic surgery follow-up.   [facility] will sign off.   Medication Recommendations:  None Other recommendations (labs, testing, etc):  Outpatient echo and cardiothoracic surgery follow-up Follow up as an outpatient:  Will schedule

## 2021-08-02 NOTE — Progress Notes (Signed)
[name] [name] is a 35 y.o. [id number] at 5w5dby ultrasound admitted for induction of labor due to Hypertension. She has had VBAC after 2 prior cesarean sections and declines RCS of possible, [facility] Attending Consult Note  35y.o. [phone] 385w5dith Estimated Date of Delivery: 08/11/21 was seen today in office to discuss trial of labor after cesarean section (TOLAC) versus elective repeat cesarean delivery (ERCD). The following risks were discussed with the patient.  Risk of uterine rupture at term is 0.78 percent with TOLAC and 0.22 percent with ERCD. 1 in 10 uterine ruptures will result in neonatal death or neurological injury. The benefits of a trial of labor after cesarean (TOLAC) resulting in a vaginal birth after cesarean (VBAC) include the following: shorter length of hospital stay and postpartum recovery (in most cases); fewer complications, such as postpartum fever, wound or uterine infection, thromboembolism (blood clots in the leg or lung), need for blood transfusion and fewer neonatal breathing problems. The risks of an attempted VBAC or TOLAC include the following: Risk of failed trial of labor after cesarean (TOLAC) without a vaginal birth after cesarean (VBAC) resulting in repeat cesarean delivery (RCD) in about 20 to 4068ercent of women who attempt VBAC.  Risk of rupture of uterus resulting in an emergency cesarean delivery. The risk of uterine rupture may be related in part to the type of uterine incision made during the first cesarean delivery. A previous transverse uterine incision has the lowest risk of rupture (0.2 to 1.5 percent risk). Vertical or T-shaped uterine incisions have a higher risk of uterine rupture (4 to 9 percent risk)The risk of fetal death is very low with both VBAC and elective repeat cesarean delivery (ERCD), but the likelihood of fetal death is higher with VBAC than with ERCD. Maternal death is very rare with either type of delivery. The risks of an  elective repeat cesarean delivery (ERCD) were reviewed with the patient including but not limited to: 01/999 risk of uterine rupture which could have serious consequences, bleeding which may require transfusion; infection which may require antibiotics; injury to bowel, bladder or other surrounding organs (bowel, bladder, ureters); injury to the fetus; need for additional procedures including hysterectomy in the event of a life-threatening hemorrhage; thromboembolic phenomenon; abnormal placentation; incisional problems; death and other postoperative or anesthesia complications.    These risks and benefits are summarized on the consent form, which was reviewed with the patient during the visit.  All her questions answered and she signed a consent indicating a preference for TOLAC/ERCD. A copy of the consent was given to the patient.     Subjective:occasional UC, good FM   Objective: BP 122/75   Pulse 100   Temp 98.2 F (36.8 C) (Oral)   Resp 18   Ht 5' 2.01" (1.575 m)   SpO2 99%   BMI 50.96 kg/m  No intake/output data recorded. No intake/output data recorded.  FHT:   Fetal Heart Rate A   Mode External  [removed] filed at 08/01/2021 2236  Baseline Rate (A) 135 bpm filed at 08/01/2021 2236  Variability 6-25 BPM filed at 08/01/2021 2236  Accelerations 15 x 15 filed at 08/01/2021 2236  Decelerations None filed at 08/01/2021 2236    SVE:   Dilation: 2.5 Effacement (%): 40 Station: -1 Exam by:: Dr. ArRoselie AwkwardLabs: Lab Results  Component Value Date   WBC 9.9 08/01/2021   HGB 10.3 (L) 08/01/2021   HCT 31.7 (L) 08/01/2021   MCV 94.3 08/01/2021  PLT 330 08/01/2021    Assessment / Plan: CHTN for IOL, TOLAC   Preeclampsia:  no signs or symptoms of toxicity Fetal Wellbeing:  Category I  I/D:  n/a Anticipated MOD:  NSVD, nneds to sign TOLAC consent  Emeterio Reeve 08/02/2021, 9:13 AM

## 2021-08-03 ENCOUNTER — Inpatient Hospital Stay (HOSPITAL_COMMUNITY): Payer: Medicaid Other | Admitting: Anesthesiology

## 2021-08-03 LAB — COMPREHENSIVE METABOLIC PANEL
ALT: 13 U/L (ref 0–44)
AST: 17 U/L (ref 15–41)
Albumin: 2.4 g/dL — ABNORMAL LOW (ref 3.5–5.0)
Alkaline Phosphatase: 86 U/L (ref 38–126)
Anion gap: 9 (ref 5–15)
BUN: 7 mg/dL (ref 6–20)
CO2: 21 mmol/L — ABNORMAL LOW (ref 22–32)
Calcium: 8.5 mg/dL — ABNORMAL LOW (ref 8.9–10.3)
Chloride: 104 mmol/L (ref 98–111)
Creatinine, Ser: 0.62 mg/dL (ref 0.44–1.00)
GFR, Estimated: >60 mL/min (ref >60)
Glucose, Bld: 115 mg/dL — ABNORMAL HIGH (ref 70–99)
Potassium: 3.5 mmol/L (ref 3.5–5.1)
Sodium: 134 mmol/L — ABNORMAL LOW (ref 135–145)
Total Bilirubin: 0.5 mg/dL (ref 0.3–1.2)
Total Protein: 5.8 g/dL — ABNORMAL LOW (ref 6.5–8.1)

## 2021-08-03 LAB — CBC
HCT: 34.9 % — ABNORMAL LOW (ref 36.0–46.0)
Hemoglobin: 11.1 g/dL — ABNORMAL LOW (ref 12.0–15.0)
MCH: 30.2 pg (ref 26.0–34.0)
MCHC: 31.8 g/dL (ref 30.0–36.0)
MCV: 94.8 fL (ref 80.0–100.0)
Platelets: 317 10*3/uL (ref 150–400)
RBC: 3.68 MIL/uL — ABNORMAL LOW (ref 3.87–5.11)
RDW: 13.7 % (ref 11.5–15.5)
WBC: 10.2 10*3/uL (ref 4.0–10.5)
nRBC: 0 % (ref 0.0–0.2)

## 2021-08-03 LAB — RAPID URINE DRUG SCREEN, HOSP PERFORMED
Amphetamines: NOT DETECTED
Barbiturates: NOT DETECTED
Benzodiazepines: NOT DETECTED
Cocaine: NOT DETECTED
Opiates: NOT DETECTED
Tetrahydrocannabinol: NOT DETECTED

## 2021-08-03 LAB — PROTEIN / CREATININE RATIO, URINE
Creatinine, Urine: 227.3 mg/dL
Protein Creatinine Ratio: 0.14 mg/mg{Cre} (ref 0.00–0.15)
Total Protein, Urine: 32 mg/dL

## 2021-08-03 MED ORDER — LIDOCAINE-EPINEPHRINE (PF) 2 %-1:200000 IJ SOLN
INTRAMUSCULAR | Status: DC | PRN
  Administered 2021-08-03: 3 mL via EPIDURAL

## 2021-08-03 MED ORDER — FENTANYL CITRATE (PF) 100 MCG/2ML IJ SOLN
50.0000 ug | INTRAMUSCULAR | Status: DC | PRN
  Filled 2021-08-03: qty 2

## 2021-08-03 MED ORDER — LACTATED RINGERS IV SOLN: 500.0000 mL | INTRAVENOUS | Status: DC | PRN

## 2021-08-03 MED ORDER — OXYTOCIN BOLUS FROM INFUSION
333.0000 mL | Freq: Once | INTRAVENOUS | Status: AC
  Administered 2021-08-04: 333 mL via INTRAVENOUS

## 2021-08-03 MED ORDER — PHENYLEPHRINE 40 MCG/ML (10ML) SYRINGE FOR IV PUSH (FOR BLOOD PRESSURE SUPPORT)
80.0000 ug | PREFILLED_SYRINGE | INTRAVENOUS | Status: DC | PRN
  Filled 2021-08-03: qty 10

## 2021-08-03 MED ORDER — ONDANSETRON HCL 4 MG/2ML IJ SOLN
4.0000 mg | Freq: Four times a day (QID) | INTRAMUSCULAR | Status: DC | PRN
Start: 2021-08-03 — End: 2021-08-04
  Administered 2021-08-03 (×2): 4 mg via INTRAVENOUS
  Filled 2021-08-03 (×2): qty 2

## 2021-08-03 MED ORDER — LIDOCAINE HCL (PF) 1 % IJ SOLN: 30.0000 mL | INTRAMUSCULAR | Status: DC | PRN

## 2021-08-03 MED ORDER — EPHEDRINE 5 MG/ML INJ: 10.0000 mg | INTRAVENOUS | Status: DC | PRN

## 2021-08-03 MED ORDER — TERBUTALINE SULFATE 1 MG/ML IJ SOLN: 0.2500 mg | Freq: Once | INTRAMUSCULAR | Status: DC | PRN

## 2021-08-03 MED ORDER — FENTANYL CITRATE (PF) 100 MCG/2ML IJ SOLN
INTRAMUSCULAR | Status: DC | PRN
  Administered 2021-08-03: 100 ug via EPIDURAL

## 2021-08-03 MED ORDER — LACTATED RINGERS IV SOLN: INTRAVENOUS | Status: DC

## 2021-08-03 MED ORDER — FENTANYL-BUPIVACAINE-NACL 0.5-0.125-0.9 MG/250ML-% EP SOLN
12.0000 mL/h | EPIDURAL | Status: DC | PRN
  Filled 2021-08-03: qty 250

## 2021-08-03 MED ORDER — LACTATED RINGERS IV SOLN: 500.0000 mL | Freq: Once | INTRAVENOUS | Status: DC

## 2021-08-03 MED ORDER — DIPHENHYDRAMINE HCL 50 MG/ML IJ SOLN
12.5000 mg | INTRAMUSCULAR | Status: DC | PRN
Start: 2021-08-03 — End: 2021-08-04

## 2021-08-03 MED ORDER — OXYCODONE-ACETAMINOPHEN 5-325 MG PO TABS
2.0000 | ORAL_TABLET | ORAL | Status: DC | PRN
  Administered 2021-08-04: 2 via ORAL
  Filled 2021-08-03: qty 2

## 2021-08-03 MED ORDER — ACETAMINOPHEN 325 MG PO TABS
650.0000 mg | ORAL_TABLET | ORAL | Status: DC | PRN
Start: 2021-08-03 — End: 2021-08-04

## 2021-08-03 MED ORDER — BUPIVACAINE HCL (PF) 0.25 % IJ SOLN
INTRAMUSCULAR | Status: DC | PRN
  Administered 2021-08-03: 8 mL via EPIDURAL

## 2021-08-03 MED ORDER — OXYTOCIN-SODIUM CHLORIDE 30-0.9 UT/500ML-% IV SOLN
1.0000 m[IU]/min | INTRAVENOUS | Status: DC
  Administered 2021-08-03: 1 m[IU]/min via INTRAVENOUS
  Filled 2021-08-03: qty 500

## 2021-08-03 MED ORDER — OXYCODONE-ACETAMINOPHEN 5-325 MG PO TABS: 1.0000 | ORAL_TABLET | ORAL | Status: DC | PRN

## 2021-08-03 MED ORDER — SOD CITRATE-CITRIC ACID 500-334 MG/5ML PO SOLN: 30.0000 mL | ORAL | Status: DC | PRN

## 2021-08-03 MED ORDER — PHENYLEPHRINE 40 MCG/ML (10ML) SYRINGE FOR IV PUSH (FOR BLOOD PRESSURE SUPPORT): 80.0000 ug | PREFILLED_SYRINGE | INTRAVENOUS | Status: DC | PRN

## 2021-08-03 MED ORDER — FENTANYL-BUPIVACAINE-NACL 0.5-0.125-0.9 MG/250ML-% EP SOLN
EPIDURAL | Status: DC | PRN
  Administered 2021-08-03: 12 mL/h via EPIDURAL

## 2021-08-03 MED ORDER — OXYTOCIN-SODIUM CHLORIDE 30-0.9 UT/500ML-% IV SOLN: 2.5000 [IU]/h | INTRAVENOUS | Status: DC

## 2021-08-03 NOTE — Progress Notes (Signed)
Pt observed googling "how long does cocaine stay in your system?" And "I've lost custody of my kids how do I get them back". UDS and social work consult placed.

## 2021-08-03 NOTE — Anesthesia Procedure Notes (Addendum)
Epidural Patient location during procedure: OB Start time: 08/03/2021 11:42 AM End time: 08/03/2021 12:10 PM  Staffing Anesthesiologist: Barnet Glasgow, MD Performed: anesthesiologist   Preanesthetic Checklist Completed: patient identified, IV checked, site marked, risks and benefits discussed, surgical consent, monitors and equipment checked, pre-op evaluation and timeout performed  Epidural Patient position: sitting Prep: DuraPrep and site prepped and draped Patient monitoring: continuous pulse ox and blood pressure Approach: midline Location: L2-L3 Injection technique: LOR air  Needle:  Needle type: Tuohy  Needle gauge: 17 G Needle length: 9 cm and 9 Needle insertion depth: 8 cm Catheter type: closed end flexible Catheter size: 19 Gauge Catheter at skin depth: 14 cm Test dose: negative  Assessment Events: blood not aspirated, injection not painful, no injection resistance, no paresthesia and negative IV test  Additional Notes Patient identified. Risks/Benefits/Options discussed with patient including but not limited to bleeding, infection, nerve damage, paralysis, failed block, incomplete pain control, headache, blood pressure changes, nausea, vomiting, reactions to medication both or allergic, itching and postpartum back pain. Confirmed with bedside nurse the patient's most recent platelet count. Confirmed with patient that they are not currently taking any anticoagulation, have any bleeding history or any family history of bleeding disorders. Patient expressed understanding and wished to proceed. All questions were answered. Sterile technique was used throughout the entire procedure. Please see nursing notes for vital signs. Test dose was given through epidural needle and negative prior to continuing to dose epidural or start infusion. Warning signs of high block given to the patient including shortness of breath, tingling/numbness in hands, complete motor block, or any  concerning symptoms with instructions to call for help. Patient was given instructions on fall risk and not to get out of bed. All questions and concerns addressed with instructions to call with any issues.  3 Attempt (S) . Patient tolerated procedure well. DRY cath placed

## 2021-08-03 NOTE — Progress Notes (Addendum)
Labor Progress Note [name] [name] is a 35 y.o. [id number] at 38w6dpresented for  IOL due to poorly controlled hypertension and history of atrial myxoma. Transfer from ante earlier this morning.   S: Patient is doing well, sitting comfortably watching TV  O:  BP (!) 146/71   Pulse (!) 107   Temp 98.1 F (36.7 C) (Oral)   Resp 17   Ht '5\' 2"'$  (1.575 m)   Wt 124.3 kg   SpO2 99%   BMI 50.12 kg/m  EFM: 140/mod/15x15/occasional variable   CVE: Dilation: 6 Effacement (%): 70 Cervical Position: Posterior Station: -2 Presentation: Vertex Exam by:: VGifford Shave MD    A&P: 35y.o. [id number] #Labor: Appropriate progression with pit 1x1.  Have better required to the cervix at this check AROM with clear fluid #Pain: PRN  #FWB: Category 1, position changes and will monitor  #GBS negative  #Chronic hypertension: Mildly elevated at 146/70 . Asx. A few severe range pressures earlier that improved on recheck prior to medication need. Pre-e labs WNL.   #History of C-section x2: First for NRFHTs in 2007 with several successful VBACs, then C-section for twin gestation and one successful VBAC afterwards. Will be cautious with induction.    #Atrial myxoma: Present atleast since 2020, successful vaginal delivery with this already known. Seen by cardiology, no change in management currently. O/p f/u with CVTS postpartum.   ViGifford ShaveMD 10:34 PM

## 2021-08-03 NOTE — Progress Notes (Signed)
Labor Progress Note [name] [name] is a 35 y.o. [id number] at 78w6dpresented for IOL due to poorly controlled hypertension and history of atrial myxoma. Transfer from antenatal after cardiology consult.   S: Doing well, feeling occasional contractions   O:  BP 124/79   Pulse (!) 108   Temp 98.2 F (36.8 C) (Oral)   Resp 17   Ht '5\' 2"'$  (1.575 m)   Wt 124.3 kg   SpO2 99%   BMI 50.12 kg/m  EFM: 150/mod/15x15/none  CVE: Dilation: 3.5 Effacement (%): Thick, 40 Cervical Position: Posterior Station: -3 Presentation: Vertex Exam by:: [name] Lav RN   A&P: 35y.o. [id number]#Labor: Promising initial cervical exam, will start with low dose pit 1x1 to assess tolerance.  #Pain: Anesthesia discussed extensively with patient on risks vs benefits of epidural, recommended early epidural, however patient would like to hold off. She is considering an epidural catheter without medication if needed.  #FWB: Cat 1  #GBS negative  #Chronic hypertension: Asymptomatic, previously poorly controlled however has been doing better with procardia XL '60mg'$  since antenatal admission. No severe range pressures. Recheck baseline Pre-e labs.   #History of C-section x2: First for NRFHTs in 2007 with several successful VBACs, then C-section for twin gestation and one successful VBAC afterwards. Will be cautious with induction.   #Atrial myxoma: Present atleast since 2020, successful vaginal delivery with this already known. Seen by cardiology, no change in management currently. O/p f/u with CVTS postpartum.   SaPatriciaann ClanDO 5:42 PM

## 2021-08-03 NOTE — Anesthesia Preprocedure Evaluation (Signed)
Anesthesia Evaluation  Patient identified by MRN, date of birth, ID band Patient awake    Reviewed: Allergy & Precautions, Patient's Chart, lab work & pertinent test results  Airway        Dental   Pulmonary neg pulmonary ROS,           Cardiovascular hypertension (Pre E),   Pt w Myxoma followed by Cards   Neuro/Psych  Headaches,    GI/Hepatic negative GI ROS, Neg liver ROS,   Endo/Other  negative endocrine ROS  Renal/GU negative Renal ROS     Musculoskeletal negative musculoskeletal ROS (+)   Abdominal   Peds  Hematology Lab Results      Component                Value               Date                      WBC                      10.2                08/03/2021                HGB                      11.1 (L)            08/03/2021                HCT                      34.9 (L)            08/03/2021                MCV                      94.8                08/03/2021                PLT                      317                 08/03/2021              Anesthesia Other Findings   Reproductive/Obstetrics (+) Pregnancy                             Anesthesia Physical Anesthesia Plan  ASA: 3  Anesthesia Plan: Epidural   Post-op Pain Management:    Induction:   PONV Risk Score and Plan:   Airway Management Planned:   Additional Equipment: None  Intra-op Plan:   Post-operative Plan:   Informed Consent: I have reviewed the patients History and Physical, chart, labs and discussed the procedure including the risks, benefits and alternatives for the proposed anesthesia with the patient or authorized representative who has indicated his/her understanding and acceptance.       Plan Discussed with:   Anesthesia Plan Comments: (38.6 wk G8P8c/s X2 PIH,Myoma ToLAC for LEA)        Anesthesia Quick Evaluation

## 2021-08-03 NOTE — Progress Notes (Signed)
Labor Progress Note [name] [name] is a 35 y.o. [id number] at 1w6dpresented for  IOL due to poorly controlled hypertension and history of atrial myxoma. Transfer from ante earlier this morning.   S: Doing well, feeling contractions a little more than earlier.  O:  BP 124/79   Pulse (!) 108   Temp 98.2 F (36.8 C) (Oral)   Resp 17   Ht '5\' 2"'$  (1.575 m)   Wt 124.3 kg   SpO2 99%   BMI 50.12 kg/m  EFM: 140/mod/15x15/occasional variable   CVE: Dilation: 5 Effacement (%): 70 Cervical Position: Posterior Station: -3 Presentation: Vertex Exam by:: SDarrelyn Hillock DO    A&P: 35y.o. [id number] #Labor: Appropriate progression with pit 1x1. Head poorly applied to cervix with what feels to be a few fingers laterally, will hold off on AROM for now.  #Pain: PRN  #FWB: Cat I/II, position changes and will monitor  #GBS negative  #Chronic hypertension: WNL currently. Asx. A few severe range pressures earlier that improved on recheck prior to medication need. Pre-e labs WNL.   #History of C-section x2: First for NRFHTs in 2007 with several successful VBACs, then C-section for twin gestation and one successful VBAC afterwards. Will be cautious with induction.    #Atrial myxoma: Present atleast since 2020, successful vaginal delivery with this already known. Seen by cardiology, no change in management currently. O/p f/u with CVTS postpartum.   SaPatriciaann ClanDO 5:49 PM

## 2021-08-04 ENCOUNTER — Encounter (HOSPITAL_COMMUNITY): Payer: Self-pay | Admitting: Obstetrics & Gynecology

## 2021-08-04 DIAGNOSIS — O134 Gestational [pregnancy-induced] hypertension without significant proteinuria, complicating childbirth: Secondary | ICD-10-CM

## 2021-08-04 DIAGNOSIS — Z3A39 39 weeks gestation of pregnancy: Secondary | ICD-10-CM

## 2021-08-04 DIAGNOSIS — O34211 Maternal care for low transverse scar from previous cesarean delivery: Secondary | ICD-10-CM

## 2021-08-04 LAB — CBC
HCT: 31.6 % — ABNORMAL LOW (ref 36.0–46.0)
HCT: 14.2 % — ABNORMAL LOW (ref 36.0–46.0)
HCT: 24.6 % — ABNORMAL LOW (ref 36.0–46.0)
Hemoglobin: 10 g/dL — ABNORMAL LOW (ref 12.0–15.0)
Hemoglobin: 4.6 g/dL — CL (ref 12.0–15.0)
Hemoglobin: 7.9 g/dL — ABNORMAL LOW (ref 12.0–15.0)
MCH: 30.2 pg (ref 26.0–34.0)
MCH: 31.7 pg (ref 26.0–34.0)
MCH: 30.6 pg (ref 26.0–34.0)
MCHC: 31.6 g/dL (ref 30.0–36.0)
MCHC: 32.4 g/dL (ref 30.0–36.0)
MCHC: 32.1 g/dL (ref 30.0–36.0)
MCV: 95.5 fL (ref 80.0–100.0)
MCV: 97.9 fL (ref 80.0–100.0)
MCV: 95.3 fL (ref 80.0–100.0)
Platelets: 277 10*3/uL (ref 150–400)
Platelets: 148 10*3/uL — ABNORMAL LOW (ref 150–400)
Platelets: 260 10*3/uL (ref 150–400)
RBC: 3.31 MIL/uL — ABNORMAL LOW (ref 3.87–5.11)
RBC: 1.45 MIL/uL — ABNORMAL LOW (ref 3.87–5.11)
RBC: 2.58 MIL/uL — ABNORMAL LOW (ref 3.87–5.11)
RDW: 13.6 % (ref 11.5–15.5)
RDW: 13.9 % (ref 11.5–15.5)
RDW: 13.5 % (ref 11.5–15.5)
WBC: 9.9 10*3/uL (ref 4.0–10.5)
WBC: 7.4 10*3/uL (ref 4.0–10.5)
WBC: 12 10*3/uL — ABNORMAL HIGH (ref 4.0–10.5)
nRBC: 0 % (ref 0.0–0.2)
nRBC: 0 % (ref 0.0–0.2)
nRBC: 0 % (ref 0.0–0.2)

## 2021-08-04 LAB — DIC (DISSEMINATED INTRAVASCULAR COAGULATION)PANEL
D-Dimer, Quant: 6.13 ug/mL-FEU — ABNORMAL HIGH (ref 0.00–0.50)
Fibrinogen: 251 mg/dL (ref 210–475)
INR: 1.3 — ABNORMAL HIGH (ref 0.8–1.2)
Platelets: 144 10*3/uL — ABNORMAL LOW (ref 150–400)
Prothrombin Time: 16.4 seconds — ABNORMAL HIGH (ref 11.4–15.2)
Smear Review: NONE SEEN
aPTT: 34 seconds (ref 24–36)

## 2021-08-04 LAB — POSTPARTUM HEMORRHAGE PROTOCOL (BB NOTIFICATION)

## 2021-08-04 LAB — PREPARE RBC (CROSSMATCH)

## 2021-08-04 MED ORDER — WITCH HAZEL-GLYCERIN EX PADS: 1.0000 "application " | MEDICATED_PAD | CUTANEOUS | Status: DC | PRN

## 2021-08-04 MED ORDER — DIBUCAINE (PERIANAL) 1 % EX OINT: 1.0000 "application " | TOPICAL_OINTMENT | CUTANEOUS | Status: DC | PRN

## 2021-08-04 MED ORDER — MISOPROSTOL 200 MCG PO TABS
600.0000 ug | ORAL_TABLET | Freq: Once | ORAL | Status: AC
  Administered 2021-08-04: 600 ug via RECTAL

## 2021-08-04 MED ORDER — NIFEDIPINE ER OSMOTIC RELEASE 30 MG PO TB24
30.0000 mg | ORAL_TABLET | Freq: Every day | ORAL | Status: DC
  Administered 2021-08-04 – 2021-08-06 (×3): 30 mg via ORAL
  Filled 2021-08-04 (×3): qty 1

## 2021-08-04 MED ORDER — FENTANYL CITRATE (PF) 100 MCG/2ML IJ SOLN
INTRAMUSCULAR | Status: AC
  Administered 2021-08-04: 100 ug
  Filled 2021-08-04: qty 2

## 2021-08-04 MED ORDER — SENNOSIDES-DOCUSATE SODIUM 8.6-50 MG PO TABS
2.0000 | ORAL_TABLET | ORAL | Status: DC
  Administered 2021-08-04 – 2021-08-06 (×3): 2 via ORAL
  Filled 2021-08-04 (×3): qty 2

## 2021-08-04 MED ORDER — MISOPROSTOL 200 MCG PO TABS
ORAL_TABLET | ORAL | Status: AC
  Filled 2021-08-04: qty 4

## 2021-08-04 MED ORDER — TETANUS-DIPHTH-ACELL PERTUSSIS 5-2.5-18.5 LF-MCG/0.5 IM SUSY: 0.5000 mL | PREFILLED_SYRINGE | Freq: Once | INTRAMUSCULAR | Status: DC

## 2021-08-04 MED ORDER — LACTATED RINGERS IV BOLUS
1000.0000 mL | Freq: Once | INTRAVENOUS | Status: DC
Start: 2021-08-04 — End: 2021-08-06

## 2021-08-04 MED ORDER — MISOPROSTOL 200 MCG PO TABS
400.0000 ug | ORAL_TABLET | Freq: Once | ORAL | Status: AC
  Administered 2021-08-04: 400 ug via ORAL

## 2021-08-04 MED ORDER — IBUPROFEN 600 MG PO TABS
600.0000 mg | ORAL_TABLET | Freq: Four times a day (QID) | ORAL | Status: DC
  Administered 2021-08-04 – 2021-08-06 (×9): 600 mg via ORAL
  Filled 2021-08-04 (×9): qty 1

## 2021-08-04 MED ORDER — COCONUT OIL OIL: 1.0000 "application " | TOPICAL_OIL | Status: DC | PRN

## 2021-08-04 MED ORDER — ONDANSETRON HCL 4 MG PO TABS: 4.0000 mg | ORAL_TABLET | ORAL | Status: DC | PRN

## 2021-08-04 MED ORDER — OXYTOCIN-SODIUM CHLORIDE 30-0.9 UT/500ML-% IV SOLN
INTRAVENOUS | Status: AC
  Filled 2021-08-04: qty 500

## 2021-08-04 MED ORDER — ONDANSETRON HCL 4 MG/2ML IJ SOLN: 4.0000 mg | INTRAMUSCULAR | Status: DC | PRN

## 2021-08-04 MED ORDER — TRANEXAMIC ACID-NACL 1000-0.7 MG/100ML-% IV SOLN
INTRAVENOUS | Status: AC
  Administered 2021-08-04: 1000 mg
  Filled 2021-08-04: qty 100

## 2021-08-04 MED ORDER — SIMETHICONE 80 MG PO CHEW: 80.0000 mg | CHEWABLE_TABLET | ORAL | Status: DC | PRN

## 2021-08-04 MED ORDER — ACETAMINOPHEN 325 MG PO TABS
650.0000 mg | ORAL_TABLET | ORAL | Status: DC | PRN
  Administered 2021-08-06: 650 mg via ORAL
  Filled 2021-08-04: qty 2

## 2021-08-04 MED ORDER — MISOPROSTOL 200 MCG PO TABS
ORAL_TABLET | ORAL | Status: AC
  Filled 2021-08-04: qty 1

## 2021-08-04 MED ORDER — LACTATED RINGERS IV BOLUS: 1000.0000 mL | Freq: Once | INTRAVENOUS | Status: DC

## 2021-08-04 MED ORDER — LACTATED RINGERS IV SOLN: INTRAVENOUS | Status: DC

## 2021-08-04 MED ORDER — ZOLPIDEM TARTRATE 5 MG PO TABS: 5.0000 mg | ORAL_TABLET | Freq: Every evening | ORAL | Status: DC | PRN

## 2021-08-04 MED ORDER — PRENATAL MULTIVITAMIN CH
1.0000 | ORAL_TABLET | Freq: Every day | ORAL | Status: DC
  Administered 2021-08-04 – 2021-08-06 (×3): 1 via ORAL
  Filled 2021-08-04 (×3): qty 1

## 2021-08-04 MED ORDER — BENZOCAINE-MENTHOL 20-0.5 % EX AERO: 1.0000 "application " | INHALATION_SPRAY | CUTANEOUS | Status: DC | PRN

## 2021-08-04 MED ORDER — SODIUM CHLORIDE 0.9% IV SOLUTION: Freq: Once | INTRAVENOUS | Status: DC

## 2021-08-04 MED ORDER — LACTATED RINGERS IV BOLUS
1000.0000 mL | Freq: Once | INTRAVENOUS | Status: AC
  Administered 2021-08-04: 1000 mL via INTRAVENOUS

## 2021-08-04 NOTE — Progress Notes (Signed)
Dr. Caron Presume and Wells Guiles, CNM at patient's bedside to perform a manual sweep. New orders received for Fentanyl and cytotec, IV fluids. STAT postpartum hemorrhage lab panel ordered. Code hemorrhage called overhead, team at bedside. Dr. Elgie Congo at patient's bedside, inserting a Jada in without difficulty, placed on 80 mmHg of suction and inflated with 100 cc of NS.

## 2021-08-04 NOTE — Progress Notes (Signed)
I responded to a page from the nurse to provide spiritual support for the patient. I visited the patient's room and provided support through pastoral presence, sharing words of encouragement, by reading scripture, and leading in prayer. The patient requested a Bible and I gave her a copy.    08/04/21 0822  Clinical Encounter Type  Visited With Patient  Visit Type Spiritual support  Referral From Nurse  Consult/Referral To Chaplain  Spiritual Encounters  Spiritual Needs Prayer;[facility] text    Chaplain Dr Redgie Grayer

## 2021-08-04 NOTE — Anesthesia Postprocedure Evaluation (Signed)
Anesthesia Post Note  Patient: [name] [name]  Procedure(s) Performed: AN AD HOC LABOR EPIDURAL     Patient location during evaluation: Mother Baby Anesthesia Type: Epidural Level of consciousness: awake Pain management: satisfactory to patient Vital Signs Assessment: post-procedure vital signs reviewed and stable Respiratory status: spontaneous breathing Cardiovascular status: stable Anesthetic complications: no   No notable events documented.  Last Vitals:  Vitals:   08/04/21 0700 08/04/21 0730  BP: (!) 128/97 (!) 109/91  Pulse: (!) 103 97  Resp: 18 20  Temp: 37.6 C 37.3 C  SpO2: 100% 100%    Last Pain:  Vitals:   08/04/21 0839  TempSrc:   PainSc: 0-No pain   Pain Goal: Patients Stated Pain Goal: 4 (08/02/21 1133)              Epidural/Spinal Function Cutaneous sensation: Normal sensation (08/04/21 0839)  Casimer Lanius

## 2021-08-04 NOTE — Progress Notes (Signed)
Jada suction turned off per Dr. Elgie Congo.

## 2021-08-04 NOTE — Discharge Summary (Signed)
Postpartum Discharge Summary   Patient Name: [name] [name] DOB: 19-Feb-1986 MRN: [id number]  Date of admission: 08/01/2021 Delivery date:08/04/2021  Delivering provider: Gifford Shave  Date of discharge: 08/06/2021  Admitting diagnosis: Supervision of high risk pregnancy, antepartum [O09.90] Intrauterine pregnancy: [redacted]w[redacted]d    Secondary diagnosis:  Active Problems:   Chronic hypertension   Atrial myxoma   Supervision of high risk pregnancy, antepartum  Additional problems: none    Discharge diagnosis: Term Pregnancy Delivered and CHTN                                              Post partum procedures:blood transfusion Augmentation: AROM and Pitocin Complications: None  Hospital course: Induction of Labor With Vaginal Delivery   35y.o. yo [id number] admitted to the hospital 08/01/2021 for induction of labor.  Indication for induction:  CHTN .  Patient had an uncomplicated labor course as follows: Membrane Rupture Time/Date: 10:26 PM ,08/03/2021   Delivery Method:Vaginal, Spontaneous  Episiotomy: None  Lacerations:  None  Details of delivery can be found in separate delivery note.  Patient had a routine postpartum course. Patient is discharged home 08/06/21.  Newborn Data: Birth date:08/04/2021  Birth time:12:06 AM  Gender:Female  Living status:Living  Apgars:9 ,9  Weight:3481 g   Magnesium Sulfate received: No BMZ received: No Rhophylac:No MMR:Yes T-DaP:Given prenatally Flu: N/A Transfusion:Yes  Physical exam  Vitals:   08/04/21 2127 08/05/21 0530 08/05/21 2110 08/06/21 0524  BP: 125/87 130/85 138/86 128/60  Pulse: 94 (!) 102 (!) 119 (!) 112  Resp: 17 18 18 18   Temp: 98.3 F (36.8 C) 98.3 F (36.8 C) 98.8 F (37.1 C) 98.3 F (36.8 C)  TempSrc: Oral Oral Oral Oral  SpO2: 99% 99% 100% 100%  Weight:      Height:       General: alert, cooperative, and no distress Lochia: appropriate Uterine Fundus: firm Incision: N/A DVT Evaluation: No evidence of DVT  seen on physical exam. Labs: Lab Results  Component Value Date   WBC 12.7 (H) 08/05/2021   HGB 10.1 (L) 08/05/2021   HCT 29.9 (L) 08/05/2021   MCV 91.4 08/05/2021   PLT 275 08/05/2021   CMP Latest Ref Rng & Units 08/03/2021  Glucose 70 - 99 mg/dL 115(H)  BUN 6 - 20 mg/dL 7  Creatinine 0.44 - 1.00 mg/dL 0.62  Sodium 135 - 145 mmol/L 134(L)  Potassium 3.5 - 5.1 mmol/L 3.5  Chloride 98 - 111 mmol/L 104  CO2 22 - 32 mmol/L 21(L)  Calcium 8.9 - 10.3 mg/dL 8.5(L)  Total Protein 6.5 - 8.1 g/dL 5.8(L)  Total Bilirubin 0.3 - 1.2 mg/dL 0.5  Alkaline Phos 38 - 126 U/L 86  AST 15 - 41 U/L 17  ALT 0 - 44 U/L 13   Edinburgh Score: Edinburgh Postnatal Depression Scale Screening Tool 01/13/2020  I have been able to laugh and see the funny side of things. 1  I have looked forward with enjoyment to things. 1  I have blamed myself unnecessarily when things went wrong. 0  I have been anxious or worried for no good reason. 3  I have felt scared or panicky for no good reason. 2  Things have been getting on top of me. 2  I have been so unhappy that I have had difficulty sleeping. 2  I have felt  sad or miserable. 2  I have been so unhappy that I have been crying. 2  The thought of harming myself has occurred to me. 0  Edinburgh Postnatal Depression Scale Total 15     After visit meds:  Allergies as of 08/06/2021       Reactions   Benadryl [diphenhydramine] Itching   Pt reports being itchy after receiving benadryl through IV        Medication List     TAKE these medications    acetaminophen 325 MG tablet Commonly known as: Tylenol Take 2 tablets (650 mg total) by mouth every 4 (four) hours as needed (for pain scale < 4).   Blood Pressure Kit Devi 1 Device by Does not apply route as needed.   GNP Digital Weight Scale Misc 1 each by Does not apply route as needed.   ibuprofen 600 MG tablet Commonly known as: ADVIL Take 1 tablet (600 mg total) by mouth every 6 (six) hours.    NIFEdipine 60 MG 24 hr tablet Commonly known as: ADALAT CC Take 1 tablet (60 mg total) by mouth daily.   promethazine 25 MG tablet Commonly known as: PHENERGAN Take 1 tablet (25 mg total) by mouth every 6 (six) hours as needed for nausea or vomiting.         Discharge home in stable condition Infant Feeding: Breast Infant Disposition: unclear at this time  Discharge instruction: per After Visit Summary and Postpartum booklet. Activity: Advance as tolerated. Pelvic rest for 6 weeks.  Diet: routine diet Future Appointments: Future Appointments  Date Time Provider [place]  08/07/2021 10:35 AM Clarnce Flock, MD [facility]  08/26/2021  9:35 AM [facility]  08/29/2021  2:20 PM Skeet Latch, MD [facility]   Follow up Visit: Roma Schanz, CNM  P [facility] Admin Pool Please schedule this patient for PP visit in: bp check in 1wk, pp visit in 4-6wks  High risk pregnancy complicated by: CHTN, heart mass  Delivery mode:  SVD/VBAC  Anticipated Birth Control:  Depo  PP Procedures needed: BP check  Schedule Integrated BH visit: yes  Provider: Any provider   08/06/2021 Gifford Shave, MD

## 2021-08-04 NOTE — Progress Notes (Signed)
Pt very rude since RN arrived   Been in there since 715 helping her with baby etc but continues to be rude/rolling eyes/etc

## 2021-08-04 NOTE — Progress Notes (Signed)
Dr. Gifford Shave notified of patient's increased bleeding and passing of small clots. Patient lying flat in bed, states and complains of feeling blood gushing out

## 2021-08-04 NOTE — Progress Notes (Signed)
Patient stated 30 minutes after arrival to [facility], that she felt like she was bleeding a lot, was that normal? Patient was checked. Patient had 2 small clots, half dollar size on her ice pack, bleeding had increased from her initial check. Patient's fundus is firm. Patient cleaned and new pad and mesh underwear put on. Patient states that she is nauseas and a little light headed.  Dr. Gifford Shave at bedside and evaluated patient. New order for CBC in am. Patient to monitored closely.

## 2021-08-04 NOTE — Lactation Note (Signed)
This note was copied from a baby's chart. Lactation Consultation Note  Patient Name: [name] [name] [id number] Date: 08/04/2021 Reason for consult: Initial assessment;Term Age:33 hours Mom 's feeding choice and breast and formula feeding, mom has mostly been breastfeeding she gave infant one bottle of formula since birth. Per mom ,breastfeeding is going well infant is breastfeeding on both breast for 20 minutes. LC did not observe recent feeding, infant breastfeed at 1640 pm for 20 minutes. LC reviewed hand expression and infant was given 5 mls of colostrum when mom was doing skin to skin with infant.  Mom given hand pump with 30 mm breast flange mom has large diameter nipples. Mom doesn't have any questions or concerns for LC at this time. Mom's plan: 1- Mom will continue to breastfeed infant according to feeding cues, 8 to 12+ times within 24 hours, skin to skin. 2- Mom knows she can hand express  or use hand pump to give infant extra volume of EBM  if she choose. 3- Mom will call RN/LC if she has any breastfeeding questions, concerns or needs assistance with latching infant at the breast.  Maternal Data Has patient been taught Hand Expression?: Yes Does the patient have breastfeeding experience prior to this delivery?: Yes  Feeding Mother's Current Feeding Choice: Breast Milk and Formula  LATCH Score                    Lactation Tools Discussed/Used Tools: Pump Breast pump type: Manual Pump Education: Setup, frequency, and cleaning;Milk Storage Reason for Pumping: Prn use mom fitted with 30 mm NS  Interventions Interventions: Breast feeding basics reviewed;Skin to skin;Position options;Hand express;Hand pump  Discharge Pump: Manual WIC Program: Yes  Consult Status Consult Status: Follow-up Date: 08/05/21 Follow-up type: In-patient    Vicente Serene 08/04/2021, 5:43 PM

## 2021-08-04 NOTE — Lactation Note (Signed)
This note was copied from a baby's chart. Lactation Consultation Note  Patient Name: [name] [name] [id number] Date: 08/04/2021 Reason for consult: L&D Initial assessment;Term Age:33 hours P9, term female infant. Mom is experienced mom with breastfeeding she breastfeed her other 8 children for 2 to 3 years each. LC entered L&D room, mom was doing skin to skin with infant.  Mom latched infant on her left breast using the football hold position infant latched with depth, LC assisted with latch due mom having blood drawn from her right arm. Infant sustained latch and breastfeed for 15 minutes. Mom knows to breastfeed infant according to primal cues: licking, kissing, tasting, smacking, hands and fist in mouth, skin to skin.  Mom knows  to call [facility] on M[facility], if she has any questions, concerns or need latch assistance  Maternal Data Has patient been taught Hand Expression?: Yes Does the patient have breastfeeding experience prior to this delivery?: Yes How long did the patient breastfeed?: Per mom, she breastfeed her other 8 children for 2 to 3 years each  Feeding Mother's Current Feeding Choice: Breast Milk and Formula  LATCH Score Latch: Grasps breast easily, tongue down, lips flanged, rhythmical sucking.  Audible Swallowing: Spontaneous and intermittent  Type of Nipple: Everted at rest and after stimulation  Comfort (Breast/Nipple): Soft / non-tender  Hold (Positioning): Full assist, staff holds infant at breast  LATCH Score: 8   Lactation Tools Discussed/Used    Interventions Interventions: Breast feeding basics reviewed;Assisted with latch;Breast compression;Skin to skin;Support pillows;Adjust position;Breast massage;Position options  Discharge    Consult Status Consult Status: Follow-up Date: 08/04/21 Follow-up type: In-patient    Vicente Serene 08/04/2021, 1:29 AM

## 2021-08-04 NOTE — Progress Notes (Signed)
Per Johney Maine, [facility], [place] worker, there are barriers to discharge. Per Ricard Dillon, someone from [facility] should be on campus today.   Since consult stated, MOB was observed googling, "how long does cocaine stay in your system"? CPS requests drug screening on infant.   CSW inform Corrie Dandy, of [place] requests.   Darcus Austin, MSW, LCSW-A Clinical Social Worker- Weekends [phone]

## 2021-08-04 NOTE — Progress Notes (Signed)
Jada removed at 8 am

## 2021-08-04 NOTE — Progress Notes (Signed)
Blood transfusion started per protocol.

## 2021-08-04 NOTE — Progress Notes (Signed)
CTSP regarding urgent postpartum hemorrhage.  Upon my arrival  total EBL was approximately 1300 ml.  Pt had received pitocin, cytotec and TXA.  Pt also received IV fluids and had the foley replaced.  The midwife had performed a uterine sweep and had retrieved a large amount of clots.  Decision was made to place the [place] device.  Corey Skains was placed in sterile fashion into the lower uterine segment.  Vaginal balloon was inflated to 90 ml of saline.  Suction was initiated at a pressure of 80-85 ml Hg pressure. After a short interval, the uterus was felt to firm and a small amount of blood was in the tubing.  Device will remain in place for 2-3 hours while awaiting DIC labs.  Pt is currently stable.  Lynnda Shields, MD

## 2021-08-04 NOTE — Progress Notes (Addendum)
Patient ID: Annyah Sundstrom, female   DOB: 02-Jul-1986, 35 y.o.   MRN: [id number]:  Called to come evaluate pt for hemorrhage Pt laying in bed, app 604 QBL out since arrival to [facility]. Gave 148mg Fentanyl IV x 1, then LUS sweep by Dr. CCaron Presumew/ removal of app 200cc clots, pt began to feel 'numb', ordered cytotec 10060m, IVF, labs. LUS and uterine sweep by me w/ removal of app another 800cc clots, uterus firm. Called for Dr. BaCatha Gosselinemorrhage. Foley placed, 60062mcytotec PR, 400m38muccal. LR bolus, pitocin bolus.  Jada placed by Dr. BassElgie Congo difficulty, inflated w/ 100cc and hooked to wall suction.  TXA given. Uterus firm and bleeding stopped.  Lab draw still pending. Will monitor closely. Total [id number] SchanzM, WHNPSame Day Procedures LLC/2022 4:33 AM
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
## 2021-08-05 DIAGNOSIS — O34211 Maternal care for low transverse scar from previous cesarean delivery: Secondary | ICD-10-CM | POA: Diagnosis not present

## 2021-08-05 DIAGNOSIS — Z3A39 39 weeks gestation of pregnancy: Secondary | ICD-10-CM | POA: Diagnosis not present

## 2021-08-05 DIAGNOSIS — O134 Gestational [pregnancy-induced] hypertension without significant proteinuria, complicating childbirth: Secondary | ICD-10-CM | POA: Diagnosis not present

## 2021-08-05 LAB — CBC
HCT: 29.9 % — ABNORMAL LOW (ref 36.0–46.0)
Hemoglobin: 10.1 g/dL — ABNORMAL LOW (ref 12.0–15.0)
MCH: 30.9 pg (ref 26.0–34.0)
MCHC: 33.8 g/dL (ref 30.0–36.0)
MCV: 91.4 fL (ref 80.0–100.0)
Platelets: 275 10*3/uL (ref 150–400)
RBC: 3.27 MIL/uL — ABNORMAL LOW (ref 3.87–5.11)
RDW: 15.7 % — ABNORMAL HIGH (ref 11.5–15.5)
WBC: 12.7 10*3/uL — ABNORMAL HIGH (ref 4.0–10.5)
nRBC: 0 % (ref 0.0–0.2)

## 2021-08-05 LAB — TYPE AND SCREEN
ABO/RH(D): A NEG
Antibody Screen: NEGATIVE
Unit division: 0
Unit division: 0

## 2021-08-05 LAB — BPAM RBC
Blood Product Expiration Date: 202209102359
Blood Product Expiration Date: 202209102359
ISSUE DATE / TIME: 202209040415
ISSUE DATE / TIME: 202209040415
Unit Type and Rh: 600
Unit Type and Rh: 600

## 2021-08-05 MED ORDER — MEDROXYPROGESTERONE ACETATE 150 MG/ML IM SUSP
150.0000 mg | Freq: Once | INTRAMUSCULAR | Status: AC
  Administered 2021-08-05: 150 mg via INTRAMUSCULAR
  Filled 2021-08-05: qty 1

## 2021-08-05 MED ORDER — OXYCODONE HCL 5 MG PO TABS
5.0000 mg | ORAL_TABLET | ORAL | Status: DC | PRN
  Administered 2021-08-05 – 2021-08-06 (×2): 5 mg via ORAL
  Filled 2021-08-05 (×2): qty 1

## 2021-08-05 MED ORDER — RHO D IMMUNE GLOBULIN 1500 UNIT/2ML IJ SOSY
300.0000 ug | PREFILLED_SYRINGE | Freq: Once | INTRAMUSCULAR | Status: AC
  Administered 2021-08-05: 300 ug via INTRAVENOUS
  Filled 2021-08-05: qty 2

## 2021-08-05 NOTE — Lactation Note (Signed)
This note was copied from a baby's chart. Lactation Consultation Note  Patient Name: [name] [name] [id number] Date: 08/05/2021 Reason for consult: Follow-up assessment;Term Age:33 hours  Mom holding sleeping baby, preparing to eat dinner. Reports unsure if baby is getting anything when at the breast, states baby just had formula feeding ~7pm. Mom agreed to call LC to assess next feeding. BGilliam, RN, IBCLC   Feeding Mother's Current Feeding Choice: Breast Milk and Formula Nipple Type: Extra Slow Flow    Interventions Interventions: Education    Consult Status Consult Status: Follow-up Date: 08/05/21 Follow-up type: In-patient    Bernita Buffy 08/05/2021, 7:57 PM

## 2021-08-05 NOTE — Lactation Note (Signed)
This note was copied from a baby's chart. Lactation Consultation Note  Patient Name: [name] [name] [id number] Date: 08/05/2021 Reason for consult: Follow-up assessment;Mother's request;Term Age:33 hours  In to see mom per request to assist feeding. Baby is already latched deeply to right breast cradle hold. Mom reports concerns that baby is not getting any milk. LC pointed out multiple audible swallows, mom correctly identified swallows as well. LC changed large brown stool diaper prior to latching to opposite breast. Mom latched to left breast cradle hold with minimal assistance, mom noted multiple audible swallows.   Reinforced feeding frequency, skin to skin, signs of adequate milk transfer, expected length of feeds, cluster feeds. Mom voiced understanding and with no further concerns. Left the room with baby still latched to left breast ~ 13mn mark.  Plan - feed on cue 8-12 times a day - wake if >3-4hrs since last feeding - continuous skin to skin while awake and alert - expect yellow seedy stools by day 5 - call if lactation support needed prior to next visit  BGilliam, RN, IBCLC   Maternal Data    Feeding Mother's Current Feeding Choice: Breast Milk and Formula Nipple Type: Extra Slow Flow  LATCH Score Latch: Grasps breast easily, tongue down, lips flanged, rhythmical sucking.  Audible Swallowing: Spontaneous and intermittent  Type of Nipple: Everted at rest and after stimulation  Comfort (Breast/Nipple): Soft / non-tender  Hold (Positioning): Assistance needed to correctly position infant at breast and maintain latch.  LATCH Score: 9   Lactation Tools Discussed/Used    Interventions Interventions: Breast feeding basics reviewed;Assisted with latch;Skin to skin;Breast compression  Discharge    Consult Status Consult Status: Follow-up Date: 08/06/21 Follow-up type: In-patient    BBernita Buffy9/03/2021, 10:21 PM

## 2021-08-05 NOTE — Progress Notes (Addendum)
Went to evaluate patient this morning.  She was sleepiing and her child was crying and was covered in spit up.  Patient baby was not wrapped.  I woke the patient up and told her that the baby was crying she sat up and said  "I do not know why" then went back to sleep.  I cleaned up the infant and swaddled him.  I tried to have my discussion with the patient regarding how she was doing this morning and she would not respond to any questions.  My colleague will reevaluate her later today.

## 2021-08-05 NOTE — Progress Notes (Signed)
Post Partum Day #1 Subjective: no complaints, up ad lib, and tolerating PO; she is breastfeeding; desires depo for contraception; denies dizziness w ambulation; she would like for her son to be circumcised- she was consented for this a note was placed on the infant's chart  Objective: Blood pressure 130/85, pulse (!) 102, temperature 98.3 F (36.8 C), temperature source Oral, resp. rate 18, height '5\' 2"'$  (1.575 m), weight 124.3 kg, SpO2 99 %, unknown if currently breastfeeding.  Physical Exam:  General: alert, cooperative, and no distress Lochia: appropriate Uterine Fundus: firm DVT Evaluation: No evidence of DVT seen on physical exam.  Recent Labs    08/04/21 0719 08/05/21 0502  HGB 7.9* 10.1*  HCT 24.6* 29.9*    Assessment/Plan: Plan for discharge tomorrow and Circumcision prior to discharge S/P PPH with 2uPRBCs> Hgb 10.1 this morning and she is asymptomatic Per SW note, there are barriers to infant being discharged with patient   LOS: 4 days   Myrtis Ser CNM 08/05/2021, 3:48 PM

## 2021-08-05 NOTE — Progress Notes (Signed)
Patent c/o of aching chest pain that she rates 8-9/10. The pain radiates to the right shoulder, but the pt denies SOB and lightheadedness. [name] [name] was notified, and an EKG was ordered, but the pt declined EKG, stating that she wanted to see her cardiologist. The patient was seen by her cardiologist on 08/01/21, saying that no further work-up is recommended at this time, with plan to follow up with outpatient cardiothoracic surgery evaluation postpartum. The patient is stable and is in no acute distress at this time.

## 2021-08-06 DIAGNOSIS — O34211 Maternal care for low transverse scar from previous cesarean delivery: Secondary | ICD-10-CM | POA: Diagnosis not present

## 2021-08-06 DIAGNOSIS — O134 Gestational [pregnancy-induced] hypertension without significant proteinuria, complicating childbirth: Secondary | ICD-10-CM | POA: Diagnosis not present

## 2021-08-06 DIAGNOSIS — Z3A39 39 weeks gestation of pregnancy: Secondary | ICD-10-CM | POA: Diagnosis not present

## 2021-08-06 LAB — RH IG WORKUP (INCLUDES ABO/RH)
ABO/RH(D): A NEG
Antibody Screen: NEGATIVE
Fetal Screen: NEGATIVE
Gestational Age(Wks): 39
Unit division: 0

## 2021-08-06 LAB — RUBEOLA ANTIBODY IGG: Rubeola IgG: 125 [AU]/ml

## 2021-08-06 MED ORDER — IBUPROFEN 600 MG PO TABS: 600.0000 mg | ORAL_TABLET | Freq: Four times a day (QID) | ORAL | 0 refills | Status: DC

## 2021-08-06 MED ORDER — ACETAMINOPHEN 325 MG PO TABS: 650.0000 mg | ORAL_TABLET | ORAL | Status: DC | PRN

## 2021-08-06 NOTE — Lactation Note (Addendum)
This note was copied from a baby's chart. Lactation Consultation Note  Patient Name: [name] [name] [id number] Date: 08/06/2021 Reason for consult: Follow-up assessment;Term Age:33 hours Baby returned from a circ .  Baby awake and fussy . Mom handled the baby well to latch on the left breast / with depth / football / milk is coming in / increased swallows with compressions.  Per mom comfortable. Baby fed 10 mins and sustained the latch.  Baby re-latched with depth and mom was able to latch by herself.    Maternal Data Has patient been taught Hand Expression?: Yes  Feeding Mother's Current Feeding Choice: Breast Milk and Formula  LATCH Score Latch: Grasps breast easily, tongue down, lips flanged, rhythmical sucking.  Audible Swallowing: Spontaneous and intermittent  Type of Nipple: Everted at rest and after stimulation  Comfort (Breast/Nipple): Filling, red/small blisters or bruises, mild/mod discomfort  Hold (Positioning): Assistance needed to correctly position infant at breast and maintain latch.  LATCH Score: 8   Lactation Tools Discussed/Used Tools: Pump Flange Size: 30 Breast pump type: Manual Pump Education: Milk Storage  Interventions Interventions: Breast feeding basics reviewed;Assisted with latch;Skin to skin;Breast massage;Hand express;Breast compression;Hand pump;Education  Discharge Discharge Education: Engorgement and breast care Pump: Manual  Consult Status Consult Status: Complete Date: 08/06/21    Myer Haff 08/06/2021, 12:50 PM

## 2021-08-06 NOTE — Social Work (Signed)
CSW attended CFT. [place] reported they will file a petition and stated MOB cannot have contact with the infant after one last visit today. CSW awaiting a final decision from CPS in regard to infant disposition.   CSW and nurse present for MOB last visit with infant. Infant then taken to the nursery.   CSW provided emotional support and discussed mental health follow up with MOB.   CSW provided a taxi voucher to transport MOB home.    Barriers to infant's discharge.  Charge Nurse aware.   [name] [name], MSW, LCSW Women's and [place] Worker  [phone] 08/06/2021  5:16 PM

## 2021-08-06 NOTE — Social Work (Signed)
Per CPS social worker Martinique Reazes [phone] the Child and Family Team Meeting with MOB  is schedule for 2:30 PM and will be held at the hospital. MOB was made aware by CPS. MOB very tearful. CSW offered emotional support.   Nurse made aware.  Barriers to discharge at this time.   Kathrin Greathouse, MSW, LCSW Women's and [place] Worker  [phone] 08/06/2021  11:29 AM

## 2021-08-07 ENCOUNTER — Encounter: Payer: Medicaid Other | Admitting: Family Medicine

## 2021-08-07 LAB — SURGICAL PATHOLOGY

## 2021-08-09 NOTE — BH Specialist Note (Deleted)
Integrated Behavioral Health via Telemedicine Visit  08/09/2021 Mckenleigh Chiles [id number]  Number of [place] visits: *** Session Start time: 10:15***Session End time: 11:15*** Total time: {IBH Total Time:21014050}  Referring Provider: *** Patient/Family location: Home*** [facility] Provider location: Center for [place] [facility] for Women  All persons participating in visit: Patient *** and [place] ***  Types of Service: {CHL AMB TYPE OF SERVICE:[id number]}  I connected with [name] and/or Nori Colocho's {family members:20773} via  Telephone or Video Enabled Telemedicine Application  (Video is Caregility application) and verified that I am speaking with the correct person using two identifiers. Discussed confidentiality: {YES/NO:21197}  I discussed the limitations of telemedicine and the availability of in person appointments.  Discussed there is a possibility of technology failure and discussed alternative modes of communication if that failure occurs.  I discussed that engaging in this telemedicine visit, they consent to the provision of behavioral healthcare and the services will be billed under their insurance.  Patient and/or legal guardian expressed understanding and consented to Telemedicine visit: {YES/NO:21197}  Presenting Concerns: Patient and/or family reports the following symptoms/concerns: *** Duration of problem: ***; Severity of problem: {Mild/Moderate/Severe:20260}  Patient and/or Family's Strengths/Protective Factors: {CHL AMB BH PROTECTIVE FACTORS:[phone]}  Goals Addressed: Patient will:  Reduce symptoms of: {IBH Symptoms:21014056}   Increase knowledge and/or ability of: {IBH Patient Tools:21014057}   Demonstrate ability to: {IBH Goals:21014053}  Progress towards Goals: {CHL AMB BH PROGRESS TOWARDS GOALS:[phone]}  Interventions: Interventions utilized:  {IBH  Interventions:21014054} Standardized Assessments completed: {IBH Screening Tools:21014051}  Patient and/or Family Response: ***  Assessment: Patient currently experiencing ***.   Patient may benefit from ***.  Plan: Follow up with behavioral health clinician on : *** Behavioral recommendations: *** Referral(s): {IBH Referrals:21014055}  I discussed the assessment and treatment plan with the patient and/or parent/guardian. They were provided an opportunity to ask questions and all were answered. They agreed with the plan and demonstrated an understanding of the instructions.   They were advised to call back or seek an in-person evaluation if the symptoms worsen or if the condition fails to improve as anticipated.  Caroleen Hamman Huong Luthi, LCSW

## 2021-08-13 ENCOUNTER — Institutional Professional Consult (permissible substitution): Payer: Medicaid Other

## 2021-08-13 ENCOUNTER — Ambulatory Visit (INDEPENDENT_AMBULATORY_CARE_PROVIDER_SITE_OTHER): Payer: Medicaid Other | Admitting: Clinical

## 2021-08-13 ENCOUNTER — Ambulatory Visit (INDEPENDENT_AMBULATORY_CARE_PROVIDER_SITE_OTHER): Payer: Medicaid Other

## 2021-08-13 ENCOUNTER — Other Ambulatory Visit: Payer: Self-pay

## 2021-08-13 VITALS — BP 142/88 | HR 85 | Wt 254.9 lb

## 2021-08-13 DIAGNOSIS — Z7189 Other specified counseling: Secondary | ICD-10-CM

## 2021-08-13 DIAGNOSIS — Z658 Other specified problems related to psychosocial circumstances: Secondary | ICD-10-CM

## 2021-08-13 DIAGNOSIS — Z0130 Encounter for examination of blood pressure without abnormal findings: Secondary | ICD-10-CM

## 2021-08-13 NOTE — Progress Notes (Signed)
Pt reports today for BP check s/p starting Nifedipine 60 mg po daily for cHTN.  Pt denies visual disturbances however has occasional mild headache.  Pt reports that she was not aware of the need for taking Nifedipine and has not started taking it.  BP LA 143/86 via dynamap.  Rpt BP check 142/88 manually RA.   Pt encouraged to start taking the Nifedipine 60 mg asap and to come back on Friday morning for rpt BP.  Pt informed me that she has transportation issues.  I explained to the pt that [facility] will deliver to her home.  Pt stated that she will call [facility] to have them deliver it and make sure they have an updated address.  Reviewed chart with Dione Plover, MD who agrees that pt needs to start taking the BP medication and return on Friday for BP check.    Frances Nickels  08/13/21

## 2021-08-13 NOTE — Patient Instructions (Addendum)
Center for [facility] at [facility] for Women [place], [place] [address] [phone] (main office) [phone] ([place] office)  New mom support groups: www.conehealthybaby.com www.postpartum.net   Armed forces training and education officer  (Childcare options, Early childcare development, etc.) PopTick.no   Housing Resources                    [facility] (serves [place], [place], [place], [place], [place], [place], [place], [place], [place], [place], [place], [place], and [place] counties) [address] [place], [place] [address] [phone] http://dawson-may.com/  **Rental assistance, Home Rehabilitation,Weatherization Assistance Program, Forensic psychologist, Housing Voucher Program  [employer] for Housing and [employer] Company Studies: Chief Financial Officer Resources to residents of [place], [place], and [place] Make sure you have your documents ready, including:  (Household income verification: 2 months pay stubs, unemployment/social security award letter, statement of no income for all household members over 23) Photo ID for all household members over 18 Utility Bill/Rent Ledger/Lease: must show past due amount for utilities/rent, or the rental agreement if rent is current 2. Start your application online or by paper (in [place] or [place]) at:     http://boyd-[email or web address].org/  3. Once you have completed the online application, you will get an email confirmation message from the county. Expect to hear back by phone or email at least 6-10 weeks from submitting your application.  4. While you wait:  Call [phone] to check in on your application Let your landlord know that you've applied. Your landlord will be asked to submit documents (W-9) during this application process. Payments will be made directly to the landlord/property  [place] or utility assistance for [employer], [place], and [place] at https://rb.gy/dvxbfv Questions? Call or email Renee at [phone] or [email or web address]   Eviction Mediation Program: The HOPE Program Https://www.rebuild.http://mills-williams.net/ HOPE Progam serves low-income renters in [place] counties, defined as less than or equal to 80% of the area median income for the county where the renter lives. In the following 12 counties, you should apply to your local rent and utility assistance program INSTEAD OF the HOPE Program: [place], [place], [place], [place], [place], [place], [place], [place], [place], [place], [place]  If you live outside of [place], contact [place] call center at [phone] to talk to a Program Representative Monday-Friday, 8am-5pm Note that Native American tribes also received federal funding for rent and utility assistance programs. Recognized members of the following tribes will be served by programs managed by tribal governments, including: [place] Indians, [place], [place], [place] of [place] and [place], [place], [phone] [email or web address] .[employer], [place], [phone] [email or web address]'@[email or web address] .[place] [address] [place], [place] [address] [phone] www.gha-Rule.[facility] [address] [place], [place] [address] [phone] https://manning.com/ **Programs include: Engineer, building services and Housing Counseling, Healthy Doctor, general practice, Homeless Prevention and [place] [address], [place], [place], [place] [address] [phone] www.https://www.farmer-stevens.info/ **housing applications/recertification; tax  payment relief/exemption under specific qualifications  [facility] [address], [place], [place] [address] www.onlinegreensboro.com/~maryshouse **transitional housing for women in recovery who have minor children or are pregnant  [place], [place], [place] [address] ArtistMovie.se  **emergency shelter and support services for families facing homelessness  [place] [address], [place], [place]  [address] [phone] www.youthfocus.org **transitional housing to pregnant women; emergency housing for youth who have run away, are experiencing a family crisis, are victims of abuse or neglect, or are homeless  [facility] [address] [place], [place] [address] [phone] ircgso.org **Drop-in center for people experiencing homelessness; overnight warming center when temperature is 25 degrees or below  [facility] [address] [place], [place] [address] [phone] www.greensborourbanministry.org  **emergency and transitional housing, rent/mortgage assistance, utility assistance  [facility] [address] [place], [place] [address] [phone] www.salvationarmyofgreensboro.org **emergency and transitional housing  [employer] for [employer] [place], [place], [place] [address] [phone] Www.habitatgreensboro.[place], [place], [place] [address] [phone] https://chshousing.org **[place] and [facility]  [employer]ousing [employer]ants Group [address] [place] [address], [place], [place] [address] [phone] arrivance.com **home buyer education courses, foreclosure prevention  [place], [place], [place] [address] [phone] WirelessNovelties.no **Environmental  Exposure Assessment (investigation of homes where either children or pregnant women with a confirmed elevated blood lead level reside)  [facility] [address] [place], [place] [address] [phone] http://www.perez.com/ **Home Expense Assistance/Repairs Program; offers home accessibility updates, such as ramps or bars in the bathroom  [facility] [address], [place], [place] [address] [phone] https://www.self-help.org/locations/[place]-branch **Offers credit-building and banking services to people unable to use traditional banking  Transportation Resources [place], [place], [place] [address] https://www.[place].gov/departments/transportation/gdot-divisions/[place]-transit-agency-public-transportation-division     Fixed-route bus services, including regional fare cards for PART, [place], [place], and WSTA buses.  Reduced fare bus ID's available for Medicaid, Medicare, and "orange card" recipients.  SCAT offers curb-to-curb and door-to-door bus services for people with disabilities who are unable to use a fixed-bus route; also offers a shared-ride program.   Helpful tips:  -Routes available online and physical maps available at the main bus hub lobby (each for a specific route) -Smartphone directions often include bus routes (see the "bus" icon, next to the "car" and "walk" icons) -Routes differ on weekends, evenings and holidays, so plan ahead!  -If you have Medicaid, Medicare, or orange card, plan to obtain a reduced-fare ID to save 50% on rides. Check days and times to obtain an ID, and bring all necessary documents.   [employer] [place]) [place], [place]. [address] [place], [place]  [address] [phone] http://www.smith-bell.org/ **Fixed-bus route services, and demand response bus service for older adults  Department of Social [facility] [address], [place], [place] [address] [phone] www.ProfileWatcher.fi **Medicaid transportation is available to [facility] recipients who need assistance getting to [facility] medical appointments and providers  [facility] [address] [place] [address], [place], [place] [address] www.cjmedicaltransportation.com  ** Offers non-emergency transportation for medical appointments  [employer] [place], [place], [place] [address] [phone] www.wheels4hope.org **REFERRAL NEEDED by specific agencies (see website), after meeting specified criteria only  [employer] for [employer]) [address], [place], [place] [address] [phone]  BikerFestival.is  *Regional fixed-bus routes between counties (example: [place] to [place]) and [employer] of [place], [place], [place] [address] [phone] Http://senior-resources-guilford.org  [employer] available (call or see website for details)
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
## 2021-08-14 ENCOUNTER — Encounter: Payer: Self-pay | Admitting: Obstetrics and Gynecology

## 2021-08-16 ENCOUNTER — Encounter: Payer: Medicaid Other | Admitting: Family

## 2021-08-16 ENCOUNTER — Inpatient Hospital Stay (HOSPITAL_COMMUNITY): Payer: Medicaid Other

## 2021-08-16 ENCOUNTER — Other Ambulatory Visit: Payer: Self-pay

## 2021-08-16 ENCOUNTER — Ambulatory Visit (INDEPENDENT_AMBULATORY_CARE_PROVIDER_SITE_OTHER): Payer: Medicaid Other

## 2021-08-16 ENCOUNTER — Encounter (HOSPITAL_COMMUNITY): Payer: Self-pay | Admitting: Obstetrics and Gynecology

## 2021-08-16 ENCOUNTER — Inpatient Hospital Stay (HOSPITAL_COMMUNITY)
Admission: AD | Admit: 2021-08-16 | Discharge: 2021-08-16 | Disposition: A | Discharge status: Home / Self Care | Payer: Medicaid Other | Attending: Obstetrics and Gynecology | Admitting: Obstetrics and Gynecology

## 2021-08-16 VITALS — BP 127/99 | HR 92 | Ht 62.0 in | Wt 251.5 lb

## 2021-08-16 DIAGNOSIS — R930 Abnormal findings on diagnostic imaging of skull and head, not elsewhere classified: Secondary | ICD-10-CM | POA: Insufficient documentation

## 2021-08-16 DIAGNOSIS — M25562 Pain in left knee: Secondary | ICD-10-CM

## 2021-08-16 DIAGNOSIS — R079 Chest pain, unspecified: Secondary | ICD-10-CM | POA: Diagnosis not present

## 2021-08-16 DIAGNOSIS — O99893 Other specified diseases and conditions complicating puerperium: Secondary | ICD-10-CM

## 2021-08-16 DIAGNOSIS — M79662 Pain in left lower leg: Secondary | ICD-10-CM | POA: Diagnosis not present

## 2021-08-16 DIAGNOSIS — R109 Unspecified abdominal pain: Secondary | ICD-10-CM | POA: Diagnosis not present

## 2021-08-16 DIAGNOSIS — O1093 Unspecified pre-existing hypertension complicating the puerperium: Secondary | ICD-10-CM | POA: Diagnosis not present

## 2021-08-16 DIAGNOSIS — Z8616 Personal history of COVID-19: Secondary | ICD-10-CM | POA: Diagnosis not present

## 2021-08-16 DIAGNOSIS — O9089 Other complications of the puerperium, not elsewhere classified: Secondary | ICD-10-CM | POA: Insufficient documentation

## 2021-08-16 DIAGNOSIS — Z888 Allergy status to other drugs, medicaments and biological substances status: Secondary | ICD-10-CM | POA: Insufficient documentation

## 2021-08-16 DIAGNOSIS — R519 Headache, unspecified: Secondary | ICD-10-CM | POA: Insufficient documentation

## 2021-08-16 DIAGNOSIS — N644 Mastodynia: Secondary | ICD-10-CM | POA: Diagnosis not present

## 2021-08-16 DIAGNOSIS — I10 Essential (primary) hypertension: Secondary | ICD-10-CM

## 2021-08-16 LAB — COMPREHENSIVE METABOLIC PANEL
ALT: 12 U/L (ref 0–44)
AST: 14 U/L — ABNORMAL LOW (ref 15–41)
Albumin: 3.3 g/dL — ABNORMAL LOW (ref 3.5–5.0)
Alkaline Phosphatase: 83 U/L (ref 38–126)
Anion gap: 10 (ref 5–15)
BUN: 13 mg/dL (ref 6–20)
CO2: 28 mmol/L (ref 22–32)
Calcium: 9 mg/dL (ref 8.9–10.3)
Chloride: 102 mmol/L (ref 98–111)
Creatinine, Ser: 0.59 mg/dL (ref 0.44–1.00)
GFR, Estimated: >60 mL/min (ref >60)
Glucose, Bld: 85 mg/dL (ref 70–99)
Potassium: 3.9 mmol/L (ref 3.5–5.1)
Sodium: 140 mmol/L (ref 135–145)
Total Bilirubin: 0.7 mg/dL (ref 0.3–1.2)
Total Protein: 7.1 g/dL (ref 6.5–8.1)

## 2021-08-16 LAB — CBC
HCT: 36.6 % (ref 36.0–46.0)
Hemoglobin: 11.6 g/dL — ABNORMAL LOW (ref 12.0–15.0)
MCH: 30.2 pg (ref 26.0–34.0)
MCHC: 31.7 g/dL (ref 30.0–36.0)
MCV: 95.3 fL (ref 80.0–100.0)
Platelets: 485 10*3/uL — ABNORMAL HIGH (ref 150–400)
RBC: 3.84 MIL/uL — ABNORMAL LOW (ref 3.87–5.11)
RDW: 14.6 % (ref 11.5–15.5)
WBC: 8.1 10*3/uL (ref 4.0–10.5)
nRBC: 0 % (ref 0.0–0.2)

## 2021-08-16 MED ORDER — KETOROLAC TROMETHAMINE 10 MG PO TABS: 10.0000 mg | ORAL_TABLET | Freq: Four times a day (QID) | ORAL | 0 refills | Status: DC | PRN

## 2021-08-16 MED ORDER — DEXAMETHASONE SODIUM PHOSPHATE 10 MG/ML IJ SOLN
10.0000 mg | Freq: Once | INTRAMUSCULAR | Status: AC
  Administered 2021-08-16: 10 mg via INTRAVENOUS
  Filled 2021-08-16: qty 1

## 2021-08-16 MED ORDER — IOHEXOL 350 MG/ML SOLN
70.0000 mL | Freq: Once | INTRAVENOUS | Status: AC | PRN
  Administered 2021-08-16: 70 mL via INTRAVENOUS

## 2021-08-16 MED ORDER — LACTATED RINGERS IV SOLN: Freq: Once | INTRAVENOUS | Status: AC

## 2021-08-16 MED ORDER — PROCHLORPERAZINE EDISYLATE 10 MG/2ML IJ SOLN
10.0000 mg | Freq: Four times a day (QID) | INTRAMUSCULAR | Status: DC | PRN
  Administered 2021-08-16: 10 mg via INTRAVENOUS
  Filled 2021-08-16 (×2): qty 2

## 2021-08-16 MED ORDER — KETOROLAC TROMETHAMINE 30 MG/ML IJ SOLN
30.0000 mg | Freq: Once | INTRAMUSCULAR | Status: AC
  Administered 2021-08-16: 30 mg via INTRAVENOUS
  Filled 2021-08-16: qty 1

## 2021-08-16 MED ORDER — NIFEDIPINE ER 60 MG PO TB24: 60.0000 mg | ORAL_TABLET | Freq: Every day | ORAL | 3 refills | Status: DC

## 2021-08-16 MED ORDER — OXYCODONE HCL 5 MG PO TABS
10.0000 mg | ORAL_TABLET | Freq: Once | ORAL | Status: AC
  Administered 2021-08-16: 10 mg via ORAL
  Filled 2021-08-16: qty 2

## 2021-08-16 NOTE — Progress Notes (Signed)
Pt here today for PP BP check. Pt was seen 3 days ago for a PP BP check, but was not taking BP meds as directed. Today, pt states was taking BP meds since 3 days of Procardia '60mg'$  daily. Pt still states having pounding headache today and is not resolved with OTC meds.   Oxycodone '36mg'$  took 10pm last night, headache resolved.  Took BP med this morning at 8am. Has been taking as directed for 3 days. Pt states still having headaches that rates 10 on pain scale. Has been drinking water and eating normally since delivery.   BP: 127/99  SVD 08/04/21  Reviewed BP and symptoms with Dr Damita Dunnings. Pt advised to go to MAU for evaluation. Pt agreeable and verbalized understanding. Dr Damita Dunnings called MAU for report.   Colletta [place], RN

## 2021-08-16 NOTE — MAU Note (Signed)
Vag delivery 9/4.  Head hurts, body hurts (low back and abd)  states been going on since had the baby. Advil and Tylenol not helping, only thing that has helped is the oxycodone she took last night.  Is on BP medication, took it this morning. Pain also in left calf

## 2021-08-16 NOTE — MAU Provider Note (Addendum)
History     CSN: [id number]  Arrival date and time: 08/16/21 1051   Event Date/Time   First Provider Initiated Contact with Patient 08/16/21 1132      Chief Complaint  Patient presents with   Abdominal Pain   Headache   HPI [name] [name] is a 35 y.o. [id number] postpartum patient. She is s/p vaginal delivery on 08/04/2021. She presents to MAU from [facility] for evaluation of 10/10 headache secondary to Chronic Hypertension, managed with Procardia 60 XL daily. On arrival to MAU patient continues to report 10/10 headache pain and verbalizes to triage RN and CNM she "feels like I'm having an aneurysm". Patient has not taken pain medication today but states she took a friend's '30mg'$  of Oxicodone last night and her headache resolved. She states she has read that breast milk can leech into the brain and she communicates to CNM that she wonders if this is the source of her pain.  Left "calf" pain Patient also c/o of pain at her left calf. However, when asked to point to the locus of pain she indicates her left knee. This is a recurrent problem. She states her pain radiates to the back of her knee. She endorses intermittent "tightness" while walking. On CNM assessment she denies calf pain, unilateral swelling, SOB, chest pain, weakness, syncope.  Breast Pain Patient endorses history of small hard lumps in both breasts within the past few days. She is exclusively breastfeeding. She states the lumps resolved without intervention and are not present today. She denies discoloration, tenderness, foul nipple discharge, fever.  Abdominal Pain Patient reports generalized lower abdominal pain, onset with vaginal birth. She denies abdominal tenderness, heavy vaginal bleeding.   Patient receives care with [facility].  OB History     Gravida  8   Para  8   Term  7   Preterm  1   AB  0   Living  9      SAB  0   IAB  0   Ectopic  0   Multiple  1   Live Births  9           Past Medical History:   Diagnosis Date   Abnormal Pap smear    f/u was normal   Chronic hypertension    COVID-19 10/21/2019   10/16/19   DEPRESSION, MAJOR, RECURRENT, MODERATE 04/18/2010   Qualifier: Diagnosis of  By: Georgina Snell MD, Darnelle Spangle)    Herpes simplex virus (HSV) infection    History of abnormal Pap smear 07/28/2012   History of preterm delivery 11/04/2018   Infection    trich, chlamydia, gonorrhea   Ovarian cyst    PTSD (post-traumatic stress disorder)    Right atrial mass    Urinary tract infection     Past Surgical History:  Procedure Laterality Date   CESAREAN SECTION     CESAREAN SECTION N/A 09/23/2018   Procedure: CESAREAN SECTION;  Surgeon: Mora Bellman, MD;  Location: [place];  Service: Obstetrics;  Laterality: N/A;   CHOLECYSTECTOMY N/A 05/10/2020   Procedure: LAPAROSCOPIC CHOLECYSTECTOMY;  Surgeon: Ralene Ok, MD;  Location: [place];  Service: General;  Laterality: N/A;   TEE WITHOUT CARDIOVERSION N/A 12/05/2019   Procedure: TRANSESOPHAGEAL ECHOCARDIOGRAM (TEE);  Surgeon: Donato Heinz, MD;  Location: [facility] ENDOSCOPY;  Service: Endoscopy;  Laterality: N/A;   TONSILLECTOMY     WISDOM TOOTH EXTRACTION      Family History  Problem Relation Age of Onset  Hypertension Mother    Arthritis Mother    Hypertension Father    Asthma Brother    Cancer Maternal Uncle        lung   Diabetes Maternal Grandmother    Cancer Maternal Grandmother        thyroid   Hypertension Maternal Grandmother    Other Neg Hx     Social History   Tobacco Use   Smoking status: Never   Smokeless tobacco: Never  Vaping Use   Vaping Use: Never used  Substance Use Topics   Alcohol use: Not Currently    Alcohol/week: 2.0 standard drinks    Types: 1 Glasses of wine, 1 Shots of liquor per week    Comment: Not currently. Last use May 2020   Drug use: Not Currently    Types: Marijuana    Comment: Not currently. Last use 2017    Allergies:  Allergies  Allergen  Reactions   Benadryl [Diphenhydramine] Itching    Pt reports being itchy after receiving benadryl through IV    Medications Prior to Admission  Medication Sig Dispense Refill Last Dose   NIFEdipine (ADALAT CC) 60 MG 24 hr tablet Take 1 tablet (60 mg total) by mouth daily. 30 tablet 1 08/16/2021 at 0800    Review of Systems  Respiratory:  Negative for shortness of breath.   Cardiovascular:  Negative for palpitations.  Gastrointestinal:  Positive for abdominal pain.  Neurological:  Positive for headaches. Negative for weakness.  All other systems reviewed and are negative. Physical Exam   Blood pressure 130/75, pulse 89, temperature 97.9 F (36.6 C), temperature source Oral, resp. rate 18, SpO2 96 %, currently breastfeeding.  Physical Exam Vitals and nursing note reviewed. Exam conducted with a chaperone present.  Constitutional:      Appearance: She is well-developed. She is obese.  Cardiovascular:     Rate and Rhythm: Normal rate and regular rhythm.     Heart sounds: Normal heart sounds.  Pulmonary:     Effort: Pulmonary effort is normal.     Breath sounds: Normal breath sounds.  Abdominal:     General: Bowel sounds are normal.     Palpations: Abdomen is soft.     Tenderness: There is no abdominal tenderness. There is no right CVA tenderness or left CVA tenderness.  Skin:    General: Skin is warm and dry.  Neurological:     Mental Status: She is alert and oriented to person, place, and time.  Psychiatric:        Mood and Affect: Mood normal.        Behavior: Behavior normal.    MAU Course/MDM  Procedures  Orders Placed This Encounter  Procedures   CBC   Comprehensive metabolic panel   ED EKG   Blood draw with IV start   Insert peripheral IV   --1315. Returned to bedside. Patient drowsy, reports pain score now 2-3/10 (initially 10/10) --1320: ROS, interventions and response discussed with Dr. Gala Romney, who advises Head CT w/o contrast --Patient repeatedly  removing her BP cuff   --1645: CT results discussed with Dr. Ilda Basset, who advised consult with Neurology.   --Case discussed with Dr. Theda Sers, who advises MRA. Order placed  --2200: patient requested additional rx for Roxicodone. Discussed with patient that opioids are not indicated at 8days postpartum. Will prescribe short course Toradol. D/C Motrin  Patient Vitals for the past 24 hrs:  BP Temp Temp src Pulse Resp SpO2  08/16/21 2108 (!) 144/81 -- -- -- -- --  08/16/21 2104 (!) 156/92 -- -- -- -- --  08/16/21 2042 (!) 138/95 -- -- -- -- --  08/16/21 1850 (!) 150/97 98 F (36.7 C) Oral -- 20 99 %  08/16/21 1446 (!) 143/91 -- -- (!) 113 -- --  08/16/21 1431 (!) 153/72 -- -- (!) 104 -- --  08/16/21 1416 (!) 148/71 -- -- (!) 105 -- --  08/16/21 1406 (!) 152/88 -- -- (!) 107 18 --  08/16/21 1318 136/74 -- -- 99 -- --  08/16/21 1156 133/77 -- -- 93 19 99 %  08/16/21 1110 130/75 97.9 F (36.6 C) Oral 89 18 96 %   Results for orders placed or performed during the hospital encounter of 08/16/21 (from the past 24 hour(s))  CBC     Status: Abnormal   Collection Time: 08/16/21 12:22 PM  Result Value Ref Range   WBC 8.1 4.0 - 10.5 K/uL   RBC 3.84 (L) 3.87 - 5.11 MIL/uL   Hemoglobin 11.6 (L) 12.0 - 15.0 g/dL   HCT 36.6 36.0 - 46.0 %   MCV 95.3 80.0 - 100.0 fL   MCH 30.2 26.0 - 34.0 pg   MCHC 31.7 30.0 - 36.0 g/dL   RDW 14.6 11.5 - 15.5 %   Platelets 485 (H) 150 - 400 K/uL   nRBC 0.0 0.0 - 0.2 %  Comprehensive metabolic panel     Status: Abnormal   Collection Time: 08/16/21 12:22 PM  Result Value Ref Range   Sodium 140 135 - 145 mmol/L   Potassium 3.9 3.5 - 5.1 mmol/L   Chloride 102 98 - 111 mmol/L   CO2 28 22 - 32 mmol/L   Glucose, Bld 85 70 - 99 mg/dL   BUN 13 6 - 20 mg/dL   Creatinine, Ser 0.59 0.44 - 1.00 mg/dL   Calcium 9.0 8.9 - 10.3 mg/dL   Total Protein 7.1 6.5 - 8.1 g/dL   Albumin 3.3 (L) 3.5 - 5.0 g/dL   AST 14 (L) 15 - 41 U/L   ALT 12 0 - 44 U/L   Alkaline  Phosphatase 83 38 - 126 U/L   Total Bilirubin 0.7 0.3 - 1.2 mg/dL   GFR, Estimated >60 >60 mL/min   Anion gap 10 5 - 15   CT ANGIO HEAD W OR WO CONTRAST  Result Date: 08/16/2021 CLINICAL DATA:  Severe headache EXAM: CT ANGIOGRAPHY HEAD TECHNIQUE: Multidetector CT imaging of the head was performed using the standard protocol during bolus administration of intravenous contrast. Multiplanar CT image reconstructions and MIPs were obtained to evaluate the vascular anatomy. CONTRAST:  45m OMNIPAQUE IOHEXOL 350 MG/ML SOLN COMPARISON:  Brain MRI 03/05/2014, CT head 06/28/2017 FINDINGS: CT HEAD Brain: There is no acute intracranial hemorrhage, extra-axial fluid collection, or infarct. The ventricles are normal in size. There is no mass lesion. There is no midline shift. Vascular: See below Skull: Normal. Negative for fracture or focal lesion. Sinuses: The imaged paranasal sinuses are clear. The globes and orbits are unremarkable. Other: None. CTA HEAD Anterior circulation: The bilateral cavernous ICAs are patent. There is multifocal irregularity of the bilateral MCAs and ACAs. There is no high-grade stenosis or occlusion. No aneurysm is identified. Posterior circulation: The V4 segments of the vertebral arteries are patent. There is mild multifocal irregularity of the bilateral PCAs, to a lesser degree than the anterior circulation. No aneurysm is identified. Venous sinuses: Patent. Anatomic variants: NONE. Review of the MIP images confirms the above findings. IMPRESSION: 1. No acute intracranial hemorrhage or  infarct. 2. Apparent multifocal irregularity of the intracranial vasculature as above may be due to poor image quality; however, this finding can be seen with RCVS, vasospasm, or vasculitis. Recommend MRI/MRA of the head to evaluate for true pathology. 3. No aneurysm identified. Electronically Signed   By: Valetta Mole M.D.   On: 08/16/2021 16:41   MR ANGIO HEAD WO CONTRAST  Result Date: 08/16/2021 CLINICAL  DATA:  Headache, chronic, new features or increased frequency; abnormal CTA EXAM: MRA HEAD WITHOUT CONTRAST TECHNIQUE: Angiographic images of the Circle of Willis were acquired using MRA technique without intravenous contrast. COMPARISON:  Correlation made with prior CTA FINDINGS: Intracranial internal carotid arteries are patent. Middle and anterior cerebral arteries are patent. Partially included intracranial vertebral arteries, basilar artery, and posterior cerebral arteries are patent. Right posterior communicating artery is identified. There is no significant stenosis or aneurysm. IMPRESSION: Normal MRA of the head. Electronically Signed   By: Macy Mis M.D.   On: 08/16/2021 20:53    Meds ordered this encounter  Medications   dexamethasone (DECADRON) injection 10 mg   prochlorperazine (COMPAZINE) injection 10 mg   lactated ringers infusion   ketorolac (TORADOL) 30 MG/ML injection 30 mg   oxyCODONE (Oxy IR/ROXICODONE) immediate release tablet 10 mg   iohexol (OMNIPAQUE) 350 MG/ML injection 70 mL   NIFEdipine (ADALAT CC) 60 MG 24 hr tablet    Sig: Take 1 tablet (60 mg total) by mouth daily.    Dispense:  30 tablet    Refill:  3    Order Specific Question:   Supervising Provider    Answer:   Aletha Halim [id number]    Assessment and Plan  --35 y.o. [phone] s/p vaginal delivery 08/04/2021 --CHTN on Procardia 60 XL daily --Headache resolved with IV analgesia --S/p consult with Neurology, s/p normal MRA --No acute findings on physical exam --Patient denies pain at time of discharge --Discharge home in stable condition  F/U: --Given concern for patient compliance with Procardia 60 XL, will message [facility] to schedule BP check early next week  Darlina Rumpf, CNM 08/16/2021, 10:05 PM

## 2021-08-17 ENCOUNTER — Telehealth (HOSPITAL_COMMUNITY): Payer: Self-pay
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
## 2021-08-20 ENCOUNTER — Telehealth: Payer: Self-pay | Admitting: Clinical

## 2021-08-20 NOTE — Telephone Encounter (Signed)
Attempt to f/u with pt; Left HIPPA-compliant message to call back Roselyn Reef from [employer] for [employer] at [facility] at  [phone] [facility] office).

## 2021-08-22 ENCOUNTER — Telehealth: Payer: Self-pay | Admitting: Clinical

## 2021-08-22 NOTE — Telephone Encounter (Signed)
Attempt to f/u w patient; Left HIPPA-compliant message to call back Roselyn Reef from [employer] for [employer] at [facility] at  [phone] [facility] office).

## 2021-08-26 ENCOUNTER — Encounter (HOSPITAL_COMMUNITY): Payer: Self-pay | Admitting: Physician Assistant

## 2021-08-26 ENCOUNTER — Ambulatory Visit (HOSPITAL_COMMUNITY): Payer: Medicaid Other | Attending: Cardiology

## 2021-08-29 ENCOUNTER — Ambulatory Visit (HOSPITAL_BASED_OUTPATIENT_CLINIC_OR_DEPARTMENT_OTHER): Payer: Self-pay | Admitting: Cardiovascular Disease

## 2021-09-02 NOTE — Telephone Encounter (Signed)
error 

## 2021-09-04 ENCOUNTER — Telehealth (HOSPITAL_BASED_OUTPATIENT_CLINIC_OR_DEPARTMENT_OTHER): Payer: Self-pay | Admitting: Cardiovascular Disease

## 2021-09-04 ENCOUNTER — Encounter (HOSPITAL_BASED_OUTPATIENT_CLINIC_OR_DEPARTMENT_OTHER): Payer: Self-pay

## 2021-09-04 NOTE — Telephone Encounter (Signed)
LM for patient to call and discuss rescheduling the Echo and follow up with Dr. Oval Linsey

## 2021-09-09 ENCOUNTER — Telehealth (HOSPITAL_COMMUNITY): Payer: Self-pay | Admitting: Physician Assistant

## 2021-09-09 NOTE — Telephone Encounter (Signed)
Just an FYI. We have made several attempts to contact this patient including sending a letter to schedule or reschedule their echocardiogram. We will be removing the patient from the echo [place].  08/26/21 NO SHOWED- MAILED LETTER [facility]     Thank you

## 2021-09-11 ENCOUNTER — Ambulatory Visit: Payer: Medicaid Other | Admitting: Nurse Practitioner

## 2021-09-25 ENCOUNTER — Telehealth: Payer: Self-pay

## 2021-09-25 NOTE — Progress Notes (Deleted)
[address]       [place] [address]             [phone]        [name] [name] Medical Record #[id number] Date of Birth: 03/06/1986  Referring: Donato Heinz* Primary Care: Ezequiel Essex, MD Primary Cardiologist:Tiffany Oval Linsey, MD  Chief Complaint:   No chief complaint on file.   History of Present Illness:     ***   Past Medical and Surgical History: Previous Chest Surgery: *** Previous Chest Radiation: *** Diabetes Mellitus: ***.  HbA1C *** Creatinine: ***  Past Medical History:  Diagnosis Date   Abnormal Pap smear    f/u was normal   Chronic hypertension    COVID-19 10/21/2019   10/16/19   DEPRESSION, MAJOR, RECURRENT, MODERATE 04/18/2010   Qualifier: Diagnosis of  By: Georgina Snell MD, Darnelle Spangle)    Herpes simplex virus (HSV) infection    History of abnormal Pap smear 07/28/2012   History of preterm delivery 11/04/2018   Infection    trich, chlamydia, gonorrhea   Ovarian cyst    PTSD (post-traumatic stress disorder)    Right atrial mass    Urinary tract infection     Past Surgical History:  Procedure Laterality Date   CESAREAN SECTION     CESAREAN SECTION N/A 09/23/2018   Procedure: CESAREAN SECTION;  Surgeon: Mora Bellman, MD;  Location: [place];  Service: Obstetrics;  Laterality: N/A;   CHOLECYSTECTOMY N/A 05/10/2020   Procedure: LAPAROSCOPIC CHOLECYSTECTOMY;  Surgeon: Ralene Ok, MD;  Location: [place];  Service: General;  Laterality: N/A;   TEE WITHOUT CARDIOVERSION N/A 12/05/2019   Procedure: TRANSESOPHAGEAL ECHOCARDIOGRAM (TEE);  Surgeon: Donato Heinz, MD;  Location: [facility] ENDOSCOPY;  Service: Endoscopy;  Laterality: N/A;   TONSILLECTOMY     WISDOM TOOTH EXTRACTION      Social History: Support: ***  Social History   Tobacco Use  Smoking Status Never  Smokeless Tobacco Never    Social History   Substance and Sexual Activity  Alcohol Use Not Currently    Alcohol/week: 2.0 standard drinks   Types: 1 Glasses of wine, 1 Shots of liquor per week   Comment: Not currently. Last use May 2020     Allergies  Allergen Reactions   Benadryl [Diphenhydramine] Itching    Pt reports being itchy after receiving benadryl through IV    Medications: Asprin: *** Statin: *** Beta Blocker: *** Ace Inhibitor: *** Anti-Coagulation: ***  Current Outpatient Medications  Medication Sig Dispense Refill   ketorolac (TORADOL) 10 MG tablet Take 1 tablet (10 mg total) by mouth every 6 (six) hours as needed. 20 tablet 0   NIFEdipine (ADALAT CC) 60 MG 24 hr tablet Take 1 tablet (60 mg total) by mouth daily. 30 tablet 3   No current facility-administered medications for this visit.   Facility-Administered Medications Ordered in Other Visits  Medication Dose Route Frequency Provider Last Rate Last Admin   bupivacaine (PF) (MARCAINE) 0.25 % injection   Epidural Anesthesia Intra-op Barnet Glasgow, MD   8 mL at 08/03/21 2341   fentaNYL (SUBLIMAZE) injection   Epidural Anesthesia Intra-op Barnet Glasgow, MD   100 mcg at 08/03/21 2341   fentaNYL 2 mcg/mL w/ bupivacaine 0.125% in NS 250 mL epidural infusion   Epidural Continuous PRN Barnet Glasgow, MD 12 mL/hr at 08/03/21 2243 12 mL/hr at 08/03/21 2243   lidocaine-EPINEPHrine (XYLOCAINE W/EPI) 2 %-1:200000 (PF)  injection   Epidural Anesthesia Intra-op Barnet Glasgow, MD   3 mL at 08/03/21 2239    (Not in a hospital admission)   Family History  Problem Relation Age of Onset   Hypertension Mother    Arthritis Mother    Hypertension Father    Asthma Brother    Cancer Maternal Uncle        lung   Diabetes Maternal Grandmother    Cancer Maternal Grandmother        thyroid   Hypertension Maternal Grandmother    Other Neg Hx      Review of Systems:   ROS    Physical Exam: There were no vitals taken for this visit. Physical Exam    Diagnostic Studies & Laboratory data:    Left Heart  Catherization: *** Echo: *** EKG: *** I have independently reviewed the above radiologic studies and discussed with the patient   Recent Lab Findings: Lab Results  Component Value Date   WBC 8.1 08/16/2021   HGB 11.6 (L) 08/16/2021   HCT 36.6 08/16/2021   PLT 485 (H) 08/16/2021   GLUCOSE 85 08/16/2021   ALT 12 08/16/2021   AST 14 (L) 08/16/2021   NA 140 08/16/2021   K 3.9 08/16/2021   CL 102 08/16/2021   CREATININE 0.59 08/16/2021   BUN 13 08/16/2021   CO2 28 08/16/2021   TSH 0.645 06/01/2019   INR 1.3 (H) 08/04/2021   HGBA1C 5.1 07/15/2021      Assessment / Plan:   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  Patient will need repeat echo versus cardiac MRI.  Would consider angio vac versus sternotomy.     I  spent {CHL ONC TIME VISIT - RAXEN:[id number]} counseling the patient face to face.   [name] [name] 09/25/2021 3:05 PM

## 2021-09-25 NOTE — Telephone Encounter (Signed)
Received notification from [place], pregnancy navigator, that she was speaking with the pt and was informed that the pt was having vaginal bleeding after intercourse and having abdominal pain.  Left message on VM that I am calling in regards to her concerns if she could please give the office a call back if she continues to have concerns.  Mel Almond, RN  09/25/21

## 2021-09-27 ENCOUNTER — Encounter: Payer: Medicaid Other | Admitting: Thoracic Surgery (Cardiothoracic Vascular Surgery)

## 2021-12-24 ENCOUNTER — Emergency Department (HOSPITAL_COMMUNITY): Payer: Medicaid Other

## 2021-12-24 ENCOUNTER — Emergency Department (HOSPITAL_COMMUNITY)
Admission: EM | Admit: 2021-12-24 | Discharge: 2021-12-24 | Disposition: A | Discharge status: Home / Self Care | Payer: Medicaid Other | Attending: Emergency Medicine | Admitting: Emergency Medicine

## 2021-12-24 DIAGNOSIS — R0789 Other chest pain: Secondary | ICD-10-CM | POA: Diagnosis not present

## 2021-12-24 DIAGNOSIS — Z5321 Procedure and treatment not carried out due to patient leaving prior to being seen by health care provider: Secondary | ICD-10-CM | POA: Diagnosis not present

## 2021-12-24 DIAGNOSIS — N898 Other specified noninflammatory disorders of vagina: Secondary | ICD-10-CM | POA: Diagnosis not present

## 2021-12-24 DIAGNOSIS — R1030 Lower abdominal pain, unspecified: Secondary | ICD-10-CM | POA: Insufficient documentation

## 2021-12-24 DIAGNOSIS — R197 Diarrhea, unspecified: Secondary | ICD-10-CM | POA: Insufficient documentation

## 2021-12-24 LAB — URINALYSIS, ROUTINE W REFLEX MICROSCOPIC
Bilirubin Urine: NEGATIVE
Glucose, UA: NEGATIVE mg/dL
Ketones, ur: NEGATIVE mg/dL
Nitrite: NEGATIVE
Protein, ur: NEGATIVE mg/dL
Specific Gravity, Urine: 1.017 (ref 1.005–1.030)
pH: 5 (ref 5.0–8.0)

## 2021-12-24 LAB — COMPREHENSIVE METABOLIC PANEL
ALT: 43 U/L (ref 0–44)
AST: 32 U/L (ref 15–41)
Albumin: 3.5 g/dL (ref 3.5–5.0)
Alkaline Phosphatase: 54 U/L (ref 38–126)
Anion gap: 11 (ref 5–15)
BUN: 7 mg/dL (ref 6–20)
CO2: 24 mmol/L (ref 22–32)
Calcium: 8.8 mg/dL — ABNORMAL LOW (ref 8.9–10.3)
Chloride: 105 mmol/L (ref 98–111)
Creatinine, Ser: 0.7 mg/dL (ref 0.44–1.00)
GFR, Estimated: >60 mL/min (ref >60)
Glucose, Bld: 91 mg/dL (ref 70–99)
Potassium: 3.8 mmol/L (ref 3.5–5.1)
Sodium: 140 mmol/L (ref 135–145)
Total Bilirubin: 0.4 mg/dL (ref 0.3–1.2)
Total Protein: 7 g/dL (ref 6.5–8.1)

## 2021-12-24 LAB — CBC
HCT: 36.3 % (ref 36.0–46.0)
Hemoglobin: 11.7 g/dL — ABNORMAL LOW (ref 12.0–15.0)
MCH: 30.7 pg (ref 26.0–34.0)
MCHC: 32.2 g/dL (ref 30.0–36.0)
MCV: 95.3 fL (ref 80.0–100.0)
Platelets: 371 10*3/uL (ref 150–400)
RBC: 3.81 MIL/uL — ABNORMAL LOW (ref 3.87–5.11)
RDW: 12.7 % (ref 11.5–15.5)
WBC: 9.1 10*3/uL (ref 4.0–10.5)
nRBC: 0 % (ref 0.0–0.2)

## 2021-12-24 LAB — TROPONIN I (HIGH SENSITIVITY): Troponin I (High Sensitivity): 3 ng/L (ref <18)

## 2021-12-24 LAB — LIPASE, BLOOD: Lipase: 37 U/L (ref 11–51)

## 2021-12-24 LAB — I-STAT BETA HCG BLOOD, ED (MC, WL, AP ONLY): I-stat hCG, quantitative: <5 m[IU]/mL (ref <5)

## 2021-12-24 NOTE — ED Notes (Signed)
Pt told staff that she is leaving and will come back on a day that she does not have work

## 2021-12-24 NOTE — ED Provider Triage Note (Signed)
Emergency Medicine Provider Triage Evaluation Note  [name] [name] , a 36 y.o. female  was evaluated in triage.  Pt complains of diarrhea, lower abdominal pain, increased vaginal discharge x2 days. No urinary symptoms. No vomiting.  She also admits to intermittent chest pain for the past few months. No SOB, nausea, or diaphoresis with pain.  Review of Systems  Positive: CP, diarrhea, abdominal pain Negative: fever  Physical Exam  BP (!) 147/97 (BP Location: Left Arm)    Pulse 82    Temp 98.9 F (37.2 C) (Oral)    Resp 17    LMP 10/29/2021    SpO2 99%  Gen:   Awake, no distress   Resp:  Normal effort  MSK:   Moves extremities without difficulty  Other:    Medical Decision Making  Medically screening exam initiated at 4:11 PM.  Appropriate orders placed.  [name] [name] was informed that the remainder of the evaluation will be completed by another provider, this initial triage assessment does not replace that evaluation, and the importance of remaining in the ED until their evaluation is complete.  Abdominal labs ordered in triage Added cardiac labs   [name] [name], [place] 12/24/21 1613

## 2021-12-24 NOTE — ED Triage Notes (Addendum)
PT. STATED, IVE HAD DIARRHEA and lower stomach pain for 2 days.and I might be pregnant. I already have 9 children

## 2023-04-24 IMAGING — US US FETAL BPP W/ NON-STRESS
1 series · 13 of 14 positions shown · non-contrast
Comparison: none

[Series 1: us fetal bpp w/ non-stress · 14 acquisitions, 13 frames shown]
[im 1/14]
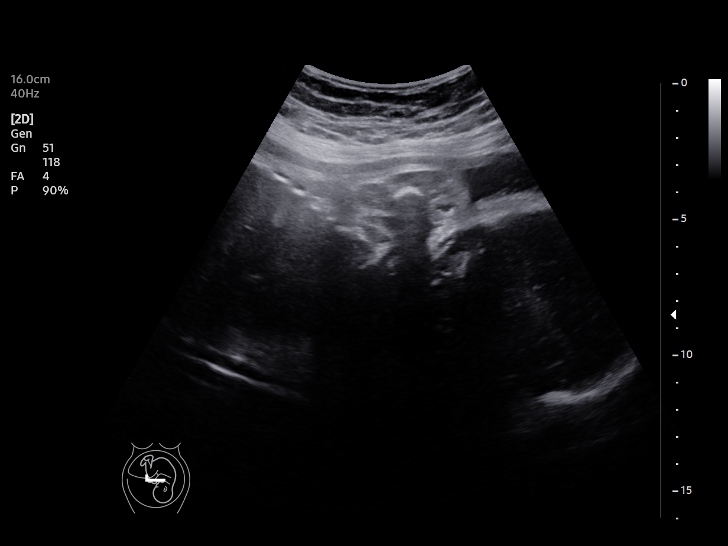
[im 2/14]
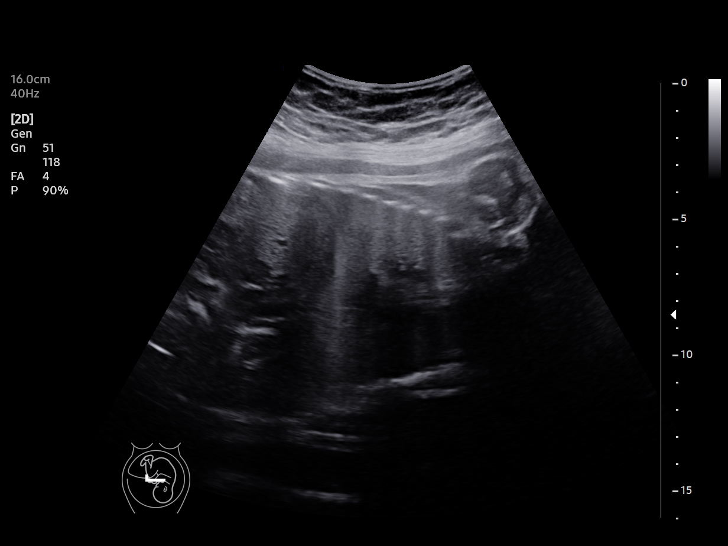
[im 3/14]
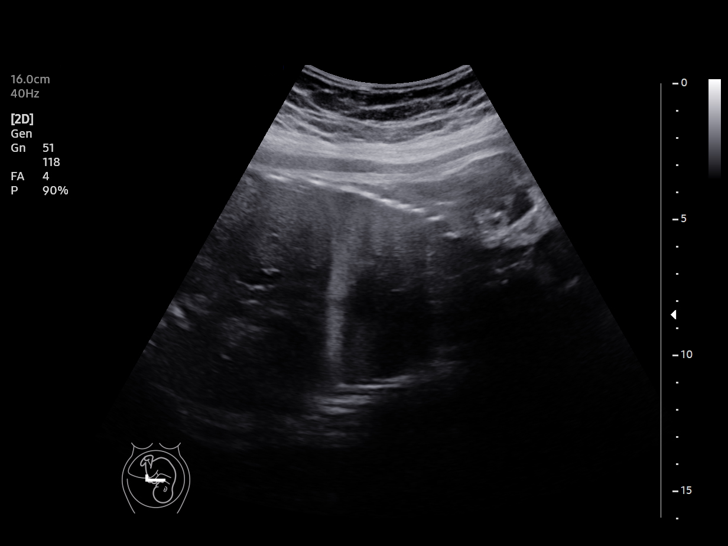
[im 4/14]
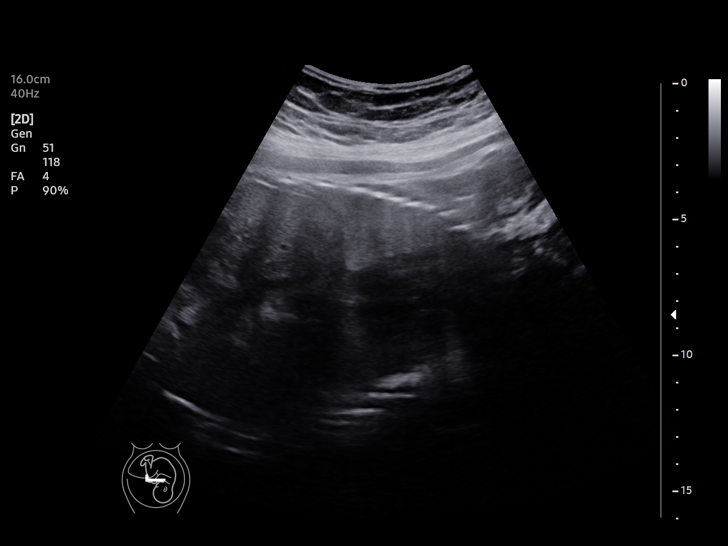
[im 5/14]
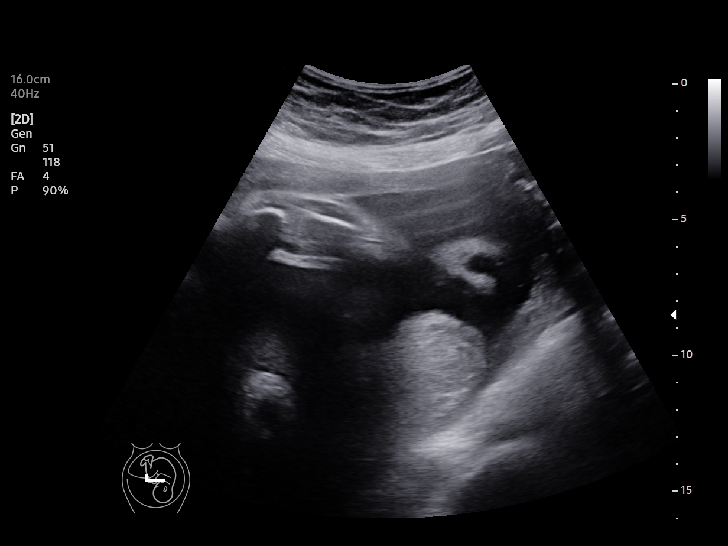
[im 6/14]
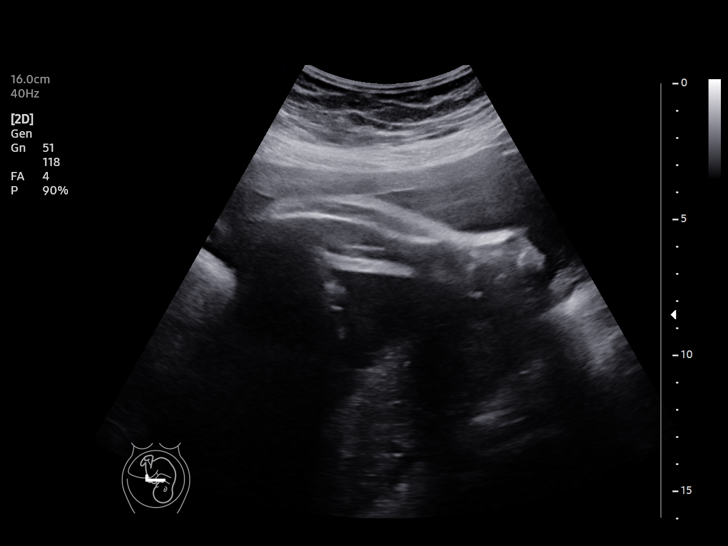
[im 8/14]
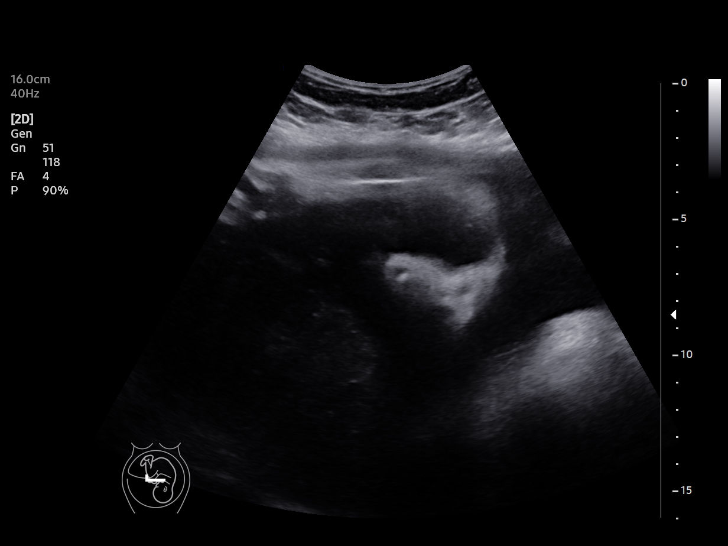
[im 9/14]
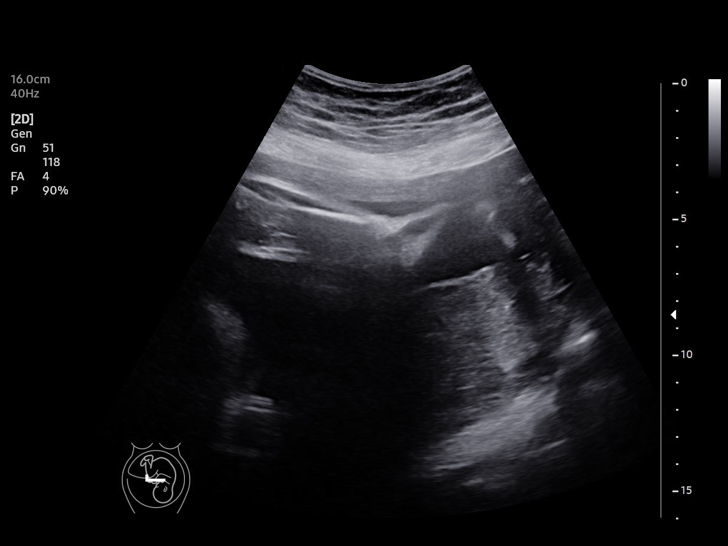
[im 10/14]
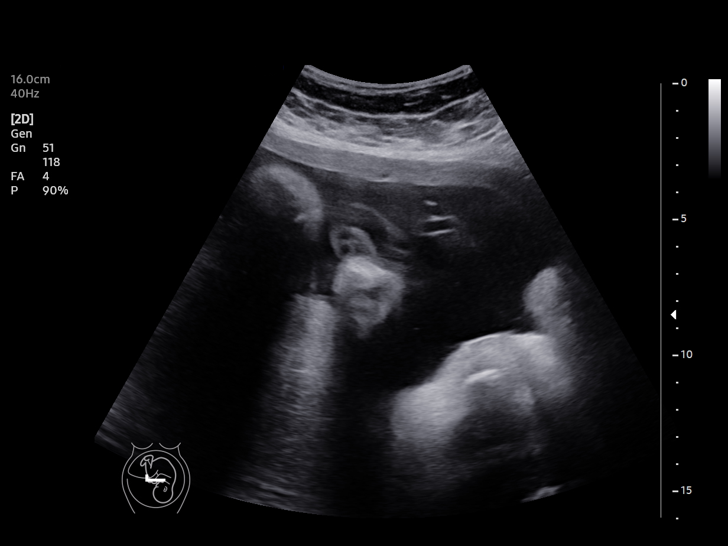
[im 11/14]
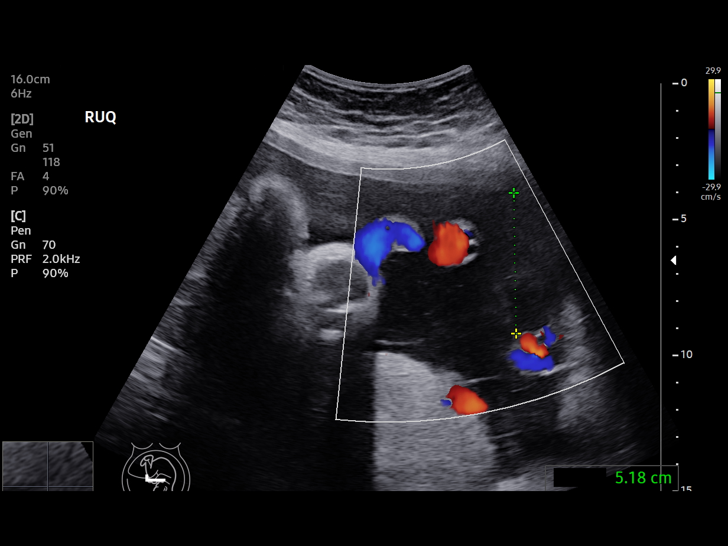
[im 12/14]
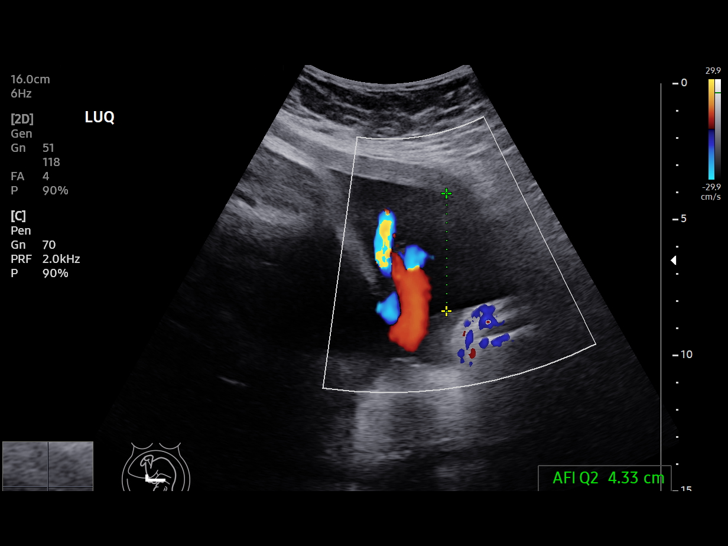
[im 13/14]
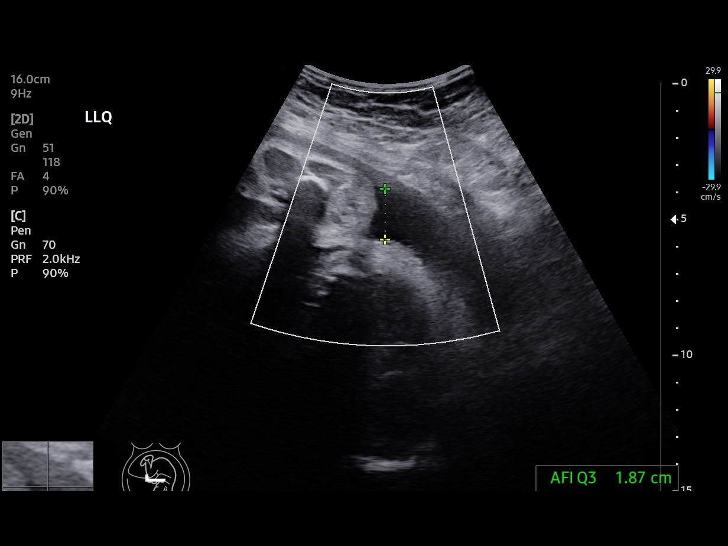
[im 14/14]
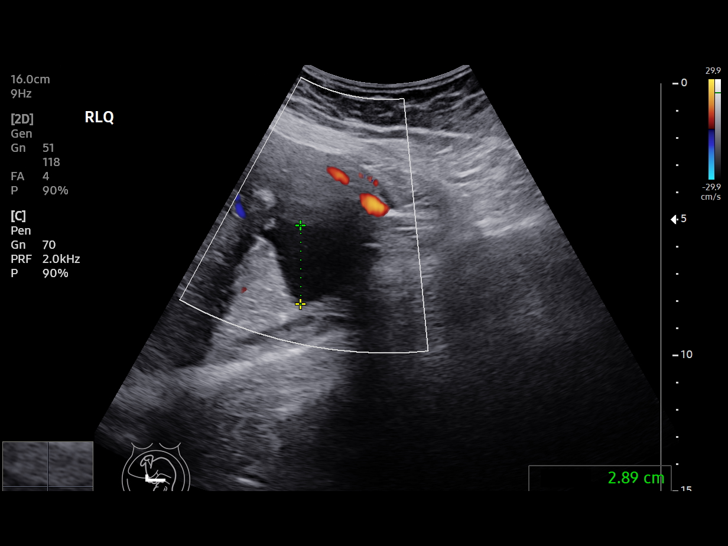

[13 of 14 positions shown; findings below may reference images not displayed]

[REDACTED]care at

 1  US FETAL BPP W/NONSTRESS              76818.4     FITSUMBIRHAN COROPO

Service(s) Provided

Indications

 36 weeks gestation of pregnancy
 Hypertension - Chronic/Pre-existing
Fetal Evaluation

 Num Of Fetuses:         1
 Preg. Location:         Intrauterine
 Cardiac Activity:       Observed
 Presentation:           Cephalic

 Amniotic Fluid
 AFI FV:      Within normal limits

 AFI Sum(cm)     %Tile       Largest Pocket(cm)
 14.3            52

 RUQ(cm)       RLQ(cm)       LUQ(cm)        LLQ(cm)

Biophysical Evaluation

 Amniotic F.V:   Pocket => 2 cm             F. Tone:        Observed
 F. Movement:    Observed                   N.S.T:          Reactive
 F. Breathing:   Observed                   Score:          [DATE]
OB History

 Gravidity:    8         Term:   6        Prem:   1        SAB:   0
 TOP:          0       Ectopic:  0        Living: 8
Gestational Age

 LMP:           19w 3d        Date:  03/01/21                 EDD:   12/06/21
 Best:          36w 1d     Det. By:  U/S  (03/09/21)          EDD:   08/11/21
Impression

 36 week gestation with [DATE] BPP reactive NST
Recommendations

 Continue weekly antenatal testing till delivery .

## 2023-06-08 ENCOUNTER — Inpatient Hospital Stay (HOSPITAL_COMMUNITY)
Admission: AD | Admit: 2023-06-08 | Discharge: 2023-06-08 | Disposition: A | Discharge status: Home / Self Care | Payer: 59 | Attending: Obstetrics and Gynecology | Admitting: Obstetrics and Gynecology

## 2023-06-08 DIAGNOSIS — A5901 Trichomonal vulvovaginitis: Secondary | ICD-10-CM | POA: Diagnosis not present

## 2023-06-08 DIAGNOSIS — Z3202 Encounter for pregnancy test, result negative: Secondary | ICD-10-CM | POA: Diagnosis not present

## 2023-06-08 DIAGNOSIS — N3000 Acute cystitis without hematuria: Secondary | ICD-10-CM | POA: Diagnosis not present

## 2023-06-08 LAB — WET PREP, GENITAL
Clue Cells Wet Prep HPF POC: NONE SEEN
Sperm: NONE SEEN
WBC, Wet Prep HPF POC: >=10 — AB (ref <10)
Yeast Wet Prep HPF POC: NONE SEEN

## 2023-06-08 LAB — URINALYSIS, ROUTINE W REFLEX MICROSCOPIC
Bilirubin Urine: NEGATIVE
Glucose, UA: NEGATIVE mg/dL
Ketones, ur: NEGATIVE mg/dL
Nitrite: NEGATIVE
Protein, ur: NEGATIVE mg/dL
Specific Gravity, Urine: 1.026 (ref 1.005–1.030)
WBC, UA: >50 WBC/hpf (ref 0–5)
pH: 5 (ref 5.0–8.0)

## 2023-06-08 LAB — HCG, QUANTITATIVE, PREGNANCY: hCG, Beta Chain, Quant, S: <1 m[IU]/mL (ref <5)

## 2023-06-08 MED ORDER — METRONIDAZOLE 500 MG PO TABS: 500.0000 mg | ORAL_TABLET | Freq: Two times a day (BID) | ORAL | 0 refills | Status: DC

## 2023-06-08 MED ORDER — CEFADROXIL 500 MG PO CAPS: 500.0000 mg | ORAL_CAPSULE | Freq: Two times a day (BID) | ORAL | 0 refills | Status: AC

## 2023-06-08 MED ORDER — FLUCONAZOLE 150 MG PO TABS: 150.0000 mg | ORAL_TABLET | Freq: Once | ORAL | 0 refills | Status: AC

## 2023-06-08 NOTE — MAU Note (Signed)
.  [name] [name] is a 37 y.o. at Unknown here in MAU reporting: had a positive HPT  3-4 weeks ago. Reports she has has some vag odor and some cramping an. Thinks she may have a bacteria infection. LMP: Beginning of May Onset of complaint: 3-4 Pain score: 3 Vitals:   06/08/23 0738  BP: (!) 147/94  Pulse: 97  Resp: 16  Temp: 98.4 F (36.9 C)     FHT:n/a Lab orders placed from triage:  upt,

## 2023-06-08 NOTE — MAU Provider Note (Signed)
MAU note     S [name] is a 37 y.o. [id number] patient who presents to MAU today with complaint of possible pregnancy, pelvic cramping, abnormal discharge.   O BP (!) 147/94   Pulse 97   Temp 98.4 F (36.9 C)   Resp 16   LMP 04/03/2023 (Within Weeks)  Physical Exam Vitals reviewed.  Constitutional:      Appearance: She is well-developed.  Skin:    General: Skin is warm and dry.  Neurological:     Mental Status: She is alert.    Results for orders placed or performed during the hospital encounter of 06/08/23 (from the past 24 hour(s))  Urinalysis, Routine w reflex microscopic -Urine, Clean Catch     Status: Abnormal   Collection Time: 06/08/23  7:45 AM  Result Value Ref Range   Color, Urine YELLOW YELLOW   APPearance HAZY (A) CLEAR   Specific Gravity, Urine 1.026 1.005 - 1.030   pH 5.0 5.0 - 8.0   Glucose, UA NEGATIVE NEGATIVE mg/dL   Hgb urine dipstick SMALL (A) NEGATIVE   Bilirubin Urine NEGATIVE NEGATIVE   Ketones, ur NEGATIVE NEGATIVE mg/dL   Protein, ur NEGATIVE NEGATIVE mg/dL   Nitrite NEGATIVE NEGATIVE   Leukocytes,Ua LARGE (A) NEGATIVE   RBC / HPF 11-20 0 - 5 RBC/hpf   WBC, UA >50 0 - 5 WBC/hpf   Bacteria, UA FEW (A) NONE SEEN   Squamous Epithelial / HPF 6-10 0 - 5 /HPF   Mucus PRESENT   Wet prep, genital     Status: Abnormal   Collection Time: 06/08/23  7:45 AM   Specimen: PATH Cytology Cervicovaginal Ancillary Only  Result Value Ref Range   Yeast Wet Prep HPF POC NONE SEEN NONE SEEN   Trich, Wet Prep PRESENT (A) NONE SEEN   Clue Cells Wet Prep HPF POC NONE SEEN NONE SEEN   WBC, Wet Prep HPF POC >=10 (A) <10   Sperm NONE SEEN   hCG, quantitative, pregnancy     Status: None   Collection Time: 06/08/23  8:16 AM  Result Value Ref Range   hCG, Beta Chain, Quant, S <1 <5 mIU/mL     A 1. Trichomonal vaginitis   2. Acute cystitis without hematuria    P Discharge from MAU in stable condition Patient not pregnant.  Will treat the patient for UTI  and trichomonas. Duricef x 3 days and metronidazole x 7 days prescribed.  Recommended partner treatment.  No sex for 2 weeks. Warning signs for worsening condition that would warrant emergency follow-up discussed Patient may return to MAU as needed   Levie Heritage, DO 06/08/2023 10:05 AM

## 2023-06-09 LAB — GC/CHLAMYDIA PROBE AMP (~~LOC~~) NOT AT ARMC
Chlamydia: NEGATIVE
Comment: NEGATIVE
Comment: NORMAL
Neisseria Gonorrhea: NEGATIVE

## 2023-10-05 ENCOUNTER — Emergency Department (HOSPITAL_COMMUNITY)
Admission: EM | Admit: 2023-10-05 | Discharge: 2023-10-05 | Disposition: A | Discharge status: Home / Self Care | Payer: 59 | Attending: Emergency Medicine | Admitting: Emergency Medicine

## 2023-10-05 ENCOUNTER — Emergency Department (HOSPITAL_COMMUNITY): Payer: 59

## 2023-10-05 DIAGNOSIS — M25572 Pain in left ankle and joints of left foot: Secondary | ICD-10-CM | POA: Insufficient documentation

## 2023-10-05 DIAGNOSIS — M25571 Pain in right ankle and joints of right foot: Secondary | ICD-10-CM | POA: Insufficient documentation

## 2023-10-05 DIAGNOSIS — I10 Essential (primary) hypertension: Secondary | ICD-10-CM | POA: Diagnosis not present

## 2023-10-05 NOTE — ED Provider Notes (Signed)
[facility] AT [facility] Provider Note   CSN: [id number] Arrival date & time: 10/05/23  5638     History  Chief Complaint  Patient presents with   Ankle Pain    [name] [name] is a 37 y.o. female past medical history significant for chronic hypertension presents today for rolling both of her ankles on Thursday.  Patient states that the pain is greater in her right ankle than her left.  She endorses swelling and pain.  Patient is still able to walk.  Patient states she has tried resting and icing her ankles which has had minimal improvement.   Ankle Pain      Home Medications Prior to Admission medications   Medication Sig Start Date End Date Taking? Authorizing Provider  metroNIDAZOLE (FLAGYL) 500 MG tablet Take 1 tablet (500 mg total) by mouth 2 (two) times daily. 06/08/23   Levie Heritage, DO      Allergies    Benadryl [diphenhydramine]    Review of Systems   Review of Systems  Musculoskeletal:  Positive for arthralgias.    Physical Exam Updated Vital Signs BP (!) 148/97 (BP Location: Right Arm)   Pulse 79   Temp 98.5 F (36.9 C)   Resp 17   Ht 5\' 2"  (1.575 m)   SpO2 100%   BMI 46.00 kg/m  Physical Exam Vitals and nursing note reviewed.  Constitutional:      General: She is not in acute distress.    Appearance: She is well-developed.  HENT:     Head: Normocephalic and atraumatic.     Right Ear: External ear normal.     Left Ear: External ear normal.     Nose: Nose normal.  Eyes:     Conjunctiva/sclera: Conjunctivae normal.  Cardiovascular:     Rate and Rhythm: Normal rate and regular rhythm.     Heart sounds: No murmur heard. Pulmonary:     Effort: Pulmonary effort is normal. No respiratory distress.     Breath sounds: Normal breath sounds.  Abdominal:     Palpations: Abdomen is soft.     Tenderness: There is no abdominal tenderness.  Musculoskeletal:        General: Swelling and tenderness present.     Cervical  back: Neck supple.     Comments: Mild swelling to bilateral ankles with right greater than left.  Patient has tenderness to both posterior lateral malleoli.  Neurovascularly intact.  Skin:    General: Skin is warm and dry.     Capillary Refill: Capillary refill takes less than 2 seconds.  Neurological:     Mental Status: She is alert.  Psychiatric:        Mood and Affect: Mood normal.     ED Results / Procedures / Treatments   Labs (all labs ordered are listed, but only abnormal results are displayed) Labs Reviewed - No data to display  EKG None  Radiology DG Ankle Complete Left  Result Date: 10/05/2023 CLINICAL DATA:  Bilateral ankle pain and swelling after fall. EXAM: LEFT ANKLE COMPLETE - 3+ VIEW COMPARISON:  None Available. FINDINGS: There is no evidence of fracture, dislocation, or joint effusion. The ankle mortise is preserved. There is no evidence of arthropathy or other focal bone abnormality. Small plantar calcaneal spur and Achilles tendon enthesophyte. Mild soft tissue edema. IMPRESSION: Mild soft tissue edema. No fracture or subluxation of the left ankle. Electronically Signed   By: Narda Rutherford M.D.   On: 10/05/2023  10:20   DG Ankle Complete Right  Result Date: 10/05/2023 CLINICAL DATA:  Bilateral ankle pain and swelling after fall. EXAM: RIGHT ANKLE - COMPLETE 3+ VIEW COMPARISON:  None Available. FINDINGS: There is no evidence of fracture, dislocation, or joint effusion. The ankle mortise is preserved. There is no evidence of arthropathy or other focal bone abnormality. Mild soft tissue edema. IMPRESSION: Mild soft tissue edema. No fracture or subluxation of the right ankle. Electronically Signed   By: Narda Rutherford M.D.   On: 10/05/2023 10:19    Procedures Procedures    Medications Ordered in ED Medications - No data to display  ED Course/ Medical Decision Making/ A&P                                 Medical Decision Making Amount and/or Complexity of  Data Reviewed Radiology: ordered.   This patient presents to the ED with chief complaint(s) of ankle pain with pertinent past medical history of chronic hypertension which further complicates the presenting complaint. The complaint involves an extensive differential diagnosis and also carries with it a high risk of complications and morbidity.    The differential diagnosis includes fibula fracture, tibia fracture, musculoskeletal pain  ED Course and Reassessment: Ace wrap to both ankles for support  Independent visualization of imaging: - I independently visualized the following imaging with scope of interpretation limited to determining acute life threatening conditions related to emergency care: Left ankle x-ray, which revealed mild soft tissue edema, no fracture or subluxation and right ankle x-ray which revealed mild soft tissue edema, no fracture or subluxation.  Consultation: - Consulted or discussed management/test interpretation w/ external professional: None  Consideration for admission or further workup: Patient's physical exam and imaging were reassuring.  Patient is stable for discharge.  If patient continues to have symptoms patient should follow-up with PCP for further evaluation.        Final Clinical Impression(s) / ED Diagnoses Final diagnoses:  Acute bilateral ankle pain    Rx / DC Orders ED Discharge Orders     None         Dolphus Jenny, PA-C 10/05/23 1034    Margarita Grizzle, MD 10/05/23 [phone]

## 2023-10-05 NOTE — ED Triage Notes (Signed)
Patient reports fall on Thursday causing her to hurt both ankles. Pain in R > L and continues to have pain and swelling. Ambulatory.

## 2023-10-05 NOTE — Discharge Instructions (Signed)
Today you were seen for ankle pain in both ankles.  Please review the following attached instructions.  Follow-up with your PCP if symptoms persist.  Thank you for letting us treat you today. After performing a physical exam reviewing your imaging, I feel you are safe to go home. Please follow up with your PCP in the next several days and provide them with your records from this visit. Return to the Emergency Room if pain becomes severe or symptoms worsen.

## 2023-10-05 NOTE — ED Notes (Signed)
Awaiting patient from lobby 

## 2023-10-05 NOTE — ED Notes (Signed)
Ace wrap applied to both ankles by this RN

## 2024-03-11 ENCOUNTER — Inpatient Hospital Stay (HOSPITAL_COMMUNITY): Payer: MEDICAID

## 2024-03-11 ENCOUNTER — Encounter (HOSPITAL_COMMUNITY): Payer: Self-pay | Admitting: *Deleted

## 2024-03-11 ENCOUNTER — Inpatient Hospital Stay (HOSPITAL_COMMUNITY)
Admission: AD | Admit: 2024-03-11 | Discharge: 2024-03-12 | Disposition: A | Discharge status: Home / Self Care | Payer: MEDICAID | Attending: Obstetrics and Gynecology | Admitting: Obstetrics and Gynecology

## 2024-03-11 DIAGNOSIS — Z3A01 Less than 8 weeks gestation of pregnancy: Secondary | ICD-10-CM | POA: Diagnosis not present

## 2024-03-11 DIAGNOSIS — B9689 Other specified bacterial agents as the cause of diseases classified elsewhere: Secondary | ICD-10-CM | POA: Insufficient documentation

## 2024-03-11 DIAGNOSIS — O23591 Infection of other part of genital tract in pregnancy, first trimester: Secondary | ICD-10-CM | POA: Insufficient documentation

## 2024-03-11 DIAGNOSIS — O209 Hemorrhage in early pregnancy, unspecified: Secondary | ICD-10-CM | POA: Diagnosis present

## 2024-03-11 DIAGNOSIS — O200 Threatened abortion: Secondary | ICD-10-CM | POA: Insufficient documentation

## 2024-03-11 LAB — COMPREHENSIVE METABOLIC PANEL WITH GFR
ALT: 15 U/L (ref 0–44)
AST: 18 U/L (ref 15–41)
Albumin: 3.5 g/dL (ref 3.5–5.0)
Alkaline Phosphatase: 51 U/L (ref 38–126)
Anion gap: 9 (ref 5–15)
BUN: 13 mg/dL (ref 6–20)
CO2: 25 mmol/L (ref 22–32)
Calcium: 8.8 mg/dL — ABNORMAL LOW (ref 8.9–10.3)
Chloride: 105 mmol/L (ref 98–111)
Creatinine, Ser: 1.07 mg/dL — ABNORMAL HIGH (ref 0.44–1.00)
GFR, Estimated: >60 mL/min (ref >60)
Glucose, Bld: 100 mg/dL — ABNORMAL HIGH (ref 70–99)
Potassium: 3.8 mmol/L (ref 3.5–5.1)
Sodium: 139 mmol/L (ref 135–145)
Total Bilirubin: 0.4 mg/dL (ref 0.0–1.2)
Total Protein: 7.7 g/dL (ref 6.5–8.1)

## 2024-03-11 LAB — CBC
HCT: 36 % (ref 36.0–46.0)
Hemoglobin: 11.7 g/dL — ABNORMAL LOW (ref 12.0–15.0)
MCH: 30.7 pg (ref 26.0–34.0)
MCHC: 32.5 g/dL (ref 30.0–36.0)
MCV: 94.5 fL (ref 80.0–100.0)
Platelets: 404 K/uL — ABNORMAL HIGH (ref 150–400)
RBC: 3.81 MIL/uL — ABNORMAL LOW (ref 3.87–5.11)
RDW: 13 % (ref 11.5–15.5)
WBC: 12.5 K/uL — ABNORMAL HIGH (ref 4.0–10.5)
nRBC: 0 % (ref 0.0–0.2)

## 2024-03-11 LAB — WET PREP, GENITAL
Sperm: NONE SEEN
Trich, Wet Prep: NONE SEEN
WBC, Wet Prep HPF POC: >=10 — AB
Yeast Wet Prep HPF POC: NONE SEEN

## 2024-03-11 LAB — HCG, QUANTITATIVE, PREGNANCY: hCG, Beta Chain, Quant, S: 209 m[IU]/mL — ABNORMAL HIGH

## 2024-03-11 LAB — POCT PREGNANCY, URINE: Preg Test, Ur: POSITIVE — AB

## 2024-03-11 NOTE — MAU Provider Note (Cosign Needed Addendum)
 History     CSN: [id number]  Arrival date and time: 03/11/24 2046   Event Date/Time   First Provider Initiated Contact with Patient 03/11/24 2206      No chief complaint on file.   [name] [name] is a 38 y.o. [id number] at 4.4 weeks by unsure LMP.  She presents today for vaginal bleeding in setting of positive UPT.  She reports her cycles have been irregular.  She states she has been having cramping "on and off," but today it started about 2100 and has been intermittent.  She rates 4-5/10 and denies relieving or worsening factors. She reports vaginal bleeding that varies in flow and color.  She states today it was orange. She denies issues with urination.  She reports diarrhea but contributes it to not having a gallbladder.    OB History     Gravida  9   Para  8   Term  7   Preterm  1   AB  0   Living  9      SAB  0   IAB  0   Ectopic  0   Multiple  1   Live Births  9           Past Medical History:  Diagnosis Date   Abnormal Pap smear    f/u was normal   Chronic hypertension    COVID-19 10/21/2019   10/16/19   DEPRESSION, MAJOR, RECURRENT, MODERATE 04/18/2010   Qualifier: Diagnosis of  By: Alease Hunter MD, Berle Breeding)    Herpes simplex virus (HSV) infection    History of abnormal Pap smear 07/28/2012   History of preterm delivery 11/04/2018   Infection    trich, chlamydia, gonorrhea   Ovarian cyst    PTSD (post-traumatic stress disorder)    Right atrial mass    Urinary tract infection     Past Surgical History:  Procedure Laterality Date   CESAREAN SECTION     CESAREAN SECTION N/A 09/23/2018   Procedure: CESAREAN SECTION;  Surgeon: Verlyn Goad, MD;  Location: [facility];  Service: Obstetrics;  Laterality: N/A;   CHOLECYSTECTOMY N/A 05/10/2020   Procedure: LAPAROSCOPIC CHOLECYSTECTOMY;  Surgeon: Shela Derby, MD;  Location: [facility] OR;  Service: General;  Laterality: N/A;   TEE WITHOUT CARDIOVERSION N/A 12/05/2019   Procedure:  TRANSESOPHAGEAL ECHOCARDIOGRAM (TEE);  Surgeon: Wendie Hamburg, MD;  Location: [facility] ENDOSCOPY;  Service: Endoscopy;  Laterality: N/A;   TONSILLECTOMY     WISDOM TOOTH EXTRACTION      Family History  Problem Relation Age of Onset   Hypertension Mother    Arthritis Mother    Hypertension Father    Asthma Brother    Cancer Maternal Uncle        lung   Diabetes Maternal Grandmother    Cancer Maternal Grandmother        thyroid   Hypertension Maternal Grandmother    Other Neg Hx     Social History   Tobacco Use   Smoking status: Never   Smokeless tobacco: Never  Vaping Use   Vaping status: Never Used  Substance Use Topics   Alcohol use: Not Currently    Alcohol/week: 2.0 standard drinks of alcohol    Types: 1 Glasses of wine, 1 Shots of liquor per week    Comment: Not currently. Last use May 2020   Drug use: Not Currently    Types: Marijuana    Comment: Not currently. Last use 2017  Allergies:  Allergies  Allergen Reactions   Benadryl [Diphenhydramine] Itching    Pt reports being itchy after receiving benadryl through IV    Medications Prior to Admission  Medication Sig Dispense Refill Last Dose/Taking   metroNIDAZOLE (FLAGYL) 500 MG tablet Take 1 tablet (500 mg total) by mouth 2 (two) times daily. 14 tablet 0     Review of Systems  Gastrointestinal:  Positive for abdominal pain and diarrhea (Contributes to gallbladder removal). Negative for constipation, nausea and vomiting.  Genitourinary:  Positive for vaginal bleeding. Negative for difficulty urinating, dysuria and vaginal discharge.       Reports urine with strong odor.    Physical Exam   Last menstrual period 02/09/2024, currently breastfeeding.  Physical Exam Vitals and nursing note reviewed. Exam conducted with a chaperone present Abran Abrahams, RN).  Constitutional:      Appearance: She is well-developed. She is obese.  HENT:     Head: Normocephalic and atraumatic.  Eyes:     Conjunctiva/sclera:  Conjunctivae normal.  Cardiovascular:     Rate and Rhythm: Normal rate.  Pulmonary:     Effort: Pulmonary effort is normal. No respiratory distress.  Musculoskeletal:     Cervical back: Normal range of motion.  Skin:    General: Skin is warm and dry.  Neurological:     Mental Status: She is alert and oriented to person, place, and time.  Psychiatric:        Mood and Affect: Mood normal.        Behavior: Behavior normal.     MAU Course  Procedures Results for orders placed or performed during the hospital encounter of 03/11/24 (from the past 24 hours)  Pregnancy, urine POC     Status: Abnormal   Collection Time: 03/11/24 10:22 PM  Result Value Ref Range   Preg Test, Ur POSITIVE (A) NEGATIVE  Wet prep, genital     Status: Abnormal   Collection Time: 03/11/24 10:24 PM  Result Value Ref Range   Yeast Wet Prep HPF POC NONE SEEN NONE SEEN   Trich, Wet Prep NONE SEEN NONE SEEN   Clue Cells Wet Prep HPF POC PRESENT (A) NONE SEEN   WBC, Wet Prep HPF POC >=10 (A) <10   Sperm NONE SEEN   CBC     Status: Abnormal   Collection Time: 03/11/24 10:52 PM  Result Value Ref Range   WBC 12.5 (H) 4.0 - 10.5 K/uL   RBC 3.81 (L) 3.87 - 5.11 MIL/uL   Hemoglobin 11.7 (L) 12.0 - 15.0 g/dL   HCT 98.1 19.1 - 47.8 %   MCV 94.5 80.0 - 100.0 fL   MCH 30.7 26.0 - 34.0 pg   MCHC 32.5 30.0 - 36.0 g/dL   RDW 29.5 62.1 - 30.8 %   Platelets 404 (H) 150 - 400 K/uL   nRBC 0.0 0.0 - 0.2 %  hCG, quantitative, pregnancy     Status: Abnormal   Collection Time: 03/11/24 10:52 PM  Result Value Ref Range   hCG, Beta Chain, Quant, S 209 (H) <5 mIU/mL  Comprehensive metabolic panel with GFR     Status: Abnormal   Collection Time: 03/11/24 10:52 PM  Result Value Ref Range   Sodium 139 135 - 145 mmol/L   Potassium 3.8 3.5 - 5.1 mmol/L   Chloride 105 98 - 111 mmol/L   CO2 25 22 - 32 mmol/L   Glucose, Bld 100 (H) 70 - 99 mg/dL   BUN 13 6 - 20 mg/dL  Creatinine, Ser 1.07 (H) 0.44 - 1.00 mg/dL   Calcium 8.8  (L) 8.9 - 10.3 mg/dL   Total Protein 7.7 6.5 - 8.1 g/dL   Albumin 3.5 3.5 - 5.0 g/dL   AST 18 15 - 41 U/L   ALT 15 0 - 44 U/L   Alkaline Phosphatase 51 38 - 126 U/L   Total Bilirubin 0.4 0.0 - 1.2 mg/dL   GFR, Estimated >30 >16 mL/min   Anion gap 9 5 - 15   US  OB LESS THAN 14 WEEKS WITH OB TRANSVAGINAL Result Date: 03/11/2024 CLINICAL DATA:  Vaginal bleeding. Assigned gestational age [redacted] weeks, 3 days. LMP 02/09/2024. EXAM: OBSTETRIC <14 WK US  AND TRANSVAGINAL OB US  TECHNIQUE: Both transabdominal and transvaginal ultrasound examinations were performed for complete evaluation of the gestation as well as the maternal uterus, adnexal regions, and pelvic cul-de-sac. Transvaginal technique was performed to assess early pregnancy. COMPARISON:  None Available. FINDINGS: Intrauterine gestational sac: None identified Maternal uterus/adnexae: The uterus is anteverted. The cervix is unremarkable. The myometrial echotexture is coarsened diffusely though a discrete intrauterine mass is not clearly identified. The endometrium measures 6 mm in thickness. No intrauterine gestational sac or masses are identified. The maternal ovaries are unremarkable. No free fluid seen within the pelvis. IMPRESSION: Pregnancy location not visualized sonographically. Differential diagnosis includes recent spontaneous abortion, IUP too early to visualize, and non-visualized ectopic pregnancy. Recommend close follow up of quantitative B-HCG levels, and follow up US  as clinically warranted. Electronically Signed   By: Worthy Heads M.D.   On: 03/11/2024 23:44    MDM Physical Exam Cultures: Wet Prep and GC/CT Labs: UA, UPT, CBC, CMP, hCG Ultrasound Assessment and Plan  38 year old [id number] at 4.4 weeks Vaginal Bleeding  -Reviewed POC with patient. -Exam performed.  -Brief discussion regarding blood pressure and importance of management.  -Labs ordered. -Cultures collected by self swab. -Will send for US  and await  results. -Patient offered and declines pain medication.  -Will defer rhogam work up due to early gestational age and current scant bleeding.    Kraig Peru 03/11/2024, 10:06 PM   Reassessment (12:08 AM) -Results as above. -Patient informed of need for repeat labs in 48 hours. -Agreeable to follow up at office.  Scheduled for Monday at 0900. -Precautions reviewed. -Informed of clue cells c/w BV.  Patient reports diagnosis in the past and is familiar with treatment. -Will send metronidazole to pharmacy. -Discharged to home in stable condition.  Kraig Peru MSN, CNM Advanced Practice Provider, Center for [employer]

## 2024-03-11 NOTE — MAU Note (Signed)
 Pt says she had positive UPT  today.  Says has VB- irreg. Cramping- 4/10 - started at 2100-

## 2024-03-12 DIAGNOSIS — Z3A01 Less than 8 weeks gestation of pregnancy: Secondary | ICD-10-CM

## 2024-03-12 DIAGNOSIS — O200 Threatened abortion: Secondary | ICD-10-CM

## 2024-03-12 MED ORDER — METRONIDAZOLE 500 MG PO TABS: 500.0000 mg | ORAL_TABLET | Freq: Two times a day (BID) | ORAL | 0 refills | Status: DC

## 2024-03-14 ENCOUNTER — Ambulatory Visit: Payer: MEDICAID

## 2024-03-14 ENCOUNTER — Telehealth: Payer: Self-pay

## 2024-03-14 LAB — GC/CHLAMYDIA PROBE AMP (~~LOC~~) NOT AT ARMC
Chlamydia: NEGATIVE
Comment: NEGATIVE
Comment: NORMAL
Neisseria Gonorrhea: NEGATIVE

## 2024-03-14 NOTE — Telephone Encounter (Signed)
 Called patient; voicemail left stating I am calling to reschedule missed visit this morning. Requested patient call or message to reschedule.

## 2024-03-15 ENCOUNTER — Telehealth: Payer: Self-pay | Admitting: Family Medicine

## 2024-03-15 NOTE — Telephone Encounter (Signed)
 I reached out to the patient regarding the blood work we need her to get. Patient did not answer. Left her a detailed to message.Patient was unable to make it on 03/14/2024 when she was scheduled. Informed her it is hard for her to come due to transportation issues and hours of scheduling.

## 2024-03-16 ENCOUNTER — Inpatient Hospital Stay (HOSPITAL_COMMUNITY)
Admission: AD | Admit: 2024-03-16 | Discharge: 2024-03-16 | Disposition: A | Discharge status: Home / Self Care | Payer: MEDICAID | Attending: Obstetrics & Gynecology | Admitting: Obstetrics & Gynecology

## 2024-03-16 DIAGNOSIS — O3680X0 Pregnancy with inconclusive fetal viability, not applicable or unspecified: Secondary | ICD-10-CM | POA: Diagnosis not present

## 2024-03-16 DIAGNOSIS — Z3A01 Less than 8 weeks gestation of pregnancy: Secondary | ICD-10-CM | POA: Diagnosis not present

## 2024-03-16 DIAGNOSIS — O209 Hemorrhage in early pregnancy, unspecified: Secondary | ICD-10-CM | POA: Diagnosis present

## 2024-03-16 DIAGNOSIS — R109 Unspecified abdominal pain: Secondary | ICD-10-CM | POA: Diagnosis present

## 2024-03-16 LAB — CBC
HCT: 36.3 % (ref 36.0–46.0)
Hemoglobin: 11.8 g/dL — ABNORMAL LOW (ref 12.0–15.0)
MCH: 31.1 pg (ref 26.0–34.0)
MCHC: 32.5 g/dL (ref 30.0–36.0)
MCV: 95.8 fL (ref 80.0–100.0)
Platelets: 401 10*3/uL — ABNORMAL HIGH (ref 150–400)
RBC: 3.79 MIL/uL — ABNORMAL LOW (ref 3.87–5.11)
RDW: 13 % (ref 11.5–15.5)
WBC: 7.6 10*3/uL (ref 4.0–10.5)
nRBC: 0 % (ref 0.0–0.2)

## 2024-03-16 LAB — HCG, QUANTITATIVE, PREGNANCY: hCG, Beta Chain, Quant, S: 602 m[IU]/mL — ABNORMAL HIGH (ref <5)

## 2024-03-16 NOTE — MAU Note (Signed)
 Here for f/u BHCG. Reports  mild cramping no vag bleeding.   F/U on Friday. Dr. Hubert Madden MSE  pt and gave her results.

## 2024-03-16 NOTE — MAU Provider Note (Signed)
 History     CSN: [id number]  Arrival date and time: 03/16/24 1336   Event Date/Time   First Provider Initiated Contact with Patient    Chief Complaint  Patient presents with   Labs Only    HPI  [name] [name] is a 38 y.o. [id number] at [redacted]w[redacted]d who presents to the MAU for repeat b-hCG. She presented on 4/11 for vaginal bleeding and cramping. Reports VB resolved, cramping is unchanged from prior and not worse. She denies lightheadedness or dizziness.   Past Medical History:  Diagnosis Date   Abnormal Pap smear    f/u was normal   Chronic hypertension    COVID-19 10/21/2019   10/16/19   DEPRESSION, MAJOR, RECURRENT, MODERATE 04/18/2010   Qualifier: Diagnosis of  By: Alease Hunter MD, Berle Breeding)    Herpes simplex virus (HSV) infection    History of abnormal Pap smear 07/28/2012   History of preterm delivery 11/04/2018   Infection    trich, chlamydia, gonorrhea   Ovarian cyst    PTSD (post-traumatic stress disorder)    Right atrial mass    Urinary tract infection     Past Surgical History:  Procedure Laterality Date   CESAREAN SECTION     CESAREAN SECTION N/A 09/23/2018   Procedure: CESAREAN SECTION;  Surgeon: Verlyn Goad, MD;  Location: [facility];  Service: Obstetrics;  Laterality: N/A;   CHOLECYSTECTOMY N/A 05/10/2020   Procedure: LAPAROSCOPIC CHOLECYSTECTOMY;  Surgeon: Shela Derby, MD;  [facility] OR;  Service: General;  Laterality: N/A;   TEE WITHOUT CARDIOVERSION N/A 12/05/2019   Procedure: TRANSESOPHAGEAL ECHOCARDIOGRAM (TEE);  Surgeon: Wendie Hamburg, MD;  Location: [facility] ENDOSCOPY;  Service: Endoscopy;  Laterality: N/A;   TONSILLECTOMY     WISDOM TOOTH EXTRACTION      Family History  Problem Relation Age of Onset   Hypertension Mother    Arthritis Mother    Hypertension Father    Asthma Brother    Cancer Maternal Uncle        lung   Diabetes Maternal Grandmother    Cancer Maternal Grandmother        thyroid   Hypertension  Maternal Grandmother    Other Neg Hx     Social History   Tobacco Use   Smoking status: Never   Smokeless tobacco: Never  Vaping Use   Vaping status: Never Used  Substance Use Topics   Alcohol use: Not Currently    Alcohol/week: 2.0 standard drinks of alcohol    Types: 1 Glasses of wine, 1 Shots of liquor per week    Comment: Not currently. Last use May 2020   Drug use: Not Currently    Types: Marijuana    Comment: Not currently. Last use 2017    Allergies:  Allergies  Allergen Reactions   Benadryl [Diphenhydramine] Itching    Pt reports being itchy after receiving benadryl through IV    No medications prior to admission.    ROS reviewed and pertinent positives and negatives as documented in HPI.  Physical Exam   Blood pressure (!) 141/84, pulse 81, temperature 98.9 F (37.2 C), resp. rate 18, last menstrual period 02/09/2024, currently breastfeeding.  Physical Exam Constitutional:      General: She is not in acute distress.    Appearance: Normal appearance. She is not ill-appearing.  HENT:     Head: Normocephalic and atraumatic.  Cardiovascular:     Rate and Rhythm: Normal rate.  Pulmonary:     Effort: Pulmonary  effort is normal.     Breath sounds: Normal breath sounds.  Abdominal:     Palpations: Abdomen is soft.     Tenderness: There is no abdominal tenderness. There is no guarding.  Musculoskeletal:        General: Normal range of motion.  Skin:    General: Skin is warm and dry.     Findings: No rash.  Neurological:     General: No focal deficit present.     Mental Status: She is alert and oriented to person, place, and time.     MAU Course  Procedures  MDM 38 y.o. [id number] at [redacted]w[redacted]d presenting for repeat b-hCG. Was seen in MAU on 4/11 for VB, cramping and labs notable for Rh negative status (declined RhoGAM), HgB was wnl, wet prep pos for BV, and U/S with PUL. Patient was due to return for repeat quant on 4/14 but was unable to make the appt and was  directed to come to MAU for repeat labs. Today she reports unchanged cramping. hCG today 602. Unable to determine if this is appropriate or inappropriate rise given that we are now approximately 6 days out from prior b-hCG. Discussed results and inconclusive nature with patient in detail and possibility for ectopic preg vs early preg. Patient expressed frustration over the fact that we did not have conclusive results and were unable to give her EDD. I gave patient strict return precautions and scheduled a repeat b-hCG for 03/19/24 in the MAU. Discussed with her that b-hCG would be performed again and would provide more clarity of patient's presentation and pregnancy. All questions answered.  Assessment and Plan     ICD-10-CM   1. Pregnancy of unknown anatomic location  O36.80X0     B-hCG 209 (4/11) > 602 (4/16) HgB ok Return 4/19 for repeat quant   [name], MD OB Fellow, Faculty Practice [facility], Center for [facility]  03/16/2024, 7:44 PM

## 2024-03-19 ENCOUNTER — Inpatient Hospital Stay (HOSPITAL_COMMUNITY): Payer: MEDICAID

## 2024-03-19 ENCOUNTER — Other Ambulatory Visit (HOSPITAL_COMMUNITY): Admit: 2024-03-19 | Payer: MEDICAID

## 2024-03-19 ENCOUNTER — Inpatient Hospital Stay (HOSPITAL_COMMUNITY)
Admission: AD | Admit: 2024-03-19 | Discharge: 2024-03-19 | Disposition: A | Discharge status: Home / Self Care | Payer: MEDICAID | Attending: Obstetrics & Gynecology | Admitting: Obstetrics & Gynecology

## 2024-03-19 ENCOUNTER — Other Ambulatory Visit: Payer: Self-pay

## 2024-03-19 DIAGNOSIS — B9689 Other specified bacterial agents as the cause of diseases classified elsewhere: Secondary | ICD-10-CM | POA: Insufficient documentation

## 2024-03-19 DIAGNOSIS — Z3A01 Less than 8 weeks gestation of pregnancy: Secondary | ICD-10-CM | POA: Diagnosis not present

## 2024-03-19 DIAGNOSIS — O23591 Infection of other part of genital tract in pregnancy, first trimester: Secondary | ICD-10-CM | POA: Insufficient documentation

## 2024-03-19 DIAGNOSIS — O209 Hemorrhage in early pregnancy, unspecified: Secondary | ICD-10-CM

## 2024-03-19 DIAGNOSIS — O4691 Antepartum hemorrhage, unspecified, first trimester: Secondary | ICD-10-CM | POA: Diagnosis not present

## 2024-03-19 DIAGNOSIS — R109 Unspecified abdominal pain: Secondary | ICD-10-CM | POA: Insufficient documentation

## 2024-03-19 DIAGNOSIS — Z6791 Unspecified blood type, Rh negative: Secondary | ICD-10-CM

## 2024-03-19 DIAGNOSIS — O26891 Other specified pregnancy related conditions, first trimester: Secondary | ICD-10-CM | POA: Diagnosis not present

## 2024-03-19 DIAGNOSIS — Z3491 Encounter for supervision of normal pregnancy, unspecified, first trimester: Secondary | ICD-10-CM

## 2024-03-19 LAB — CBC
HCT: 34.7 % — ABNORMAL LOW (ref 36.0–46.0)
Hemoglobin: 11.4 g/dL — ABNORMAL LOW (ref 12.0–15.0)
MCH: 31.1 pg (ref 26.0–34.0)
MCHC: 32.9 g/dL (ref 30.0–36.0)
MCV: 94.6 fL (ref 80.0–100.0)
Platelets: 367 10*3/uL (ref 150–400)
RBC: 3.67 MIL/uL — ABNORMAL LOW (ref 3.87–5.11)
RDW: 13 % (ref 11.5–15.5)
WBC: 7.4 10*3/uL (ref 4.0–10.5)
nRBC: 0 % (ref 0.0–0.2)

## 2024-03-19 LAB — COMPREHENSIVE METABOLIC PANEL WITH GFR
ALT: 14 U/L (ref 0–44)
AST: 17 U/L (ref 15–41)
Albumin: 3.5 g/dL (ref 3.5–5.0)
Alkaline Phosphatase: 43 U/L (ref 38–126)
Anion gap: 10 (ref 5–15)
BUN: 8 mg/dL (ref 6–20)
CO2: 25 mmol/L (ref 22–32)
Calcium: 8.8 mg/dL — ABNORMAL LOW (ref 8.9–10.3)
Chloride: 101 mmol/L (ref 98–111)
Creatinine, Ser: 0.77 mg/dL (ref 0.44–1.00)
GFR, Estimated: >60 mL/min (ref >60)
Glucose, Bld: 88 mg/dL (ref 70–99)
Potassium: 3.3 mmol/L — ABNORMAL LOW (ref 3.5–5.1)
Sodium: 136 mmol/L (ref 135–145)
Total Bilirubin: 0.5 mg/dL (ref 0.0–1.2)
Total Protein: 6.9 g/dL (ref 6.5–8.1)

## 2024-03-19 LAB — WET PREP, GENITAL
Sperm: NONE SEEN
Trich, Wet Prep: NONE SEEN
WBC, Wet Prep HPF POC: >=10 — AB (ref <10)
Yeast Wet Prep HPF POC: NONE SEEN

## 2024-03-19 LAB — HCG, QUANTITATIVE, PREGNANCY: hCG, Beta Chain, Quant, S: 1553 m[IU]/mL — ABNORMAL HIGH (ref <5)

## 2024-03-19 MED ORDER — METRONIDAZOLE 500 MG PO TABS: 500.0000 mg | ORAL_TABLET | Freq: Two times a day (BID) | ORAL | 0 refills | Status: DC

## 2024-03-19 MED ORDER — RHO D IMMUNE GLOBULIN 1500 UNIT/2ML IJ SOSY
300.0000 ug | PREFILLED_SYRINGE | Freq: Once | INTRAMUSCULAR | Status: AC
Start: 2024-03-19 — End: 2024-03-19
  Administered 2024-03-19: 300 ug via INTRAMUSCULAR
  Filled 2024-03-19: qty 2

## 2024-03-19 NOTE — MAU Note (Signed)
.  [name] [name] is a 38 y.o. at [redacted]w[redacted]d here in MAU reporting: Pt reports she is here for her HCG follow up. Patient reports she wiped yesterday and saw blood that was reddish pink that lasted for 1.5 hours. Patient reports abdominal cramping today.  Onset of complaint: yesterday  Pain score: 5  Lab orders placed from triage:   none

## 2024-03-19 NOTE — MAU Provider Note (Signed)
 History    Event Date/Time   First Provider Initiated Contact with Patient 03/19/24 1700       Chief Complaint:  Abdominal Pain, Vaginal Bleeding, and Follow-up   [name] [name] is  38 y.o. [id number] Patient's last menstrual period was 02/09/2024.Aaron Aas Patient is here for follow up of quantitative HCG and ongoing surveillance of pregnancy status.   She is [redacted]w[redacted]d weeks gestation  by LMP.  Also report vaginal discharge with odor.   Since her last visit, the patient is without new complaint.     ROS Abdominal Pain: Mild cramping Vaginal bleeding: spotting.   Passage of clots or tissue: Denies Dizziness: Denies  A NEG  Her previous Quantitative HCG values are:  Latest Reference Range & Units 03/11/24 22:52 03/16/24 13:56  HCG, Beta Chain, Quant, S <5 mIU/mL 209 (H) 602 (H)  (H): Data is abnormally high  Physical Exam   Patient Vitals for the past 24 hrs:  BP Temp Pulse Resp SpO2  03/19/24 1510 (!) 148/87 99 F (37.2 C) (!) 105 12 98 %   Constitutional: Well-nourished female in no apparent distress. No pallor Neuro: Alert and oriented 4 Cardiovascular: Normal rate Respiratory: Normal effort and rate Abdomen: Soft, nontender Gynecological Exam: examination not indicated. Self swab collected  Labs: Results for orders placed or performed during the hospital encounter of 03/19/24 (from the past 24 hours)  hCG, quantitative, pregnancy   Collection Time: 03/19/24  3:50 PM  Result Value Ref Range   hCG, Beta Chain, Quant, S 1,553 (H) <5 mIU/mL  Comprehensive metabolic panel   Collection Time: 03/19/24  3:50 PM  Result Value Ref Range   Sodium 136 135 - 145 mmol/L   Potassium 3.3 (L) 3.5 - 5.1 mmol/L   Chloride 101 98 - 111 mmol/L   CO2 25 22 - 32 mmol/L   Glucose, Bld 88 70 - 99 mg/dL   BUN 8 6 - 20 mg/dL   Creatinine, Ser 4.78 0.44 - 1.00 mg/dL   Calcium  8.8 (L) 8.9 - 10.3 mg/dL   Total Protein 6.9 6.5 - 8.1 g/dL   Albumin 3.5 3.5 - 5.0 g/dL   AST 17 15 - 41 U/L   ALT 14 0 -  44 U/L   Alkaline Phosphatase 43 38 - 126 U/L   Total Bilirubin 0.5 0.0 - 1.2 mg/dL   GFR, Estimated >29 >56 mL/min   Anion gap 10 5 - 15  CBC   Collection Time: 03/19/24  3:50 PM  Result Value Ref Range   WBC 7.4 4.0 - 10.5 K/uL   RBC 3.67 (L) 3.87 - 5.11 MIL/uL   Hemoglobin 11.4 (L) 12.0 - 15.0 g/dL   HCT 21.3 (L) 08.6 - 57.8 %   MCV 94.6 80.0 - 100.0 fL   MCH 31.1 26.0 - 34.0 pg   MCHC 32.9 30.0 - 36.0 g/dL   RDW 46.9 62.9 - 52.8 %   Platelets 367 150 - 400 K/uL   nRBC 0.0 0.0 - 0.2 %  Rh IG workup (includes ABO/Rh)   Collection Time: 03/19/24  3:50 PM  Result Value Ref Range   Gestational Age(Wks) 5    Antibody Screen NEG    ABO/RH(D) A NEG    Unit Number [id number]    Blood Component Type RHIG    Unit division 00    Status of Unit ISSUED    Transfusion Status      OK TO TRANSFUSE Performed at [facility], [address] [place],  [place] [address]   Wet prep, genital   Collection Time: 03/19/24  6:13 PM   Specimen: PATH Cytology Cervicovaginal Ancillary Only  Result Value Ref Range   Yeast Wet Prep HPF POC NONE SEEN NONE SEEN   Trich, Wet Prep NONE SEEN NONE SEEN   Clue Cells Wet Prep HPF POC PRESENT (A) NONE SEEN   WBC, Wet Prep HPF POC >=10 (A) <10   Sperm NONE SEEN     Ultrasound Studies:   US  OB LESS THAN 14 WEEKS WITH OB TRANSVAGINAL Result Date: 03/11/2024 CLINICAL DATA:  Vaginal bleeding. Assigned gestational age [redacted] weeks, 3 days. LMP 02/09/2024. EXAM: OBSTETRIC <14 WK US  AND TRANSVAGINAL OB US  TECHNIQUE: Both transabdominal and transvaginal ultrasound examinations were performed for complete evaluation of the gestation as well as the maternal uterus, adnexal regions, and pelvic cul-de-sac. Transvaginal technique was performed to assess early pregnancy. COMPARISON:  None Available. FINDINGS: Intrauterine gestational sac: None identified Maternal uterus/adnexae: The uterus is anteverted. The cervix is unremarkable. The myometrial echotexture is  coarsened diffusely though a discrete intrauterine mass is not clearly identified. The endometrium measures 6 mm in thickness. No intrauterine gestational sac or masses are identified. The maternal ovaries are unremarkable. No free fluid seen within the pelvis. IMPRESSION: Pregnancy location not visualized sonographically. Differential diagnosis includes recent spontaneous abortion, IUP too early to visualize, and non-visualized ectopic pregnancy. Recommend close follow up of quantitative B-HCG levels, and follow up US  as clinically warranted. Electronically Signed   By: Worthy Heads M.D.   On: 03/11/2024 23:44    MAU course/MDM: Orders Placed This Encounter  Procedures   Wet prep, genital   US  OB Transvaginal   hCG, quantitative, pregnancy   Comprehensive metabolic panel   CBC   Rh IG workup (includes ABO/Rh)   Discharge patient   Meds ordered this encounter  Medications   rho (d) immune globulin  (RHIG/RHOPHYLAC ) injection 300 mcg   metroNIDAZOLE  (FLAGYL ) 500 MG tablet    Sig: Take 1 tablet (500 mg total) by mouth 2 (two) times daily.    Dispense:  14 tablet    Refill:  0    Supervising Provider:   Tresia Fruit [[id number]]     Pain and bleeding in early pregnancy with confirmed IUP and hemodynamically stable. - Cramping and vaginal odor likely 2/2 BV. Rx Flagyl .  RH neg.  - Rhophylac  given  Assessment: [redacted]w[redacted]d weeks gestation w/ nml rise in Quant and IUP confirmed on US  1. Vaginal bleeding in pregnancy, first trimester   2. Abdominal pain during pregnancy in first trimester   3. Bacterial vaginosis   4. Rh negative state in antepartum period, first trimester   5. BV (bacterial vaginosis)   6. Normal IUP (intrauterine pregnancy) on prenatal ultrasound, first trimester     Plan: Discharge home in stable condition. SAB  precautions  Follow-up Information     [facility] Follow up.   Specialty: Obstetrics and  Gynecology Contact information: [address] [place]  [id number] [phone]        [facility] Follow up.   Specialty: Obstetrics and Gynecology Why: As needed in emergencies Contact information: [address] [place]  [phone]               Allergies as of 03/19/2024       Reactions   Benadryl  [diphenhydramine ] Itching   Pt reports being itchy after receiving benadryl  through IV  Medication List     TAKE these medications    metroNIDAZOLE  500 MG tablet Commonly known as: FLAGYL  Take 1 tablet (500 mg total) by mouth 2 (two) times daily.        Felipe Horton, Ashland Osmer , CNM 03/19/2024, 7:13 PM  2/3

## 2024-03-20 ENCOUNTER — Other Ambulatory Visit: Payer: Self-pay | Admitting: Advanced Practice Midwife

## 2024-03-20 ENCOUNTER — Encounter: Payer: Self-pay | Admitting: Advanced Practice Midwife

## 2024-03-20 DIAGNOSIS — Z8679 Personal history of other diseases of the circulatory system: Secondary | ICD-10-CM

## 2024-03-20 DIAGNOSIS — Z3A01 Less than 8 weeks gestation of pregnancy: Secondary | ICD-10-CM

## 2024-03-20 LAB — RH IG WORKUP (INCLUDES ABO/RH)
ABO/RH(D): A NEG
Antibody Screen: NEGATIVE
Gestational Age(Wks): 5
Unit division: 0

## 2024-03-20 MED ORDER — NIFEDIPINE ER OSMOTIC RELEASE 30 MG PO TB24
30.0000 mg | ORAL_TABLET | Freq: Every day | ORAL | 6 refills | Status: DC
Start: 2024-03-20 — End: 2024-04-20

## 2024-03-21 LAB — GC/CHLAMYDIA PROBE AMP (~~LOC~~) NOT AT ARMC
Chlamydia: NEGATIVE
Comment: NEGATIVE
Comment: NORMAL
Neisseria Gonorrhea: NEGATIVE

## 2024-04-07 ENCOUNTER — Encounter (HOSPITAL_COMMUNITY): Payer: Self-pay | Admitting: Obstetrics and Gynecology

## 2024-04-07 ENCOUNTER — Inpatient Hospital Stay (HOSPITAL_COMMUNITY): Payer: MEDICAID

## 2024-04-07 ENCOUNTER — Inpatient Hospital Stay (HOSPITAL_COMMUNITY)
Admission: AD | Admit: 2024-04-07 | Discharge: 2024-04-07 | Disposition: A | Discharge status: Home / Self Care | Payer: MEDICAID | Attending: Obstetrics and Gynecology | Admitting: Obstetrics and Gynecology

## 2024-04-07 DIAGNOSIS — O09521 Supervision of elderly multigravida, first trimester: Secondary | ICD-10-CM | POA: Diagnosis not present

## 2024-04-07 DIAGNOSIS — O99611 Diseases of the digestive system complicating pregnancy, first trimester: Secondary | ICD-10-CM | POA: Diagnosis not present

## 2024-04-07 DIAGNOSIS — R1030 Lower abdominal pain, unspecified: Secondary | ICD-10-CM | POA: Insufficient documentation

## 2024-04-07 DIAGNOSIS — K5900 Constipation, unspecified: Secondary | ICD-10-CM

## 2024-04-07 DIAGNOSIS — Z711 Person with feared health complaint in whom no diagnosis is made: Secondary | ICD-10-CM

## 2024-04-07 DIAGNOSIS — Z3A08 8 weeks gestation of pregnancy: Secondary | ICD-10-CM | POA: Diagnosis not present

## 2024-04-07 DIAGNOSIS — N939 Abnormal uterine and vaginal bleeding, unspecified: Secondary | ICD-10-CM | POA: Diagnosis not present

## 2024-04-07 DIAGNOSIS — O26891 Other specified pregnancy related conditions, first trimester: Secondary | ICD-10-CM

## 2024-04-07 DIAGNOSIS — O26851 Spotting complicating pregnancy, first trimester: Secondary | ICD-10-CM | POA: Insufficient documentation

## 2024-04-07 LAB — URINALYSIS, ROUTINE W REFLEX MICROSCOPIC
Bilirubin Urine: NEGATIVE
Glucose, UA: NEGATIVE mg/dL
Hgb urine dipstick: NEGATIVE
Ketones, ur: NEGATIVE mg/dL
Leukocytes,Ua: NEGATIVE
Nitrite: NEGATIVE
Protein, ur: NEGATIVE mg/dL
Specific Gravity, Urine: 1.025 (ref 1.005–1.030)
pH: 6 (ref 5.0–8.0)

## 2024-04-07 LAB — HCG, QUANTITATIVE, PREGNANCY: hCG, Beta Chain, Quant, S: 53783 m[IU]/mL — ABNORMAL HIGH (ref <5)

## 2024-04-07 LAB — WET PREP, GENITAL
Sperm: NONE SEEN
Trich, Wet Prep: NONE SEEN
WBC, Wet Prep HPF POC: <10 (ref <10)
Yeast Wet Prep HPF POC: NONE SEEN

## 2024-04-07 MED ORDER — METRONIDAZOLE 500 MG PO TABS: 500.0000 mg | ORAL_TABLET | Freq: Two times a day (BID) | ORAL | 0 refills | Status: DC

## 2024-04-07 NOTE — MAU Provider Note (Signed)
 History     CSN: [id number]  Arrival date and time: 04/07/24 1530   None     Chief Complaint  Patient presents with   Abdominal Pain   Vaginal Bleeding   [name] [name] , a  38 y.o. [phone] at [redacted]w[redacted]d presents to MAU via EMS with complaints of vaginal bleeding and lower abdominal cramping. Patient reports 1 incident of mild to light spotting that only occurred with wiping. She endorses passing 1 small dime sized clot at that time but denies wearing or saturating a pad. Her FOB called 911 and patient came at her FOB recommendation. She reports having "more bleeding last week, but was not worried." At this time she denies any pain or bleeding.          OB History     Gravida  9   Para  8   Term  7   Preterm  1   AB  0   Living  9      SAB  0   IAB  0   Ectopic  0   Multiple  1   Live Births  9           Past Medical History:  Diagnosis Date   Abnormal Pap smear    f/u was normal   Chronic hypertension    COVID-19 10/21/2019   10/16/19   DEPRESSION, MAJOR, RECURRENT, MODERATE 04/18/2010   Qualifier: Diagnosis of  By: Alease Hunter MD, Berle Breeding)    Herpes simplex virus (HSV) infection    History of abnormal Pap smear 07/28/2012   History of preterm delivery 11/04/2018   Infection    trich, chlamydia, gonorrhea   Ovarian cyst    PTSD (post-traumatic stress disorder)    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   Urinary tract infection     Past Surgical History:  Procedure Laterality Date   CESAREAN SECTION     CESAREAN SECTION N/A 09/23/2018   Procedure: CESAREAN SECTION;  Surgeon: Verlyn Goad, MD;  Location: [facility];  Service: Obstetrics;  Laterality: N/A;   CHOLECYSTECTOMY N/A 05/10/2020   Procedure: LAPAROSCOPIC CHOLECYSTECTOMY;  Surgeon: Shela Derby, MD;  Location: [facility] OR;  Service: General;  Laterality: N/A;   TEE WITHOUT CARDIOVERSION N/A 12/05/2019   Procedure: TRANSESOPHAGEAL  ECHOCARDIOGRAM (TEE);  Surgeon: Wendie Hamburg, MD;  Location: [facility] ENDOSCOPY;  Service: Endoscopy;  Laterality: N/A;   TONSILLECTOMY     WISDOM TOOTH EXTRACTION      Family History  Problem Relation Age of Onset   Hypertension Mother    Arthritis Mother    Hypertension Father    Asthma Brother    Cancer Maternal Uncle        lung   Diabetes Maternal Grandmother    Cancer Maternal Grandmother        thyroid    Hypertension Maternal Grandmother    Other Neg Hx     Social History   Tobacco Use   Smoking status: Never   Smokeless tobacco: Never  Vaping Use   Vaping status: Never Used  Substance Use Topics   Alcohol use: Not Currently    Alcohol/week: 2.0 standard drinks of alcohol    Types: 1 Glasses of wine, 1 Shots of liquor per week    Comment: Not currently. Last use May 2020   Drug use: Not Currently    Types: Marijuana    Allergies:  Allergies  Allergen Reactions   Benadryl  [Diphenhydramine ]  Itching    Pt reports being itchy after receiving benadryl  through IV    Medications Prior to Admission  Medication Sig Dispense Refill Last Dose/Taking   metroNIDAZOLE  (FLAGYL ) 500 MG tablet Take 1 tablet (500 mg total) by mouth 2 (two) times daily. 14 tablet 0    NIFEdipine  (PROCARDIA -XL/NIFEDICAL-XL) 30 MG 24 hr tablet Take 1 tablet (30 mg total) by mouth daily. Can increase to twice a day as needed for symptomatic contractions 30 tablet 6     Review of Systems  Constitutional:  Negative for chills, fatigue and fever.  Eyes:  Negative for pain and visual disturbance.  Respiratory:  Negative for apnea, shortness of breath and wheezing.   Cardiovascular:  Negative for chest pain and palpitations.  Gastrointestinal:  Positive for abdominal pain. Negative for constipation, diarrhea, nausea and vomiting.  Genitourinary:  Positive for pelvic pain and vaginal bleeding. Negative for difficulty urinating, dysuria, vaginal discharge and vaginal pain.  Musculoskeletal:   Negative for back pain.  Neurological:  Negative for seizures, weakness and headaches.  Psychiatric/Behavioral:  Negative for suicidal ideas.    Physical Exam   Blood pressure 124/75, pulse 98, temperature 98.3 F (36.8 C), temperature source Oral, resp. rate 15, height 5\' 2"  (1.575 m), weight 130.1 kg, last menstrual period 02/09/2024, SpO2 99%, currently breastfeeding.  Physical Exam Vitals and nursing note reviewed.  Constitutional:      General: She is not in acute distress.    Appearance: Normal appearance.  HENT:     Head: Normocephalic.  Pulmonary:     Effort: Pulmonary effort is normal.  Musculoskeletal:     Cervical back: Normal range of motion.  Skin:    General: Skin is warm and dry.  Neurological:     Mental Status: She is alert and oriented to person, place, and time.  Psychiatric:        Mood and Affect: Mood normal.     MAU Course  Procedures Orders Placed This Encounter  Procedures   Wet prep, genital   US  OB Transvaginal   Urinalysis, Routine w reflex microscopic -Urine, Clean Catch   hCG, quantitative, pregnancy   Discharge patient Discharge disposition: 01-Home or Self Care; Discharge patient date: 04/07/2024   Results for orders placed or performed during the hospital encounter of 04/07/24 (from the past 24 hours)  Urinalysis, Routine w reflex microscopic -Urine, Clean Catch     Status: Abnormal   Collection Time: 04/07/24  4:03 PM  Result Value Ref Range   Color, Urine YELLOW YELLOW   APPearance HAZY (A) CLEAR   Specific Gravity, Urine 1.025 1.005 - 1.030   pH 6.0 5.0 - 8.0   Glucose, UA NEGATIVE NEGATIVE mg/dL   Hgb urine dipstick NEGATIVE NEGATIVE   Bilirubin Urine NEGATIVE NEGATIVE   Ketones, ur NEGATIVE NEGATIVE mg/dL   Protein, ur NEGATIVE NEGATIVE mg/dL   Nitrite NEGATIVE NEGATIVE   Leukocytes,Ua NEGATIVE NEGATIVE  Wet prep, genital     Status: Abnormal   Collection Time: 04/07/24  4:05 PM   Specimen: PATH Cytology Cervicovaginal  Ancillary Only  Result Value Ref Range   Yeast Wet Prep HPF POC NONE SEEN NONE SEEN   Trich, Wet Prep NONE SEEN NONE SEEN   Clue Cells Wet Prep HPF POC PRESENT (A) NONE SEEN   WBC, Wet Prep HPF POC <10 <10   Sperm NONE SEEN   hCG, quantitative, pregnancy     Status: Abnormal   Collection Time: 04/07/24  4:43 PM  Result Value Ref  Range   hCG, Beta Chain, Quant, S 53,783 (H) <5 mIU/mL  US  OB Transvaginal Result Date: 04/07/2024 CLINICAL DATA:  Vaginal bleeding, cramping EXAM: TRANSVAGINAL OB ULTRASOUND TECHNIQUE: Transvaginal ultrasound was performed for complete evaluation of the gestation as well as the maternal uterus, adnexal regions, and pelvic cul-de-sac. COMPARISON:  None Available. FINDINGS: Intrauterine gestational sac: Single Yolk sac:  Visualized. Embryo:  Visualized. Cardiac Activity: Visualized. Heart Rate: 176 bpm MSD:   mm    w     d CRL:   21.3 mm   8 w 5 d                  US  EDC: 11/12/2024 Subchorionic hemorrhage:  None visualized. Maternal uterus/adnexae: No adnexal mass or free fluid. IMPRESSION: Eight week 5 day intrauterine pregnancy. Fetal heart rate 176 beats per minute. No acute maternal findings. Electronically Signed   By: Janeece Mechanic M.D.   On: 04/07/2024 18:34      MDM - Patient previously with a IUP, sent for formal d/t habitus.  - Appropriate rise on Quant from 2 weeks ago.  - Patient was recently treated for BV and reports recently finishing treatment.  - UA hazy but otherwise normal  - GC pending upon discharge.  - US  results revealed a single living IUP with cardiac activity.  - plan for discharge   Assessment and Plan   1. Vaginal bleeding   2. Constipation, unspecified constipation type   3. [redacted] weeks gestation of pregnancy   4. Physically well but worried    - Reviewed worsening signs and return precautions.  - Recommended to follow up with OB facility of her choosing,  - Reviewed meds safe in pregnancy for constipation.  - Patient discharged home  in stable condition and amy return to MAU as needed.   Corie Diamond, MSN CNM  04/07/2024, 6:41 PM

## 2024-04-07 NOTE — MAU Note (Signed)
 [name] [name] is a 38 y.o. at [redacted]w[redacted]d here in MAU reporting: came in by EMS.  Started bleeding and cramping around 2. Passed a nickel sized clots, saw blood when she wiped.  No pad on. Pain in lower abd.  Hx of bleeding in preg.  Confirmed IUP on 4/19. Onset of complaint: 1400 Pain score: moderate Vitals:   04/07/24 1545  BP: 124/75  Pulse: 98  Resp: 15  Temp: 98.3 F (36.8 C)  SpO2: 99%      Lab orders placed from triage:

## 2024-04-08 LAB — GC/CHLAMYDIA PROBE AMP (~~LOC~~) NOT AT ARMC
Chlamydia: NEGATIVE
Comment: NEGATIVE
Comment: NORMAL
Neisseria Gonorrhea: NEGATIVE

## 2024-04-11 ENCOUNTER — Encounter: Payer: Self-pay | Admitting: Family Medicine

## 2024-04-20 ENCOUNTER — Telehealth: Payer: MEDICAID

## 2024-04-20 ENCOUNTER — Other Ambulatory Visit: Payer: Self-pay | Admitting: Obstetrics and Gynecology

## 2024-04-20 ENCOUNTER — Encounter: Payer: Self-pay | Admitting: Obstetrics and Gynecology

## 2024-04-20 DIAGNOSIS — Z98891 History of uterine scar from previous surgery: Secondary | ICD-10-CM

## 2024-04-20 DIAGNOSIS — O09211 Supervision of pregnancy with history of pre-term labor, first trimester: Secondary | ICD-10-CM | POA: Diagnosis not present

## 2024-04-20 DIAGNOSIS — Z3A10 10 weeks gestation of pregnancy: Secondary | ICD-10-CM | POA: Diagnosis not present

## 2024-04-20 DIAGNOSIS — O0991 Supervision of high risk pregnancy, unspecified, first trimester: Secondary | ICD-10-CM | POA: Diagnosis not present

## 2024-04-20 DIAGNOSIS — O09291 Supervision of pregnancy with other poor reproductive or obstetric history, first trimester: Secondary | ICD-10-CM | POA: Diagnosis not present

## 2024-04-20 DIAGNOSIS — I10 Essential (primary) hypertension: Secondary | ICD-10-CM

## 2024-04-20 DIAGNOSIS — Z8679 Personal history of other diseases of the circulatory system: Secondary | ICD-10-CM

## 2024-04-20 DIAGNOSIS — Z8751 Personal history of pre-term labor: Secondary | ICD-10-CM

## 2024-04-20 DIAGNOSIS — O0990 Supervision of high risk pregnancy, unspecified, unspecified trimester: Secondary | ICD-10-CM | POA: Insufficient documentation

## 2024-04-20 MED ORDER — PRENATAL PLUS 27-1 MG PO TABS: 1.0000 | ORAL_TABLET | Freq: Every day | ORAL | 11 refills | Status: AC

## 2024-04-20 MED ORDER — BLOOD PRESSURE MONITORING DEVI: 1.0000 | 0 refills | Status: AC

## 2024-04-20 MED ORDER — NIFEDIPINE ER OSMOTIC RELEASE 30 MG PO TB24: 30.0000 mg | ORAL_TABLET | Freq: Every day | ORAL | 6 refills | Status: AC
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
## 2024-04-20 NOTE — Progress Notes (Signed)
 Chart review and updating med/problem list.

## 2024-04-21 DIAGNOSIS — Z98891 History of uterine scar from previous surgery: Secondary | ICD-10-CM | POA: Insufficient documentation
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
## 2024-04-26 ENCOUNTER — Other Ambulatory Visit (HOSPITAL_COMMUNITY)
Admission: RE | Admit: 2024-04-26 | Discharge: 2024-04-26 | Disposition: A | Discharge status: Home / Self Care | Payer: MEDICAID | Source: Ambulatory Visit | Attending: Obstetrics and Gynecology | Admitting: Obstetrics and Gynecology

## 2024-04-26 ENCOUNTER — Encounter: Payer: Self-pay | Admitting: Obstetrics and Gynecology

## 2024-04-26 ENCOUNTER — Ambulatory Visit: Payer: MEDICAID | Admitting: Obstetrics and Gynecology

## 2024-04-26 VITALS — BP 140/85 | HR 86 | Wt 288.0 lb

## 2024-04-26 DIAGNOSIS — R0681 Apnea, not elsewhere classified: Secondary | ICD-10-CM

## 2024-04-26 DIAGNOSIS — I10 Essential (primary) hypertension: Secondary | ICD-10-CM

## 2024-04-26 DIAGNOSIS — D151 Benign neoplasm of heart: Secondary | ICD-10-CM | POA: Diagnosis not present

## 2024-04-26 DIAGNOSIS — Z8659 Personal history of other mental and behavioral disorders: Secondary | ICD-10-CM

## 2024-04-26 DIAGNOSIS — Z98891 History of uterine scar from previous surgery: Secondary | ICD-10-CM

## 2024-04-26 DIAGNOSIS — Z6791 Unspecified blood type, Rh negative: Secondary | ICD-10-CM

## 2024-04-26 DIAGNOSIS — O0990 Supervision of high risk pregnancy, unspecified, unspecified trimester: Secondary | ICD-10-CM

## 2024-04-26 DIAGNOSIS — Z87898 Personal history of other specified conditions: Secondary | ICD-10-CM

## 2024-04-26 DIAGNOSIS — O09529 Supervision of elderly multigravida, unspecified trimester: Secondary | ICD-10-CM | POA: Insufficient documentation

## 2024-04-26 DIAGNOSIS — O09521 Supervision of elderly multigravida, first trimester: Secondary | ICD-10-CM

## 2024-04-26 DIAGNOSIS — O0991 Supervision of high risk pregnancy, unspecified, first trimester: Secondary | ICD-10-CM | POA: Diagnosis not present

## 2024-04-26 DIAGNOSIS — O26891 Other specified pregnancy related conditions, first trimester: Secondary | ICD-10-CM

## 2024-04-26 DIAGNOSIS — Z8619 Personal history of other infectious and parasitic diseases: Secondary | ICD-10-CM

## 2024-04-26 DIAGNOSIS — Z3A10 10 weeks gestation of pregnancy: Secondary | ICD-10-CM

## 2024-04-26 MED ORDER — ASPIRIN 81 MG PO TBEC: 162.0000 mg | DELAYED_RELEASE_TABLET | Freq: Every day | ORAL | 12 refills | Status: DC

## 2024-04-26 MED ORDER — FAMOTIDINE 20 MG PO TABS: 20.0000 mg | ORAL_TABLET | Freq: Two times a day (BID) | ORAL | 0 refills | Status: AC

## 2024-04-26 MED ORDER — ASPIRIN 81 MG PO TBEC: 162.0000 mg | DELAYED_RELEASE_TABLET | Freq: Every day | ORAL | 12 refills | Status: AC

## 2024-04-26 MED ORDER — FAMOTIDINE 20 MG PO TABS: 20.0000 mg | ORAL_TABLET | Freq: Two times a day (BID) | ORAL | 0 refills | Status: DC

## 2024-04-26 NOTE — Patient Instructions (Signed)
Our practice his participating in a study that provides no-cost doula care. ACURE4Moms is a study looking at how doula care can reduce birthing disparities for Black and brown birthing people. We like to refer patients as soon as possible, but definitely before 28 weeks so patients can get to know their doula.    A doula is trained to provide support before, during and just after you give birth. While doulas do not provide medical care, they do provide emotional, physical and educational support. Doulas can help reduce your stress and comfort you and your partner. They can help you cope with labor by helping you use breathing techniques, massage, creative labor positioning, essential oils and affirmations.   ACURE4Moms is a research study trying to reduce:   low birthweight babies  emergency department visits & hospitalizations for birthing persons and their babies  depression among birthing people  discrimination in pregnancy-related care ACURE4Moms is trying out 2 programs designed by  people who have given birth. These programs include: 1. Sharing patient data and warning alerts with clinic staff to keep them accountable for their patients' outcomes and providing tools to help them  reduce bias in care. 2. Matching eligible patients with doulas from the  same community as the patients.  If you would like to participate in this study, please visit:   [email or web address]

## 2024-04-27 ENCOUNTER — Telehealth: Payer: Self-pay

## 2024-04-27 DIAGNOSIS — Z006 Encounter for examination for normal comparison and control in clinical research program: Secondary | ICD-10-CM

## 2024-04-27 LAB — COMPREHENSIVE METABOLIC PANEL WITH GFR
ALT: 11 IU/L (ref 0–32)
AST: 12 IU/L (ref 0–40)
Albumin: 4 g/dL (ref 3.9–4.9)
Alkaline Phosphatase: 56 IU/L (ref 44–121)
BUN/Creatinine Ratio: 25 — ABNORMAL HIGH (ref 9–23)
BUN: 14 mg/dL (ref 6–20)
Bilirubin Total: <0.2 mg/dL (ref 0.0–1.2)
CO2: 21 mmol/L (ref 20–29)
Calcium: 9.2 mg/dL (ref 8.7–10.2)
Chloride: 102 mmol/L (ref 96–106)
Creatinine, Ser: 0.55 mg/dL — ABNORMAL LOW (ref 0.57–1.00)
Globulin, Total: 2.9 g/dL (ref 1.5–4.5)
Glucose: 91 mg/dL (ref 70–99)
Potassium: 4.2 mmol/L (ref 3.5–5.2)
Sodium: 138 mmol/L (ref 134–144)
Total Protein: 6.9 g/dL (ref 6.0–8.5)
eGFR: 122 mL/min/{1.73_m2} (ref >59)

## 2024-04-27 LAB — CBC/D/PLT+RPR+RH+ABO+RUBIGG...
Antibody Screen: NEGATIVE
Basophils Absolute: 0 10*3/uL (ref 0.0–0.2)
Basos: 0 %
EOS (ABSOLUTE): 0.1 10*3/uL (ref 0.0–0.4)
Eos: 2 %
HCV Ab: NONREACTIVE
HIV Screen 4th Generation wRfx: NONREACTIVE
Hematocrit: 36.4 % (ref 34.0–46.6)
Hemoglobin: 11.6 g/dL (ref 11.1–15.9)
Hepatitis B Surface Ag: NEGATIVE
Immature Grans (Abs): 0 10*3/uL (ref 0.0–0.1)
Immature Granulocytes: 0 %
Lymphocytes Absolute: 2.4 10*3/uL (ref 0.7–3.1)
Lymphs: 29 %
MCH: 30.9 pg (ref 26.6–33.0)
MCHC: 31.9 g/dL (ref 31.5–35.7)
MCV: 97 fL (ref 79–97)
Monocytes Absolute: 0.3 10*3/uL (ref 0.1–0.9)
Monocytes: 4 %
Neutrophils Absolute: 5.3 10*3/uL (ref 1.4–7.0)
Neutrophils: 65 %
Platelets: 331 10*3/uL (ref 150–450)
RBC: 3.75 x10E6/uL — ABNORMAL LOW (ref 3.77–5.28)
RDW: 12.3 % (ref 11.7–15.4)
RPR Ser Ql: NONREACTIVE
Rh Factor: NEGATIVE
Rubella Antibodies, IGG: 1.7 {index} (ref >0.99)
WBC: 8.3 10*3/uL (ref 3.4–10.8)

## 2024-04-27 LAB — HEMOGLOBIN A1C
Est. average glucose Bld gHb Est-mCnc: 97 mg/dL
Hgb A1c MFr Bld: 5 % (ref 4.8–5.6)

## 2024-04-27 LAB — HCV INTERPRETATION

## 2024-04-27 LAB — HSV 1 AND 2 AB, IGG
HSV 1 Glycoprotein G Ab, IgG: REACTIVE — AB
HSV 2 IgG, Type Spec: REACTIVE — AB

## 2024-04-27 NOTE — Telephone Encounter (Signed)
 Called pt regarding Impact Mom Trial. Pt did not answer. I left a voicemail  with the nature of my call and my contact information.

## 2024-04-28 ENCOUNTER — Ambulatory Visit: Payer: Self-pay | Admitting: Obstetrics and Gynecology

## 2024-04-28 LAB — PROTEIN / CREATININE RATIO, URINE
Creatinine, Urine: 127.9 mg/dL
Protein, Ur: 10 mg/dL
Protein/Creat Ratio: 78 mg/g{creat} (ref 0–200)

## 2024-04-29 LAB — CYTOLOGY - PAP
Chlamydia: NEGATIVE
Comment: NEGATIVE
Comment: NEGATIVE
Comment: NORMAL
Diagnosis: NEGATIVE
High risk HPV: NEGATIVE
Neisseria Gonorrhea: NEGATIVE

## 2024-05-02 LAB — PANORAMA PRENATAL TEST FULL PANEL:PANORAMA TEST PLUS 5 ADDITIONAL MICRODELETIONS: FETAL FRACTION: 2.6

## 2024-05-25 ENCOUNTER — Encounter: Payer: Self-pay | Admitting: *Deleted

## 2024-05-27 ENCOUNTER — Encounter: Payer: Self-pay | Admitting: Obstetrics and Gynecology

## 2024-05-27 ENCOUNTER — Encounter: Payer: MEDICAID | Admitting: Obstetrics and Gynecology

## 2024-05-30 NOTE — Progress Notes (Signed)
 Patient did not keep her OB appointment for 05/27/2024.  Bebe Izell Raddle MD Attending Center for [employer] [facility])

## 2024-05-31 ENCOUNTER — Encounter: Payer: MEDICAID | Admitting: Family Medicine

## 2024-06-15 ENCOUNTER — Other Ambulatory Visit: Payer: Self-pay | Admitting: Family Medicine

## 2024-06-15 DIAGNOSIS — D151 Benign neoplasm of heart: Secondary | ICD-10-CM

## 2024-06-15 DIAGNOSIS — I10 Essential (primary) hypertension: Secondary | ICD-10-CM

## 2024-06-17 DIAGNOSIS — O99210 Obesity complicating pregnancy, unspecified trimester: Secondary | ICD-10-CM | POA: Insufficient documentation

## 2024-06-29 ENCOUNTER — Ambulatory Visit: Payer: MEDICAID

## 2024-06-29 ENCOUNTER — Other Ambulatory Visit: Payer: MEDICAID

## 2024-06-29 DIAGNOSIS — O99210 Obesity complicating pregnancy, unspecified trimester: Secondary | ICD-10-CM

## 2024-06-29 DIAGNOSIS — O0990 Supervision of high risk pregnancy, unspecified, unspecified trimester: Secondary | ICD-10-CM

## 2024-06-29 DIAGNOSIS — O09522 Supervision of elderly multigravida, second trimester: Secondary | ICD-10-CM

## 2024-08-17 ENCOUNTER — Ambulatory Visit: Payer: MEDICAID

## 2024-08-17 ENCOUNTER — Other Ambulatory Visit: Payer: MEDICAID

## 2024-08-26 ENCOUNTER — Ambulatory Visit: Payer: MEDICAID | Admitting: Cardiology

## 2024-08-29 ENCOUNTER — Telehealth: Payer: Self-pay | Admitting: Family Medicine

## 2024-08-29 ENCOUNTER — Encounter: Payer: MEDICAID | Admitting: Obstetrics and Gynecology

## 2024-08-29 NOTE — Assessment & Plan Note (Deleted)
[ ]   Rhogam at 28w

## 2024-08-29 NOTE — Telephone Encounter (Signed)
 Called the patient to confirm her visit with us  at 2:35. Patient didn't answer so I left a voice message asking to give us  a call to confirm her visit.

## 2024-08-29 NOTE — Assessment & Plan Note (Deleted)
 Dx 2022: Mass in right atrium, Myxoma per Cardiology to see CT surgery >> Did not follow up [ ]  [facility] referral: [facility] called x3 with no answer and closed referral

## 2024-08-29 NOTE — Progress Notes (Deleted)
 PRENATAL VISIT NOTE  Subjective:  [name] [name] is a 38 y.o. [phone] at [redacted]w[redacted]d being seen today for ongoing prenatal care.  She is currently monitored for the following issues for this high-risk pregnancy and has ATTENTION DEFICIT, W/HYPERACTIVITY; MENTAL RETARDATION, MILD; History of VBAC; Chronic hypertension; History of depression; Rh negative state in antepartum period; Atrial myxoma; History of herpes genitalis; Supervision of high risk pregnancy, antepartum; History of cesarean delivery; AMA (advanced maternal age) multigravida 35+; and Obesity affecting pregnancy, antepartum on their problem list.  Patient reports {sx:14538}.   .  .   . Denies leaking of fluid.   The following portions of the patient's history were reviewed and updated as appropriate: allergies, current medications, past family history, past medical history, past social history, past surgical history and problem list.   Objective:    There were no vitals filed for this visit.  Fetal Status:           General: Alert, oriented and cooperative. Patient is in no acute distress.  Skin: Skin is warm and dry. No rash noted.   Cardiovascular: Normal heart rate noted  Respiratory: Normal respiratory effort, no problems with respiration noted  Abdomen: Soft, gravid, appropriate for gestational age.        Pelvic: Cervical exam deferred        Extremities: Normal range of motion.     Mental Status: Normal mood and affect. Normal behavior. Normal judgment and thought content.   Assessment and Plan:  Pregnancy: [id number] at [redacted]w[redacted]d 1. Supervision of high risk pregnancy, antepartum (Primary) Needs 28w labs Needs Anatomy US  ASAP.  Offered flu and tdap - pt ***  2. Chronic hypertension Procardia  30 XL Aspirin  162 mg  Baseline labs show elevated proteinuria of 409. Blood work normal.  BP today ***  3. Atrial myxoma Has not yet follow up with [facility]. Discussed with   4. Pregnancy with 28 completed weeks gestation  5.  Rh negative state in antepartum period Rhogam today ***  6. Obesity affecting pregnancy, antepartum, unspecified obesity type  7. History of VBAC - We discussed her history of c-section. Her previous c-section was due to  NRFHT for her first and then her second was for twins.  She has a history of  successful vaginal delivery - We discussed the risks associated with repeat c-section: bleeding, infection, injury to surrounding organs/tissues I.e. bowel/bladder, development of scar tissue, wound complications such as wound separation or infection, need for additional surgery, percreta/acreta - We discussed the risks associated with TOLAC: risk of it being unsuccessful, specially in the context of her history, the risks in general of a vaginal delivery (prolapse, SUI, differences in recovery, pelvic floor dysfunction, etc), and the risk of uterine rupture. We discussed with the risk of uterine rupture that while rare it is not easily predicted, that it is a surgical emergency, and it can be potentially catastrophic for mom and baby. We discussed if uterine rupture that it may necessitate hysterectomy if the rupture caused issues with bleeding that could not be managed with other surgical options.  - After counseling, the patient was given the opportunity to ask questions and all questions answered.  - After considering her options, she would like {Blank single:19197::to TOLAC,to have a repeat c-section,take some time to consider the options} - Copy of consent provided to the patient   8. History of depression ***  Preterm labor symptoms and general obstetric precautions including but not limited to vaginal bleeding, contractions, leaking of  fluid and fetal movement were reviewed in detail with the patient. Please refer to After Visit Summary for other counseling recommendations.   No follow-ups on file.  Future Appointments  Date Time Provider Department Center  08/29/2024  2:35 PM Cleatus Moccasin, MD [facility]    Moccasin Cleatus, MD

## 2024-08-29 NOTE — Assessment & Plan Note (Deleted)
 History of 2 prior c-sections. Desires TOLAC.  Sign tolac consent

## 2024-08-29 NOTE — Assessment & Plan Note (Deleted)
 Procardia  30 XL Aspirin  162 mg  Baseline labs show elevated proteinuria of 409. Blood work normal.

## 2024-09-02 ENCOUNTER — Telehealth: Payer: Self-pay | Admitting: Family Medicine

## 2024-09-02 NOTE — Telephone Encounter (Signed)
 Called patient about scheduling prenatal appt asap. Left VM with office number for patient to call and schedule an appointment.

## 2024-09-06 ENCOUNTER — Encounter: Payer: Self-pay | Admitting: Family Medicine

## 2024-09-06 ENCOUNTER — Encounter: Payer: MEDICAID | Admitting: Family Medicine

## 2024-09-06 NOTE — Progress Notes (Signed)
Patient did not keep appointment today. She will be called to reschedule.  

## 2024-09-22 ENCOUNTER — Telehealth: Payer: Self-pay | Admitting: Family Medicine

## 2024-09-22 ENCOUNTER — Encounter: Payer: MEDICAID | Admitting: Obstetrics & Gynecology

## 2024-09-22 NOTE — Telephone Encounter (Signed)
 Called patient today to reschedule her OB visit after she has no showed several times but was not able to get her on the line. The call could not be completed so I was not able to leave her a message either.

## 2024-10-18 ENCOUNTER — Encounter: Payer: MEDICAID | Admitting: Physician Assistant

## 2024-10-18 NOTE — Progress Notes (Unsigned)
 Patient did not show for visit 10/18/24

## 2024-10-18 NOTE — Progress Notes (Deleted)
 PRENATAL VISIT NOTE  Subjective:  [name] [name] is a 38 y.o. [phone] at [redacted]w[redacted]d being seen today for ongoing prenatal care.  She is currently monitored for the following issues for this {Blank single:19197::high-risk,low-risk} pregnancy and has ATTENTION DEFICIT, W/HYPERACTIVITY; MENTAL RETARDATION, MILD; History of VBAC; Chronic hypertension; History of depression; Rh negative state in antepartum period; Atrial myxoma; History of herpes genitalis; Supervision of high risk pregnancy, antepartum; History of cesarean delivery; AMA (advanced maternal age) multigravida 35+; and Obesity affecting pregnancy, antepartum on their problem list.  Patient reports {sx:14538}.   .  .   . Denies leaking of fluid.   The following portions of the patient's history were reviewed and updated as appropriate: allergies, current medications, past family history, past medical history, past social history, past surgical history and problem list.   Objective:   There were no vitals filed for this visit.  Fetal Status:           General: Alert, oriented and cooperative. Patient is in no acute distress.  Skin: Skin is warm and dry. No rash noted.   Cardiovascular: Normal heart rate noted  Respiratory: Normal respiratory effort, no problems with respiration noted  Abdomen: Soft, gravid, appropriate for gestational age.        Pelvic: {Blank single:19197::Cervical exam performed in the presence of a chaperone,Cervical exam deferred}        Extremities: Normal range of motion.     Mental Status: Normal mood and affect. Normal behavior. Normal judgment and thought content.      04/26/2024    2:41 PM 07/31/2021   11:10 AM 07/15/2021    9:16 AM  Depression screen PHQ 2/9  Decreased Interest 1 0 1  Down, Depressed, Hopeless 2 0 1  PHQ - 2 Score 3 0 2  Altered sleeping 3 2 3   Tired, decreased energy 3 1 3   Change in appetite 1 1 2   Feeling bad or failure about yourself  2 0 1  Trouble concentrating  0 0   Moving slowly or fidgety/restless 0 0 0  Suicidal thoughts 0 0 0  PHQ-9 Score 12  4  11       Data saved with a previous flowsheet row definition        04/26/2024    2:42 PM 07/31/2021   11:10 AM 07/15/2021    9:16 AM 09/21/2019    3:31 PM  GAD 7 : Generalized Anxiety Score  Nervous, Anxious, on Edge 2 0 1 1  Control/stop worrying 1 1 1 1   Worry too much - different things 2 1 2 1   Trouble relaxing 0 1 2 1   Restless 0 0 0 0  Easily annoyed or irritable 3 1 1 1   Afraid - awful might happen 3 1 0 0  Total GAD 7 Score 11 5 7 5     Assessment and Plan:  Pregnancy: [id number] at [redacted]w[redacted]d  1. Supervision of high risk pregnancy, antepartum (Primary) Patient doing well, feeling regular fetal movement BP, FHR, FH appropriate  2. [redacted] weeks gestation of pregnancy Anticipatory guidance about next visits/weeks of pregnancy given.   3. Chronic hypertension Normotensive on *** Baseline labs normal Continue ASA  4. Rh negative state in antepartum period ***  5. History of cesarean delivery ***  6. History of VBAC ***  7. Atrial Myxoma  8. History of herpes genitalis HSV 1 IgG and HSV-2 IgG positive   9. Multigravida of advanced maternal age in third trimester ***  10. BMI 50.0-59.9,  adult (HCC) ***   {Blank single:19197::Term,Preterm} labor symptoms and general obstetric precautions including but not limited to vaginal bleeding, contractions, leaking of fluid and fetal movement were reviewed in detail with the patient. Please refer to After Visit Summary for other counseling recommendations.   No follow-ups on file.  Future Appointments  Date Time Provider Department Center  10/18/2024  4:15 PM Omer Monter E, PA-C [facility]    Darcie Mellone E Zenas Santa, PA-C

## 2024-10-19 ENCOUNTER — Encounter: Payer: Self-pay | Admitting: *Deleted

## 2025-01-05 ENCOUNTER — Encounter: Payer: Self-pay | Admitting: Obstetrics & Gynecology
